# Patient Record
Sex: Male | Born: 1960 | Race: White | Hispanic: No | Marital: Single | Smoking: Former smoker
Health system: Southern US, Community
[De-identification: ages and names within clinical notes are randomized; demographics above are authoritative.]

## PROBLEM LIST (undated history)

## (undated) DIAGNOSIS — I10 Essential (primary) hypertension: Secondary | ICD-10-CM

## (undated) DIAGNOSIS — R7989 Other specified abnormal findings of blood chemistry: Secondary | ICD-10-CM

## (undated) DIAGNOSIS — Z9109 Other allergy status, other than to drugs and biological substances: Secondary | ICD-10-CM

## (undated) DIAGNOSIS — E785 Hyperlipidemia, unspecified: Secondary | ICD-10-CM

## (undated) DIAGNOSIS — I2510 Atherosclerotic heart disease of native coronary artery without angina pectoris: Secondary | ICD-10-CM

## (undated) DIAGNOSIS — J189 Pneumonia, unspecified organism: Secondary | ICD-10-CM

## (undated) DIAGNOSIS — F32A Depression, unspecified: Secondary | ICD-10-CM

## (undated) DIAGNOSIS — G4730 Sleep apnea, unspecified: Secondary | ICD-10-CM

## (undated) DIAGNOSIS — K219 Gastro-esophageal reflux disease without esophagitis: Secondary | ICD-10-CM

## (undated) DIAGNOSIS — N400 Enlarged prostate without lower urinary tract symptoms: Secondary | ICD-10-CM

## (undated) DIAGNOSIS — F419 Anxiety disorder, unspecified: Secondary | ICD-10-CM

## (undated) DIAGNOSIS — R0600 Dyspnea, unspecified: Secondary | ICD-10-CM

## (undated) DIAGNOSIS — F909 Attention-deficit hyperactivity disorder, unspecified type: Secondary | ICD-10-CM

## (undated) DIAGNOSIS — R609 Edema, unspecified: Secondary | ICD-10-CM

## (undated) DIAGNOSIS — I701 Atherosclerosis of renal artery: Secondary | ICD-10-CM

## (undated) DIAGNOSIS — I499 Cardiac arrhythmia, unspecified: Secondary | ICD-10-CM

## (undated) DIAGNOSIS — R51 Headache: Secondary | ICD-10-CM

## (undated) DIAGNOSIS — K589 Irritable bowel syndrome without diarrhea: Secondary | ICD-10-CM

## (undated) DIAGNOSIS — I219 Acute myocardial infarction, unspecified: Secondary | ICD-10-CM

## (undated) DIAGNOSIS — F329 Major depressive disorder, single episode, unspecified: Secondary | ICD-10-CM

## (undated) DIAGNOSIS — J3089 Other allergic rhinitis: Secondary | ICD-10-CM

## (undated) HISTORY — DX: Atherosclerotic heart disease of native coronary artery without angina pectoris: I25.10

## (undated) HISTORY — DX: Benign prostatic hyperplasia without lower urinary tract symptoms: N40.0

## (undated) HISTORY — DX: Irritable bowel syndrome, unspecified: K58.9

## (undated) HISTORY — DX: Other specified abnormal findings of blood chemistry: R79.89

## (undated) HISTORY — DX: Hyperlipidemia, unspecified: E78.5

## (undated) HISTORY — DX: Gastro-esophageal reflux disease without esophagitis: K21.9

## (undated) HISTORY — DX: Atherosclerosis of renal artery: I70.1

## (undated) HISTORY — PX: KNEE ARTHROSCOPY: SUR90

## (undated) HISTORY — DX: Edema, unspecified: R60.9

---

## 1998-07-04 ENCOUNTER — Ambulatory Visit (HOSPITAL_COMMUNITY): Admission: RE | Admit: 1998-07-04 | Discharge: 1998-07-04 | Payer: Self-pay | Admitting: Psychiatry

## 1998-09-05 ENCOUNTER — Emergency Department (HOSPITAL_COMMUNITY): Admission: EM | Admit: 1998-09-05 | Discharge: 1998-09-05 | Payer: Self-pay | Admitting: Emergency Medicine

## 1998-11-22 ENCOUNTER — Emergency Department (HOSPITAL_COMMUNITY): Admission: EM | Admit: 1998-11-22 | Discharge: 1998-11-22 | Payer: Self-pay | Admitting: *Deleted

## 1999-01-01 ENCOUNTER — Encounter: Admission: RE | Admit: 1999-01-01 | Discharge: 1999-04-01 | Payer: Self-pay

## 1999-01-07 ENCOUNTER — Encounter: Payer: Self-pay | Admitting: Neurosurgery

## 1999-01-07 ENCOUNTER — Ambulatory Visit (HOSPITAL_COMMUNITY): Admission: RE | Admit: 1999-01-07 | Discharge: 1999-01-07 | Payer: Self-pay | Admitting: Neurosurgery

## 1999-01-27 ENCOUNTER — Emergency Department (HOSPITAL_COMMUNITY): Admission: EM | Admit: 1999-01-27 | Discharge: 1999-01-27 | Payer: Self-pay | Admitting: Emergency Medicine

## 1999-02-03 ENCOUNTER — Encounter: Admission: RE | Admit: 1999-02-03 | Discharge: 1999-04-01 | Payer: Self-pay | Admitting: Neurosurgery

## 2000-10-15 ENCOUNTER — Encounter: Payer: Self-pay | Admitting: Orthopedic Surgery

## 2000-10-15 ENCOUNTER — Inpatient Hospital Stay: Admission: RE | Admit: 2000-10-15 | Discharge: 2000-10-17 | Payer: Self-pay | Admitting: Orthopedic Surgery

## 2000-10-15 ENCOUNTER — Encounter: Payer: Self-pay | Admitting: Emergency Medicine

## 2000-10-21 ENCOUNTER — Encounter: Payer: Self-pay | Admitting: Specialist

## 2000-10-21 ENCOUNTER — Ambulatory Visit (HOSPITAL_COMMUNITY): Admission: RE | Admit: 2000-10-21 | Discharge: 2000-10-21 | Payer: Self-pay | Admitting: Specialist

## 2001-02-11 ENCOUNTER — Emergency Department (HOSPITAL_COMMUNITY): Admission: EM | Admit: 2001-02-11 | Discharge: 2001-02-11 | Payer: Self-pay | Admitting: Emergency Medicine

## 2001-02-11 ENCOUNTER — Encounter: Payer: Self-pay | Admitting: Emergency Medicine

## 2001-02-12 ENCOUNTER — Emergency Department (HOSPITAL_COMMUNITY): Admission: EM | Admit: 2001-02-12 | Discharge: 2001-02-12 | Payer: Self-pay | Admitting: Emergency Medicine

## 2001-02-17 ENCOUNTER — Emergency Department (HOSPITAL_COMMUNITY): Admission: EM | Admit: 2001-02-17 | Discharge: 2001-02-17 | Payer: Self-pay | Admitting: Emergency Medicine

## 2001-02-18 ENCOUNTER — Emergency Department (HOSPITAL_COMMUNITY): Admission: EM | Admit: 2001-02-18 | Discharge: 2001-02-18 | Payer: Self-pay | Admitting: Emergency Medicine

## 2001-02-19 ENCOUNTER — Emergency Department (HOSPITAL_COMMUNITY): Admission: EM | Admit: 2001-02-19 | Discharge: 2001-02-19 | Payer: Self-pay | Admitting: Emergency Medicine

## 2001-02-20 ENCOUNTER — Emergency Department (HOSPITAL_COMMUNITY): Admission: EM | Admit: 2001-02-20 | Discharge: 2001-02-20 | Payer: Self-pay | Admitting: Emergency Medicine

## 2001-03-18 ENCOUNTER — Emergency Department (HOSPITAL_COMMUNITY): Admission: EM | Admit: 2001-03-18 | Discharge: 2001-03-18 | Payer: Self-pay | Admitting: Emergency Medicine

## 2001-04-08 ENCOUNTER — Emergency Department (HOSPITAL_COMMUNITY): Admission: EM | Admit: 2001-04-08 | Discharge: 2001-04-08 | Payer: Self-pay | Admitting: Emergency Medicine

## 2001-06-12 ENCOUNTER — Emergency Department (HOSPITAL_COMMUNITY): Admission: EM | Admit: 2001-06-12 | Discharge: 2001-06-12 | Payer: Self-pay | Admitting: Emergency Medicine

## 2001-12-05 ENCOUNTER — Emergency Department (HOSPITAL_COMMUNITY): Admission: EM | Admit: 2001-12-05 | Discharge: 2001-12-05 | Payer: Self-pay | Admitting: Emergency Medicine

## 2001-12-07 ENCOUNTER — Emergency Department (HOSPITAL_COMMUNITY): Admission: EM | Admit: 2001-12-07 | Discharge: 2001-12-07 | Payer: Self-pay | Admitting: Emergency Medicine

## 2002-07-01 ENCOUNTER — Emergency Department (HOSPITAL_COMMUNITY): Admission: EM | Admit: 2002-07-01 | Discharge: 2002-07-02 | Payer: Self-pay | Admitting: Emergency Medicine

## 2002-07-03 ENCOUNTER — Emergency Department (HOSPITAL_COMMUNITY): Admission: EM | Admit: 2002-07-03 | Discharge: 2002-07-03 | Payer: Self-pay

## 2002-07-04 ENCOUNTER — Encounter: Payer: Self-pay | Admitting: Internal Medicine

## 2002-07-05 ENCOUNTER — Inpatient Hospital Stay (HOSPITAL_COMMUNITY): Admission: EM | Admit: 2002-07-05 | Discharge: 2002-07-10 | Payer: Self-pay | Admitting: Emergency Medicine

## 2002-07-05 ENCOUNTER — Encounter: Payer: Self-pay | Admitting: *Deleted

## 2002-07-15 ENCOUNTER — Encounter: Payer: Self-pay | Admitting: Emergency Medicine

## 2002-07-15 ENCOUNTER — Emergency Department (HOSPITAL_COMMUNITY): Admission: EM | Admit: 2002-07-15 | Discharge: 2002-07-15 | Payer: Self-pay | Admitting: Emergency Medicine

## 2004-09-05 ENCOUNTER — Ambulatory Visit: Payer: Self-pay | Admitting: *Deleted

## 2004-10-20 ENCOUNTER — Ambulatory Visit: Payer: Self-pay | Admitting: Nurse Practitioner

## 2004-10-23 ENCOUNTER — Ambulatory Visit: Payer: Self-pay | Admitting: Nurse Practitioner

## 2004-12-11 ENCOUNTER — Ambulatory Visit: Payer: Self-pay | Admitting: Family Medicine

## 2005-02-16 ENCOUNTER — Encounter: Admission: RE | Admit: 2005-02-16 | Discharge: 2005-02-16 | Payer: Self-pay | Admitting: Occupational Medicine

## 2005-03-20 ENCOUNTER — Ambulatory Visit: Payer: Self-pay | Admitting: Nurse Practitioner

## 2005-07-27 ENCOUNTER — Emergency Department (HOSPITAL_COMMUNITY): Admission: EM | Admit: 2005-07-27 | Discharge: 2005-07-27 | Payer: Self-pay | Admitting: Family Medicine

## 2005-07-29 ENCOUNTER — Emergency Department (HOSPITAL_COMMUNITY): Admission: EM | Admit: 2005-07-29 | Discharge: 2005-07-29 | Payer: Self-pay | Admitting: Family Medicine

## 2006-01-16 ENCOUNTER — Emergency Department (HOSPITAL_COMMUNITY): Admission: EM | Admit: 2006-01-16 | Discharge: 2006-01-16 | Payer: Self-pay | Admitting: Emergency Medicine

## 2006-03-13 ENCOUNTER — Emergency Department (HOSPITAL_COMMUNITY): Admission: EM | Admit: 2006-03-13 | Discharge: 2006-03-13 | Payer: Self-pay | Admitting: Emergency Medicine

## 2007-09-05 ENCOUNTER — Inpatient Hospital Stay (HOSPITAL_COMMUNITY): Admission: EM | Admit: 2007-09-05 | Discharge: 2007-09-07 | Payer: Self-pay | Admitting: Emergency Medicine

## 2007-09-05 ENCOUNTER — Ambulatory Visit: Payer: Self-pay | Admitting: Internal Medicine

## 2008-04-18 ENCOUNTER — Ambulatory Visit: Payer: Self-pay | Admitting: Internal Medicine

## 2008-04-18 ENCOUNTER — Encounter (INDEPENDENT_AMBULATORY_CARE_PROVIDER_SITE_OTHER): Payer: Self-pay | Admitting: Family Medicine

## 2008-04-18 LAB — CONVERTED CEMR LAB
ALT: 82 units/L — ABNORMAL HIGH (ref 0–53)
AST: 43 units/L — ABNORMAL HIGH (ref 0–37)
Albumin: 4.3 g/dL (ref 3.5–5.2)
Alkaline Phosphatase: 94 units/L (ref 39–117)
BUN: 15 mg/dL (ref 6–23)
Basophils Absolute: 0 10*3/uL (ref 0.0–0.1)
Basophils Relative: 1 % (ref 0–1)
CO2: 22 meq/L (ref 19–32)
Calcium: 10.1 mg/dL (ref 8.4–10.5)
Chloride: 99 meq/L (ref 96–112)
Creatinine, Ser: 1.07 mg/dL (ref 0.40–1.50)
Eosinophils Absolute: 0.2 10*3/uL (ref 0.0–0.7)
Eosinophils Relative: 3 % (ref 0–5)
Glucose, Bld: 606 mg/dL (ref 70–99)
HCT: 49.9 % (ref 39.0–52.0)
Hemoglobin: 16.9 g/dL (ref 13.0–17.0)
Lymphocytes Relative: 34 % (ref 12–46)
Lymphs Abs: 2.2 10*3/uL (ref 0.7–4.0)
MCHC: 33.9 g/dL (ref 30.0–36.0)
MCV: 91.6 fL (ref 78.0–100.0)
Microalb, Ur: 0.2 mg/dL (ref 0.00–1.89)
Monocytes Absolute: 0.3 10*3/uL (ref 0.1–1.0)
Monocytes Relative: 5 % (ref 3–12)
Neutro Abs: 3.7 10*3/uL (ref 1.7–7.7)
Neutrophils Relative %: 57 % (ref 43–77)
PSA: 0.44 ng/mL (ref 0.10–4.00)
Platelets: 218 10*3/uL (ref 150–400)
Potassium: 4.9 meq/L (ref 3.5–5.3)
RBC: 5.45 M/uL (ref 4.22–5.81)
RDW: 13.5 % (ref 11.5–15.5)
Sodium: 135 meq/L (ref 135–145)
TSH: 1.282 microintl units/mL (ref 0.350–5.50)
Total Bilirubin: 0.5 mg/dL (ref 0.3–1.2)
Total Protein: 7.5 g/dL (ref 6.0–8.3)
WBC: 6.5 10*3/uL (ref 4.0–10.5)

## 2008-04-19 ENCOUNTER — Encounter (INDEPENDENT_AMBULATORY_CARE_PROVIDER_SITE_OTHER): Payer: Self-pay | Admitting: Family Medicine

## 2008-04-19 LAB — CONVERTED CEMR LAB
HCV Ab: NEGATIVE
Hep A IgM: NEGATIVE
Hep B C IgM: NEGATIVE
Hepatitis B Surface Ag: NEGATIVE

## 2008-04-20 ENCOUNTER — Ambulatory Visit: Payer: Self-pay | Admitting: Family Medicine

## 2008-04-23 ENCOUNTER — Ambulatory Visit: Payer: Self-pay | Admitting: Internal Medicine

## 2008-04-27 ENCOUNTER — Ambulatory Visit: Payer: Self-pay | Admitting: Internal Medicine

## 2008-07-11 ENCOUNTER — Encounter (INDEPENDENT_AMBULATORY_CARE_PROVIDER_SITE_OTHER): Payer: Self-pay | Admitting: Family Medicine

## 2008-07-11 ENCOUNTER — Ambulatory Visit: Payer: Self-pay | Admitting: Internal Medicine

## 2008-07-11 LAB — CONVERTED CEMR LAB
ALT: 50 units/L (ref 0–53)
AST: 24 units/L (ref 0–37)
Albumin: 4 g/dL (ref 3.5–5.2)
Alkaline Phosphatase: 69 units/L (ref 39–117)
BUN: 17 mg/dL (ref 6–23)
CO2: 22 meq/L (ref 19–32)
Calcium: 9.7 mg/dL (ref 8.4–10.5)
Chloride: 102 meq/L (ref 96–112)
Cholesterol: 186 mg/dL (ref 0–200)
Creatinine, Ser: 0.86 mg/dL (ref 0.40–1.50)
Glucose, Bld: 285 mg/dL — ABNORMAL HIGH (ref 70–99)
HDL: 31 mg/dL — ABNORMAL LOW (ref >39)
Potassium: 4.7 meq/L (ref 3.5–5.3)
Sodium: 140 meq/L (ref 135–145)
Total Bilirubin: 0.5 mg/dL (ref 0.3–1.2)
Total CHOL/HDL Ratio: 6
Total Protein: 6.7 g/dL (ref 6.0–8.3)
Triglycerides: 433 mg/dL — ABNORMAL HIGH (ref <150)

## 2008-11-03 ENCOUNTER — Emergency Department (HOSPITAL_COMMUNITY): Admission: EM | Admit: 2008-11-03 | Discharge: 2008-11-03 | Payer: Self-pay | Admitting: Emergency Medicine

## 2009-08-20 ENCOUNTER — Ambulatory Visit: Payer: Self-pay | Admitting: Internal Medicine

## 2009-08-20 LAB — CONVERTED CEMR LAB
ALT: 45 units/L (ref 0–53)
AST: 23 units/L (ref 0–37)
Albumin: 4.3 g/dL (ref 3.5–5.2)
Alkaline Phosphatase: 79 units/L (ref 39–117)
BUN: 16 mg/dL (ref 6–23)
CO2: 23 meq/L (ref 19–32)
Calcium: 10.1 mg/dL (ref 8.4–10.5)
Chloride: 99 meq/L (ref 96–112)
Cholesterol: 201 mg/dL — ABNORMAL HIGH (ref 0–200)
Creatinine, Ser: 1 mg/dL (ref 0.40–1.50)
Direct LDL: 93 mg/dL
Glucose, Bld: 477 mg/dL — ABNORMAL HIGH (ref 70–99)
HDL: 31 mg/dL — ABNORMAL LOW (ref >39)
Potassium: 4.6 meq/L (ref 3.5–5.3)
Sodium: 137 meq/L (ref 135–145)
Total Bilirubin: 0.5 mg/dL (ref 0.3–1.2)
Total CHOL/HDL Ratio: 6.5
Total Protein: 6.9 g/dL (ref 6.0–8.3)
Triglycerides: 629 mg/dL — ABNORMAL HIGH (ref <150)

## 2009-09-04 ENCOUNTER — Ambulatory Visit: Payer: Self-pay | Admitting: Internal Medicine

## 2009-09-05 ENCOUNTER — Ambulatory Visit: Payer: Self-pay | Admitting: Family Medicine

## 2009-09-05 ENCOUNTER — Encounter (INDEPENDENT_AMBULATORY_CARE_PROVIDER_SITE_OTHER): Payer: Self-pay | Admitting: *Deleted

## 2009-09-05 LAB — CONVERTED CEMR LAB
Microalb, Ur: 1.11 mg/dL (ref 0.00–1.89)
PSA: 0.59 ng/mL (ref 0.10–4.00)

## 2009-09-25 ENCOUNTER — Ambulatory Visit: Payer: Self-pay | Admitting: Internal Medicine

## 2009-10-14 ENCOUNTER — Ambulatory Visit: Payer: Self-pay | Admitting: Internal Medicine

## 2009-10-14 ENCOUNTER — Encounter (INDEPENDENT_AMBULATORY_CARE_PROVIDER_SITE_OTHER): Payer: Self-pay | Admitting: Internal Medicine

## 2009-10-14 LAB — CONVERTED CEMR LAB
Basophils Absolute: 0.1 10*3/uL (ref 0.0–0.1)
Basophils Relative: 1 % (ref 0–1)
Eosinophils Absolute: 1.2 10*3/uL — ABNORMAL HIGH (ref 0.0–0.7)
Eosinophils Relative: 12 % — ABNORMAL HIGH (ref 0–5)
HCT: 49.1 % (ref 39.0–52.0)
Hemoglobin: 16.3 g/dL (ref 13.0–17.0)
Lymphocytes Relative: 31 % (ref 12–46)
Lymphs Abs: 3.1 10*3/uL (ref 0.7–4.0)
MCHC: 33.2 g/dL (ref 30.0–36.0)
MCV: 90.4 fL (ref 78.0–100.0)
Monocytes Absolute: 0.7 10*3/uL (ref 0.1–1.0)
Monocytes Relative: 7 % (ref 3–12)
Neutro Abs: 4.8 10*3/uL (ref 1.7–7.7)
Neutrophils Relative %: 49 % (ref 43–77)
Platelets: 265 10*3/uL (ref 150–400)
RBC: 5.43 M/uL (ref 4.22–5.81)
RDW: 13.7 % (ref 11.5–15.5)
WBC: 9.9 10*3/uL (ref 4.0–10.5)

## 2009-10-15 ENCOUNTER — Ambulatory Visit: Payer: Self-pay | Admitting: Internal Medicine

## 2009-10-18 ENCOUNTER — Ambulatory Visit (HOSPITAL_COMMUNITY): Admission: RE | Admit: 2009-10-18 | Discharge: 2009-10-18 | Payer: Self-pay | Admitting: Internal Medicine

## 2009-11-05 ENCOUNTER — Ambulatory Visit: Payer: Self-pay | Admitting: Internal Medicine

## 2009-11-18 ENCOUNTER — Ambulatory Visit: Payer: Self-pay | Admitting: Internal Medicine

## 2009-12-02 ENCOUNTER — Ambulatory Visit: Payer: Self-pay | Admitting: Internal Medicine

## 2009-12-04 ENCOUNTER — Telehealth (INDEPENDENT_AMBULATORY_CARE_PROVIDER_SITE_OTHER): Payer: Self-pay | Admitting: *Deleted

## 2009-12-12 ENCOUNTER — Ambulatory Visit: Payer: Self-pay | Admitting: Internal Medicine

## 2009-12-13 IMAGING — CT CT HEAD W/O CM
1 series · 16 of 30 positions shown, 20 images · non-contrast
Comparison: None.

CLINICAL DATA: Severe headache with nausea.  May be related to
medication.  History of hypertension.  History of diabetes
mellitus, Type II.

CT HEAD WITHOUT CONTRAST
TECHNIQUE: Contiguous axial images were obtained from the base of
the skull through the vertex without contrast.

[Series 2: head routine 4.8 h37s · axial · 0.43mm/px · z∈[-167,-30]mm · 16 of 30 slices shown, 20 images]
[im 2/30  brain]
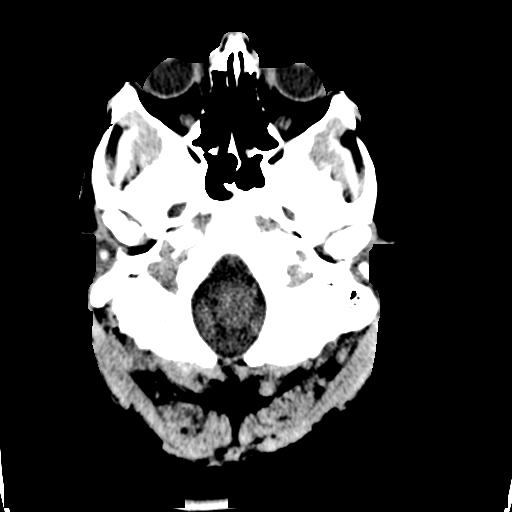
[im 2/30  bone]
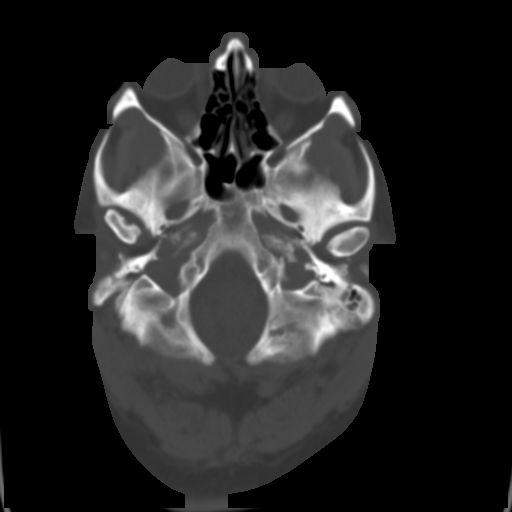
[im 4/30  brain]
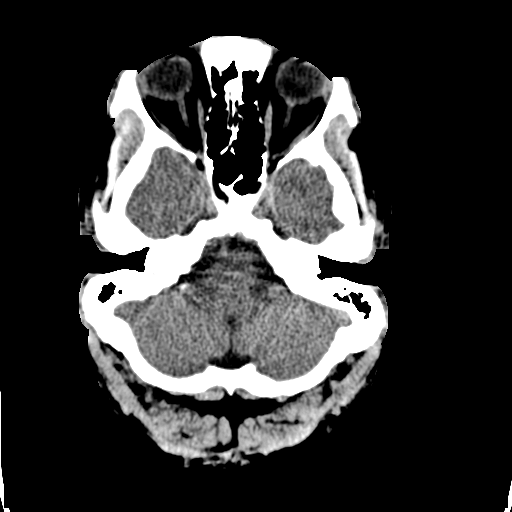
[im 6/30  brain]
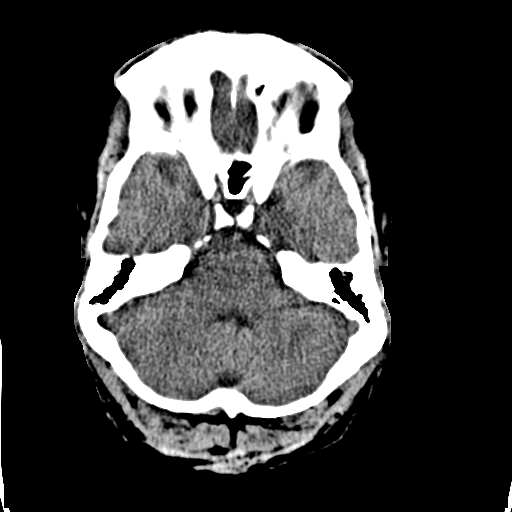
[im 8/30  brain]
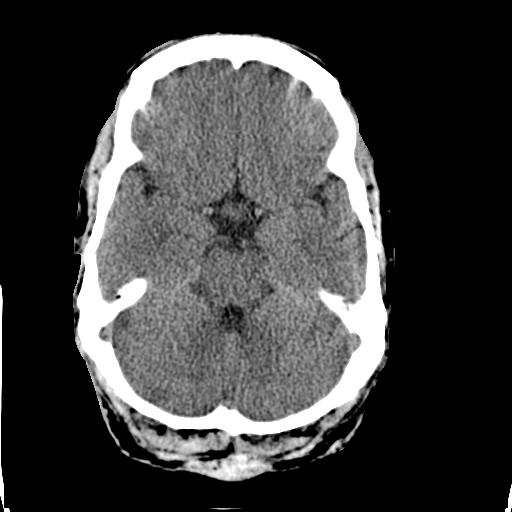
[im 9/30  brain]
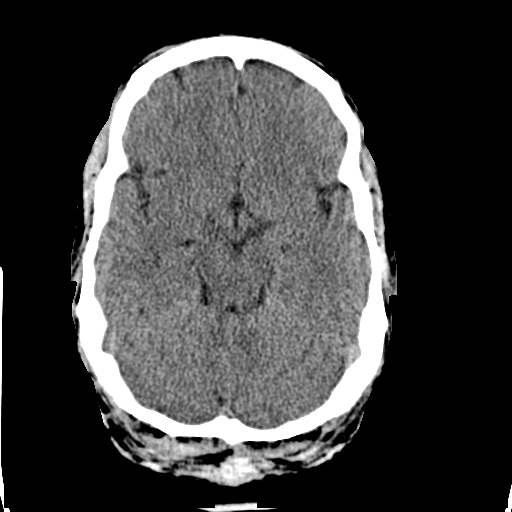
[im 9/30  bone]
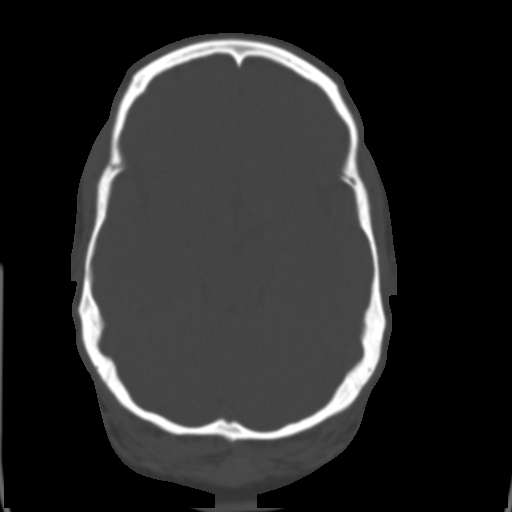
[im 11/30  brain]
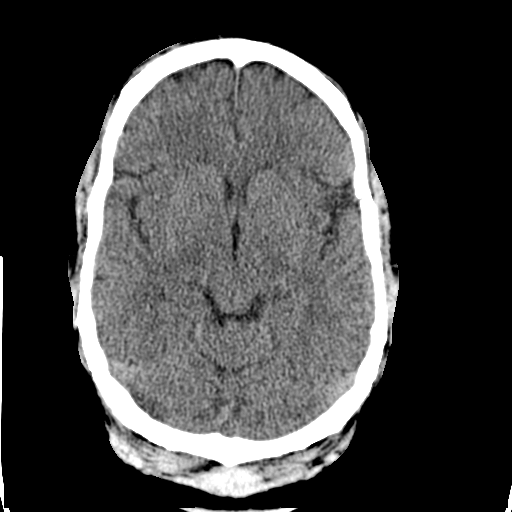
[im 13/30  brain]
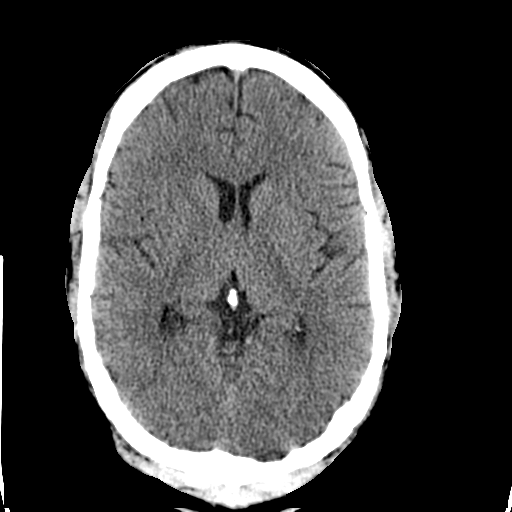
[im 15/30  brain]
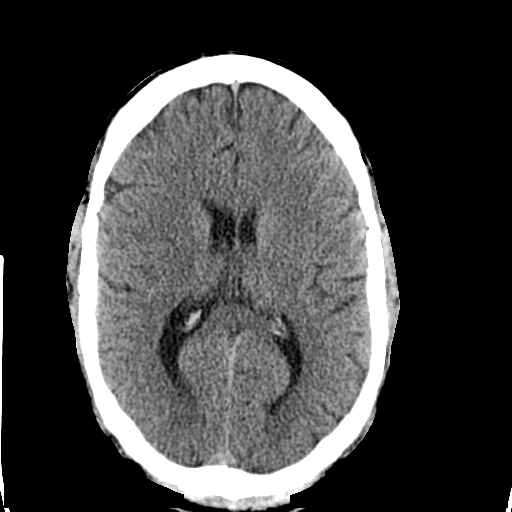
[im 16/30  brain]
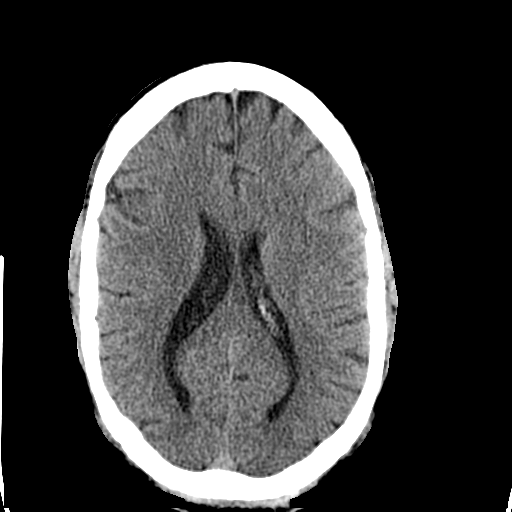
[im 16/30  bone]
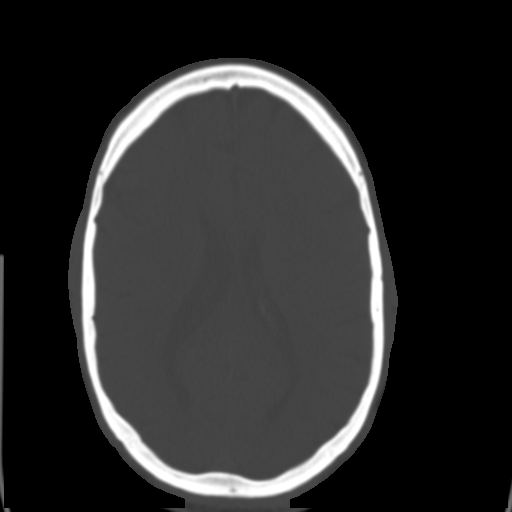
[im 18/30  brain]
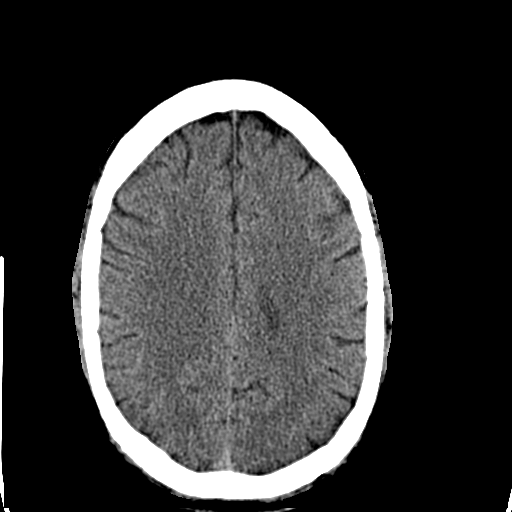
[im 20/30  brain]
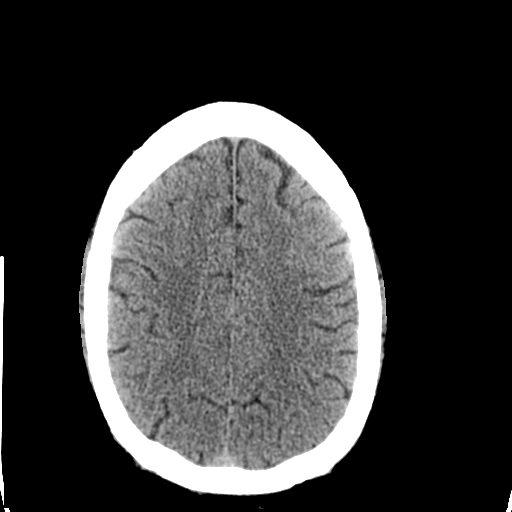
[im 22/30  brain]
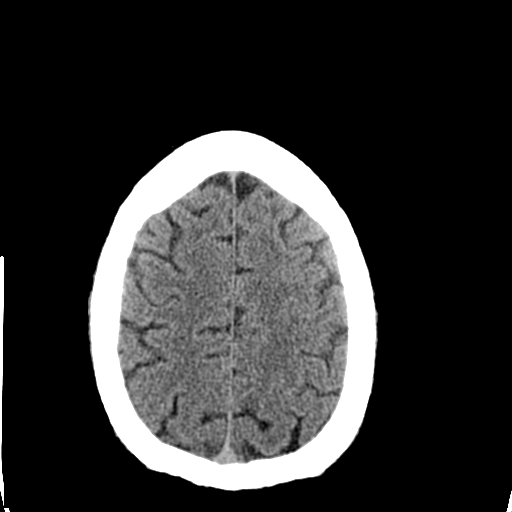
[im 23/30  brain]
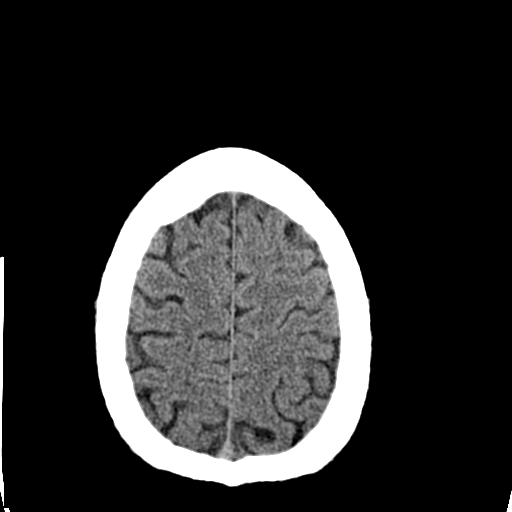
[im 23/30  bone]
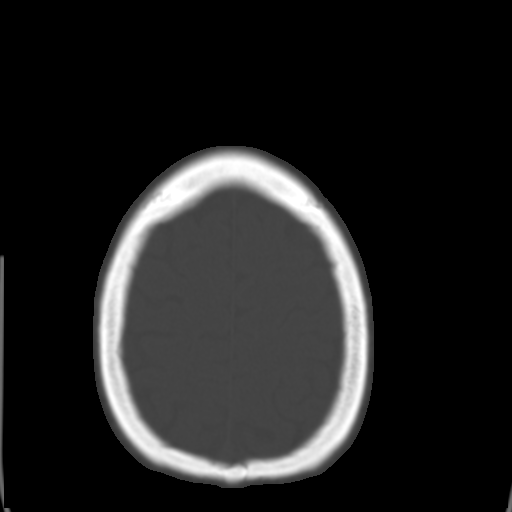
[im 25/30  brain]
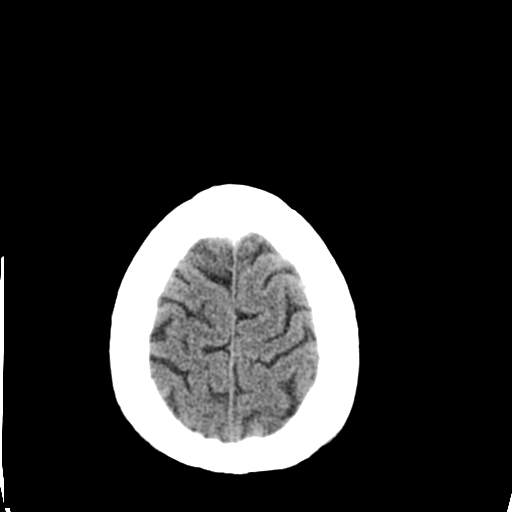
[im 27/30  brain]
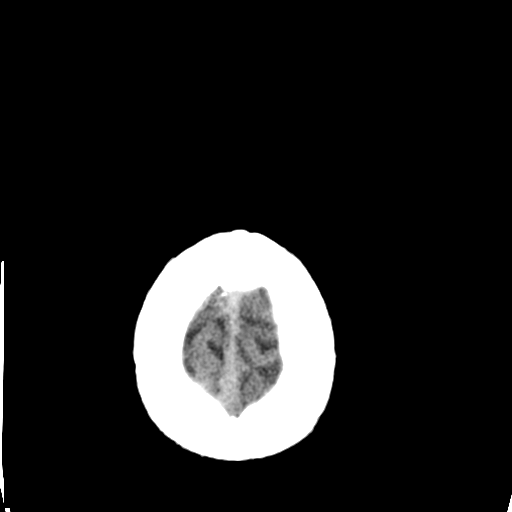
[im 29/30  brain]
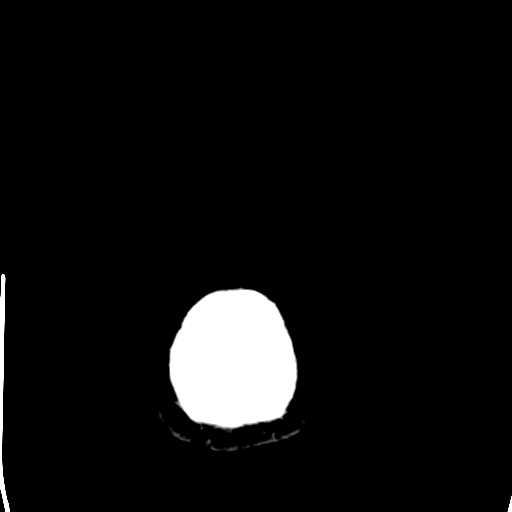

[16 of 30 positions shown; findings below may reference images not displayed]

FINDINGS: There is no evidence for acute infarction, intracranial
hemorrhage, mass lesion, hydrocephalus, or extra-axial fluid. There
is no atrophy or white matter disease.  Calvarium is intact.
Sinuses and mastoids are clear.
IMPRESSION: Negative.

## 2009-12-26 ENCOUNTER — Ambulatory Visit: Payer: Self-pay | Admitting: Internal Medicine

## 2010-01-17 ENCOUNTER — Ambulatory Visit: Payer: Self-pay | Admitting: Internal Medicine

## 2010-01-20 ENCOUNTER — Ambulatory Visit: Payer: Self-pay | Admitting: Internal Medicine

## 2010-01-20 ENCOUNTER — Encounter (INDEPENDENT_AMBULATORY_CARE_PROVIDER_SITE_OTHER): Payer: Self-pay | Admitting: Family Medicine

## 2010-01-20 LAB — CONVERTED CEMR LAB
ALT: 49 units/L (ref 0–53)
AST: 24 units/L (ref 0–37)
Albumin: 4.4 g/dL (ref 3.5–5.2)
Alkaline Phosphatase: 59 units/L (ref 39–117)
Bilirubin, Direct: 0.1 mg/dL (ref 0.0–0.3)
Cholesterol: 222 mg/dL — ABNORMAL HIGH (ref 0–200)
HDL: 29 mg/dL — ABNORMAL LOW (ref >39)
Indirect Bilirubin: 0.2 mg/dL (ref 0.0–0.9)
LDL Cholesterol: 130 mg/dL — ABNORMAL HIGH (ref 0–99)
Total Bilirubin: 0.3 mg/dL (ref 0.3–1.2)
Total CHOL/HDL Ratio: 7.7
Total Protein: 6.9 g/dL (ref 6.0–8.3)
Triglycerides: 313 mg/dL — ABNORMAL HIGH (ref <150)
VLDL: 63 mg/dL — ABNORMAL HIGH (ref 0–40)

## 2010-01-23 ENCOUNTER — Ambulatory Visit: Payer: Self-pay | Admitting: Internal Medicine

## 2010-01-23 LAB — CONVERTED CEMR LAB: Testosterone: 244.33 ng/dL — ABNORMAL LOW (ref 350–890)

## 2010-02-07 ENCOUNTER — Ambulatory Visit: Payer: Self-pay | Admitting: Internal Medicine

## 2010-04-03 ENCOUNTER — Ambulatory Visit: Payer: Self-pay | Admitting: Family Medicine

## 2010-04-22 ENCOUNTER — Ambulatory Visit: Payer: Self-pay | Admitting: Family Medicine

## 2010-05-12 ENCOUNTER — Telehealth (INDEPENDENT_AMBULATORY_CARE_PROVIDER_SITE_OTHER): Payer: Self-pay | Admitting: *Deleted

## 2010-05-12 ENCOUNTER — Ambulatory Visit (HOSPITAL_BASED_OUTPATIENT_CLINIC_OR_DEPARTMENT_OTHER): Admission: RE | Admit: 2010-05-12 | Discharge: 2010-05-12 | Payer: Self-pay | Admitting: Family Medicine

## 2010-05-28 ENCOUNTER — Emergency Department (HOSPITAL_COMMUNITY): Admission: EM | Admit: 2010-05-28 | Discharge: 2010-05-28 | Payer: Self-pay | Admitting: Family Medicine

## 2010-05-29 ENCOUNTER — Ambulatory Visit: Payer: Self-pay | Admitting: Internal Medicine

## 2010-06-17 ENCOUNTER — Encounter (INDEPENDENT_AMBULATORY_CARE_PROVIDER_SITE_OTHER): Payer: Self-pay | Admitting: Family Medicine

## 2010-06-17 ENCOUNTER — Ambulatory Visit: Payer: Self-pay | Admitting: Internal Medicine

## 2010-06-17 LAB — CONVERTED CEMR LAB
ALT: 24 units/L (ref 0–53)
AST: 16 units/L (ref 0–37)
Albumin: 4.5 g/dL (ref 3.5–5.2)
Alkaline Phosphatase: 58 units/L (ref 39–117)
BUN: 20 mg/dL (ref 6–23)
CO2: 24 meq/L (ref 19–32)
Calcium: 10 mg/dL (ref 8.4–10.5)
Chloride: 101 meq/L (ref 96–112)
Cholesterol: 198 mg/dL (ref 0–200)
Creatinine, Ser: 1.05 mg/dL (ref 0.40–1.50)
Glucose, Bld: 133 mg/dL — ABNORMAL HIGH (ref 70–99)
HDL: 30 mg/dL — ABNORMAL LOW (ref >39)
LDL Cholesterol: 108 mg/dL — ABNORMAL HIGH (ref 0–99)
Potassium: 4.7 meq/L (ref 3.5–5.3)
Sodium: 138 meq/L (ref 135–145)
Total Bilirubin: 0.3 mg/dL (ref 0.3–1.2)
Total CHOL/HDL Ratio: 6.6
Total Protein: 7.3 g/dL (ref 6.0–8.3)
Triglycerides: 298 mg/dL — ABNORMAL HIGH (ref <150)
VLDL: 60 mg/dL — ABNORMAL HIGH (ref 0–40)

## 2010-06-18 ENCOUNTER — Ambulatory Visit (HOSPITAL_BASED_OUTPATIENT_CLINIC_OR_DEPARTMENT_OTHER): Admission: RE | Admit: 2010-06-18 | Discharge: 2010-06-18 | Payer: Self-pay | Admitting: Family Medicine

## 2010-06-21 ENCOUNTER — Ambulatory Visit: Payer: Self-pay | Admitting: Internal Medicine

## 2010-07-01 ENCOUNTER — Ambulatory Visit: Payer: Self-pay | Admitting: Internal Medicine

## 2010-07-24 ENCOUNTER — Ambulatory Visit: Payer: Self-pay | Admitting: Family Medicine

## 2010-07-29 ENCOUNTER — Ambulatory Visit: Payer: Self-pay | Admitting: Family Medicine

## 2010-07-29 LAB — CONVERTED CEMR LAB: Testosterone: 247.19 ng/dL — ABNORMAL LOW (ref 350–890)

## 2010-08-07 ENCOUNTER — Ambulatory Visit: Payer: Self-pay | Admitting: Internal Medicine

## 2010-08-21 ENCOUNTER — Ambulatory Visit: Payer: Self-pay | Admitting: Internal Medicine

## 2010-08-21 ENCOUNTER — Encounter (INDEPENDENT_AMBULATORY_CARE_PROVIDER_SITE_OTHER): Payer: Self-pay | Admitting: Family Medicine

## 2010-08-21 LAB — CONVERTED CEMR LAB: Testosterone: 119.43 ng/dL — ABNORMAL LOW (ref 350–890)

## 2010-08-22 ENCOUNTER — Encounter (INDEPENDENT_AMBULATORY_CARE_PROVIDER_SITE_OTHER): Payer: Self-pay | Admitting: Family Medicine

## 2010-08-22 LAB — CONVERTED CEMR LAB
FSH: 1.4 milliintl units/mL (ref 1.4–18.1)
LH: 1.7 milliintl units/mL (ref 1.5–9.3)

## 2010-09-04 ENCOUNTER — Encounter (INDEPENDENT_AMBULATORY_CARE_PROVIDER_SITE_OTHER): Payer: Self-pay | Admitting: Family Medicine

## 2010-09-04 ENCOUNTER — Ambulatory Visit: Payer: Self-pay | Admitting: Internal Medicine

## 2010-09-04 LAB — CONVERTED CEMR LAB
Sex Hormone Binding: 23 nmol/L (ref 13–71)
Testosterone Free: 30.3 pg/mL — ABNORMAL LOW (ref 47.0–244.0)
Testosterone-% Free: 2.3 % (ref 1.6–2.9)
Testosterone: 134.21 ng/dL — ABNORMAL LOW (ref 350–890)

## 2010-09-18 ENCOUNTER — Encounter (INDEPENDENT_AMBULATORY_CARE_PROVIDER_SITE_OTHER): Payer: Self-pay | Admitting: Family Medicine

## 2010-09-18 LAB — CONVERTED CEMR LAB
Ferritin: 54 ng/mL (ref 22–322)
Iron: 50 ug/dL (ref 42–165)
Saturation Ratios: 15 % — ABNORMAL LOW (ref 20–55)
TIBC: 336 ug/dL (ref 215–435)
UIBC: 286 ug/dL

## 2010-10-03 ENCOUNTER — Emergency Department (HOSPITAL_COMMUNITY): Admission: EM | Admit: 2010-10-03 | Discharge: 2010-10-04 | Payer: Self-pay | Admitting: Emergency Medicine

## 2010-10-03 ENCOUNTER — Emergency Department (HOSPITAL_COMMUNITY): Admission: EM | Admit: 2010-10-03 | Discharge: 2010-10-03 | Payer: Self-pay | Admitting: Family Medicine

## 2010-11-03 ENCOUNTER — Encounter (INDEPENDENT_AMBULATORY_CARE_PROVIDER_SITE_OTHER): Payer: Self-pay | Admitting: Internal Medicine

## 2010-11-03 LAB — CONVERTED CEMR LAB: Testosterone: 622.69 ng/dL (ref 250–890)

## 2010-11-11 ENCOUNTER — Encounter (INDEPENDENT_AMBULATORY_CARE_PROVIDER_SITE_OTHER): Payer: Self-pay | Admitting: *Deleted

## 2010-11-11 LAB — CONVERTED CEMR LAB: Microalb, Ur: 0.5 mg/dL (ref 0.00–1.89)

## 2010-11-24 ENCOUNTER — Ambulatory Visit: Payer: Self-pay | Admitting: Gastroenterology

## 2010-12-09 ENCOUNTER — Encounter: Payer: Self-pay | Admitting: Internal Medicine

## 2010-12-11 ENCOUNTER — Ambulatory Visit: Payer: Self-pay | Admitting: Internal Medicine

## 2010-12-12 ENCOUNTER — Ambulatory Visit: Payer: Self-pay | Admitting: Internal Medicine

## 2011-01-06 ENCOUNTER — Ambulatory Visit
Admission: RE | Admit: 2011-01-06 | Discharge: 2011-01-06 | Payer: Self-pay | Source: Home / Self Care | Attending: Gastroenterology | Admitting: Gastroenterology

## 2011-01-18 ENCOUNTER — Encounter: Payer: Self-pay | Admitting: Internal Medicine

## 2011-01-20 ENCOUNTER — Encounter (INDEPENDENT_AMBULATORY_CARE_PROVIDER_SITE_OTHER): Payer: Self-pay | Admitting: *Deleted

## 2011-01-20 LAB — CONVERTED CEMR LAB
ALT: 32 units/L (ref 0–53)
AST: 20 units/L (ref 0–37)
Albumin: 4.6 g/dL (ref 3.5–5.2)
Alkaline Phosphatase: 45 units/L (ref 39–117)
BUN: 15 mg/dL (ref 6–23)
Basophils Absolute: 0.1 10*3/uL (ref 0.0–0.1)
Basophils Relative: 1 % (ref 0–1)
CO2: 25 meq/L (ref 19–32)
Calcium: 9.8 mg/dL (ref 8.4–10.5)
Chloride: 99 meq/L (ref 96–112)
Creatinine, Ser: 0.91 mg/dL (ref 0.40–1.50)
Eosinophils Absolute: 0.3 10*3/uL (ref 0.0–0.7)
Eosinophils Relative: 4 % (ref 0–5)
Glucose, Bld: 213 mg/dL — ABNORMAL HIGH (ref 70–99)
HCT: 48.5 % (ref 39.0–52.0)
Hemoglobin: 16 g/dL (ref 13.0–17.0)
Lymphocytes Relative: 34 % (ref 12–46)
Lymphs Abs: 3.2 10*3/uL (ref 0.7–4.0)
MCHC: 33 g/dL (ref 30.0–36.0)
MCV: 87.5 fL (ref 78.0–100.0)
Monocytes Absolute: 0.7 10*3/uL (ref 0.1–1.0)
Monocytes Relative: 7 % (ref 3–12)
Neutro Abs: 4.9 10*3/uL (ref 1.7–7.7)
Neutrophils Relative %: 53 % (ref 43–77)
Platelets: 280 10*3/uL (ref 150–400)
Potassium: 4.3 meq/L (ref 3.5–5.3)
RBC: 5.54 M/uL (ref 4.22–5.81)
RDW: 14.1 % (ref 11.5–15.5)
Sodium: 138 meq/L (ref 135–145)
TSH: 3.171 microintl units/mL (ref 0.350–4.500)
Total Bilirubin: 0.3 mg/dL (ref 0.3–1.2)
Total Protein: 7.2 g/dL (ref 6.0–8.3)
Vit D, 25-Hydroxy: 32 ng/mL (ref 30–89)
Vitamin B-12: 363 pg/mL (ref 211–911)
WBC: 9.2 10*3/uL (ref 4.0–10.5)

## 2011-01-27 NOTE — Progress Notes (Signed)
  Phone Note Call from Patient Call back at [facility] Phone [phone]   Summary of Call: WHEN SHE COMES BACK IN OFFICE PLEASE CALL AS SOON AS POSIBLE PHONE  [phone] Initial call taken by: Arta Bruce,  December 04, 2009 10:16 AM  Follow-up for Phone Call        PHONE MESSAGE PRINTED, GIVEN TO AMANDA Follow-up by: Arta Bruce,  December 04, 2009 10:17 AM

## 2011-01-27 NOTE — Progress Notes (Signed)
Summary: triage/headache/upset stomach  Phone Note Call from Patient   Caller: Patient Reason for Call: Talk to Nurse Summary of Call: patient states he has been having severe headaches and upset stomach..It usually begins when his blood sugar drops..He says this blood sugar is from 59 to 132.Marland KitchenHe says he isn't hungry and only eats 2 meals a day because of this..He started Januvia a couple of weeks ago at his 4/26 visit with Dr. Carolyne Fiscal, however says these problems started before then.Marland KitchenMarland KitchenThere is no note in patients chart of discussing these s/s with provider. Patient advised to eat small meals throughout the day...and to carry healthy snacks with him. Patient denies weakness or numbness on one side of the body..Patient advised to go to ED should he experience the worse headache of his life...weakness in one side. He has a lab appoinment 6/21 for lab and F/U appointmnt with Dr. Cranston Neighbor 7/5.Marland KitchenPatient to call for triage  appointment or cancellation...if needed.  Patient advised to that tyenol if not allergic for headache as ibuprofen may upset stomach..Take medication with food. Initial call taken by: Conchita Paris,  May 12, 2010 4:24 PM

## 2011-01-29 NOTE — Procedures (Signed)
Summary: Flexible Sigmoidoscopy  Patient: [name] [name] Note: All result statuses are Final unless otherwise noted.  Tests: (1) Flexible Sigmoidoscopy (FLX)  FLX Flexible Sigmoidoscopy                             DONE     [facility]     [address]     [place], [place]  [address]           FLEXIBLE SIGMOIDOSCOPY PROCEDURE REPORT           PATIENT:  [name] [name]  MR#:  [id number]     BIRTHDATE:  January 16, 1961, 49 yrs. old  GENDER:  male           ENDOSCOPIST:  Wilhemina Bonito. Eda Keys, MD     Referred by:  [facility],           PROCEDURE DATE:  12/09/2010     PROCEDURE:  Flexible Sigmoidoscopy, diagnostic     ASA CLASS:  Class II     INDICATIONS:  research - [place] IBS-D           MEDICATIONS:   Fentanyl 50 mcg IV, Versed 7 mg IV           DESCRIPTION OF PROCEDURE:   After the risks benefits and     alternatives of the procedure were thoroughly explained, informed     consent was obtained.  Digital rectal exam was performed and     revealed no abnormalities.   The [id number] endoscope     was introduced through the anus and advanced to the descending     colon, without limitations.  The quality of the prep was good.     The instrument was then slowly withdrawn as the mucosa was fully     examined.     <<PROCEDUREIMAGES>>           Moderate diverticulosis was found in the sigmoid colon.  The     examination was otherwise normal.   Retroflexed views in the     rectum revealed no abnormalities.    The scope was then withdrawn     from the patient and the procedure terminated.           COMPLICATIONS:  None           ENDOSCOPIC IMPRESSION:     1) Moderate diverticulosis in the sigmoid colon     2) Otherwise normal examination.     RECOMMENDATIONS:     1) Plans per research coordinator           ______________________________     Wilhemina Bonito. Eda Keys, MD           CC:  [facility] [employer]           n.     eSIGNED:   Wilhemina Bonito. Eda Keys at 12/09/2010 08:29  AM           [name], [id number]  Note: An exclamation mark (!) indicates a result that was not dispersed into the flowsheet. Document Creation Date: 12/09/2010 8:30 AM _______________________________________________________________________  (1) Order result status: Final Collection or observation date-time: 12/09/2010 08:21 Requested date-time:  Receipt date-time:  Reported date-time:  Referring Physician:   Ordering Physician: Fransico Setters [phone]) Specimen Source:  Source: Launa Grill Order Number: [id number] Lab site:

## 2011-02-03 ENCOUNTER — Ambulatory Visit (INDEPENDENT_AMBULATORY_CARE_PROVIDER_SITE_OTHER): Payer: Self-pay

## 2011-02-03 DIAGNOSIS — R197 Diarrhea, unspecified: Secondary | ICD-10-CM

## 2011-03-05 ENCOUNTER — Ambulatory Visit (INDEPENDENT_AMBULATORY_CARE_PROVIDER_SITE_OTHER): Payer: Self-pay

## 2011-03-05 DIAGNOSIS — R197 Diarrhea, unspecified: Secondary | ICD-10-CM

## 2011-03-06 ENCOUNTER — Encounter (INDEPENDENT_AMBULATORY_CARE_PROVIDER_SITE_OTHER): Payer: Self-pay | Admitting: *Deleted

## 2011-03-06 LAB — CONVERTED CEMR LAB: Testosterone: 204.14 ng/dL — ABNORMAL LOW (ref 250–890)

## 2011-03-09 LAB — GLUCOSE, CAPILLARY
Glucose-Capillary: 138 mg/dL — ABNORMAL HIGH (ref 70–99)
Glucose-Capillary: 171 mg/dL — ABNORMAL HIGH (ref 70–99)

## 2011-03-11 LAB — CBC
HCT: 46.4 % (ref 39.0–52.0)
Hemoglobin: 15.8 g/dL (ref 13.0–17.0)
MCH: 30.2 pg (ref 26.0–34.0)
MCHC: 34.1 g/dL (ref 30.0–36.0)
MCV: 88.5 fL (ref 78.0–100.0)
Platelets: 249 10*3/uL (ref 150–400)
RBC: 5.24 MIL/uL (ref 4.22–5.81)
RDW: 13.3 % (ref 11.5–15.5)
WBC: 12.3 10*3/uL — ABNORMAL HIGH (ref 4.0–10.5)

## 2011-03-11 LAB — URINALYSIS, ROUTINE W REFLEX MICROSCOPIC
Bilirubin Urine: NEGATIVE
Glucose, UA: NEGATIVE mg/dL
Hgb urine dipstick: NEGATIVE
Ketones, ur: NEGATIVE mg/dL
Nitrite: NEGATIVE
Protein, ur: NEGATIVE mg/dL
Specific Gravity, Urine: 1.02 (ref 1.005–1.030)
Urobilinogen, UA: 0.2 mg/dL (ref 0.0–1.0)
pH: 7 (ref 5.0–8.0)

## 2011-03-11 LAB — POCT I-STAT, CHEM 8
BUN: 16 mg/dL (ref 6–23)
Calcium, Ion: 1.1 mmol/L — ABNORMAL LOW (ref 1.12–1.32)
Chloride: 105 mEq/L (ref 96–112)
Creatinine, Ser: 1.1 mg/dL (ref 0.4–1.5)
Glucose, Bld: 135 mg/dL — ABNORMAL HIGH (ref 70–99)
HCT: 48 % (ref 39.0–52.0)
Hemoglobin: 16.3 g/dL (ref 13.0–17.0)
Potassium: 3.5 mEq/L (ref 3.5–5.1)
Sodium: 141 mEq/L (ref 135–145)
TCO2: 25 mmol/L (ref 0–100)

## 2011-03-11 LAB — DIFFERENTIAL
Basophils Absolute: 0 10*3/uL (ref 0.0–0.1)
Basophils Relative: 0 % (ref 0–1)
Eosinophils Absolute: 0.3 10*3/uL (ref 0.0–0.7)
Eosinophils Relative: 2 % (ref 0–5)
Lymphocytes Relative: 32 % (ref 12–46)
Lymphs Abs: 3.9 10*3/uL (ref 0.7–4.0)
Monocytes Absolute: 1 10*3/uL (ref 0.1–1.0)
Monocytes Relative: 8 % (ref 3–12)
Neutro Abs: 7.1 10*3/uL (ref 1.7–7.7)
Neutrophils Relative %: 58 % (ref 43–77)

## 2011-03-11 LAB — GLUCOSE, CAPILLARY: Glucose-Capillary: 141 mg/dL — ABNORMAL HIGH (ref 70–99)

## 2011-03-16 LAB — POCT I-STAT, CHEM 8
BUN: 24 mg/dL — ABNORMAL HIGH (ref 6–23)
Calcium, Ion: 1.12 mmol/L (ref 1.12–1.32)
Chloride: 107 mEq/L (ref 96–112)
Creatinine, Ser: 0.9 mg/dL (ref 0.4–1.5)
Glucose, Bld: 159 mg/dL — ABNORMAL HIGH (ref 70–99)
HCT: 52 % (ref 39.0–52.0)
Hemoglobin: 17.7 g/dL — ABNORMAL HIGH (ref 13.0–17.0)
Potassium: 4.1 mEq/L (ref 3.5–5.1)
Sodium: 140 mEq/L (ref 135–145)
TCO2: 23 mmol/L (ref 0–100)

## 2011-03-16 LAB — GLUCOSE, CAPILLARY: Glucose-Capillary: 169 mg/dL — ABNORMAL HIGH (ref 70–99)

## 2011-05-12 NOTE — Discharge Summary (Signed)
NAME:  [name] [name], [name] [name]              ACCOUNT NO.:  [id number]   MEDICAL RECORD NO.:  [id number]          PATIENT TYPE:  INP   LOCATION:  3705                         FACILITY:  [facility]   PHYSICIAN:  Bevelyn Buckles. Bensimhon, MDDATE OF BIRTH:  1961-12-10   DATE OF ADMISSION:  09/05/2007  DATE OF DISCHARGE:  09/07/2007                         DISCHARGE SUMMARY - REFERRING   DISCHARGE DIAGNOSES:  1. Chest discomfort of uncertain etiology.  2. Hypertension.  3. Hyperlipidemia.  4. Hyperglycemia with poorly controlled history of diabetes.  5. Remote tobacco use.  6. Medical noncompliance.   PROCEDURES PERFORMED:  Cardiac catheterization on September 06, 2007, by  Dr. Riley Kill.   BRIEF HISTORY:  Mr. College is a 50 year old male who was out cutting  limbs off of a tree and immediately afterward developed chest discomfort  radiating to the jaw, resolved with rest.  He got up to walk again, and  the symptoms returned.  He went to the emergency room and has not had  any further chest discomfort.  However, he still has residual jaw  discomfort.   PAST MEDICAL HISTORY:  Notable for diabetes, hypertension, strong family  history, poor compliance with diabetic control, hyperlipidemia,  depression, urinary hesitancy.   LABORATORY DATA:  Chest x-ray on the admission showed mild peribronchial  thickening without focal air space disease.  EKG showed electrical  artifact, normal sinus rhythm, normal axis, nonspecific ST-T wave  changes.  Admission hemoglobin and hematocrit of 16.4 and 47.2, normal  indices, platelets 255, WBCs 8.8.  Prior to discharge hemoglobin and  hematocrit were 14.8 and 43.2, normal indices, platelets 212, WBCs 7.7.  PTT 29, PT 13.2, D-dimer 0.22.  Admission sodium 134, potassium 3.6, BUN  12, creatinine 0.83, glucose was over 300.  Normal LFTs.  On the 9th,  glucose was 218, and on the 10th, glucose was 232 prior to discharge.  Potassium is 3.8, BUN 6, creatinine 0.82.  hemoglobin  A1c was elevated  at 13.1.  CK MB has relative indexes, and troponins were within normal  limits.  Fasting lipids showed a total cholesterol 210, triglycerides  384, HDL 31, LDL 102.  TSH was within normal limits at 4.313.  Cortisol  a.m. was 8.1.  Urinalysis was positive for 500 mg/dL of glucose.   HOSPITAL COURSE:  Mr. Vasco was placed on IV heparin.  With his chest  discomfort and multiple risk factors, it was felt that he would benefit  from cardiac catheterization.  He did rule out for myocardial  infarction.  On September 06, 2007, Dr. Riley Kill performed cardiac  catheterization.  He had a 20% to 30% proximal diagonal lesion, 30% to  40% proximal circumflex lesion, 20% proximal RCA lesion.  He had mild  global hypokinesis with an EF possibly of 45% to 55%, no MR.  Dr.  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  D-dimer was checked as for the  etiology of his chest discomfort and was unremarkable.  After review on  the 10th, Dr. Gala Romney spoke with Dr. Clelia Croft with recommendations in  regards to his diabetes  treatment.  Dr. Gala Romney felt that the patient  could be discharged home.   DISPOSITION:  The patient is discharged home.  He is asked to maintain a  low-salt, heart-healthy ADA diet.  Wound care and activities are  dictated per supplemental discharge sheet.  He received new  prescriptions for metformin/glipizide 500/5 mg b.i.d., aspirin 81 mg  daily, Coreg 6.25 mg b.i.d., lovastatin 20 mg at bedtime, lisinopril 10  mg daily.  He was asked to bring all sugar readings and medications to  all appointments.  He was asked to call Dr. Alver Fisher office for an  appointment next week, and he will see Dr. Gala Romney in the [place]  office in the next two to three weeks.  He will need blood work in six  to eight weeks in regards to FLP and LFTs since lovastatin was  initiated.   DISCHARGE TIME:  45 minutes.       Joellyn Rued, PA-C      Bevelyn Buckles. Bensimhon, MD  Electronically Signed    EW/MEDQ  D:  09/07/2007  T:  09/07/2007  Job:  [id number]   cc:   Kari Baars, M.D.

## 2011-05-12 NOTE — H&P (Signed)
NAME:  [name] [name], [name] [name]              ACCOUNT NO.:  [id number]   MEDICAL RECORD NO.:  [id number]          PATIENT TYPE:  EMS   LOCATION:  MAJO                         FACILITY:  [facility]   PHYSICIAN:  Bevelyn Buckles. Bensimhon, MDDATE OF BIRTH:  02-Mar-1961   DATE OF ADMISSION:  09/05/2007  DATE OF DISCHARGE:                              HISTORY & PHYSICAL   PRIMARY CARE PHYSICIAN:  Kari Baars, M.D., cardiologist.  He is new  to [facility].   REASON FOR ADMISSION:  Chest pain concerning for unstable angina.   HISTORY OF PRESENT ILLNESS:  [name] [name] is a 50 year old male with a  history of hypertension, hyperlipidemia, and diabetes as well as a  strong family history of coronary artery disease.  He has no known  personal history of coronary artery disease.  He has never had a stress  test or cardiac catheterization.  He was previously followed by Dr. Eric Form in the [facility], however, over the past year or so  he has not had any medical insurance and has not followed with Dr. Clelia Croft  or taken any of his medications.   Tonight, he was outside cutting some limbs off his neighbors tree.  After cutting the limbs, he walked around to talk to his neighbor and  developed chest pressure radiating to his jaw.  He sat down and talked  to his neighbor for a while and felt much better.  He got back up and  started cleaning up and developed recurrent chest pressure radiating to  the jaw.  He was brought to the emergency room.  He was treated with  baby aspirin.  On arrival, he was chest pain free but continues to have  some mild residual jaw pain.  EKG was normal and first set of cardiac  markers were normal.  His ISTAT chemistries showed a sodium of 129 and  potassium of 7.4, however, it was thought that this was hemolyzed and is  being repeated.  His non-fasting glucose was 386.   REVIEW OF SYSTEMS:  He denies fevers, chills, nausea, vomiting,  congestive heart failure.   He has noticed significant urinary hesitancy.  He denies any bright red blood per rectum or melena.  The remainder of  review of systems is negative except for HPI and problem list.   PROBLEM LIST:  1. Hypertension x3 years.  2. Diabetes x3 years.  3. Hyperlipidemia.  4. Medical noncompliance due to insurance reasons.  5. Depression.  6. History of tobacco use, quit in 1993.  7. History of recurrent cellulitis.  8. History of fractured shoulder.   CURRENT MEDICATIONS:  None.   ALLERGIES:  No known drug allergies.   SOCIAL HISTORY:  He is single and has a history of tobacco, but quit in  1993.  He has very occasional alcohol and denies drug use.   FAMILY HISTORY:  Mother died at age 85 due to lung cancer.  She was a  heavy smoker and also had a history of coronary artery disease status  post CABG.  Father died at 92 due to prostate cancer.  He has three  brothers, two have passed away.  One died of cancer, the other one died  of massive myocardial infarction at age 68.   PHYSICAL EXAMINATION:  GENERAL:  He is in no acute distress.  He is  lying in bed and his respirations are unlabored.  VITAL SIGNS:  Blood pressure 155/86, heart rate 90, temperature 97.8,  and saturations 95% on room air.  HEENT:  Normal.  NECK:  Supple with no JVD.  Carotids are 2+ bilaterally without any  bruits.  There is no lymphadenopathy or thyromegaly.  HEART:  PMI is not displaced.  There is a regular rate and rhythm.  No  murmurs, rubs, or gallops.  LUNGS:  Clear.  ABDOMEN:  Soft, nontender, and nondistended.  No hepatosplenomegaly, no  bruits, no masses.  Good bowel sounds.  EXTREMITIES:  Warm with cyanosis, clubbing, or edema.  Femoral pulses  are 2+ bilaterally without bruits.  There is no rash.  DP pulses are 2+  bilaterally.  NEUROLOGY:  He is alert and oriented x3.  Cranial nerves II-XII grossly  intact.  He moves all four extremities without difficulty.  Affect is  mildly flattened.    Chest x-ray is normal.   EKG shows sinus rhythm at a rate of 90.  Question of a septal Q wave.  No acute ST T wave abnormalities.   LABORATORY DATA:  ISTAT of 129, potassium 7.4, chloride 106, bicarb 25,  BUN 15, creatinine 0.7, glucose 386.  White count 8.8, hemoglobin 16.4,  platelets 255.  CK-MB 1, troponin is less than 0.05, PTT 29 seconds,  creatinine 0.7.   ASSESSMENT:  1. Chest pain concerning for unstable angina.  2. Diabetes mellitus type 2 poorly controlled.  3. Hypertension.  4. Hyperlipidemia.  5. Medication noncompliance.  6. Depression.  7. Urinary hesitancy.  8. Questionable hyponatremia/hyperkalemia versus lab error due to      hemolysis.   PLAN:  We will admit him for rule out myocardial infarction.  We will  start heparin, aspirin, nitroglycerin, and statin.  We will load Plavix  and plan a catheterization in the morning.   Regarding his other medical problems, he will need a fasting lipid  panel, PSA, hemoglobin A1C.  We will start him on some insulin and  obtain a diabetes teaching consult.  Medication costs will certainly be  an issue for him.   We will recheck his electrolytes now.  Obviously if he truly has  hyponatremia and hyperkalemia in the setting of diabetes, I would worry  about a type 4 RTA and adrenal insufficiency.      Bevelyn Buckles. Bensimhon, MD  Electronically Signed     DRB/MEDQ  D:  09/05/2007  T:  09/05/2007  Job:  [id number]

## 2011-05-12 NOTE — Cardiovascular Report (Signed)
NAME:  [name] [name], [name] [name]              ACCOUNT NO.:  [id number]   MEDICAL RECORD NO.:  [id number]          PATIENT TYPE:  INP   LOCATION:  3705                         FACILITY:  [facility]   PHYSICIAN:  Arturo Morton. Riley Kill, MD, FACCDATE OF BIRTH:  10/11/1961   DATE OF PROCEDURE:  09/06/2007  DATE OF DISCHARGE:                            CARDIAC CATHETERIZATION   This gentleman has a history of diabetes and hypertension.  He was  previously taken care by Dr. Clelia Croft.  There has been an issue with regard  to insurance.  He has not been seen in followup and has been taking no  medications.  His blood pressure has been elevated when he gets it  checked.   He was admitted to the hospital with chest discomfort.  He was seen by  Dr. Gala Romney and set up for cardiac catheterization.   PROCEDURES:  1. Left heart catheterization.  2. Selective coronary arteriography.  3. Selective left ventriculography.   DESCRIPTION OF THE PROCEDURE:  The patient was brought to the  catheterization lab and prepped and draped in the usual fashion.  Through an anterior puncture, the right femoral artery was entered using  a Smart needle.  The patient was mildly vagal, and developed some  bradycardia, although not significant hypotension.  He was given 0.6 mg  of intravenous atropine, and there was resolution of symptoms.  There  were no complications.  Views of the left and right coronaries were  obtained.  Central aortic and left ventricular pressures were measured  with a pigtail.  Ventriculography was then performed in the RAO  projection.  I reviewed the films with the patient in the laboratory.  He was taken to the holding area in satisfactory clinical condition.  Both sugar and ACT were checked in the laboratory.   HEMODYNAMIC DATA:  1. Central aortic pressure 110/82, mean 96.  2. Left ventricular pressure 128/17.  3. No gradient on pullback across the aortic valve.   ANGIOGRAPHIC DATA:  1. Ventriculography  was done in the RAO projection.  There appeared to      be very mild global hypokinesis perhaps, with ejection fraction in      the 45%-55% range.  There was not significant mitral regurgitation.  2. The left main was free of critical disease.  3. The LAD coursed to the apex.  There was a large-caliber vessel,      without critical narrowing.  There were four diagonal branches,      perhaps the first diagonal had perhaps 20%-30% ostial narrowing.  4. The circumflex has a smooth 30%-40% mid stenosis.  This does not      appear to be flow limiting.  The vessel terminates as a bifurcating      marginal, without critical disease.  5. The right coronary has about 20% proximal narrowing, then      terminates as a posterior descending and posterolateral branch.   CONCLUSION:  1. Mild nonobstructive coronary artery disease.  2. 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   DISPOSITION:  1. Take medications.  2. Close followup.  3. Will check a D-dimer.      Arturo Morton. Riley Kill, MD, [facility]  Electronically Signed     TDS/MEDQ  D:  09/06/2007  T:  09/07/2007  Job:  [id number]   cc:   Bevelyn Buckles. Bensimhon, MD  [facility] Laboratory

## 2011-05-15 NOTE — Op Note (Signed)
[facility]  Patient:    [name] [name], [name] [name]                     MRN: [id number] Proc. Date: 10/15/00 Adm. Date:  [id number] Disc. Date: [id number] Attending:  Skip Mayer                           Operative Report  HISTORY OF PRESENT ILLNESS:   Fracture of the greater tuberosity and an anterior dislocation of his right shoulder. We initially tried a closed reduction on him in the emergency room and he was too uncomfortable and it was difficult to reduce, so I elected to take him to surgery under general anesthesia to do a closed reduction of an anterior fracture dislocation, right shoulder.  DESCRIPTION OF PROCEDURE:  Under general anesthesia, a routine closed reduction was carried out of a fracture dislocation of the right shoulder. X-ray was taken that showed the shoulder was well located and his greater tuberosity was anatomically reduced. Postop he did well. He was able to move his fingers well. He had a good pulse and he had normal motor and sensory exam. He was placed in a shoulder immobilizer and admitted to the hospital. DD:  12/24/00 TD:  12/24/00 Job: [id number]

## 2011-05-15 NOTE — Discharge Summary (Signed)
[facility]  Patient:    [name] [name], [name] [name] Visit Number: [id number] MRN: [id number]          Service Type: MED Location: [phone] Attending Physician:  Hinda Glatter Dictated by:   Armstead Peaks, M.D. Admit Date:  07/04/2002 Discharge Date: 07/10/2002                             Discharge Summary  DISCHARGE DIAGNOSES: 1. Cellulitis of the right foot. 2. History of recurrent cellulitis of the right foot. 3. Right shoulder fracture in October 2001.  PROCEDURES:  None.  DISCHARGE MEDICATIONS: 1. Keflex 500 mg t.i.d. x9 days. 2. Prednisone 40 mg q.d. x3 days. 3. Ibuprofen 600 mg t.i.d. x3 days. 4. Vicodin 5/500 mg q.4-6h. p.r.n. 5. Lamosil cream to fungal rash.  HISTORY OF PRESENT ILLNESS:  The patient is a 50 year old male who was admitted for evaluation and treatment of right foot cellulitis.  The patient has had two previous episodes of right foot cellulitis.  Over the one week prior to this admission he had several ER visits for increasing erythema and swelling and pain of the right foot.  He was tried on several courses of outpatient antibiotics, and ultimately was admitted on this date of admission for intravenous antibiotics.  The following developments took place:  HOSPITAL COURSE:  INFECTIOUS DISEASE:  The patient was placed on Ancef IV throughout his whole hospital stay with significant improvement in the erythema and swelling of the right foot.  He was slow to respond, however, by date of admission he had minimal erythema and minimal pain on ambulation.  He did have a x-ray of the right foot which showed no acute abnormalities. Specifically, the joint spaces looked normal and there was no fracture, dislocation, or other soft tissue abnormalities.  There was possible concern over gout, and the on call physician placed the patient on colchicine and prednisone which may possibly of helped, but was far from definitive.  He did in fact  have an uric acid level that was normal at 4.7.  He does have a history of gout in the right foot in the past.  By the time of discharge, the patient remained afebrile and had the ability to ambulate with significant improvement in his right foot cellulitis.  DISPOSITION:  The patient is discharged home in stable condition.  DIET:  Regular.  ACTIVITY:  He has no specific activity restrictions, but he will continue to elevate the right foot throughout the remaining nine days of antibiotics which he will take at home, which is Keflex.  He will complete a three day course of prednisone and ibuprofen as an outpatient.  FOLLOWUP:  Myself, Dr. Hinda Glatter, at [facility] in two weeks for further evaluation. Dictated by:   Armstead Peaks, M.D. Attending Physician:  Hinda Glatter DD:  07/11/02 TD:  07/13/02 Job: [id number]

## 2011-05-15 NOTE — Op Note (Signed)
[place]  Patient:    [name] [name], [name] [name]                     MRN: [id number] Proc. Date: 10/15/00 Adm. Date:  [id number] Disc. Date: [id number] Attending:  Skip Mayer                           Operative Report  PROCEDURE:  Under general anesthesia, I did closed reduction of fracture of the greater tuberosity and anterior dislocation of the right shoulder.  The shoulder went in with ease under general anesthesia.  An x-ray was taken to verify the anatomical position of the shoulder and fracture.  SURGEON:  Georges Lynch. Darrelyn Hillock, M.D.  HISTORY:  I was called to [facility] to see him at about 6 oclock as he had a fracture anterior dislocation of his right shoulder.  I went in and saw the patient and evaluated him.  He had already been given 10 mg of morphine IV, and we tried to do a closed reduction in the emergency room, but it was too painful, and I was quite concerned because of the fracture dislocation in his vascular supply, and I was concerned about any possible nerve injuries.  I called and spoke directly to the OR nurse who came down to the emergency room. She said it would be well after 8 oclock before I could take him up to surgery because there were a couple of hard cases, etc. So I simply transferred him to [facility] for that reason because I did not feel that it was safe to wait that long.  So he came over here by ambulance, and I took him directly back to surgery.  ANESTHESIA:  General.  PROCEDURE IN DETAIL:  I did a closed reduction of the fracture dislocation with some anterior dislocation of the right shoulder.  This was done under general anesthesia.  At this time, an x-ray was taken to verify anatomical position of the shoulder and the greater tuberosity fracture.  He was then placed in a shoulder immobilizer and admitted to the hospital overnight. Prior to discharge, we will obtain x-rays of his  neck because he did fall and hit his head on the ground, but he was alert and oriented. DD:  10/15/00 TD:  10/17/00 Job: [id number]

## 2011-06-22 ENCOUNTER — Ambulatory Visit (INDEPENDENT_AMBULATORY_CARE_PROVIDER_SITE_OTHER): Payer: Self-pay

## 2011-06-22 DIAGNOSIS — K5900 Constipation, unspecified: Secondary | ICD-10-CM

## 2011-06-25 ENCOUNTER — Ambulatory Visit (INDEPENDENT_AMBULATORY_CARE_PROVIDER_SITE_OTHER): Payer: Self-pay

## 2011-06-25 DIAGNOSIS — R197 Diarrhea, unspecified: Secondary | ICD-10-CM

## 2011-07-13 ENCOUNTER — Ambulatory Visit (INDEPENDENT_AMBULATORY_CARE_PROVIDER_SITE_OTHER): Payer: Self-pay

## 2011-07-13 DIAGNOSIS — K5900 Constipation, unspecified: Secondary | ICD-10-CM

## 2011-07-27 ENCOUNTER — Ambulatory Visit (INDEPENDENT_AMBULATORY_CARE_PROVIDER_SITE_OTHER): Payer: Self-pay

## 2011-07-27 DIAGNOSIS — R197 Diarrhea, unspecified: Secondary | ICD-10-CM

## 2011-08-10 ENCOUNTER — Ambulatory Visit (INDEPENDENT_AMBULATORY_CARE_PROVIDER_SITE_OTHER): Payer: Self-pay

## 2011-08-10 DIAGNOSIS — R197 Diarrhea, unspecified: Secondary | ICD-10-CM

## 2011-09-07 ENCOUNTER — Ambulatory Visit: Payer: Self-pay

## 2011-09-08 ENCOUNTER — Ambulatory Visit: Payer: Self-pay

## 2011-09-29 LAB — DIFFERENTIAL
Basophils Absolute: 0
Basophils Relative: 0
Eosinophils Absolute: 0.2
Eosinophils Relative: 2
Lymphocytes Relative: 39
Lymphs Abs: 3.3
Monocytes Absolute: 0.5
Monocytes Relative: 6
Neutro Abs: 4.5
Neutrophils Relative %: 53

## 2011-09-29 LAB — URINALYSIS, ROUTINE W REFLEX MICROSCOPIC
Bilirubin Urine: NEGATIVE
Glucose, UA: >1000 — AB
Hgb urine dipstick: NEGATIVE
Ketones, ur: NEGATIVE
Leukocytes, UA: NEGATIVE
Nitrite: NEGATIVE
Protein, ur: NEGATIVE
Specific Gravity, Urine: 1.038 — ABNORMAL HIGH
Urobilinogen, UA: 0.2
pH: 5.5

## 2011-09-29 LAB — BASIC METABOLIC PANEL
BUN: 11
CO2: 27
Calcium: 9.8
Chloride: 100
Creatinine, Ser: 1.02
GFR calc Af Amer: >60
GFR calc non Af Amer: >60
Glucose, Bld: 495 — ABNORMAL HIGH
Potassium: 4
Sodium: 136

## 2011-09-29 LAB — CBC
HCT: 48.4
Hemoglobin: 16.4
MCHC: 33.9
MCV: 91.5
Platelets: 224
RBC: 5.29
RDW: 12.7
WBC: 8.5

## 2011-09-29 LAB — GLUCOSE, CAPILLARY: Glucose-Capillary: 557

## 2011-09-29 LAB — URINE MICROSCOPIC-ADD ON: Urine-Other: NONE SEEN

## 2011-09-29 LAB — KETONES, QUALITATIVE: Acetone, Bld: NEGATIVE

## 2011-10-05 ENCOUNTER — Ambulatory Visit (INDEPENDENT_AMBULATORY_CARE_PROVIDER_SITE_OTHER): Payer: Self-pay

## 2011-10-05 DIAGNOSIS — R197 Diarrhea, unspecified: Secondary | ICD-10-CM

## 2011-10-09 LAB — URINALYSIS, ROUTINE W REFLEX MICROSCOPIC
Bilirubin Urine: NEGATIVE
Glucose, UA: 500 — AB
Hgb urine dipstick: NEGATIVE
Ketones, ur: NEGATIVE
Nitrite: NEGATIVE
Protein, ur: NEGATIVE
Specific Gravity, Urine: 1.034 — ABNORMAL HIGH
Urobilinogen, UA: 0.2
pH: 7

## 2011-10-09 LAB — HEMOGLOBIN A1C
Hgb A1c MFr Bld: 13.1 — ABNORMAL HIGH
Mean Plasma Glucose: 389

## 2011-10-09 LAB — CBC
HCT: 43.2
HCT: 43.5
HCT: 47.2
Hemoglobin: 14.8
Hemoglobin: 14.9
Hemoglobin: 16.4
MCHC: 34.2
MCHC: 34.3
MCHC: 34.7
MCV: 88.1
MCV: 88.7
MCV: 88.7
Platelets: 208
Platelets: 212
Platelets: 255
RBC: 4.87
RBC: 4.9
RBC: 5.36
RDW: 12.6
RDW: 12.9
RDW: 13
WBC: 7.7
WBC: 8.8
WBC: 8.8

## 2011-10-09 LAB — COMPREHENSIVE METABOLIC PANEL
ALT: 44
ALT: 48
AST: 26
AST: 29
Albumin: 3.2 — ABNORMAL LOW
Albumin: 3.6
Alkaline Phosphatase: 64
Alkaline Phosphatase: 74
BUN: 12
BUN: 13
CO2: 27
CO2: 27
Calcium: 8.8
Calcium: 9.3
Chloride: 106
Chloride: 97
Creatinine, Ser: 0.81
Creatinine, Ser: 0.83
GFR calc Af Amer: >60
GFR calc Af Amer: >60
GFR calc non Af Amer: >60
GFR calc non Af Amer: >60
Glucose, Bld: 218 — ABNORMAL HIGH
Glucose, Bld: 300 — ABNORMAL HIGH
Potassium: 3.5
Potassium: 3.6
Sodium: 134 — ABNORMAL LOW
Sodium: 138
Total Bilirubin: 0.6
Total Bilirubin: 0.7
Total Protein: 5.7 — ABNORMAL LOW
Total Protein: 6.5

## 2011-10-09 LAB — CORTISOL-AM, BLOOD: Cortisol - AM: 8.1

## 2011-10-09 LAB — POCT CARDIAC MARKERS
CKMB, poc: 1
CKMB, poc: 1.4
Myoglobin, poc: 69.6
Myoglobin, poc: 71.8
Operator id: 151321
Operator id: 151321
Troponin i, poc: <0.05
Troponin i, poc: <0.05

## 2011-10-09 LAB — CARDIAC PANEL(CRET KIN+CKTOT+MB+TROPI)
CK, MB: 2
CK, MB: 2.1
Relative Index: 1.7
Relative Index: 1.9
Total CK: 107
Total CK: 126
Troponin I: 0.03
Troponin I: 0.03

## 2011-10-09 LAB — I-STAT 8, (EC8 V) (CONVERTED LAB)
Acid-Base Excess: 1
BUN: 15
Bicarbonate: 25.1 — ABNORMAL HIGH
Chloride: 106
Glucose, Bld: 386 — ABNORMAL HIGH
HCT: 50
Hemoglobin: 17
Operator id: 151321
Potassium: 7.4
Sodium: 129 — ABNORMAL LOW
TCO2: 26
pCO2, Ven: 38.8 — ABNORMAL LOW
pH, Ven: 7.418 — ABNORMAL HIGH

## 2011-10-09 LAB — LIPID PANEL
Cholesterol: 210 — ABNORMAL HIGH
HDL: 31 — ABNORMAL LOW
LDL Cholesterol: 102 — ABNORMAL HIGH
Total CHOL/HDL Ratio: 6.8
Triglycerides: 384 — ABNORMAL HIGH
VLDL: 77 — ABNORMAL HIGH

## 2011-10-09 LAB — APTT
aPTT: 29
aPTT: 78 — ABNORMAL HIGH

## 2011-10-09 LAB — DIFFERENTIAL
Basophils Absolute: 0
Basophils Relative: 1
Eosinophils Absolute: 0.3
Eosinophils Relative: 3
Lymphocytes Relative: 33
Lymphs Abs: 2.9
Monocytes Absolute: 0.5
Monocytes Relative: 6
Neutro Abs: 5.1
Neutrophils Relative %: 57

## 2011-10-09 LAB — BASIC METABOLIC PANEL
BUN: 13
CO2: 28
Calcium: 9.7
Chloride: 98
Creatinine, Ser: 0.71
GFR calc Af Amer: >60
GFR calc non Af Amer: >60
Glucose, Bld: 292 — ABNORMAL HIGH
Potassium: 4
Sodium: 136

## 2011-10-09 LAB — BASIC METABOLIC PANEL WITH GFR
BUN: 6
CO2: 29
Calcium: 9.3
Chloride: 104
Creatinine, Ser: 0.82
GFR calc Af Amer: >60
GFR calc non Af Amer: >60
Glucose, Bld: 232 — ABNORMAL HIGH
Potassium: 3.8
Sodium: 140

## 2011-10-09 LAB — POCT I-STAT CREATININE
Creatinine, Ser: 0.7
Operator id: 151321

## 2011-10-09 LAB — CK TOTAL AND CKMB (NOT AT ARMC)
CK, MB: 2.4
Relative Index: 1.4
Total CK: 170

## 2011-10-09 LAB — TROPONIN I: Troponin I: 0.03

## 2011-10-09 LAB — TSH: TSH: 4.313

## 2011-10-09 LAB — PROTIME-INR
INR: 1
Prothrombin Time: 13.2

## 2011-10-09 LAB — HEPARIN LEVEL (UNFRACTIONATED): Heparin Unfractionated: 0.36

## 2011-10-09 LAB — D-DIMER, QUANTITATIVE: D-Dimer, Quant: 0.22

## 2011-11-16 ENCOUNTER — Ambulatory Visit: Payer: Self-pay

## 2011-11-17 ENCOUNTER — Ambulatory Visit: Payer: Self-pay

## 2011-11-26 ENCOUNTER — Other Ambulatory Visit (HOSPITAL_COMMUNITY): Payer: Self-pay | Admitting: Family Medicine

## 2011-11-26 ENCOUNTER — Ambulatory Visit (HOSPITAL_COMMUNITY)
Admission: RE | Admit: 2011-11-26 | Discharge: 2011-11-26 | Disposition: A | Discharge status: Home / Self Care | Payer: Self-pay | Source: Ambulatory Visit | Attending: Family Medicine | Admitting: Family Medicine

## 2011-11-26 DIAGNOSIS — R52 Pain, unspecified: Secondary | ICD-10-CM

## 2011-11-26 DIAGNOSIS — M545 Low back pain, unspecified: Secondary | ICD-10-CM | POA: Insufficient documentation

## 2011-11-26 DIAGNOSIS — IMO0002 Reserved for concepts with insufficient information to code with codable children: Secondary | ICD-10-CM | POA: Insufficient documentation

## 2011-11-26 DIAGNOSIS — M542 Cervicalgia: Secondary | ICD-10-CM | POA: Insufficient documentation

## 2011-11-26 DIAGNOSIS — X500XXA Overexertion from strenuous movement or load, initial encounter: Secondary | ICD-10-CM | POA: Insufficient documentation

## 2011-11-29 ENCOUNTER — Encounter: Payer: Self-pay | Admitting: Emergency Medicine

## 2011-11-29 ENCOUNTER — Emergency Department (HOSPITAL_COMMUNITY)
Admission: EM | Admit: 2011-11-29 | Discharge: 2011-11-29 | Disposition: A | Discharge status: Home / Self Care | Payer: Self-pay | Attending: Emergency Medicine | Admitting: Emergency Medicine

## 2011-11-29 DIAGNOSIS — E119 Type 2 diabetes mellitus without complications: Secondary | ICD-10-CM | POA: Insufficient documentation

## 2011-11-29 DIAGNOSIS — M79609 Pain in unspecified limb: Secondary | ICD-10-CM | POA: Insufficient documentation

## 2011-11-29 DIAGNOSIS — M5430 Sciatica, unspecified side: Secondary | ICD-10-CM | POA: Insufficient documentation

## 2011-11-29 DIAGNOSIS — M5432 Sciatica, left side: Secondary | ICD-10-CM

## 2011-11-29 DIAGNOSIS — IMO0001 Reserved for inherently not codable concepts without codable children: Secondary | ICD-10-CM | POA: Insufficient documentation

## 2011-11-29 DIAGNOSIS — M545 Low back pain, unspecified: Secondary | ICD-10-CM | POA: Insufficient documentation

## 2011-11-29 DIAGNOSIS — Z79899 Other long term (current) drug therapy: Secondary | ICD-10-CM | POA: Insufficient documentation

## 2011-11-29 DIAGNOSIS — I10 Essential (primary) hypertension: Secondary | ICD-10-CM | POA: Insufficient documentation

## 2011-11-29 DIAGNOSIS — M549 Dorsalgia, unspecified: Secondary | ICD-10-CM

## 2011-11-29 HISTORY — DX: Essential (primary) hypertension: I10

## 2011-11-29 HISTORY — DX: Sleep apnea, unspecified: G47.30

## 2011-11-29 MED ORDER — HYDROMORPHONE HCL PF 1 MG/ML IJ SOLN
1.0000 mg | Freq: Once | INTRAMUSCULAR | Status: AC
  Administered 2011-11-29: 1 mg via INTRAMUSCULAR
  Filled 2011-11-29: qty 1

## 2011-11-29 MED ORDER — OXYCODONE-ACETAMINOPHEN 5-325 MG PO TABS
1.0000 | ORAL_TABLET | Freq: Once | ORAL | Status: AC
  Administered 2011-11-29: 1 via ORAL
  Filled 2011-11-29: qty 1

## 2011-11-29 MED ORDER — OXYCODONE-ACETAMINOPHEN 5-325 MG PO TABS: 1.0000 | ORAL_TABLET | ORAL | Status: AC | PRN

## 2011-11-29 MED ORDER — KETOROLAC TROMETHAMINE 30 MG/ML IJ SOLN
30.0000 mg | Freq: Once | INTRAMUSCULAR | Status: AC
  Administered 2011-11-29: 30 mg via INTRAMUSCULAR
  Filled 2011-11-29: qty 1

## 2011-11-29 NOTE — ED Notes (Signed)
C/o lower back pain since June.  Pain radiates down L leg.  No recent injury. States pain started when lifting dog that could not walk in June.

## 2011-11-29 NOTE — ED Notes (Signed)
Pt states he is here for lower back pain that radiates into left leg, describes pain shoots into left leg, , states he injured back in June and just went to doctor for this condition 2 days ago, here for pain tonight

## 2011-11-29 NOTE — ED Notes (Signed)
Pt barcode and scanner not working in hallway

## 2011-11-29 NOTE — ED Provider Notes (Signed)
History     CSN: [id number] Arrival date & time: 11/29/2011  7:37 PM   First MD Initiated Contact with Patient 11/29/11 2114      Chief Complaint  Patient presents with  . Back Pain    (Consider location/radiation/quality/duration/timing/severity/associated sxs/prior treatment) Patient is a 50 y.o. male presenting with back pain. The history is provided by the patient.  Back Pain  This is a new problem. The current episode started yesterday. The problem occurs constantly. The problem has not changed since onset.The pain is associated with lifting heavy objects. The pain is present in the gluteal region. The quality of the pain is described as cramping. The pain radiates to the left thigh, left knee and left foot. The pain is at a severity of 10/10. The symptoms are aggravated by certain positions and bending. The pain is the same all the time. Associated symptoms include leg pain. Pertinent negatives include no chest pain, no fever, no numbness, no headaches, no abdominal pain, no bowel incontinence, no perianal numbness, no bladder incontinence, no paresthesias, no paresis, no tingling and no weakness. He has tried analgesics and muscle relaxants for the symptoms. The treatment provided mild relief. Risk factors: prior injury.    Past Medical History  Diagnosis Date  . Diabetes mellitus   . Hypertension   . Sleep apnea     History reviewed. No pertinent past surgical history.  No family history on file.  History  Substance Use Topics  . Smoking status: Never Smoker   . Smokeless tobacco: Not on file  . Alcohol Use: No      Review of Systems  Unable to perform ROS Constitutional: Negative for fever and chills.  Respiratory: Negative for cough and shortness of breath.   Cardiovascular: Negative for chest pain and palpitations.  Gastrointestinal: Negative for nausea, vomiting, abdominal pain and bowel incontinence.  Genitourinary: Negative for bladder incontinence and  difficulty urinating.  Musculoskeletal: Positive for back pain. Negative for myalgias and arthralgias.  Skin: Negative for color change and rash.  Neurological: Negative for tingling, weakness, light-headedness, numbness, headaches and paresthesias.  All other systems reviewed and are negative.    Allergies  Pravastatin  Home Medications   Current Outpatient Rx  Name Route Sig Dispense Refill  . BUPROPION HCL ER (SR) 200 MG PO TB12 Oral Take 200 mg by mouth 2 (two) times daily.      . CELEXA PO Oral Take 1 tablet by mouth daily.      . CYCLOBENZAPRINE HCL 10 MG PO TABS Oral Take 10 mg by mouth 3 (three) times daily as needed.     Marland Kitchen FLUTICASONE PROPIONATE 50 MCG/ACT NA SUSP Nasal Place 2 sprays into the nose daily.      Marland Kitchen LISINOPRIL-HYDROCHLOROTHIAZIDE 20-25 MG PO TABS Oral Take 1 tablet by mouth daily.      Marland Kitchen LORATADINE 10 MG PO TABS Oral Take 10 mg by mouth daily.      Marland Kitchen METFORMIN HCL 850 MG PO TABS Oral Take 850 mg by mouth 2 (two) times daily with a meal.      . NAPROXEN 500 MG PO TABS Oral Take 500 mg by mouth 2 (two) times daily with a meal.      . OLOPATADINE HCL 0.6 % NA SOLN Nasal Place 2 puffs into the nose daily.      . OMEGA-3-ACID ETHYL ESTERS 1 G PO CAPS Oral Take 1 g by mouth daily.      Marland Kitchen PROPRANOLOL HCL PO Oral  Take 1 tablet by mouth daily.     Marland Kitchen TAMSULOSIN HCL 0.4 MG PO CAPS Oral Take 0.4 mg by mouth daily after supper.      . TRAMADOL HCL 50 MG PO TABS Oral Take 50 mg by mouth every 6 (six) hours as needed. Maximum dose= 8 tablets per day     . VERAPAMIL HCL PO Oral Take 1 capsule by mouth daily.       BP 128/77  Pulse 73  Temp(Src) 97.5 F (36.4 C) (Oral)  Resp 18  SpO2 100%  Physical Exam  Nursing note and vitals reviewed. Constitutional: He is oriented to person, place, and time. He appears well-developed and well-nourished.  HENT:  Head: Normocephalic and atraumatic.  Eyes: EOM are normal. Pupils are equal, round, and reactive to light.    Cardiovascular: Normal rate and regular rhythm.   Pulmonary/Chest: Effort normal and breath sounds normal. No respiratory distress.  Abdominal: Soft. There is no tenderness.  Musculoskeletal: He exhibits tenderness (L sided low back; no midline tenderness).  Neurological: He is alert and oriented to person, place, and time. He has normal strength and normal reflexes. No sensory deficit. He exhibits normal muscle tone.  Skin: Skin is warm and dry.  Psychiatric: He has a normal mood and affect.    ED Course  Procedures (including critical care time)  Labs Reviewed - No data to display No results found.   No diagnosis found.    MDM  50 year old male who suffered a back injury this past June and has been having progressive sciatica type symptoms since then. He was seen by his PCP 3 days ago and was prescribed naproxen and Flexeril for pain control, states that since he's been taking those medications has been doing much better. However yesterday he picked up a heavy box at work, and since then has been having acute worsening of his pain. States pain is primarily in the low back on his left side, and radiates down to his foot, is worse with movement, and also has pain while laying flat. Denies any weakness, numbness, loss of control of bowel or bladder, or any red flag signs. On exam patient has mild tenderness to palpation of the lower left lumbar region, but has no midline tenderness. He has worsening pain with straight leg on the left, which he states radiates down into his left calf. He has no pain with straight leg testing of his right leg. There are no neuro deficits noted, he has normal strength and sensation, including in the inner thighs. Discussed with patient long-term management of his chronic low back pain, as well as for management of his acute exacerbation, also discussed need for followup with PCP for long-term management of his chronic low back pain, as well as indications and  developing signs which would necessitate return to ED. Patient expresses understanding.        Theotis Burrow, MD 11/29/11 2340

## 2011-12-01 NOTE — ED Provider Notes (Signed)
I saw and evaluated the patient, reviewed the resident's note and I agree with the findings and plan.   Davion Meara A. Ytzel Gubler, MD 12/01/11 1042 

## 2011-12-07 ENCOUNTER — Other Ambulatory Visit (HOSPITAL_COMMUNITY): Payer: Self-pay | Admitting: Family Medicine

## 2011-12-07 DIAGNOSIS — M549 Dorsalgia, unspecified: Secondary | ICD-10-CM

## 2011-12-14 ENCOUNTER — Ambulatory Visit (HOSPITAL_COMMUNITY)
Admission: RE | Admit: 2011-12-14 | Discharge: 2011-12-14 | Disposition: A | Discharge status: Home / Self Care | Payer: Self-pay | Source: Ambulatory Visit | Attending: Family Medicine | Admitting: Family Medicine

## 2011-12-14 DIAGNOSIS — M5126 Other intervertebral disc displacement, lumbar region: Secondary | ICD-10-CM | POA: Insufficient documentation

## 2011-12-14 DIAGNOSIS — M549 Dorsalgia, unspecified: Secondary | ICD-10-CM

## 2011-12-29 HISTORY — PX: TONSILLECTOMY: SUR1361

## 2012-02-11 ENCOUNTER — Ambulatory Visit: Payer: Self-pay

## 2012-06-10 ENCOUNTER — Encounter: Payer: Self-pay | Admitting: Physical Medicine & Rehabilitation

## 2012-06-24 ENCOUNTER — Ambulatory Visit (HOSPITAL_BASED_OUTPATIENT_CLINIC_OR_DEPARTMENT_OTHER): Payer: Self-pay | Admitting: Physical Medicine & Rehabilitation

## 2012-06-24 ENCOUNTER — Encounter: Payer: Self-pay | Admitting: Physical Medicine & Rehabilitation

## 2012-06-24 ENCOUNTER — Encounter: Payer: Self-pay | Attending: Physical Medicine & Rehabilitation

## 2012-06-24 VITALS — BP 143/81 | HR 100 | Resp 16 | Ht 70.0 in | Wt 185.0 lb

## 2012-06-24 DIAGNOSIS — IMO0002 Reserved for concepts with insufficient information to code with codable children: Secondary | ICD-10-CM | POA: Insufficient documentation

## 2012-06-24 DIAGNOSIS — M545 Low back pain, unspecified: Secondary | ICD-10-CM | POA: Insufficient documentation

## 2012-06-24 DIAGNOSIS — M5137 Other intervertebral disc degeneration, lumbosacral region: Secondary | ICD-10-CM

## 2012-06-24 DIAGNOSIS — IMO0001 Reserved for inherently not codable concepts without codable children: Secondary | ICD-10-CM | POA: Insufficient documentation

## 2012-06-24 DIAGNOSIS — M5126 Other intervertebral disc displacement, lumbar region: Secondary | ICD-10-CM | POA: Insufficient documentation

## 2012-06-24 NOTE — Progress Notes (Signed)
Subjective:    Patient ID: [name] [name], male    DOB: 1961/05/24, 51 y.o.   MRN: [id number]  HPI Year ago started to have left low back pain which progressed over 3-4 months to left buttocks pain At one point had pain going all the way to the left foot. Then had pain that went to the left knee across the anterior thigh. Pain partially relieved after second prednisone treatment MRI of the lumbar spine was performed 12/14/2011. There was a mile disc bulge at L3-L4 on the left side. There was a small annular tear at L4-5 without nerve root displacement. There was a disc extrusion at L5-S1 areaonly has pain that radiates to his knee. He does have some weakness in his left thigh when he tries to step up on a ladder. He has no pain or weakness in his left foot or ankle area Pain Inventory Average Pain 5 Pain Right Now 5 My pain is sharp, burning, stabbing and aching  In the last 24 hours, has pain interfered with the following? General activity 0 Relation with others 0 Enjoyment of life 4 What TIME of day is your pain at its worst? Evening and Night Sleep (in general) Fair  Pain is worse with: walking, bending, standing and some activites Pain improves with: rest, heat/ice and medication Relief from Meds: 3  Mobility walk without assistance how many minutes can you walk? 15 ability to climb steps?  yes do you drive?  yes  Function employed # of hrs/week 10-12  Neuro/Psych weakness tremor depression anxiety  Prior Studies CT/MRI MRI LUMBAR SPINE WITHOUT CONTRAST  Technique: Multiplanar and multiecho pulse sequences of the lumbar  spine were obtained without intravenous contrast.  Comparison: Plain films lumbar spine 11/26/2011.  Findings: Vertebral body height and alignment are maintained.  Small hemangioma in L3 is incidentally noted. No worrisome marrow  lesion. The conus medullaris is normal in signal and position.  Imaged intra-abdominal contents are unremarkable.  The  T11-12 and T12-L1 levels are imaged in the sagittal plane only  and negative.  L1-2: Negative.  L2-3: Negative.  L3-4: Mild disc bulge eccentrically prominent in the left  paravertebral space. Disc just contacts the exited left L3 root  without compressing or displacing it. The central canal and right  foramen are widely patent. Left foramen is mildly narrowed.  L4-5: Small annular tear and mild broad-based disc bulge without  central canal or foraminal stenosis.  L5-S1: The patient has a very large disc extrusion extending  cephalad out of the left lateral recess. The disc fragment  measures 1.5 cm transverse by 1.1 cm AP by approximately 2.3 cm  cranial-caudal. The disc deforms the left aspect of the thecal sac  and impinges on both the descending left L5 and S1 roots. Foramina  appear open.  IMPRESSION:  1. Dominant finding is a large disc extrusion extending cephalad  out of the left lateral recess at L5-S1. The disc compresses both  the descending left L5 and S1 roots and deforms the left aspect of  the thecal sac.  2. Left paravertebral disc bulge at L3-4 just contacts the exited  left L3 root without compressing or displacing it.  3. Mild disc bulge L4-5 without central canal or foraminal  stenosis.  Physicians involved in your care Primary care Dr. Norberto Sorenson   History reviewed. No pertinent family history. History   Social History  . Marital Status: Single    Spouse Name: N/A    Number of Children:  N/A  . Years of Education: N/A   Social History Main Topics  . Smoking status: Never Smoker   . Smokeless tobacco: None  . Alcohol Use: No  . Drug Use: No  . Sexually Active:    Other Topics Concern  . None   Social History Narrative  . None   History reviewed. No pertinent past surgical history. Past Medical History  Diagnosis Date  . Diabetes mellitus   . Hypertension   . Sleep apnea    BP 143/81  Pulse 100  Resp 16  Ht 5\' 10"  (1.778 m)  Wt 185 lb  (83.915 kg)  BMI 26.54 kg/m2  SpO2 95%      Review of Systems  HENT: Negative.   Eyes: Negative.   Respiratory: Negative.   Cardiovascular: Negative.   Gastrointestinal: Negative.   Genitourinary: Positive for difficulty urinating.  Musculoskeletal: Negative.   Skin: Negative.   Neurological: Positive for tremors and weakness.  Hematological: Negative.   Psychiatric/Behavioral: Negative.        Depression/Anxiety       Objective:   Physical Exam  Constitutional: He is oriented to person, place, and time. He appears well-developed and well-nourished.  Musculoskeletal:       Left hip: Normal.       Left knee: Normal.       Lumbar back: He exhibits tenderness, pain and spasm. He exhibits normal range of motion, no swelling, no edema and no deformity.  Neurological: He is alert and oriented to person, place, and time. He displays no atrophy. Gait normal.  Reflex Scores:      Tricep reflexes are 3+ on the right side and 3+ on the left side.      Bicep reflexes are 3+ on the right side and 3+ on the left side.      Brachioradialis reflexes are 3+ on the right side and 3+ on the left side.      Patellar reflexes are 3+ on the right side and 2+ on the left side.      Achilles reflexes are 3+ on the right side and 2+ on the left side.      Negative straight leg raising test  Psychiatric: He has a normal mood and affect.          Assessment & Plan:  1. Left L3 radiculitis. I think he would benefit from L3-L4 transforaminal injection. Discussed risks and benefits. I do not think his extrusion at L5-S1 is causing any radicular symptoms. We'll schedule for the injection. Discussed with patient agrees with plan. May need up to 3 injections

## 2012-06-24 NOTE — Patient Instructions (Addendum)
Your next visit will be for an L3-L4 injection on the left side  Back Exercises Back exercises help treat and prevent back injuries. The goal of back exercises is to increase the strength of your abdominal and back muscles and the flexibility of your back. These exercises should be started when you no longer have back pain. Back exercises include:  Pelvic Tilt. Lie on your back with your knees bent. Tilt your pelvis until the lower part of your back is against the floor. Hold this position 5 to 10 sec and repeat 5 to 10 times.   Knee to Chest. Pull first 1 knee up against your chest and hold for 20 to 30 seconds, repeat this with the other knee, and then both knees. This may be done with the other leg straight or bent, whichever feels better.   Sit-Ups or Curl-Ups. Bend your knees 90 degrees. Start with tilting your pelvis, and do a partial, slow sit-up, lifting your trunk only 30 to 45 degrees off the floor. Take at least 2 to 3 seconds for each sit-up. Do not do sit-ups with your knees out straight. If partial sit-ups are difficult, simply do the above but with only tightening your abdominal muscles and holding it as directed.   Hip-Lift. Lie on your back with your knees flexed 90 degrees. Push down with your feet and shoulders as you raise your hips a couple inches off the floor; hold for 10 seconds, repeat 5 to 10 times.   Back arches. Lie on your stomach, propping yourself up on bent elbows. Slowly press on your hands, causing an arch in your low back. Repeat 3 to 5 times. Any initial stiffness and discomfort should lessen with repetition over time.   Shoulder-Lifts. Lie face down with arms beside your body. Keep hips and torso pressed to floor as you slowly lift your head and shoulders off the floor.  Do not overdo your exercises, especially in the beginning. Exercises may cause you some mild back discomfort which lasts for a few minutes; however, if the pain is more severe, or lasts for more  than 15 minutes, do not continue exercises until you see your caregiver. Improvement with exercise therapy for back problems is slow.  See your caregivers for assistance with developing a proper back exercise program. Document Released: 01/21/2005 Document Revised: 12/03/2011 Document Reviewed: 12/14/2005 [facility] Patient Information 2012 [place], [place].

## 2012-07-26 ENCOUNTER — Ambulatory Visit: Payer: Self-pay | Admitting: Physical Medicine & Rehabilitation

## 2013-07-06 ENCOUNTER — Ambulatory Visit: Payer: Self-pay | Attending: Nurse Practitioner | Admitting: Physical Therapy

## 2013-10-18 ENCOUNTER — Encounter: Payer: Self-pay | Admitting: Internal Medicine

## 2013-11-14 DIAGNOSIS — J351 Hypertrophy of tonsils: Secondary | ICD-10-CM | POA: Insufficient documentation

## 2013-12-19 ENCOUNTER — Emergency Department (HOSPITAL_COMMUNITY)
Admission: EM | Admit: 2013-12-19 | Discharge: 2013-12-20 | Disposition: A | Discharge status: Home / Self Care | Payer: Self-pay | Attending: Emergency Medicine | Admitting: Emergency Medicine

## 2013-12-19 ENCOUNTER — Encounter (HOSPITAL_COMMUNITY): Payer: Self-pay | Admitting: Emergency Medicine

## 2013-12-19 ENCOUNTER — Emergency Department (HOSPITAL_COMMUNITY): Payer: Self-pay

## 2013-12-19 DIAGNOSIS — Z9889 Other specified postprocedural states: Secondary | ICD-10-CM | POA: Insufficient documentation

## 2013-12-19 DIAGNOSIS — G8918 Other acute postprocedural pain: Secondary | ICD-10-CM | POA: Insufficient documentation

## 2013-12-19 DIAGNOSIS — E119 Type 2 diabetes mellitus without complications: Secondary | ICD-10-CM | POA: Insufficient documentation

## 2013-12-19 DIAGNOSIS — Z9089 Acquired absence of other organs: Secondary | ICD-10-CM | POA: Insufficient documentation

## 2013-12-19 DIAGNOSIS — R220 Localized swelling, mass and lump, head: Secondary | ICD-10-CM | POA: Insufficient documentation

## 2013-12-19 DIAGNOSIS — I10 Essential (primary) hypertension: Secondary | ICD-10-CM | POA: Insufficient documentation

## 2013-12-19 DIAGNOSIS — R070 Pain in throat: Secondary | ICD-10-CM | POA: Insufficient documentation

## 2013-12-19 DIAGNOSIS — Z79899 Other long term (current) drug therapy: Secondary | ICD-10-CM | POA: Insufficient documentation

## 2013-12-19 LAB — CBC WITH DIFFERENTIAL/PLATELET
Basophils Absolute: 0 10*3/uL (ref 0.0–0.1)
Basophils Relative: 0 % (ref 0–1)
Eosinophils Absolute: 0.1 10*3/uL (ref 0.0–0.7)
Eosinophils Relative: 1 % (ref 0–5)
HCT: 44.7 % (ref 39.0–52.0)
Hemoglobin: 15.7 g/dL (ref 13.0–17.0)
Lymphocytes Relative: 29 % (ref 12–46)
Lymphs Abs: 2.7 10*3/uL (ref 0.7–4.0)
MCH: 31.3 pg (ref 26.0–34.0)
MCHC: 35.1 g/dL (ref 30.0–36.0)
MCV: 89.2 fL (ref 78.0–100.0)
Monocytes Absolute: 0.9 10*3/uL (ref 0.1–1.0)
Monocytes Relative: 9 % (ref 3–12)
Neutro Abs: 5.7 10*3/uL (ref 1.7–7.7)
Neutrophils Relative %: 60 % (ref 43–77)
Platelets: 219 10*3/uL (ref 150–400)
RBC: 5.01 MIL/uL (ref 4.22–5.81)
RDW: 12.5 % (ref 11.5–15.5)
WBC: 9.4 10*3/uL (ref 4.0–10.5)

## 2013-12-19 LAB — GLUCOSE, CAPILLARY: Glucose-Capillary: 124 mg/dL — ABNORMAL HIGH (ref 70–99)

## 2013-12-19 LAB — POCT I-STAT, CHEM 8
BUN: 24 mg/dL — ABNORMAL HIGH (ref 6–23)
Calcium, Ion: 1.23 mmol/L (ref 1.12–1.23)
Chloride: 101 mEq/L (ref 96–112)
Creatinine, Ser: 1.1 mg/dL (ref 0.50–1.35)
Glucose, Bld: 109 mg/dL — ABNORMAL HIGH (ref 70–99)
HCT: 45 % (ref 39.0–52.0)
Hemoglobin: 15.3 g/dL (ref 13.0–17.0)
Potassium: 4.1 mEq/L (ref 3.5–5.1)
Sodium: 141 mEq/L (ref 135–145)
TCO2: 30 mmol/L (ref 0–100)

## 2013-12-19 MED ORDER — PENICILLIN G BENZATHINE 1200000 UNIT/2ML IM SUSP
1.2000 10*6.[IU] | Freq: Once | INTRAMUSCULAR | Status: AC
  Administered 2013-12-20: 1.2 10*6.[IU] via INTRAMUSCULAR
  Filled 2013-12-19: qty 2

## 2013-12-19 MED ORDER — IOHEXOL 300 MG/ML  SOLN
75.0000 mL | Freq: Once | INTRAMUSCULAR | Status: AC | PRN
  Administered 2013-12-19: 75 mL via INTRAVENOUS

## 2013-12-19 MED ORDER — ONDANSETRON 4 MG PO TBDP
4.0000 mg | ORAL_TABLET | Freq: Once | ORAL | Status: AC
  Administered 2013-12-19: 4 mg via ORAL
  Filled 2013-12-19: qty 1

## 2013-12-19 MED ORDER — MORPHINE SULFATE 4 MG/ML IJ SOLN
4.0000 mg | Freq: Once | INTRAMUSCULAR | Status: AC
  Administered 2013-12-19: 4 mg via INTRAVENOUS
  Filled 2013-12-19: qty 1

## 2013-12-19 MED ORDER — ONDANSETRON HCL 4 MG/2ML IJ SOLN
4.0000 mg | Freq: Once | INTRAMUSCULAR | Status: AC
  Administered 2013-12-19: 4 mg via INTRAVENOUS
  Filled 2013-12-19: qty 2

## 2013-12-19 MED ORDER — HYDROCODONE-ACETAMINOPHEN 7.5-325 MG/15ML PO SOLN: 15.0000 mL | Freq: Four times a day (QID) | ORAL | Status: DC | PRN

## 2013-12-19 MED ORDER — SODIUM CHLORIDE 0.9 % IV BOLUS (SEPSIS)
500.0000 mL | Freq: Once | INTRAVENOUS | Status: AC
  Administered 2013-12-19: 500 mL via INTRAVENOUS

## 2013-12-19 NOTE — ED Notes (Signed)
Pt c/o throat pain after having tonsils removed on Friday and [facility]; pt sts thinks infected; pt sts had uvula tacked as well; no distress noted

## 2013-12-19 NOTE — ED Notes (Signed)
Patient transported to CT 

## 2013-12-19 NOTE — ED Notes (Signed)
Report given to Paul RN.

## 2013-12-19 NOTE — ED Notes (Signed)
Pt c/o nausea.  

## 2013-12-19 NOTE — ED Notes (Signed)
Patient transported back from CT 

## 2013-12-19 NOTE — ED Notes (Signed)
Patient states he feels dehydrated

## 2013-12-19 NOTE — ED Provider Notes (Signed)
CSN: [id number]     Arrival date & time 12/19/13  1650 History   First MD Initiated Contact with Patient 12/19/13 1950     Chief Complaint  Patient presents with  . Sore Throat   (Consider location/radiation/quality/duration/timing/severity/associated sxs/prior Treatment) Patient is a 52 y.o. male presenting with pharyngitis. The history is provided by the patient.  Sore Throat Pertinent negatives include no abdominal pain and no shortness of breath.   patient had a tonsillectomy and uvula "tacking" at [facility] on Friday. He states he was doing well up until yesterday when he began to have more pain in his throat. He states that it has gotten worse today also. He states he feels swelling in his anterior neck now. He has pain with swallowing. No difficulty breathing. He states the pain is vastly increased yesterday compared to previous. No fevers. No coughing.  Past Medical History  Diagnosis Date  . Diabetes mellitus   . Hypertension   . Sleep apnea    History reviewed. No pertinent past surgical history. History reviewed. No pertinent family history. History  Substance Use Topics  . Smoking status: Never Smoker   . Smokeless tobacco: Not on file  . Alcohol Use: No    Review of Systems  Constitutional: Negative for appetite change.  HENT: Positive for sore throat. Negative for voice change.   Eyes: Negative for pain.  Respiratory: Negative for cough and shortness of breath.   Gastrointestinal: Negative for abdominal pain.  Musculoskeletal: Negative for back pain.  Skin: Negative for wound.    Allergies  Pravastatin  Home Medications   Current Outpatient Rx  Name  Route  Sig  Dispense  Refill  . buPROPion (WELLBUTRIN SR) 200 MG 12 hr tablet   Oral   Take 200 mg by mouth 2 (two) times daily.           . cetirizine (ZYRTEC) 10 MG tablet   Oral   Take 10 mg by mouth daily.         Marland Kitchen lisinopril-hydrochlorothiazide (PRINZIDE,ZESTORETIC) 20-25 MG per  tablet   Oral   Take 1 tablet by mouth daily.           . metFORMIN (GLUCOPHAGE) 1000 MG tablet   Oral   Take 1,000 mg by mouth 2 (two) times daily with a meal.         . simvastatin (ZOCOR) 40 MG tablet   Oral   Take 40 mg by mouth every evening.         . Tamsulosin HCl (FLOMAX) 0.4 MG CAPS   Oral   Take 0.4 mg by mouth daily after supper.           . verapamil (CALAN) 80 MG tablet   Oral   Take 80 mg by mouth 2 (two) times daily.         Marland Kitchen HYDROcodone-acetaminophen (HYCET) 7.5-325 mg/15 ml solution   Oral   Take 15 mLs by mouth every 6 (six) hours as needed for moderate pain.   120 mL   0    BP 143/94  Pulse 85  Temp(Src) 98 F (36.7 C) (Oral)  Resp 18  Wt 210 lb 4.8 oz (95.391 kg)  SpO2 100% Physical Exam  Nursing note and vitals reviewed. Constitutional: He is oriented to person, place, and time. He appears well-developed and well-nourished.  HENT:  Head: Normocephalic and atraumatic.  Posterior pharyngeal erythema. No bleeding. There is some whiteness on his uvula area.  Eyes: EOM are  normal. Pupils are equal, round, and reactive to light.  Neck: Normal range of motion.  Mild anterior swelling between neck and lower jaw.  Cardiovascular: Normal rate, regular rhythm and normal heart sounds.   No murmur heard. Pulmonary/Chest: Effort normal and breath sounds normal.  Abdominal: Soft. Bowel sounds are normal. He exhibits no distension. There is no tenderness. There is no rebound and no guarding.  Neurological: He is alert and oriented to person, place, and time. No cranial nerve deficit.  Skin: Skin is warm and dry.  Psychiatric: He has a normal mood and affect.    ED Course  Procedures (including critical care time) Labs Review Labs Reviewed  GLUCOSE, CAPILLARY - Abnormal; Notable for the following:    Glucose-Capillary 124 (*)    All other components within normal limits  POCT I-STAT, CHEM 8 - Abnormal; Notable for the following:    BUN 24  (*)    Glucose, Bld 109 (*)    All other components within normal limits  CBC WITH DIFFERENTIAL   Imaging Review Ct Soft Tissue Neck W Contrast  12/19/2013   CLINICAL DATA:  Sore throat.  Tonsillectomy with swelling.  EXAM: CT NECK WITH CONTRAST  TECHNIQUE: Multidetector CT imaging of the neck was performed using the standard protocol following the bolus administration of intravenous contrast.  CONTRAST:  75mL OMNIPAQUE IOHEXOL 300 MG/ML  SOLN  COMPARISON:  None.  FINDINGS: Major vessels of the neck appear within normal limits. There is left carotid atherosclerosis at the bifurcation extending into the left internal carotid artery. Visualized intracranial structures appear normal. There is no hematoma or bear weight compression. No soft tissue abscess is identified. Enlarged level 2 lymph nodes are present on the left (image 51 series 2). Prevertebral soft tissues appear within normal limits. Cervical spondylosis is present. Artifact is present over the floor of the mouth associated with dental hardware. Carious dentition is incidentally noted in the mandible.  IMPRESSION: No acute abnormality. Negative for soft tissue abscess. Nonspecific left level 2 lymphadenopathy which may be reactive to upper respiratory process.   Electronically Signed   By: Andreas Newport M.D.   On: 12/19/2013 22:30    EKG Interpretation   None       MDM   1. Post-operative pain    Patient with pain after tonsillectomy. Foul breath. CT no abscess. Will give pain meds and abx. ENT follow up    Juliet Rude. Rubin Payor, MD 12/20/13 [phone]

## 2013-12-19 NOTE — ED Notes (Signed)
Pt taken to radiology

## 2014-03-21 DIAGNOSIS — R0981 Nasal congestion: Secondary | ICD-10-CM | POA: Insufficient documentation

## 2014-04-04 ENCOUNTER — Other Ambulatory Visit: Payer: Self-pay | Admitting: Orthopedic Surgery

## 2014-04-19 NOTE — Pre-Procedure Instructions (Addendum)
[name] [name]  04/19/2014   Your procedure is scheduled on:  Thursday, May 7.  [name]/Entrance "A" at 5:30 AM.  Call this number if you have problems the morning of surgery: [phone]   Remember:   Do not eat food or drink liquids after midnight Wednesday, May 6.   Take these medicines the morning of surgery with A SIP OF WATER: buPROPion (WELLBUTRIN SR), Tamsulosin HCl (FLOMAX), verapamil (CALAN), cetirizine (ZYRTEC).              Take if needed:HYDROcodone-acetaminophen (HYCET).   Do not wear jewelry, make-up or nail polish.  Do not wear lotions, powders, or perfumes.    Men may shave face and neck.  Do not bring valuables to the hospital.  [facility] is not responsible for any belongings or valuables.               Contacts, dentures or bridgework may not be worn into surgery.  Leave suitcase in the car. After surgery it may be brought to your room.  For patients admitted to the hospital, discharge time is determined by your treatment team.               Patients discharged the day of surgery will not be allowed to drive home.     Special Instructions: [place] - Preparing for Surgery  Before surgery, you can play an important role.  Because skin is not sterile, your skin needs to be as free of germs as possible.  You can reduce the number of germs on you skin by washing with CHG (chlorahexidine gluconate) soap before surgery.  CHG is an antiseptic cleaner which kills germs and bonds with the skin to continue killing germs even after washing.  Please DO NOT use if you have an allergy to CHG or antibacterial soaps.  If your skin becomes reddened/irritated stop using the CHG and inform your nurse when you arrive at Short Stay.  Do not shave (including legs and underarms) for at least 48 hours prior to the first CHG shower.  You may shave your face.  Please follow these instructions carefully:   1.  Shower with CHG Soap the night  before surgery and the                                morning of Surgery.  2.  If you choose to wash your hair, wash your hair first as usual with your       normal shampoo.  3.  After you shampoo, rinse your hair and body thoroughly to remove the                      Shampoo.  4.  Use CHG as you would any other liquid soap.  You can apply chg directly       to the skin and wash gently with scrungie or a clean washcloth.  5.  Apply the CHG Soap to your body ONLY FROM THE NECK DOWN.        Do not use on open wounds or open sores.  Avoid contact with your eyes,       ears, mouth and genitals (private parts).  Wash genitals (private parts)       with your normal soap.  6.  Wash thoroughly, paying special attention to the area where your surgery  will be performed.  7.  Thoroughly rinse your body with warm water from the neck down.  8.  DO NOT shower/wash with your normal soap after using and rinsing off       the CHG Soap.  9.  Pat yourself dry with a clean towel.            10.  Wear clean pajamas.            11.  Place clean sheets on your bed the night of your first shower and do not        sleep with pets.  Day of Surgery  Do not apply any lotions/deoderants the morning of surgery.  Please wear clean clothes to the hospital/surgery center.      Please read over the following fact sheets that you were given: Pain Booklet, Coughing and Deep Breathing, Blood Transfusion Information and Surgical Site Infection Prevention

## 2014-04-20 ENCOUNTER — Encounter (HOSPITAL_COMMUNITY)
Admission: RE | Admit: 2014-04-20 | Discharge: 2014-04-20 | Disposition: A | Discharge status: Home / Self Care | Payer: Worker's Compensation | Source: Ambulatory Visit | Attending: Orthopedic Surgery | Admitting: Orthopedic Surgery

## 2014-04-20 ENCOUNTER — Encounter (HOSPITAL_COMMUNITY): Payer: Self-pay

## 2014-04-20 DIAGNOSIS — Z01818 Encounter for other preprocedural examination: Secondary | ICD-10-CM | POA: Insufficient documentation

## 2014-04-20 DIAGNOSIS — Z01812 Encounter for preprocedural laboratory examination: Secondary | ICD-10-CM | POA: Insufficient documentation

## 2014-04-20 HISTORY — DX: Headache: R51

## 2014-04-20 HISTORY — DX: Major depressive disorder, single episode, unspecified: F32.9

## 2014-04-20 HISTORY — DX: Other allergic rhinitis: J30.89

## 2014-04-20 HISTORY — DX: Depression, unspecified: F32.A

## 2014-04-20 LAB — URINALYSIS, ROUTINE W REFLEX MICROSCOPIC
Bilirubin Urine: NEGATIVE
Glucose, UA: >1000 mg/dL — AB
Hgb urine dipstick: NEGATIVE
Ketones, ur: 15 mg/dL — AB
Leukocytes, UA: NEGATIVE
Nitrite: NEGATIVE
Protein, ur: NEGATIVE mg/dL
Specific Gravity, Urine: 1.028 (ref 1.005–1.030)
Urobilinogen, UA: 0.2 mg/dL (ref 0.0–1.0)
pH: 5.5 (ref 5.0–8.0)

## 2014-04-20 LAB — COMPREHENSIVE METABOLIC PANEL
ALT: 37 U/L (ref 0–53)
AST: 23 U/L (ref 0–37)
Albumin: 4 g/dL (ref 3.5–5.2)
Alkaline Phosphatase: 56 U/L (ref 39–117)
BUN: 28 mg/dL — ABNORMAL HIGH (ref 6–23)
CO2: 24 mEq/L (ref 19–32)
Calcium: 10.4 mg/dL (ref 8.4–10.5)
Chloride: 103 mEq/L (ref 96–112)
Creatinine, Ser: 0.94 mg/dL (ref 0.50–1.35)
GFR calc Af Amer: >90 mL/min (ref >90)
GFR calc non Af Amer: >90 mL/min (ref >90)
Glucose, Bld: 238 mg/dL — ABNORMAL HIGH (ref 70–99)
Potassium: 4.4 mEq/L (ref 3.7–5.3)
Sodium: 142 mEq/L (ref 137–147)
Total Bilirubin: 0.3 mg/dL (ref 0.3–1.2)
Total Protein: 7.3 g/dL (ref 6.0–8.3)

## 2014-04-20 LAB — PROTIME-INR
INR: 0.9 (ref 0.00–1.49)
Prothrombin Time: 12 seconds (ref 11.6–15.2)

## 2014-04-20 LAB — CBC WITH DIFFERENTIAL/PLATELET
Basophils Absolute: 0.1 10*3/uL (ref 0.0–0.1)
Basophils Relative: 1 % (ref 0–1)
Eosinophils Absolute: 0.4 10*3/uL (ref 0.0–0.7)
Eosinophils Relative: 5 % (ref 0–5)
HCT: 44.4 % (ref 39.0–52.0)
Hemoglobin: 15.1 g/dL (ref 13.0–17.0)
Lymphocytes Relative: 28 % (ref 12–46)
Lymphs Abs: 2.1 10*3/uL (ref 0.7–4.0)
MCH: 30.3 pg (ref 26.0–34.0)
MCHC: 34 g/dL (ref 30.0–36.0)
MCV: 89.2 fL (ref 78.0–100.0)
Monocytes Absolute: 0.5 10*3/uL (ref 0.1–1.0)
Monocytes Relative: 7 % (ref 3–12)
Neutro Abs: 4.4 10*3/uL (ref 1.7–7.7)
Neutrophils Relative %: 59 % (ref 43–77)
Platelets: 228 10*3/uL (ref 150–400)
RBC: 4.98 MIL/uL (ref 4.22–5.81)
RDW: 12.8 % (ref 11.5–15.5)
WBC: 7.4 10*3/uL (ref 4.0–10.5)

## 2014-04-20 LAB — SURGICAL PCR SCREEN
MRSA, PCR: NEGATIVE
Staphylococcus aureus: NEGATIVE

## 2014-04-20 LAB — APTT: aPTT: 27 seconds (ref 24–37)

## 2014-04-20 LAB — URINE MICROSCOPIC-ADD ON

## 2014-04-20 LAB — ABO/RH: ABO/RH(D): A POS

## 2014-04-20 LAB — TYPE AND SCREEN
ABO/RH(D): A POS
Antibody Screen: NEGATIVE

## 2014-04-22 ENCOUNTER — Encounter (HOSPITAL_COMMUNITY): Payer: Self-pay | Admitting: Emergency Medicine

## 2014-04-22 ENCOUNTER — Emergency Department (INDEPENDENT_AMBULATORY_CARE_PROVIDER_SITE_OTHER)
Admission: EM | Admit: 2014-04-22 | Discharge: 2014-04-22 | Disposition: A | Discharge status: Home / Self Care | Payer: Self-pay | Source: Home / Self Care | Attending: Family Medicine | Admitting: Family Medicine

## 2014-04-22 DIAGNOSIS — J329 Chronic sinusitis, unspecified: Secondary | ICD-10-CM

## 2014-04-22 HISTORY — DX: Other allergy status, other than to drugs and biological substances: Z91.09

## 2014-04-22 MED ORDER — TRAMADOL HCL 50 MG PO TABS: 50.0000 mg | ORAL_TABLET | Freq: Four times a day (QID) | ORAL | Status: DC | PRN

## 2014-04-22 MED ORDER — PREDNISONE 10 MG PO TABS: 30.0000 mg | ORAL_TABLET | Freq: Every day | ORAL | Status: DC

## 2014-04-22 MED ORDER — AMOXICILLIN-POT CLAVULANATE 875-125 MG PO TABS: 1.0000 | ORAL_TABLET | Freq: Two times a day (BID) | ORAL | Status: DC

## 2014-04-22 NOTE — ED Notes (Signed)
States normally has sinus congestion, but "this is over the top".  Started not feeling well 6 days ago, and has gotten progressively worse.  C/O significant facial pressure/congestion, postnasal drainage, cough from drainage, HA.  Has tried Benadryl and his normal Zyrtec and Flonase, but continues to get worse.  Denies fevers.

## 2014-04-22 NOTE — Discharge Instructions (Signed)
Thank you for coming in today. Take medicine as directed.  Call or go to the emergency room if you get worse, have trouble breathing, have chest pains, or palpitations.   Sinusitis Sinusitis is redness, soreness, and swelling (inflammation) of the paranasal sinuses. Paranasal sinuses are air pockets within the bones of your face (beneath the eyes, the middle of the forehead, or above the eyes). In healthy paranasal sinuses, mucus is able to drain out, and air is able to circulate through them by way of your nose. However, when your paranasal sinuses are inflamed, mucus and air can become trapped. This can allow bacteria and other germs to grow and cause infection. Sinusitis can develop quickly and last only a short time (acute) or continue over a long period (chronic). Sinusitis that lasts for more than 12 weeks is considered chronic.  CAUSES  Causes of sinusitis include:  Allergies.  Structural abnormalities, such as displacement of the cartilage that separates your nostrils (deviated septum), which can decrease the air flow through your nose and sinuses and affect sinus drainage.  Functional abnormalities, such as when the small hairs (cilia) that line your sinuses and help remove mucus do not work properly or are not present. SYMPTOMS  Symptoms of acute and chronic sinusitis are the same. The primary symptoms are pain and pressure around the affected sinuses. Other symptoms include:  Upper toothache.  Earache.  Headache.  Bad breath.  Decreased sense of smell and taste.  A cough, which worsens when you are lying flat.  Fatigue.  Fever.  Thick drainage from your nose, which often is green and may contain pus (purulent).  Swelling and warmth over the affected sinuses. DIAGNOSIS  Your caregiver will perform a physical exam. During the exam, your caregiver may:  Look in your nose for signs of abnormal growths in your nostrils (nasal polyps).  Tap over the affected sinus to  check for signs of infection.  View the inside of your sinuses (endoscopy) with a special imaging device with a light attached (endoscope), which is inserted into your sinuses. If your caregiver suspects that you have chronic sinusitis, one or more of the following tests may be recommended:  Allergy tests.  Nasal culture A sample of mucus is taken from your nose and sent to a lab and screened for bacteria.  Nasal cytology A sample of mucus is taken from your nose and examined by your caregiver to determine if your sinusitis is related to an allergy. TREATMENT  Most cases of acute sinusitis are related to a viral infection and will resolve on their own within 10 days. Sometimes medicines are prescribed to help relieve symptoms (pain medicine, decongestants, nasal steroid sprays, or saline sprays).  However, for sinusitis related to a bacterial infection, your caregiver will prescribe antibiotic medicines. These are medicines that will help kill the bacteria causing the infection.  Rarely, sinusitis is caused by a fungal infection. In theses cases, your caregiver will prescribe antifungal medicine. For some cases of chronic sinusitis, surgery is needed. Generally, these are cases in which sinusitis recurs more than 3 times per year, despite other treatments. HOME CARE INSTRUCTIONS   Drink plenty of water. Water helps thin the mucus so your sinuses can drain more easily.  Use a humidifier.  Inhale steam 3 to 4 times a day (for example, sit in the bathroom with the shower running).  Apply a warm, moist washcloth to your face 3 to 4 times a day, or as directed by your caregiver.  Use saline nasal sprays to help moisten and clean your sinuses.  Take over-the-counter or prescription medicines for pain, discomfort, or fever only as directed by your caregiver. SEEK IMMEDIATE MEDICAL CARE IF:  You have increasing pain or severe headaches.  You have nausea, vomiting, or drowsiness.  You have  swelling around your face.  You have vision problems.  You have a stiff neck.  You have difficulty breathing. MAKE SURE YOU:   Understand these instructions.  Will watch your condition.  Will get help right away if you are not doing well or get worse. Document Released: 12/14/2005 Document Revised: 03/07/2012 Document Reviewed: 12/29/2011 [facility] Patient Information 2014 [employer] [place].

## 2014-04-22 NOTE — ED Provider Notes (Signed)
[name] PiesDavid Mangen is a 53 y.o. male who presents to Urgent Care today for significant right-sided facial pain and pressure. Symptoms worsening over the past 6 days. Symptoms are consistent with previous sinusitis episodes. Additionally notes cough headache. Right is worse than left. Patient also notes clear nasal discharge. No fevers or chills nausea vomiting or diarrhea. Patient has tried Benadryl, Zyrtec, Flonase which have helped a little.    Past Medical History  Diagnosis Date  . Diabetes mellitus   . Hypertension   . Sleep apnea     BiPAP  . Environmental and seasonal allergies   . Depression   . Headache(784.0)     sinus and migraines  . Environmental allergies    History  Substance Use Topics  . Smoking status: Former Smoker -- 1.00 packs/day for 10 years    Quit date: 04/20/1992  . Smokeless tobacco: Never Used  . Alcohol Use: No   ROS as above Medications: No current facility-administered medications for this encounter.   Current Outpatient Prescriptions  Medication Sig Dispense Refill  . buPROPion (WELLBUTRIN SR) 200 MG 12 hr tablet Take 200 mg by mouth 2 (two) times daily.        . cetirizine (ZYRTEC) 10 MG tablet Take 10 mg by mouth daily.      . fluticasone (FLONASE) 50 MCG/ACT nasal spray Place 1 spray into both nostrils daily.      Marland Kitchen. lisinopril-hydrochlorothiazide (PRINZIDE,ZESTORETIC) 20-25 MG per tablet Take 1 tablet by mouth daily.        . metFORMIN (GLUCOPHAGE) 1000 MG tablet Take 1,000 mg by mouth 2 (two) times daily with a meal.      . simvastatin (ZOCOR) 40 MG tablet Take 40 mg by mouth every evening.      . Tamsulosin HCl (FLOMAX) 0.4 MG CAPS Take 0.4 mg by mouth daily after supper.        . verapamil (CALAN) 120 MG tablet Take 120 mg by mouth 2 (two) times daily.      Marland Kitchen. amoxicillin-clavulanate (AUGMENTIN) 875-125 MG per tablet Take 1 tablet by mouth every 12 (twelve) hours.  14 tablet  0  . predniSONE (DELTASONE) 10 MG tablet Take 3 tablets (30 mg total) by  mouth daily.  15 tablet  0  . traMADol (ULTRAM) 50 MG tablet Take 1 tablet (50 mg total) by mouth every 6 (six) hours as needed.  15 tablet  0    Exam:  BP 152/84  Pulse 96  Temp(Src) 98.6 F (37 C) (Oral)  Resp 16  SpO2 100% Gen: Well NAD HEENT: EOMI,  MMM no nasal discharge with inflamed nasal turbinates bilaterally. Tender palpation right maxillary and frontal sinuses. Tympanic membranes are normal appearing bilaterally. Lungs: Normal work of breathing. CTABL Heart: RRR no MRG Abd: NABS, Soft. NT, ND Exts: Brisk capillary refill, warm and well perfused.   No results found for this or any previous visit (from the past 24 hour(s)). No results found.  Assessment and Plan: 53 y.o. male with sinusitis. Plan to treat with prednisone, Augmentin and tramadol. Continue Flonase nasal spray.  Discussed warning signs or symptoms. Please see discharge instructions. Patient expresses understanding.    Rodolph BongEvan S Corynn Solberg, MD 04/22/14 1740

## 2014-04-23 NOTE — Progress Notes (Signed)
Anesthesiology Chart Review:  53 year old male for R. SI joint fusion 05/03/14 by Dr. Yevette Edwardsumonski  Problems:  1. Type 2 DM  2. Sleep apnea, BIPAP prescribed, does not use. Baseline AHI no noted on sleep study 06/18/10.  AHI  118 on study from 2006. 3. Chest pain 2008, cardiac cath 09/06/07- mild non-obstructive CAD, EF 45-50%    Unable to climb stairs due to SI joint pain, O/W no limitations to activity. No chest pain, No SOB ECG- mild nonspecific ST changes. 4. Hypertension  Labs notable for glucose 248  Patient will be evaluated on DOS by assigned anesthesiologist, consider post-op monitoring due to OSA.  Kipp Broodavid Tequan Redmon

## 2014-05-01 MED ORDER — CEFAZOLIN SODIUM-DEXTROSE 2-3 GM-% IV SOLR
2.0000 g | INTRAVENOUS | Status: DC
  Filled 2014-05-01: qty 50

## 2014-05-02 ENCOUNTER — Ambulatory Visit (HOSPITAL_COMMUNITY): Payer: Worker's Compensation | Admitting: Anesthesiology

## 2014-05-02 ENCOUNTER — Observation Stay (HOSPITAL_COMMUNITY)
Admission: RE | Admit: 2014-05-02 | Discharge: 2014-05-03 | Disposition: A | Discharge status: Home / Self Care | Payer: Worker's Compensation | Source: Ambulatory Visit | Attending: Orthopedic Surgery | Admitting: Orthopedic Surgery

## 2014-05-02 ENCOUNTER — Ambulatory Visit (HOSPITAL_COMMUNITY): Payer: Worker's Compensation

## 2014-05-02 ENCOUNTER — Encounter (HOSPITAL_COMMUNITY): Payer: Worker's Compensation | Admitting: Anesthesiology

## 2014-05-02 ENCOUNTER — Encounter (HOSPITAL_COMMUNITY)
Admission: RE | Disposition: A | Discharge status: Home / Self Care | Payer: Self-pay | Source: Ambulatory Visit | Attending: Orthopedic Surgery

## 2014-05-02 ENCOUNTER — Encounter (HOSPITAL_COMMUNITY): Payer: Self-pay | Admitting: *Deleted

## 2014-05-02 DIAGNOSIS — F329 Major depressive disorder, single episode, unspecified: Secondary | ICD-10-CM | POA: Insufficient documentation

## 2014-05-02 DIAGNOSIS — M533 Sacrococcygeal disorders, not elsewhere classified: Principal | ICD-10-CM | POA: Diagnosis present

## 2014-05-02 DIAGNOSIS — I2510 Atherosclerotic heart disease of native coronary artery without angina pectoris: Secondary | ICD-10-CM | POA: Insufficient documentation

## 2014-05-02 DIAGNOSIS — E119 Type 2 diabetes mellitus without complications: Secondary | ICD-10-CM | POA: Insufficient documentation

## 2014-05-02 DIAGNOSIS — F3289 Other specified depressive episodes: Secondary | ICD-10-CM | POA: Insufficient documentation

## 2014-05-02 DIAGNOSIS — G43909 Migraine, unspecified, not intractable, without status migrainosus: Secondary | ICD-10-CM | POA: Insufficient documentation

## 2014-05-02 DIAGNOSIS — J301 Allergic rhinitis due to pollen: Secondary | ICD-10-CM | POA: Insufficient documentation

## 2014-05-02 DIAGNOSIS — I10 Essential (primary) hypertension: Secondary | ICD-10-CM | POA: Insufficient documentation

## 2014-05-02 DIAGNOSIS — G4730 Sleep apnea, unspecified: Secondary | ICD-10-CM | POA: Insufficient documentation

## 2014-05-02 DIAGNOSIS — Z87891 Personal history of nicotine dependence: Secondary | ICD-10-CM | POA: Insufficient documentation

## 2014-05-02 HISTORY — PX: SACROILIAC JOINT FUSION: SHX6088

## 2014-05-02 LAB — GLUCOSE, CAPILLARY
Glucose-Capillary: 241 mg/dL — ABNORMAL HIGH (ref 70–99)
Glucose-Capillary: 175 mg/dL — ABNORMAL HIGH (ref 70–99)
Glucose-Capillary: 142 mg/dL — ABNORMAL HIGH (ref 70–99)
Glucose-Capillary: 285 mg/dL — ABNORMAL HIGH (ref 70–99)

## 2014-05-02 SURGERY — SACROILIAC JOINT FUSION
Anesthesia: General | Site: Back | Laterality: Right

## 2014-05-02 MED ORDER — 0.9 % SODIUM CHLORIDE (POUR BTL) OPTIME
TOPICAL | Status: DC | PRN
  Administered 2014-05-02: 1000 mL

## 2014-05-02 MED ORDER — DEXAMETHASONE SODIUM PHOSPHATE 10 MG/ML IJ SOLN
INTRAMUSCULAR | Status: DC | PRN
  Administered 2014-05-02: 8 mg via INTRAVENOUS

## 2014-05-02 MED ORDER — PHENYLEPHRINE HCL 10 MG/ML IJ SOLN
10.0000 mg | INTRAMUSCULAR | Status: DC | PRN
  Administered 2014-05-02: 10 ug/min via INTRAVENOUS

## 2014-05-02 MED ORDER — PROPOFOL 10 MG/ML IV BOLUS
INTRAVENOUS | Status: AC
  Filled 2014-05-02: qty 20

## 2014-05-02 MED ORDER — PREDNISONE 20 MG PO TABS: 20.0000 mg | ORAL_TABLET | Freq: Once | ORAL | Status: DC

## 2014-05-02 MED ORDER — INSULIN ASPART 100 UNIT/ML ~~LOC~~ SOLN
0.0000 [IU] | Freq: Every day | SUBCUTANEOUS | Status: DC
  Administered 2014-05-02: 3 [IU] via SUBCUTANEOUS

## 2014-05-02 MED ORDER — PROPOFOL 10 MG/ML IV BOLUS
INTRAVENOUS | Status: DC | PRN
  Administered 2014-05-02: 200 mg via INTRAVENOUS

## 2014-05-02 MED ORDER — AMOXICILLIN-POT CLAVULANATE 875-125 MG PO TABS
1.0000 | ORAL_TABLET | Freq: Two times a day (BID) | ORAL | Status: DC
  Administered 2014-05-02 – 2014-05-03 (×2): 1 via ORAL
  Filled 2014-05-02 (×4): qty 1

## 2014-05-02 MED ORDER — PHENYLEPHRINE HCL 10 MG/ML IJ SOLN
INTRAMUSCULAR | Status: DC | PRN
  Administered 2014-05-02: 80 ug via INTRAVENOUS
  Administered 2014-05-02 (×2): 120 ug via INTRAVENOUS
  Administered 2014-05-02: 80 ug via INTRAVENOUS

## 2014-05-02 MED ORDER — METFORMIN HCL 500 MG PO TABS
1000.0000 mg | ORAL_TABLET | Freq: Two times a day (BID) | ORAL | Status: DC
  Administered 2014-05-03: 1000 mg via ORAL
  Filled 2014-05-02 (×3): qty 2

## 2014-05-02 MED ORDER — ACETAMINOPHEN 650 MG RE SUPP: 650.0000 mg | RECTAL | Status: DC | PRN

## 2014-05-02 MED ORDER — PHENOL 1.4 % MT LIQD: 1.0000 | OROMUCOSAL | Status: DC | PRN

## 2014-05-02 MED ORDER — DIAZEPAM 5 MG PO TABS
5.0000 mg | ORAL_TABLET | Freq: Four times a day (QID) | ORAL | Status: DC | PRN
  Administered 2014-05-02 – 2014-05-03 (×2): 5 mg via ORAL
  Filled 2014-05-02: qty 1

## 2014-05-02 MED ORDER — ATORVASTATIN CALCIUM 20 MG PO TABS
20.0000 mg | ORAL_TABLET | Freq: Every day | ORAL | Status: DC
  Administered 2014-05-02: 20 mg via ORAL
  Filled 2014-05-02 (×2): qty 1

## 2014-05-02 MED ORDER — BUPROPION HCL ER (SR) 100 MG PO TB12
200.0000 mg | ORAL_TABLET | Freq: Two times a day (BID) | ORAL | Status: DC
  Administered 2014-05-02 – 2014-05-03 (×2): 200 mg via ORAL
  Filled 2014-05-02 (×3): qty 2

## 2014-05-02 MED ORDER — DEXAMETHASONE SODIUM PHOSPHATE 4 MG/ML IJ SOLN
INTRAMUSCULAR | Status: AC
  Filled 2014-05-02: qty 2

## 2014-05-02 MED ORDER — LISINOPRIL-HYDROCHLOROTHIAZIDE 20-25 MG PO TABS: 1.0000 | ORAL_TABLET | Freq: Every day | ORAL | Status: DC

## 2014-05-02 MED ORDER — ONDANSETRON HCL 4 MG/2ML IJ SOLN
INTRAMUSCULAR | Status: AC
  Filled 2014-05-02: qty 2

## 2014-05-02 MED ORDER — OXYCODONE-ACETAMINOPHEN 5-325 MG PO TABS
1.0000 | ORAL_TABLET | ORAL | Status: DC | PRN
  Administered 2014-05-02 – 2014-05-03 (×4): 2 via ORAL
  Filled 2014-05-02 (×3): qty 2

## 2014-05-02 MED ORDER — LACTATED RINGERS IV SOLN
INTRAVENOUS | Status: DC | PRN
  Administered 2014-05-02 (×3): via INTRAVENOUS

## 2014-05-02 MED ORDER — DOCUSATE SODIUM 100 MG PO CAPS
100.0000 mg | ORAL_CAPSULE | Freq: Two times a day (BID) | ORAL | Status: DC
  Administered 2014-05-02 – 2014-05-03 (×2): 100 mg via ORAL
  Filled 2014-05-02 (×3): qty 1

## 2014-05-02 MED ORDER — SODIUM CHLORIDE 0.9 % IJ SOLN
INTRAMUSCULAR | Status: AC
  Filled 2014-05-02: qty 10

## 2014-05-02 MED ORDER — LORATADINE 10 MG PO TABS
10.0000 mg | ORAL_TABLET | Freq: Every day | ORAL | Status: DC
  Administered 2014-05-03: 10 mg via ORAL
  Filled 2014-05-02: qty 1

## 2014-05-02 MED ORDER — ACETAMINOPHEN 325 MG PO TABS: 650.0000 mg | ORAL_TABLET | ORAL | Status: DC | PRN

## 2014-05-02 MED ORDER — OXYCODONE HCL 5 MG PO TABS
5.0000 mg | ORAL_TABLET | Freq: Once | ORAL | Status: AC | PRN
  Administered 2014-05-02: 5 mg via ORAL

## 2014-05-02 MED ORDER — LISINOPRIL 20 MG PO TABS
20.0000 mg | ORAL_TABLET | Freq: Every day | ORAL | Status: DC
  Administered 2014-05-03: 20 mg via ORAL
  Filled 2014-05-02 (×2): qty 1

## 2014-05-02 MED ORDER — HYDROCHLOROTHIAZIDE 25 MG PO TABS
25.0000 mg | ORAL_TABLET | Freq: Every day | ORAL | Status: DC
  Administered 2014-05-03: 25 mg via ORAL
  Filled 2014-05-02 (×2): qty 1

## 2014-05-02 MED ORDER — TAMSULOSIN HCL 0.4 MG PO CAPS
0.4000 mg | ORAL_CAPSULE | Freq: Every day | ORAL | Status: DC
  Filled 2014-05-02: qty 1

## 2014-05-02 MED ORDER — SIMVASTATIN 40 MG PO TABS: 40.0000 mg | ORAL_TABLET | Freq: Every evening | ORAL | Status: DC

## 2014-05-02 MED ORDER — BUPROPION HCL ER (SR) 100 MG PO TB12: 200.0000 mg | ORAL_TABLET | Freq: Two times a day (BID) | ORAL | Status: DC

## 2014-05-02 MED ORDER — LACTATED RINGERS IV SOLN
INTRAVENOUS | Status: DC
  Administered 2014-05-02: 13:00:00 via INTRAVENOUS

## 2014-05-02 MED ORDER — BUPIVACAINE-EPINEPHRINE (PF) 0.25% -1:200000 IJ SOLN
INTRAMUSCULAR | Status: DC | PRN
  Administered 2014-05-02: 14 mL

## 2014-05-02 MED ORDER — MIDAZOLAM HCL 2 MG/2ML IJ SOLN
INTRAMUSCULAR | Status: AC
  Filled 2014-05-02: qty 2

## 2014-05-02 MED ORDER — FENTANYL CITRATE 0.05 MG/ML IJ SOLN
INTRAMUSCULAR | Status: DC | PRN
  Administered 2014-05-02 (×3): 50 ug via INTRAVENOUS

## 2014-05-02 MED ORDER — METOCLOPRAMIDE HCL 5 MG/ML IJ SOLN: 10.0000 mg | Freq: Once | INTRAMUSCULAR | Status: DC | PRN

## 2014-05-02 MED ORDER — BUPIVACAINE-EPINEPHRINE (PF) 0.25% -1:200000 IJ SOLN
INTRAMUSCULAR | Status: AC
  Filled 2014-05-02: qty 30

## 2014-05-02 MED ORDER — MIDAZOLAM HCL 5 MG/5ML IJ SOLN
INTRAMUSCULAR | Status: DC | PRN
  Administered 2014-05-02: 2 mg via INTRAVENOUS

## 2014-05-02 MED ORDER — EPHEDRINE SULFATE 50 MG/ML IJ SOLN
INTRAMUSCULAR | Status: AC
  Filled 2014-05-02: qty 1

## 2014-05-02 MED ORDER — LIDOCAINE HCL (CARDIAC) 20 MG/ML IV SOLN
INTRAVENOUS | Status: DC | PRN
  Administered 2014-05-02: 60 mg via INTRAVENOUS

## 2014-05-02 MED ORDER — MORPHINE SULFATE 2 MG/ML IJ SOLN: 1.0000 mg | INTRAMUSCULAR | Status: DC | PRN

## 2014-05-02 MED ORDER — GLYCOPYRROLATE 0.2 MG/ML IJ SOLN
INTRAMUSCULAR | Status: DC | PRN
  Administered 2014-05-02: 0.4 mg via INTRAVENOUS

## 2014-05-02 MED ORDER — CEFAZOLIN SODIUM 1-5 GM-% IV SOLN
1.0000 g | Freq: Three times a day (TID) | INTRAVENOUS | Status: DC
  Administered 2014-05-02: 1 g via INTRAVENOUS
  Filled 2014-05-02: qty 50

## 2014-05-02 MED ORDER — SODIUM CHLORIDE 0.9 % IJ SOLN: 3.0000 mL | INTRAMUSCULAR | Status: DC | PRN

## 2014-05-02 MED ORDER — METOCLOPRAMIDE HCL 5 MG/ML IJ SOLN
INTRAMUSCULAR | Status: AC
  Filled 2014-05-02: qty 2

## 2014-05-02 MED ORDER — FLUTICASONE PROPIONATE 50 MCG/ACT NA SUSP
1.0000 | Freq: Every day | NASAL | Status: DC
  Filled 2014-05-02: qty 16

## 2014-05-02 MED ORDER — SODIUM CHLORIDE 0.9 % IJ SOLN
3.0000 mL | Freq: Two times a day (BID) | INTRAMUSCULAR | Status: DC
  Administered 2014-05-02: 3 mL via INTRAVENOUS

## 2014-05-02 MED ORDER — OXYCODONE-ACETAMINOPHEN 5-325 MG PO TABS
ORAL_TABLET | ORAL | Status: AC
  Filled 2014-05-02: qty 2

## 2014-05-02 MED ORDER — HYDROMORPHONE HCL PF 1 MG/ML IJ SOLN
0.2500 mg | INTRAMUSCULAR | Status: DC | PRN
  Administered 2014-05-02 (×2): 0.5 mg via INTRAVENOUS

## 2014-05-02 MED ORDER — NEOSTIGMINE METHYLSULFATE 10 MG/10ML IV SOLN
INTRAVENOUS | Status: DC | PRN
  Administered 2014-05-02: 3 mg via INTRAVENOUS

## 2014-05-02 MED ORDER — POVIDONE-IODINE 7.5 % EX SOLN
Freq: Once | CUTANEOUS | Status: DC
  Filled 2014-05-02: qty 118

## 2014-05-02 MED ORDER — FENTANYL CITRATE 0.05 MG/ML IJ SOLN
INTRAMUSCULAR | Status: AC
  Filled 2014-05-02: qty 5

## 2014-05-02 MED ORDER — VERAPAMIL HCL 120 MG PO TABS
120.0000 mg | ORAL_TABLET | Freq: Two times a day (BID) | ORAL | Status: DC
  Administered 2014-05-02 – 2014-05-03 (×2): 120 mg via ORAL
  Filled 2014-05-02 (×3): qty 1

## 2014-05-02 MED ORDER — INSULIN ASPART 100 UNIT/ML ~~LOC~~ SOLN
0.0000 [IU] | Freq: Three times a day (TID) | SUBCUTANEOUS | Status: DC
  Administered 2014-05-03: 5 [IU] via SUBCUTANEOUS

## 2014-05-02 MED ORDER — OXYCODONE HCL 5 MG PO TABS
ORAL_TABLET | ORAL | Status: AC
  Filled 2014-05-02: qty 1

## 2014-05-02 MED ORDER — METOCLOPRAMIDE HCL 5 MG/ML IJ SOLN
INTRAMUSCULAR | Status: DC | PRN
  Administered 2014-05-02: 10 mg via INTRAVENOUS

## 2014-05-02 MED ORDER — ARTIFICIAL TEARS OP OINT
TOPICAL_OINTMENT | OPHTHALMIC | Status: DC | PRN
  Administered 2014-05-02: 1 via OPHTHALMIC

## 2014-05-02 MED ORDER — ONDANSETRON HCL 4 MG/2ML IJ SOLN: 4.0000 mg | INTRAMUSCULAR | Status: DC | PRN

## 2014-05-02 MED ORDER — ROCURONIUM BROMIDE 50 MG/5ML IV SOLN
INTRAVENOUS | Status: AC
  Filled 2014-05-02: qty 1

## 2014-05-02 MED ORDER — ONDANSETRON HCL 4 MG/2ML IJ SOLN
INTRAMUSCULAR | Status: DC | PRN
  Administered 2014-05-02: 4 mg via INTRAVENOUS

## 2014-05-02 MED ORDER — METFORMIN HCL 500 MG PO TABS: 1000.0000 mg | ORAL_TABLET | Freq: Two times a day (BID) | ORAL | Status: DC

## 2014-05-02 MED ORDER — ROCURONIUM BROMIDE 100 MG/10ML IV SOLN
INTRAVENOUS | Status: DC | PRN
  Administered 2014-05-02: 50 mg via INTRAVENOUS

## 2014-05-02 MED ORDER — HYDROMORPHONE HCL PF 1 MG/ML IJ SOLN
INTRAMUSCULAR | Status: AC
  Filled 2014-05-02: qty 1

## 2014-05-02 MED ORDER — MENTHOL 3 MG MT LOZG: 1.0000 | LOZENGE | OROMUCOSAL | Status: DC | PRN

## 2014-05-02 MED ORDER — EPHEDRINE SULFATE 50 MG/ML IJ SOLN
INTRAMUSCULAR | Status: DC | PRN
  Administered 2014-05-02 (×3): 10 mg via INTRAVENOUS

## 2014-05-02 MED ORDER — OXYCODONE HCL 5 MG/5ML PO SOLN: 5.0000 mg | Freq: Once | ORAL | Status: AC | PRN

## 2014-05-02 MED ORDER — ALUM & MAG HYDROXIDE-SIMETH 200-200-20 MG/5ML PO SUSP: 30.0000 mL | Freq: Four times a day (QID) | ORAL | Status: DC | PRN

## 2014-05-02 MED ORDER — DIAZEPAM 5 MG PO TABS
ORAL_TABLET | ORAL | Status: AC
Start: 2014-05-02 — End: 2014-05-03
  Filled 2014-05-02: qty 1

## 2014-05-02 SURGICAL SUPPLY — 56 items
APL SKNCLS STERI-STRIP NONHPOA (GAUZE/BANDAGES/DRESSINGS) ×1
BENZOIN TINCTURE PRP APPL 2/3 (GAUZE/BANDAGES/DRESSINGS) ×2 IMPLANT
BLADE 10 SAFETY STRL DISP (BLADE) ×2 IMPLANT
BLADE SURG 10 STRL SS (BLADE) ×2 IMPLANT
BLADE SURG 11 STRL SS (BLADE) ×2 IMPLANT
BLADE SURG ROTATE 9660 (MISCELLANEOUS) ×2 IMPLANT
CANISTER SUCTION 2500CC (MISCELLANEOUS) ×2 IMPLANT
CAP-I-FUSE IMPLANT SYSTEM ×1 IMPLANT
CLSR STERI-STRIP ANTIMIC 1/2X4 (GAUZE/BANDAGES/DRESSINGS) ×1 IMPLANT
COVER SURGICAL LIGHT HANDLE (MISCELLANEOUS) ×4 IMPLANT
DRAPE C-ARM 42X72 X-RAY (DRAPES) ×2 IMPLANT
DRAPE C-ARMOR (DRAPES) ×2 IMPLANT
DRAPE INCISE IOBAN 66X45 STRL (DRAPES) ×2 IMPLANT
DRAPE POUCH INSTRU U-SHP 10X18 (DRAPES) ×2 IMPLANT
DRAPE SURG 17X23 STRL (DRAPES) ×6 IMPLANT
DURAPREP 26ML APPLICATOR (WOUND CARE) ×2 IMPLANT
ELECT CAUTERY BLADE 6.4 (BLADE) ×2 IMPLANT
ELECT REM PT RETURN 9FT ADLT (ELECTROSURGICAL) ×2
ELECTRODE REM PT RTRN 9FT ADLT (ELECTROSURGICAL) ×1 IMPLANT
GAUZE SPONGE 4X4 16PLY XRAY LF (GAUZE/BANDAGES/DRESSINGS) ×2 IMPLANT
GLOVE BIO SURGEON STRL SZ7 (GLOVE) ×2 IMPLANT
GLOVE BIO SURGEON STRL SZ8 (GLOVE) ×2 IMPLANT
GLOVE BIOGEL PI IND STRL 7.0 (GLOVE) ×1 IMPLANT
GLOVE BIOGEL PI IND STRL 8 (GLOVE) ×1 IMPLANT
GLOVE BIOGEL PI INDICATOR 7.0 (GLOVE) ×1
GLOVE BIOGEL PI INDICATOR 8 (GLOVE) ×1
GOWN STRL REUS W/ TWL LRG LVL3 (GOWN DISPOSABLE) ×2 IMPLANT
GOWN STRL REUS W/ TWL XL LVL3 (GOWN DISPOSABLE) ×1 IMPLANT
GOWN STRL REUS W/TWL LRG LVL3 (GOWN DISPOSABLE) ×4
GOWN STRL REUS W/TWL XL LVL3 (GOWN DISPOSABLE) ×2
KIT BASIN OR (CUSTOM PROCEDURE TRAY) ×2 IMPLANT
KIT ROOM TURNOVER OR (KITS) ×2 IMPLANT
MANIFOLD NEPTUNE II (INSTRUMENTS) ×2 IMPLANT
NDL HYPO 25GX1X1/2 BEV (NEEDLE) ×1 IMPLANT
NEEDLE 22X1 1/2 (OR ONLY) (NEEDLE) ×2 IMPLANT
NEEDLE HYPO 25GX1X1/2 BEV (NEEDLE) ×2 IMPLANT
NS IRRIG 1000ML POUR BTL (IV SOLUTION) ×2 IMPLANT
PACK UNIVERSAL I (CUSTOM PROCEDURE TRAY) ×2 IMPLANT
PAD ARMBOARD 7.5X6 YLW CONV (MISCELLANEOUS) ×4 IMPLANT
PENCIL BUTTON HOLSTER BLD 10FT (ELECTRODE) ×2 IMPLANT
SPONGE GAUZE 4X4 12PLY (GAUZE/BANDAGES/DRESSINGS) ×2 IMPLANT
SPONGE LAP 18X18 X RAY DECT (DISPOSABLE) ×2 IMPLANT
STAPLER VISISTAT 35W (STAPLE) ×2 IMPLANT
STRIP CLOSURE SKIN 1/2X4 (GAUZE/BANDAGES/DRESSINGS) ×2 IMPLANT
SUT MNCRL AB 4-0 PS2 18 (SUTURE) ×2 IMPLANT
SUT VIC AB 0 CT1 18XCR BRD 8 (SUTURE) ×1 IMPLANT
SUT VIC AB 0 CT1 8-18 (SUTURE) ×2
SUT VIC AB 2-0 CT2 18 VCP726D (SUTURE) ×2 IMPLANT
SYR BULB IRRIGATION 50ML (SYRINGE) ×2 IMPLANT
SYR CONTROL 10ML LL (SYRINGE) ×2 IMPLANT
TAPE CLOTH SURG 4X10 WHT LF (GAUZE/BANDAGES/DRESSINGS) ×1 IMPLANT
TOWEL OR 17X24 6PK STRL BLUE (TOWEL DISPOSABLE) ×2 IMPLANT
TOWEL OR 17X26 10 PK STRL BLUE (TOWEL DISPOSABLE) ×4 IMPLANT
TUBE CONNECTING 12X1/4 (SUCTIONS) ×2 IMPLANT
WATER STERILE IRR 1000ML POUR (IV SOLUTION) ×2 IMPLANT
YANKAUER SUCT BULB TIP NO VENT (SUCTIONS) ×2 IMPLANT

## 2014-05-02 NOTE — Anesthesia Preprocedure Evaluation (Addendum)
Anesthesia Evaluation  Patient identified by MRN, date of birth, ID band Patient awake    Reviewed: Allergy & Precautions, H&P , NPO status , Patient's Chart, lab work & pertinent test results, reviewed documented beta blocker date and time   Airway Mallampati: II TM Distance: >3 FB Neck ROM: full    Dental   Pulmonary sleep apnea and Continuous Positive Airway Pressure Ventilation , former smoker,  breath sounds clear to auscultation        Cardiovascular hypertension, On Medications + CAD Rhythm:regular     Neuro/Psych  Headaches, PSYCHIATRIC DISORDERS  Neuromuscular disease    GI/Hepatic negative GI ROS, Neg liver ROS,   Endo/Other  diabetes, Oral Hypoglycemic Agents  Renal/GU negative Renal ROS  negative genitourinary   Musculoskeletal   Abdominal   Peds  Hematology negative hematology ROS (+)   Anesthesia Other Findings See surgeon's H&P   Reproductive/Obstetrics negative OB ROS                          Anesthesia Physical Anesthesia Plan  ASA: III  Anesthesia Plan: General   Post-op Pain Management:    Induction: Intravenous  Airway Management Planned: Oral ETT  Additional Equipment:   Intra-op Plan:   Post-operative Plan: Extubation in OR  Informed Consent: I have reviewed the patients History and Physical, chart, labs and discussed the procedure including the risks, benefits and alternatives for the proposed anesthesia with the patient or authorized representative who has indicated his/her understanding and acceptance.   Dental Advisory Given  Plan Discussed with: CRNA and Surgeon  Anesthesia Plan Comments:         Anesthesia Quick Evaluation

## 2014-05-02 NOTE — Progress Notes (Signed)
Orthopedic Tech Progress Note Patient Details:  [name] [name] 1961-03-26 [id number]  Ortho Devices Type of Ortho Device: Crutches Ortho Device/Splint Interventions: Ordered;Adjustment   Jennye Moccasinnthony Craig [name] 05/02/2014, 7:31 PM

## 2014-05-02 NOTE — Anesthesia Procedure Notes (Signed)
Procedure Name: Intubation Date/Time: 05/02/2014 4:21 PM Performed by: Angelica PouSMITH, Talea Manges PIZZICARA Pre-anesthesia Checklist: Patient identified, Patient being monitored, Emergency Drugs available, Timeout performed and Suction available Patient Re-evaluated:Patient Re-evaluated prior to inductionOxygen Delivery Method: Circle system utilized Preoxygenation: Pre-oxygenation with 100% oxygen Intubation Type: IV induction Ventilation: Two handed mask ventilation required and Oral airway inserted - appropriate to patient size Laryngoscope size: Glidescope #3. Grade View: Grade I Tube type: Oral Tube size: 7.5 mm Number of attempts: 1 Airway Equipment and Method: Stylet and Oral airway Placement Confirmation: ETT inserted through vocal cords under direct vision,  breath sounds checked- equal and bilateral and positive ETCO2 Secured at: 23 cm Tube secured with: Tape Dental Injury: Teeth and Oropharynx as per pre-operative assessment  Comments: Elective glidescope intubation d/t reduced thyromental distance and hx severe OSA.

## 2014-05-02 NOTE — Transfer of Care (Signed)
Immediate Anesthesia Transfer of Care Note  Patient: [name] [name]  Procedure(s) Performed: Procedure(s) with comments: SACROILIAC JOINT FUSION (Right) - Right sided sacroiliac joint fusion  Patient Location: PACU  Anesthesia Type:General  Level of Consciousness: awake, alert , oriented and patient cooperative  Airway & Oxygen Therapy: Patient Spontanous Breathing and Patient connected to nasal cannula oxygen  Post-op Assessment: Report given to PACU RN, Post -op Vital signs reviewed and stable and Patient moving all extremities  Post vital signs: Reviewed and stable  Complications: No apparent anesthesia complications

## 2014-05-02 NOTE — Anesthesia Postprocedure Evaluation (Signed)
Anesthesia Post Note  Patient: [name] [name]  Procedure(s) Performed: Procedure(s) (LRB): SACROILIAC JOINT FUSION (Right)  Anesthesia type: General  Patient location: PACU  Post pain: Pain level controlled and Adequate analgesia  Post assessment: Post-op Vital signs reviewed, Patient's Cardiovascular Status Stable, Respiratory Function Stable, Patent Airway and Pain level controlled  Last Vitals:  Filed Vitals:   05/02/14 1800  BP: 132/67  Pulse: 85  Temp:   Resp: 14    Post vital signs: Reviewed and stable  Level of consciousness: awake, alert  and oriented  Complications: No apparent anesthesia complications

## 2014-05-02 NOTE — H&P (Signed)
PREOPERATIVE H&P  Chief Complaint: Right low back pain  HPI: [name] [name] is a 53 y.o. male who presents with ongoing pain at the R low back. Patient is s/p a work injury that did occur 1 yaer ago. Patient did report substantial resolution of his pain after a right SI injection. Patien did elect to proceed with the surgery noted below.   Past Medical History  Diagnosis Date  . Diabetes mellitus   . Hypertension   . Sleep apnea     BiPAP  . Environmental and seasonal allergies   . Depression   . Headache(784.0)     sinus and migraines  . Environmental allergies    Past Surgical History  Procedure Laterality Date  . Knee arthroscopy Left   . Tonsillectomy      uvula removed   History   Social History  . Marital Status: Single    Spouse Name: N/A    Number of Children: N/A  . Years of Education: N/A   Social History Main Topics  . Smoking status: Former Smoker -- 1.00 packs/day for 10 years    Quit date: 04/20/1992  . Smokeless tobacco: Never Used  . Alcohol Use: No  . Drug Use: No  . Sexual Activity: Not on file   Other Topics Concern  . Not on file   Social History Narrative  . No narrative on file   No family history on file. Allergies  Allergen Reactions  . Pravastatin Other (See Comments)    Joints being to hurt   Prior to Admission medications   Medication Sig Start Date End Date Taking? Authorizing Provider  buPROPion (WELLBUTRIN SR) 200 MG 12 hr tablet Take 200 mg by mouth 2 (two) times daily.     Yes Historical Provider, MD  cetirizine (ZYRTEC) 10 MG tablet Take 10 mg by mouth daily.   Yes Historical Provider, MD  fluticasone (FLONASE) 50 MCG/ACT nasal spray Place 1 spray into both nostrils daily.   Yes Historical Provider, MD  lisinopril-hydrochlorothiazide (PRINZIDE,ZESTORETIC) 20-25 MG per tablet Take 1 tablet by mouth daily.     Yes Historical Provider, MD  metFORMIN (GLUCOPHAGE) 1000 MG tablet Take 1,000 mg by mouth 2 (two) times daily with a  meal.   Yes Historical Provider, MD  simvastatin (ZOCOR) 40 MG tablet Take 40 mg by mouth every evening.   Yes Historical Provider, MD  Tamsulosin HCl (FLOMAX) 0.4 MG CAPS Take 0.4 mg by mouth daily after supper.     Yes Historical Provider, MD  verapamil (CALAN) 120 MG tablet Take 120 mg by mouth 2 (two) times daily.   Yes Historical Provider, MD  amoxicillin-clavulanate (AUGMENTIN) 875-125 MG per tablet Take 1 tablet by mouth every 12 (twelve) hours. 04/22/14   Rodolph BongEvan S Corey, MD  predniSONE (DELTASONE) 10 MG tablet Take 3 tablets (30 mg total) by mouth daily. 04/22/14   Rodolph BongEvan S Corey, MD  traMADol (ULTRAM) 50 MG tablet Take 1 tablet (50 mg total) by mouth every 6 (six) hours as needed. 04/22/14   Rodolph BongEvan S Corey, MD     All other systems have been reviewed and were otherwise negative with the exception of those mentioned in the HPI and as above.  Physical Exam: There were no vitals filed for this visit.  General: Alert, no acute distress Cardiovascular: No pedal edema Respiratory: No cyanosis, no use of accessory musculature GI: No organomegaly, abdomen is soft and non-tender Skin: No lesions in the area of chief complaint Neurologic: Sensation intact  distally Psychiatric: Patient is competent for consent with normal mood and affect Lymphatic: No axillary or cervical lymphadenopathy  MUSCULOSKELETAL: + TTP right low back  Assessment/Plan: Right sacroiliac joint dysfunction Plan for Procedure(s): RIGHT SACROILIAC JOINT FUSION   Emilee HeroMark Leonard Adelene Polivka, MD 05/02/2014 6:50 AM

## 2014-05-03 ENCOUNTER — Encounter (HOSPITAL_COMMUNITY): Payer: Self-pay | Admitting: Orthopedic Surgery

## 2014-05-03 LAB — GLUCOSE, CAPILLARY: Glucose-Capillary: 213 mg/dL — ABNORMAL HIGH (ref 70–99)

## 2014-05-03 MED ORDER — TRAMADOL HCL 50 MG PO TABS: 50.0000 mg | ORAL_TABLET | Freq: Four times a day (QID) | ORAL | Status: DC | PRN

## 2014-05-03 NOTE — Op Note (Signed)
NAMChipper Herb:  [name], [name]              ACCOUNT NO.:  [id number]  MEDICAL RECORD NO.:  [id number]  LOCATION:  3C02C                        FACILITY:  [facility]  PHYSICIAN:  Estill BambergMark Haylyn Halberg, MD      DATE OF BIRTH:  [birth year]  DATE OF PROCEDURE:  05/02/2014                              OPERATIVE REPORT   PREOPERATIVE DIAGNOSIS:  Right sacroiliac joint dysfunction.  POSTOPERATIVE DIAGNOSIS:  Right sacroiliac joint dysfunction.  PROCEDURE:  Right sacroiliac joint fusion using the iFuse Sacroiliac Joint Fusion System.  SURGEON:  Estill BambergMark Britton Bera, MD  ASSISTANT:  Jason CoopKayla McKenzie, [facility]  ANESTHESIA:  General endotracheal anesthesia.  COMPLICATIONS:  None.  DISPOSITION:  Stable.  ESTIMATED BLOOD LOSS:  Minimal.  INDICATIONS FOR PROCEDURE:  Briefly, [name] is a pleasant 53 year old male, who did present to me on July 14, 2013, with severe pain at the right side of his low back and in addition to his right leg.  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  He fell directly on his back, and has been having ongoing pain at the right side of his low back ever since his fall.  We did go forward with multiple forms of conservative care.  Of particular note, the patient did have a diagnostic right sacroiliac joint injection, and the patient did report that he did get substantial temporary relief after the injection.  After failing multiple forms of conservative care, we did decide to proceed with the surgery reflected above.  The patient did fully understand the risks and limitations of the procedure as outlined in my preoperative note.  OPERATIVE DETAILS:  On May 02, 2014, the patient was brought to Surgery and general endotracheal anesthesia was administered.  The patient was placed prone on a well-padded flat Jackson bed.  Gel rolls were placed onto the patient's chest and hips.  Antibiotics were given and a time- out  procedure was performed.  The right buttock was prepped and draped in the usual sterile fashion.  I then made a 3-cm incision in line with the posterior border of the sacrum on the right side.  I then advanced 3 guidewires across the sacroiliac joint.  One just above the S1 foramen, 1 in line with it, and 1 just beneath it in line with the S2 foramen.  I then drilled and broached over the guidewires.  I then advanced implants 7 mm in diameter of the appropriate length over each of the guidewires. I was very pleased with the appearance of the implants on fluoroscopy. I then removed the guidewires.  The wound was then copiously irrigated with normal saline.  The fascia was then closed using 0 Vicryl.  The subcutaneous layer was closed using 2-0 Vicryl.  The skin was closed using 3-0 Monocryl.  Benzoin and Steri- Strips were applied followed by sterile dressing.  All instrument counts were correct at the termination of the procedure.  Of note, Jason CoopKayla McKenzie was my assistant throughout surgery and did aid in retraction, suctioning, and closure.     Estill BambergMark Syria Kestner, MD     MD/MEDQ  D:  05/02/2014  T:  05/03/2014  Job:  [id number]

## 2014-05-03 NOTE — Progress Notes (Signed)
Patient looks well. Minimal pain.   BP 122/72  Pulse 87  Temp(Src) 97.9 F (36.6 C) (Oral)  Resp 20  Wt 100.245 kg (221 lb)  SpO2 90%  NVI Dressing in place  S/p right SI fusion doing well  - d/c home today - f/u 1.5 weeks - TDWB on right

## 2014-05-03 NOTE — Progress Notes (Signed)
Pt. discharged home accompanied by friend. Prescriptions and discharge instructions given with verbalization of understanding. Incision site on Rt. hip with no s/s of infection - no swelling, redness, bleeding, and/or drainage noted.  Patient has follow up appointment with MD

## 2014-05-24 NOTE — Discharge Summary (Signed)
Patient ID: [name] [name] MRN: [id number] DOB/AGE: Jul 01, 1961 53 y.o.  Admit date: 05/02/2014 Discharge date: 05/03/2014  Admission Diagnoses:  Active Problems:   Sacroiliac joint dysfunction RIGHT   Discharge Diagnoses:  Same  Past Medical History  Diagnosis Date  . Diabetes mellitus   . Hypertension   . Sleep apnea     BiPAP  . Environmental and seasonal allergies   . Depression   . Headache(784.0)     sinus and migraines  . Environmental allergies     Surgeries: Procedure(s): R SACROILIAC JOINT FUSION on 05/02/2014   Consultants:  None  Discharged Condition: Improved  Hospital Course: Andreas Souva is an 53 y.o. male who was admitted 05/02/2014 for operative treatment of R SIJ Dysfunction. Patient has severe unremitting pain that affects sleep, daily activities, and work/hobbies. After pre-op clearance the patient was taken to the operating room on 05/02/2014 and underwent  Procedure(s): R SACROILIAC JOINT FUSION.    Patient was given perioperative antibiotics:  Anti-infectives   Start     Dose/Rate Route Frequency Ordered Stop   05/02/14 2100  ceFAZolin (ANCEF) IVPB 1 g/50 mL premix  Status:  Discontinued     1 g 100 mL/hr over 30 Minutes Intravenous Every 8 hours 05/02/14 1933 05/03/14 1444   05/02/14 2000  amoxicillin-clavulanate (AUGMENTIN) 875-125 MG per tablet 1 tablet  Status:  Discontinued     1 tablet Oral Every 12 hours 05/02/14 1933 05/03/14 1444   05/02/14 0600  ceFAZolin (ANCEF) IVPB 2 g/50 mL premix  Status:  Discontinued     2 g 100 mL/hr over 30 Minutes Intravenous On call to O.R. 05/01/14 1413 05/02/14 1932       Patient was given sequential compression devices, early ambulation, to prevent DVT.  Patient benefited maximally from hospital stay and there were no complications.    Recent vital signs: BP 106/83  Pulse 74  Temp(Src) 98.4 F (36.9 C) (Oral)  Resp 18  Wt 100.245 kg (221 lb)  SpO2 95%    Discharge Medications:     Medication  List    STOP taking these medications       predniSONE 10 MG tablet  Commonly known as:  DELTASONE      TAKE these medications       amoxicillin-clavulanate 875-125 MG per tablet  Commonly known as:  AUGMENTIN  Take 1 tablet by mouth every 12 (twelve) hours.     buPROPion 200 MG 12 hr tablet  Commonly known as:  WELLBUTRIN SR  Take 200 mg by mouth 2 (two) times daily.     cetirizine 10 MG tablet  Commonly known as:  ZYRTEC  Take 10 mg by mouth daily.     fluticasone 50 MCG/ACT nasal spray  Commonly known as:  FLONASE  Place 1 spray into both nostrils daily.     lisinopril-hydrochlorothiazide 20-25 MG per tablet  Commonly known as:  PRINZIDE,ZESTORETIC  Take 1 tablet by mouth daily.     metFORMIN 1000 MG tablet  Commonly known as:  GLUCOPHAGE  Take 1,000 mg by mouth 2 (two) times daily with a meal.     simvastatin 40 MG tablet  Commonly known as:  ZOCOR  Take 40 mg by mouth every evening.     tamsulosin 0.4 MG Caps capsule  Commonly known as:  FLOMAX  Take 0.4 mg by mouth daily after supper.     traMADol 50 MG tablet  Commonly known as:  ULTRAM  Take 1 tablet (50 mg  total) by mouth every 6 (six) hours as needed.     verapamil 120 MG tablet  Commonly known as:  CALAN  Take 120 mg by mouth 2 (two) times daily.        Diagnostic Studies: Dg Si Joints  05/02/2014   CLINICAL DATA:  RIGHT SI JOINT FUSION  EXAM: BILATERAL SACROILIAC JOINTS - 3+ VIEW; DG C-ARM 1-60 MIN  FLUOROSCOPY TIME:  1 min 47 seconds  COMPARISON:  None  FINDINGS: Three intraoperative fluoroscopic spot images demonstrate right sacroiliac joint fusion. There is no failure or complication.  IMPRESSION: Right SI joint fusion.   Electronically Signed   By: Elige KoHetal  Patel   On: 05/02/2014 18:13   Dg C-arm 1-60 Min  05/02/2014   CLINICAL DATA:  RIGHT SI JOINT FUSION  EXAM: BILATERAL SACROILIAC JOINTS - 3+ VIEW; DG C-ARM 1-60 MIN  FLUOROSCOPY TIME:  1 min 47 seconds  COMPARISON:  None  FINDINGS: Three  intraoperative fluoroscopic spot images demonstrate right sacroiliac joint fusion. There is no failure or complication.  IMPRESSION: Right SI joint fusion.   Electronically Signed   By: Elige KoHetal  Patel   On: 05/02/2014 18:13    Disposition: 01-Home or Self Care      Discharge Instructions   Discharge patient    Complete by:  As directed            Follow-up Information   Follow up with Emilee HeroUMONSKI,MARK LEONARD, MD On 05/11/2014. (follow up appointment)    Specialty:  Orthopedic Surgery   Contact information:   [address] [place] [id number] [phone]      S/p right SI fusion doing well  - d/c home today  - f/u 1.5 weeks  - TDWB on right  Signed: Georga BoraKayla J Nico Rogness 05/24/2014, 12:47 PM

## 2014-05-26 ENCOUNTER — Encounter: Payer: Self-pay | Admitting: Internal Medicine

## 2014-06-22 NOTE — OR Nursing (Signed)
Addendum to scope page 

## 2014-09-27 ENCOUNTER — Encounter: Payer: Self-pay | Admitting: Internal Medicine

## 2015-02-20 ENCOUNTER — Other Ambulatory Visit (HOSPITAL_COMMUNITY): Payer: Self-pay | Admitting: Orthopedic Surgery

## 2015-02-20 DIAGNOSIS — M25512 Pain in left shoulder: Secondary | ICD-10-CM | POA: Insufficient documentation

## 2015-03-11 ENCOUNTER — Ambulatory Visit (HOSPITAL_COMMUNITY)
Admission: RE | Admit: 2015-03-11 | Discharge: 2015-03-11 | Disposition: A | Discharge status: Home / Self Care | Payer: Self-pay | Source: Ambulatory Visit | Attending: Orthopedic Surgery | Admitting: Orthopedic Surgery

## 2015-03-11 DIAGNOSIS — G8929 Other chronic pain: Secondary | ICD-10-CM | POA: Insufficient documentation

## 2015-03-11 DIAGNOSIS — M25512 Pain in left shoulder: Secondary | ICD-10-CM | POA: Insufficient documentation

## 2015-03-22 DIAGNOSIS — M7542 Impingement syndrome of left shoulder: Secondary | ICD-10-CM | POA: Insufficient documentation

## 2015-03-26 ENCOUNTER — Emergency Department (HOSPITAL_COMMUNITY)
Admission: EM | Admit: 2015-03-26 | Discharge: 2015-03-26 | Disposition: A | Discharge status: Home / Self Care | Payer: Self-pay | Attending: Emergency Medicine | Admitting: Emergency Medicine

## 2015-03-26 ENCOUNTER — Encounter (HOSPITAL_COMMUNITY): Payer: Self-pay | Admitting: Emergency Medicine

## 2015-03-26 ENCOUNTER — Emergency Department (HOSPITAL_COMMUNITY): Payer: Self-pay

## 2015-03-26 DIAGNOSIS — R200 Anesthesia of skin: Secondary | ICD-10-CM | POA: Insufficient documentation

## 2015-03-26 DIAGNOSIS — Z9981 Dependence on supplemental oxygen: Secondary | ICD-10-CM | POA: Insufficient documentation

## 2015-03-26 DIAGNOSIS — Z7951 Long term (current) use of inhaled steroids: Secondary | ICD-10-CM | POA: Insufficient documentation

## 2015-03-26 DIAGNOSIS — R51 Headache: Secondary | ICD-10-CM

## 2015-03-26 DIAGNOSIS — F329 Major depressive disorder, single episode, unspecified: Secondary | ICD-10-CM | POA: Insufficient documentation

## 2015-03-26 DIAGNOSIS — Z87891 Personal history of nicotine dependence: Secondary | ICD-10-CM | POA: Insufficient documentation

## 2015-03-26 DIAGNOSIS — R519 Headache, unspecified: Secondary | ICD-10-CM

## 2015-03-26 DIAGNOSIS — Z79899 Other long term (current) drug therapy: Secondary | ICD-10-CM | POA: Insufficient documentation

## 2015-03-26 DIAGNOSIS — R0982 Postnasal drip: Secondary | ICD-10-CM | POA: Insufficient documentation

## 2015-03-26 DIAGNOSIS — E119 Type 2 diabetes mellitus without complications: Secondary | ICD-10-CM | POA: Insufficient documentation

## 2015-03-26 DIAGNOSIS — G4730 Sleep apnea, unspecified: Secondary | ICD-10-CM | POA: Insufficient documentation

## 2015-03-26 DIAGNOSIS — I10 Essential (primary) hypertension: Secondary | ICD-10-CM | POA: Insufficient documentation

## 2015-03-26 DIAGNOSIS — F410 Panic disorder [episodic paroxysmal anxiety] without agoraphobia: Secondary | ICD-10-CM | POA: Insufficient documentation

## 2015-03-26 DIAGNOSIS — R0602 Shortness of breath: Secondary | ICD-10-CM | POA: Insufficient documentation

## 2015-03-26 DIAGNOSIS — G43909 Migraine, unspecified, not intractable, without status migrainosus: Secondary | ICD-10-CM | POA: Insufficient documentation

## 2015-03-26 DIAGNOSIS — R1013 Epigastric pain: Secondary | ICD-10-CM | POA: Insufficient documentation

## 2015-03-26 LAB — I-STAT TROPONIN, ED
Troponin i, poc: 0 ng/mL (ref 0.00–0.08)
Troponin i, poc: 0 ng/mL (ref 0.00–0.08)

## 2015-03-26 LAB — BASIC METABOLIC PANEL
Anion gap: 10 (ref 5–15)
BUN: 27 mg/dL — ABNORMAL HIGH (ref 6–23)
CO2: 28 mmol/L (ref 19–32)
Calcium: 10.7 mg/dL — ABNORMAL HIGH (ref 8.4–10.5)
Chloride: 105 mmol/L (ref 96–112)
Creatinine, Ser: 1.45 mg/dL — ABNORMAL HIGH (ref 0.50–1.35)
GFR calc Af Amer: 63 mL/min — ABNORMAL LOW (ref >90)
GFR calc non Af Amer: 54 mL/min — ABNORMAL LOW (ref >90)
Glucose, Bld: 151 mg/dL — ABNORMAL HIGH (ref 70–99)
Potassium: 4.3 mmol/L (ref 3.5–5.1)
Sodium: 143 mmol/L (ref 135–145)

## 2015-03-26 LAB — CBC WITH DIFFERENTIAL/PLATELET
Basophils Absolute: 0 10*3/uL (ref 0.0–0.1)
Basophils Relative: 0 % (ref 0–1)
Eosinophils Absolute: 0.1 10*3/uL (ref 0.0–0.7)
Eosinophils Relative: 1 % (ref 0–5)
HCT: 50.3 % (ref 39.0–52.0)
Hemoglobin: 16.9 g/dL (ref 13.0–17.0)
Lymphocytes Relative: 12 % (ref 12–46)
Lymphs Abs: 1.1 10*3/uL (ref 0.7–4.0)
MCH: 29.3 pg (ref 26.0–34.0)
MCHC: 33.6 g/dL (ref 30.0–36.0)
MCV: 87.3 fL (ref 78.0–100.0)
Monocytes Absolute: 1 10*3/uL (ref 0.1–1.0)
Monocytes Relative: 11 % (ref 3–12)
Neutro Abs: 7.2 10*3/uL (ref 1.7–7.7)
Neutrophils Relative %: 76 % (ref 43–77)
Platelets: 270 10*3/uL (ref 150–400)
RBC: 5.76 MIL/uL (ref 4.22–5.81)
RDW: 14 % (ref 11.5–15.5)
WBC: 9.5 10*3/uL (ref 4.0–10.5)

## 2015-03-26 LAB — BRAIN NATRIURETIC PEPTIDE: B Natriuretic Peptide: 21.5 pg/mL (ref 0.0–100.0)

## 2015-03-26 MED ORDER — ONDANSETRON 4 MG PO TBDP
8.0000 mg | ORAL_TABLET | Freq: Once | ORAL | Status: AC
  Administered 2015-03-26: 8 mg via ORAL
  Filled 2015-03-26: qty 2

## 2015-03-26 MED ORDER — HYDROCODONE-ACETAMINOPHEN 5-325 MG PO TABS
1.0000 | ORAL_TABLET | Freq: Once | ORAL | Status: AC
  Administered 2015-03-26: 1 via ORAL
  Filled 2015-03-26: qty 1

## 2015-03-26 MED ORDER — ONDANSETRON HCL 8 MG PO TABS: 8.0000 mg | ORAL_TABLET | Freq: Three times a day (TID) | ORAL | Status: DC | PRN

## 2015-03-26 MED ORDER — HYDROCODONE-ACETAMINOPHEN 5-325 MG PO TABS: 1.0000 | ORAL_TABLET | Freq: Four times a day (QID) | ORAL | Status: DC | PRN

## 2015-03-26 MED ORDER — HYDROCODONE-ACETAMINOPHEN 5-325 MG PO TABS
2.0000 | ORAL_TABLET | Freq: Once | ORAL | Status: AC
  Administered 2015-03-26: 2 via ORAL
  Filled 2015-03-26: qty 2

## 2015-03-26 NOTE — Discharge Instructions (Signed)
Your blood creatinine (kidney function) today is 1.45, slightly abnormal. Blood sugar is 151`. Call your primary care physician tomorrow and have him/her compare today's blood creatinine level to the most recent one that you've had. You may need to have your blood creatinine rechecked within the next few weeks. Take Tylenol for mild pain or the pain medicine prescribed for bad pain. You've also been prescribed medicine for nausea.

## 2015-03-26 NOTE — ED Notes (Signed)
Pt sts [facility] started this am with nausea and diarrhea; pt sts some SOB on way to hospital and tingling in face; pt hyperventilating upon arrival

## 2015-03-26 NOTE — ED Notes (Signed)
The pt reports that his cough is dry non-productive

## 2015-03-26 NOTE — ED Notes (Signed)
Pt verbalized understanding of d/c instructions and has no further questions. Pt going home with friend driving.

## 2015-03-26 NOTE — ED Provider Notes (Signed)
CSN: [id number]     Arrival date & time 03/26/15  1437 History   First MD Initiated Contact with Patient 03/26/15 1705     Chief Complaint  Patient presents with  . Headache  . Shortness of Breath  . Nausea     (Consider location/radiation/quality/duration/timing/severity/associated sxs/prior Treatment) Patient is a 54 y.o. male presenting with headaches and shortness of breath.  Headache Associated symptoms: abdominal pain, drainage, nausea, numbness and vomiting   Shortness of Breath Associated symptoms: abdominal pain, headaches and vomiting    Patient developed gradual onset headache bitemporal at the top of his head onset 11 AM today gradually. He gets headaches daily however not similar to this. He also developed nausea, vomited one time, and shortness of breath. shortness of breath he denies any chest pain. He did have tingling around his mouth when he was breathing hard. Tingling around his mouth has since resolved. No other associated symptoms. No treatment prior to coming here. Presently reports that dyspnea is mild nausea is mild headache is moderate to severe. Patient gets sinus headaches daily for several years and has postnasal drip which occasionally causes nausea. Nothing makes symptoms better or worse. Other associated symptoms include mild epigastric pain, nonradiating. No chest pain patient vomited once after onset of symptoms. Past Medical History  Diagnosis Date  . Diabetes mellitus   . Hypertension   . Sleep apnea     BiPAP  . Environmental and seasonal allergies   . Depression   . Headache(784.0)     sinus and migraines  . Environmental allergies    Past Surgical History  Procedure Laterality Date  . Knee arthroscopy Left   . Tonsillectomy      uvula removed  . Sacroiliac joint fusion Right 05/02/2014    Procedure: SACROILIAC JOINT FUSION;  Surgeon: Emilee HeroMark Leonard Dumonski, MD;  [facility];  Service: Orthopedics;  Laterality: Right;  Right sided sacroiliac  joint fusion   History reviewed. No pertinent family history. History  Substance Use Topics  . Smoking status: Former Smoker -- 1.00 packs/day for 10 years    Quit date: 04/20/1992  . Smokeless tobacco: Never Used  . Alcohol Use: No    Review of Systems  Constitutional: Negative.   HENT: Positive for postnasal drip.   Respiratory: Positive for shortness of breath.   Cardiovascular: Negative.   Gastrointestinal: Positive for nausea, vomiting and abdominal pain.  Musculoskeletal: Negative.   Skin: Negative.   Neurological: Positive for numbness and headaches.       Tingling around her lips  Psychiatric/Behavioral: Negative.   All other systems reviewed and are negative.     Allergies  Pravastatin  Home Medications   Prior to Admission medications   Medication Sig Start Date End Date Taking? Authorizing Provider  buPROPion (WELLBUTRIN SR) 200 MG 12 hr tablet Take 200 mg by mouth 2 (two) times daily.     Yes Historical Provider, MD  cetirizine (ZYRTEC) 10 MG tablet Take 10 mg by mouth daily.   Yes Historical Provider, MD  fluticasone (FLONASE) 50 MCG/ACT nasal spray Place 1 spray into both nostrils daily.   Yes Historical Provider, MD  glipiZIDE (GLUCOTROL) 5 MG tablet Take 5 mg by mouth daily before breakfast.   Yes Historical Provider, MD  hydrochlorothiazide (HYDRODIURIL) 25 MG tablet Take 25 mg by mouth daily.   Yes Historical Provider, MD  lisinopril (PRINIVIL,ZESTRIL) 40 MG tablet Take 40 mg by mouth daily.   Yes Historical Provider, MD  simvastatin (ZOCOR) 40  MG tablet Take 40 mg by mouth every evening.   Yes Historical Provider, MD  sitaGLIPtin-metformin (JANUMET) 50-1000 MG per tablet Take 1 tablet by mouth 2 (two) times daily with a meal.   Yes Historical Provider, MD  Tamsulosin HCl (FLOMAX) 0.4 MG CAPS Take 0.4 mg by mouth daily after supper.     Yes Historical Provider, MD  verapamil (CALAN) 120 MG tablet Take 120 mg by mouth 3 (three) times daily.    Yes  Historical Provider, MD  amoxicillin-clavulanate (AUGMENTIN) 875-125 MG per tablet Take 1 tablet by mouth every 12 (twelve) hours. 04/22/14   Rodolph Bong, MD  traMADol (ULTRAM) 50 MG tablet Take 1 tablet (50 mg total) by mouth every 6 (six) hours as needed. 05/03/14   Estill Bamberg, MD   BP 166/97 mmHg  Pulse 98  Temp(Src) 97.7 F (36.5 C) (Oral)  Resp 18  SpO2 100% Physical Exam  Constitutional: He is oriented to person, place, and time. He appears well-developed and well-nourished. No distress.  HENT:  Head: Normocephalic and atraumatic.  Eyes: Conjunctivae are normal. Pupils are equal, round, and reactive to light.  Neck: Neck supple. No tracheal deviation present. No thyromegaly present.  Cardiovascular: Normal rate and regular rhythm.   No murmur heard. Pulmonary/Chest: Effort normal and breath sounds normal.  Abdominal: Soft. Bowel sounds are normal. He exhibits no distension. There is no tenderness.  Musculoskeletal: Normal range of motion. He exhibits no edema or tenderness.  Neurological: He is alert and oriented to person, place, and time. No cranial nerve deficit. Coordination normal.  Gait normal Romberg normal pronator drift normal finger to nose normal cranial nerves II through XII grossly intact. DTRs symmetric bilaterally at knee jerk ankle jerk biceps toes downward going bilaterally  Skin: Skin is warm and dry. No rash noted.  Psychiatric: He has a normal mood and affect.  Nursing note and vitals reviewed.   ED Course  Procedures (including critical care time) Labs Review Labs Reviewed  BASIC METABOLIC PANEL - Abnormal; Notable for the following:    Glucose, Bld 151 (*)    BUN 27 (*)    Creatinine, Ser 1.45 (*)    Calcium 10.7 (*)    GFR calc non Af Amer 54 (*)    GFR calc Af Amer 63 (*)    All other components within normal limits  CBC WITH DIFFERENTIAL/PLATELET  BRAIN NATRIURETIC PEPTIDE  I-STAT TROPOININ, ED    Imaging Review Dg Chest 2  View  03/26/2015   CLINICAL DATA:  Shortness of breath.  Nausea and headache.  EXAM: CHEST  2 VIEW  COMPARISON:  04/20/2014 and 09/05/2007  FINDINGS: The heart size and mediastinal contours are within normal limits. Both lungs are clear. The visualized skeletal structures are unremarkable.  IMPRESSION: Normal exam.   Electronically Signed   By: Francene Boyers M.D.   On: 03/26/2015 15:33     EKG Interpretation   Date/Time:  Tuesday March 26 2015 14:47:05 EDT Ventricular Rate:  95 PR Interval:  148 QRS Duration: 66 QT Interval:  336 QTC Calculation: 422 R Axis:   73 Text Interpretation:  Normal sinus rhythm Anteroseptal infarct , age  undetermined Abnormal ECG No significant change since last tracing  Confirmed by Ethelda Chick  MD, Florie Carico [phone]) on 03/26/2015 5:19:26 PM     7:15 PM dyspnea has resolved. He reports that headache and nausea improved after treatment with Norco and oral Zofran. He requests additional pain medicine. Additional Norco ordered. Results for orders  placed or performed during the hospital encounter of 03/26/15  Basic metabolic panel  Result Value Ref Range   Sodium 143 135 - 145 mmol/L   Potassium 4.3 3.5 - 5.1 mmol/L   Chloride 105 96 - 112 mmol/L   CO2 28 19 - 32 mmol/L   Glucose, Bld 151 (H) 70 - 99 mg/dL   BUN 27 (H) 6 - 23 mg/dL   Creatinine, Ser 6.96 (H) 0.50 - 1.35 mg/dL   Calcium 29.5 (H) 8.4 - 10.5 mg/dL   GFR calc non Af Amer 54 (L) >90 mL/min   GFR calc Af Amer 63 (L) >90 mL/min   Anion gap 10 5 - 15  CBC with Differential  Result Value Ref Range   WBC 9.5 4.0 - 10.5 K/uL   RBC 5.76 4.22 - 5.81 MIL/uL   Hemoglobin 16.9 13.0 - 17.0 g/dL   HCT 28.4 13.2 - 44.0 %   MCV 87.3 78.0 - 100.0 fL   MCH 29.3 26.0 - 34.0 pg   MCHC 33.6 30.0 - 36.0 g/dL   RDW 10.2 72.5 - 36.6 %   Platelets 270 150 - 400 K/uL   Neutrophils Relative % 76 43 - 77 %   Neutro Abs 7.2 1.7 - 7.7 K/uL   Lymphocytes Relative 12 12 - 46 %   Lymphs Abs 1.1 0.7 - 4.0 K/uL    Monocytes Relative 11 3 - 12 %   Monocytes Absolute 1.0 0.1 - 1.0 K/uL   Eosinophils Relative 1 0 - 5 %   Eosinophils Absolute 0.1 0.0 - 0.7 K/uL   Basophils Relative 0 0 - 1 %   Basophils Absolute 0.0 0.0 - 0.1 K/uL  BNP (order ONLY if patient complains of dyspnea/SOB AND you have documented it for THIS visit)  Result Value Ref Range   B Natriuretic Peptide 21.5 0.0 - 100.0 pg/mL  I-stat troponin, ED (not at [facility])  Result Value Ref Range   Troponin i, poc 0.00 0.00 - 0.08 ng/mL   Comment 3          I-stat troponin, ED  Result Value Ref Range   Troponin i, poc 0.00 0.00 - 0.08 ng/mL   Comment 3           Dg Chest 2 View  03/26/2015   CLINICAL DATA:  Shortness of breath.  Nausea and headache.  EXAM: CHEST  2 VIEW  COMPARISON:  04/20/2014 and 09/05/2007  FINDINGS: The heart size and mediastinal contours are within normal limits. Both lungs are clear. The visualized skeletal structures are unremarkable.  IMPRESSION: Normal exam.   Electronically Signed   By: Francene Boyers M.D.   On: 03/26/2015 15:33   Mr Shoulder Left Wo Contrast  03/12/2015   CLINICAL DATA:  Chronic left shoulder pain.  No known injury.  EXAM: MRI OF THE LEFT SHOULDER WITHOUT CONTRAST  TECHNIQUE: Multiplanar, multisequence MR imaging of the shoulder was performed. No intravenous contrast was administered.  COMPARISON:  None.  FINDINGS: Rotator cuff: Mild appearing supraspinatus and infraspinatus tendinopathy without tear is identified. The subscapularis is unremarkable.  Muscles:  Normal in appearance without atrophy or focal lesion.  Biceps long head:  Intact.  Acromioclavicular Joint:  Mild degenerative change is seen.  Glenohumeral Joint: Unremarkable.  Labrum: No tear is identified. There is some bony hypertrophy along the posterior glenoid subjacent to the labrum likely due to chronic stress change and may indicate shoulder instability.  Bones: No fracture or worrisome marrow lesion is identified.  The acromion is type 3.   IMPRESSION: Mild supraspinatus and infraspinatus tendinopathy without tear.  Mild acromioclavicular osteoarthritis. Type 3 acromion is also identified.  Bony spurring along the posterior, inferior glenoid subjacent to the labrum is compatible with chronic stress change.   Electronically Signed   By: Drusilla Kanner M.D.   On: 03/12/2015 09:19    MDM  Cardiac risk factors hypercholesterolemia diabetes hypertension family history. Final diagnoses:  None   I had a lengthy discussion with patient. He has a history of panic attacks and I feel that he likely had panic attack today. Heart score equals 3 based on risk factors and age. Plan prescription Norco, Zofran. Referral back to primary care physician Diagnosis #1 headache #2 dyspnea #3 nausea and vomiting #4 renal insufficiency #5 hyperglycemia    Doug Sou, MD 03/26/15 1610

## 2015-03-26 NOTE — ED Notes (Signed)
Pt c/o hip rt headache since may 2014.

## 2015-03-26 NOTE — ED Notes (Signed)
Headache is better nomchest pain hasnit had any since he arrived.  Hip pain is sl better

## 2015-05-26 ENCOUNTER — Emergency Department (INDEPENDENT_AMBULATORY_CARE_PROVIDER_SITE_OTHER): Payer: Worker's Compensation

## 2015-05-26 ENCOUNTER — Emergency Department (INDEPENDENT_AMBULATORY_CARE_PROVIDER_SITE_OTHER)
Admission: EM | Admit: 2015-05-26 | Discharge: 2015-05-26 | Disposition: A | Discharge status: Home / Self Care | Payer: Self-pay | Source: Home / Self Care | Attending: Internal Medicine | Admitting: Internal Medicine

## 2015-05-26 ENCOUNTER — Encounter (HOSPITAL_COMMUNITY): Payer: Self-pay | Admitting: *Deleted

## 2015-05-26 DIAGNOSIS — M064 Inflammatory polyarthropathy: Secondary | ICD-10-CM

## 2015-05-26 DIAGNOSIS — E119 Type 2 diabetes mellitus without complications: Secondary | ICD-10-CM

## 2015-05-26 DIAGNOSIS — T07XXXA Unspecified multiple injuries, initial encounter: Secondary | ICD-10-CM

## 2015-05-26 DIAGNOSIS — T148 Other injury of unspecified body region: Secondary | ICD-10-CM | POA: Diagnosis not present

## 2015-05-26 DIAGNOSIS — M1990 Unspecified osteoarthritis, unspecified site: Secondary | ICD-10-CM

## 2015-05-26 DIAGNOSIS — S93402A Sprain of unspecified ligament of left ankle, initial encounter: Secondary | ICD-10-CM

## 2015-05-26 LAB — GLUCOSE, CAPILLARY: Glucose-Capillary: 99 mg/dL (ref 65–99)

## 2015-05-26 MED ORDER — SULFAMETHOXAZOLE-TRIMETHOPRIM 800-160 MG PO TABS: 1.0000 | ORAL_TABLET | Freq: Two times a day (BID) | ORAL | Status: AC

## 2015-05-26 MED ORDER — NAPROXEN 500 MG PO TABS: 500.0000 mg | ORAL_TABLET | Freq: Two times a day (BID) | ORAL | Status: DC

## 2015-05-26 NOTE — ED Provider Notes (Signed)
CSN: [id number]     Arrival date & time 05/26/15  1654 History   First MD Initiated Contact with Patient 05/26/15 1708     Chief Complaint  Patient presents with  . Fall   HPI   Patient is dog sitting, tripped and fell yesterday morning while trying to catch the dog. Had swelling of the left lateral ankle almost immediately.  Abrasions of both feet, and right knee. Able to walk independently into urgent care today.  This morning, observed worsening swelling of the left ankle and foot, with erythema. He is worried about infection, because he is diabetic and has had cellulitis before. He has also had gout before.  He became lightheaded while washing up this morning, and actually went to the floor, without injury. He did not hit his head, and has no neck or back pain.  Past Medical History  Diagnosis Date  . Diabetes mellitus   . Hypertension   . Sleep apnea     BiPAP  . Environmental and seasonal allergies   . Depression   . Headache(784.0)     sinus and migraines  . Environmental allergies    Past Surgical History  Procedure Laterality Date  . Knee arthroscopy Left   . Tonsillectomy      uvula removed  . Sacroiliac joint fusion Right 05/02/2014    Procedure: SACROILIAC JOINT FUSION;  Surgeon: Emilee Hero, MD;  Location: [facility] OR;  Service: Orthopedics;  Laterality: Right;  Right sided sacroiliac joint fusion   History reviewed. No pertinent family history. History  Substance Use Topics  . Smoking status: Former Smoker -- 1.00 packs/day for 10 years    Quit date: 04/20/1992  . Smokeless tobacco: Never Used  . Alcohol Use: No    Review of Systems  All other systems reviewed and are negative.   Allergies  Pravastatin  Home Medications   Prior to Admission medications   Medication Sig Start Date End Date Taking? Authorizing Provider  amoxicillin-clavulanate (AUGMENTIN) 875-125 MG per tablet Take 1 tablet by mouth every 12 (twelve) hours. 04/22/14   Rodolph Bong, MD   buPROPion (WELLBUTRIN SR) 200 MG 12 hr tablet Take 200 mg by mouth 2 (two) times daily.      Historical Provider, MD  cetirizine (ZYRTEC) 10 MG tablet Take 10 mg by mouth daily.    Historical Provider, MD  fluticasone (FLONASE) 50 MCG/ACT nasal spray Place 1 spray into both nostrils daily.    Historical Provider, MD  glipiZIDE (GLUCOTROL) 5 MG tablet Take 5 mg by mouth daily before breakfast.    Historical Provider, MD  hydrochlorothiazide (HYDRODIURIL) 25 MG tablet Take 25 mg by mouth daily.    Historical Provider, MD  HYDROcodone-acetaminophen (NORCO) 5-325 MG per tablet Take 1-2 tablets by mouth every 6 (six) hours as needed for severe pain. 03/26/15   Doug Sou, MD  lisinopril (PRINIVIL,ZESTRIL) 40 MG tablet Take 40 mg by mouth daily.    Historical Provider, MD  ondansetron (ZOFRAN) 8 MG tablet Take 1 tablet (8 mg total) by mouth every 8 (eight) hours as needed for nausea. 03/26/15   Doug Sou, MD  simvastatin (ZOCOR) 40 MG tablet Take 40 mg by mouth every evening.    Historical Provider, MD  sitaGLIPtin-metformin (JANUMET) 50-1000 MG per tablet Take 1 tablet by mouth 2 (two) times daily with a meal.    Historical Provider, MD  Tamsulosin HCl (FLOMAX) 0.4 MG CAPS Take 0.4 mg by mouth daily after supper.  Historical Provider, MD  traMADol (ULTRAM) 50 MG tablet Take 1 tablet (50 mg total) by mouth every 6 (six) hours as needed. 05/03/14   Estill BambergMark Dumonski, MD  verapamil (CALAN) 120 MG tablet Take 120 mg by mouth 3 (three) times daily.     Historical Provider, MD   BP 99/58 mmHg  Pulse 90  Temp(Src) 98.5 F (36.9 C) (Oral)  Resp 16  SpO2 98% Physical Exam  Constitutional: He is oriented to person, place, and time. No distress.  Alert, nicely groomed  HENT:  Head: Atraumatic.  Eyes:  Conjugate gaze, no eye redness/drainage  Neck: Neck supple.  Cardiovascular: Normal rate.   Pulmonary/Chest: No respiratory distress.  Abdominal: Soft. He exhibits no distension.  Musculoskeletal:  Normal range of motion.  Neurological: He is alert and oriented to person, place, and time.  Face symmetric, speech clear and coherent Patient able to walk into urgent care independently, and climb onto exam table.  Skin: Skin is warm and dry.  No cyanosis Abrasion just distal to the right knee, about 1 x 2", scabbed. Superficial abrasions to both feet. Right foot with abrasion next to 1st MTP medially. Left foot with abrasion at 1st MTP medially. Diffusely swollen, from dorsum of foot to the distal lower leg. Both sides of the ankle, with erythema at the medial ankle and significant warmth appreciated compared to the right foot. No bruising appreciated, no focal tenderness.  Nursing note and vitals reviewed.   ED Course  Procedures (including critical care time) Labs Review Labs Reviewed  GLUCOSE, CAPILLARY    Imaging Review Dg Ankle Complete Left  05/26/2015   CLINICAL DATA:  Status post fall while chasing dog. Diffuse left ankle pain, swelling and bruising. Initial encounter.  EXAM: LEFT ANKLE COMPLETE - 3+ VIEW  COMPARISON:  None.  FINDINGS: There is no evidence of fracture or dislocation. Minimal cortical irregularity along the medial malleolus is thought reflect remote injury, without definite evidence of acute fracture. The ankle mortise is intact; the interosseous space is within normal limits. No talar tilt or subluxation is seen. An os peroneum is noted.  The joint spaces are preserved. Diffuse soft tissue swelling is noted about the ankle.  IMPRESSION: 1. No evidence of fracture or dislocation. 2. Os peroneum noted. 3. Diffuse soft tissue swelling about the ankle.   Electronically Signed   By: Roanna RaiderJeffery  Chang M.D.   On: 05/26/2015 18:27      MDM   1. Left ankle sprain, initial encounter   2. Inflammatory arthropathy   3. Diabetes mellitus type 2, controlled   4. Abrasions of multiple sites    Boot orthosis for compression and to improve ability to ambulate.  Weight bear as  tolerated.  Rx naprosyn.  Rx bactrim for possibility of cellulitis (although would be early in time course from injury).  Followup pcp/Anthony Christin FudgeSteele in a week or so, to recheck ankle inflammation/pain, abrasions.  Recheck or FU pcp sooner for increasing redness/swellin/pain/drainage from wounds.    Eustace MooreLaura W Murray, MD 05/26/15 2229

## 2015-05-26 NOTE — Discharge Instructions (Signed)
Wear boot for ankle sprain (pain, swelling).  Naprosyn prescription for pain and inflammation (possibility of gout brought on by "minor" injury). Prescription for bactrim (antibiotic) to decrease likelihood of infection.  Wash abrasions gently with soap/water daily, apply antibiotic ointment.  Check wounds at the same time daily for increasing redness/swelling/pain/drainage; recheck at urgent care or with PCP/Anthony Christin Fudge if this occurs.  Ankle Sprain An ankle sprain is an injury to the strong, fibrous tissues (ligaments) that hold the bones of your ankle joint together.  CAUSES An ankle sprain is usually caused by a fall or by twisting your ankle. Ankle sprains most commonly occur when you step on the outer edge of your foot, and your ankle turns inward. People who participate in sports are more prone to these types of injuries.  SYMPTOMS   Pain in your ankle. The pain may be present at rest or only when you are trying to stand or walk.  Swelling.  Bruising. Bruising may develop immediately or within 1 to 2 days after your injury.  Difficulty standing or walking, particularly when turning corners or changing directions. DIAGNOSIS  Your caregiver will ask you details about your injury and perform a physical exam of your ankle to determine if you have an ankle sprain. During the physical exam, your caregiver will press on and apply pressure to specific areas of your foot and ankle. Your caregiver will try to move your ankle in certain ways. An X-ray exam may be done to be sure a bone was not broken or a ligament did not separate from one of the bones in your ankle (avulsion fracture).  TREATMENT  Certain types of braces can help stabilize your ankle. Your caregiver can make a recommendation for this. Your caregiver may recommend the use of medicine for pain. If your sprain is severe, your caregiver may refer you to a surgeon who helps to restore function to parts of your skeletal system  (orthopedist) or a physical therapist. HOME CARE INSTRUCTIONS   Apply ice to your injury for 1-2 days or as directed by your caregiver. Applying ice helps to reduce inflammation and pain.  Put ice in a plastic bag.  Place a towel between your skin and the bag.  Leave the ice on for 15-20 minutes at a time, every 2 hours while you are awake.  Only take over-the-counter or prescription medicines for pain, discomfort, or fever as directed by your caregiver.  Elevate your injured ankle above the level of your heart as much as possible for 2-3 days.  If your caregiver recommends crutches, use them as instructed. Gradually put weight on the affected ankle. Continue to use crutches or a cane until you can walk without feeling pain in your ankle.  If you have a plaster splint, wear the splint as directed by your caregiver. Do not rest it on anything harder than a pillow for the first 24 hours. Do not put weight on it. Do not get it wet. You may take it off to take a shower or bath.  You may have been given an elastic bandage to wear around your ankle to provide support. If the elastic bandage is too tight (you have numbness or tingling in your foot or your foot becomes cold and blue), adjust the bandage to make it comfortable.  If you have an air splint, you may blow more air into it or let air out to make it more comfortable. You may take your splint off at night and before  taking a shower or bath. Wiggle your toes in the splint several times per day to decrease swelling. SEEK MEDICAL CARE IF:   You have rapidly increasing bruising or swelling.  Your toes feel extremely cold or you lose feeling in your foot.  Your pain is not relieved with medicine. SEEK IMMEDIATE MEDICAL CARE IF:  Your toes are numb or blue.  You have severe pain that is increasing. MAKE SURE YOU:   Understand these instructions.  Will watch your condition.  Will get help right away if you are not doing well or get  worse. Document Released: 12/14/2005 Document Revised: 09/07/2012 Document Reviewed: 12/26/2011 [facility] Patient Information 2015 [place], [place]. This information is not intended to replace advice given to you by your health care provider. Make sure you discuss any questions you have with your health care provider.

## 2015-05-26 NOTE — ED Notes (Signed)
Pt     Reports  He  Felled  yest   While      Walking  A  Dog  And     inj  l  Foot     /  Ankle    Abrasion  To  r  Knee       And  Abrasions  To         Sides  Of    Both  Big     Toes             Pt   Reports        He   Was  Lightheaded  This       Am            And  States  He  Almost  Passed  Out  Last pm     He  denys  Hitting  His  Head  Or  Any  Vomiting

## 2015-12-02 ENCOUNTER — Ambulatory Visit: Payer: Self-pay | Attending: Podiatry

## 2015-12-16 ENCOUNTER — Ambulatory Visit (INDEPENDENT_AMBULATORY_CARE_PROVIDER_SITE_OTHER): Payer: No Typology Code available for payment source | Admitting: Podiatry

## 2015-12-16 ENCOUNTER — Encounter: Payer: Self-pay | Admitting: Podiatry

## 2015-12-16 VITALS — BP 132/72 | HR 73 | Resp 16 | Ht 70.0 in | Wt 205.0 lb

## 2015-12-16 DIAGNOSIS — B070 Plantar wart: Secondary | ICD-10-CM

## 2015-12-16 DIAGNOSIS — B079 Viral wart, unspecified: Secondary | ICD-10-CM

## 2015-12-16 DIAGNOSIS — R52 Pain, unspecified: Secondary | ICD-10-CM

## 2015-12-16 DIAGNOSIS — B078 Other viral warts: Secondary | ICD-10-CM

## 2015-12-16 NOTE — Progress Notes (Signed)
   Subjective:    Patient ID: Jearld PiesDavid Cradle, male    DOB: 13-Dec-1961, 54 y.o.   MRN: [id number]  HPI  Patient presents today for a callus in the bottom of his left foot on submetatarsal 2. This has been ongoing for several years. [facility] He denies any swelling or redness or drainage. No swelling. The area is painful particularly with shoe gear. No other complaints. Pt diabetic type 2; sugar=60 this am; A1C=5.3.   Review of Systems  Constitutional: Positive for diaphoresis and fatigue.  HENT: Positive for sinus pressure.   Neurological: Positive for headaches.  All other systems reviewed and are negative.      Objective:   Physical Exam General: AAO x3, NAD  Dermatological: Skin is warm, dry and supple bilateral. Nails x 10 are well manicured; remaining integument appears unremarkable at this time. Annular hyperkeratotic lesion left foot submetatarsal 2. Upon debridement there does appear to be some evidence of pinpoint bleeding with evidence of verruca which is needed today. Previously upon debridement there was just hyperkeratotic tissue present. There is no surrounding edema, erythema, drainage or purulence. No underlying ulceration or drainage.  Vascular: Dorsalis Pedis artery and Posterior Tibial artery pedal pulses are 2/4 bilateral with immedate capillary fill time. Pedal hair growth present. No varicosities and no lower extremity edema present bilateral. There is no pain with calf compression, swelling, warmth, erythema.   Neruologic: Grossly intact via light touch bilateral. Vibratory intact via tuning fork bilateral. Protective threshold with Semmes Wienstein monofilament intact to all pedal sites bilateral. Patellar and Achilles deep tendon reflexes 2+ bilateral. No Babinski or clonus noted bilateral.   Musculoskeletal: No gross boney pedal deformities bilateral. No pain, crepitus, or limitation noted with foot and ankle range of  motion bilateral. Muscular strength 5/5 in all groups tested bilateral.  Gait: Unassisted, Nonantalgic.      Assessment & Plan:  54 year old male left submetatarsal 2 verruca -Reviewed x-rays that he brought into the office. There did not appear to be any evidence of acute fracture. Metatarsal probable within normal limits. -Treatment options discussed including all alternatives, risks, and complications -Lesion debris without complications. The area was cleaned. A pad was placed around the lesion followed by salicylic acid. Post procedure instructions were discussed the patient. Monitor for any clinical signs or symptoms of infection and directed to call the office immediately should any occur or go to the ER. -Follow-up as scheduled or sooner if any problems arise. In the meantime, encouraged to call the office with any questions, concerns, change in symptoms. Follow-up with PCP for other issues mentioned in the review of systems.  Ovid CurdMatthew Wagoner, DPM

## 2015-12-17 ENCOUNTER — Encounter: Payer: Self-pay | Admitting: Podiatry

## 2016-01-06 ENCOUNTER — Ambulatory Visit: Payer: No Typology Code available for payment source | Admitting: Podiatry

## 2016-01-20 ENCOUNTER — Encounter: Payer: Self-pay | Admitting: Podiatry

## 2016-01-20 ENCOUNTER — Ambulatory Visit: Payer: No Typology Code available for payment source | Admitting: Podiatry

## 2016-01-20 DIAGNOSIS — B079 Viral wart, unspecified: Secondary | ICD-10-CM

## 2016-01-20 DIAGNOSIS — B078 Other viral warts: Secondary | ICD-10-CM

## 2016-01-20 NOTE — Progress Notes (Signed)
Patient ID: Zethan Alfieri, male   DOB: 16-Sep-1961, 55 y.o.   MRN: [id number]  Subjective: 55 year old male presents the office they for follow-up evaluation of likely verruca plantar left foot submetatarsal 2. He states the areas much better than what it was at last appointment has not been as painful. He denies any reaction to the medicine denies any redness, drainage or signs of infection. Denies any systemic complaints such as fevers, chills, nausea, vomiting. No acute changes since last appointment, and no other complaints at this time.   Objective: AAO x3, NAD DP/PT pulses palpable bilaterally, CRT less than 3 seconds Protective sensation intact with Simms Weinstein monofilament Left foot second metatarsal to hyperkeratotic lesion. Upon debridement debridement there was a small amount of pinpoint bleeding and verruca was present. Decrease tenderness to palpation around the area. No edema, erythema, drainage or other signs of infection. No areas of pinpoint bony tenderness or pain with vibratory sensation. MMT 5/5, ROM WNL. No edema, erythema, increase in warmth to bilateral lower extremities.  No open lesions or pre-ulcerative lesions.  No pain with calf compression, swelling, warmth, erythema  Assessment: 55 year old male left submetatarsal 2 verruca with resolving symptoms  Plan: -All treatment options discussed with the patient including all alternatives, risks, complications.  -Area was debrided today without, occasions. The area was cleaned. Pad was placed around the area followed by some lidocaine and reclusive babies. Postprocedure instructions were discussed. -Monitor for any clinical signs or symptoms of infection and directed to call the office immediately should any occur or go to the ER. -Follow-up in 3 weeks or sooner if any problems arise. In the meantime, encouraged to call the office with any questions, concerns, change in symptoms.   Ovid Curd, DPM

## 2016-02-11 ENCOUNTER — Encounter: Payer: Self-pay | Admitting: Podiatry

## 2016-02-11 ENCOUNTER — Ambulatory Visit: Payer: No Typology Code available for payment source | Admitting: Podiatry

## 2016-02-11 VITALS — BP 147/83 | HR 77 | Resp 16

## 2016-02-11 DIAGNOSIS — B079 Viral wart, unspecified: Secondary | ICD-10-CM

## 2016-02-11 DIAGNOSIS — B078 Other viral warts: Secondary | ICD-10-CM

## 2016-02-11 MED ORDER — SALICYLIC ACID 17 % EX GEL: Freq: Every day | CUTANEOUS | Status: DC

## 2016-02-13 ENCOUNTER — Encounter: Payer: Self-pay | Admitting: Podiatry

## 2016-02-13 NOTE — Progress Notes (Signed)
Patient ID: [name] [name], male   DOB: [birth year], 55 y.o.   MRN: [id number]   Subjective: 55 year old male presents the office they for follow-up evaluation of likely verruca plantar left foot submetatarsal 2. He states the area continues to improve compared to last appointment has not been as painful. He denies any reaction to the medicine denies any redness, drainage or signs of infection. Denies any systemic complaints such as fevers, chills, nausea, vomiting. No acute changes since last appointment, and no other complaints at this time.   Objective: AAO x3, NAD DP/PT pulses palpable bilaterally, CRT less than 3 seconds Protective sensation intact with Simms Weinstein monofilament Left foot second metatarsal to hyperkeratotic lesion. Upon debridement debridement there was  pinpoint bleeding and verruca was evident. Decrease tenderness to palpation around the area. No edema, erythema, drainage or other signs of infection. No areas of pinpoint bony tenderness or pain with vibratory sensation. MMT 5/5, ROM WNL. No edema, erythema, increase in warmth to bilateral lower extremities.  No open lesions or pre-ulcerative lesions.  No pain with calf compression, swelling, warmth, erythema  Assessment: 55 year old male left submetatarsal 2 verruca with resolving symptoms  Plan: -All treatment options discussed with the patient including all alternatives, risks, complications.  -Area was debrided today without, occasions. The area was cleaned. Cantharone was applied followed by an occlusive bandage. Post procedure tractions were discussed. -Was the area heals can start with salicylic acid treatments at home. Prescribed today. -Monitor for any clinical signs or symptoms of infection and directed to call the office immediately should any occur or go to the ER. -Follow-up in 3 weeks or sooner if any problems arise. In the meantime, encouraged to call the office with any questions, concerns, change in  symptoms.   Ovid Curd, DPM

## 2016-03-10 ENCOUNTER — Encounter: Payer: Self-pay | Admitting: Podiatry

## 2016-03-10 ENCOUNTER — Ambulatory Visit: Payer: No Typology Code available for payment source | Admitting: Podiatry

## 2016-03-10 VITALS — BP 145/80 | HR 88 | Resp 16

## 2016-03-10 DIAGNOSIS — B070 Plantar wart: Secondary | ICD-10-CM

## 2016-03-10 DIAGNOSIS — L600 Ingrowing nail: Secondary | ICD-10-CM

## 2016-03-10 NOTE — Progress Notes (Signed)
He presents as a patient of Dr. Ardelle AntonWagoner. He presents today for follow-up of a wart to the plantar aspect of the forefoot right. He is also complaining of a painful ingrown toenail to the hallux left along the fibular border.  Objective: Vital signs are stable he is alert and oriented 3. Evaluation of the wart demonstrates near complete resolution of the wart though there is a very small area centrally located that appears to be verrucoid in nature. His pulses remain palpable there are no open lesions or abrasions as he no signs of infection other than the viral infection. Left foot does demonstrate a mild area of erythema along the fibular border.  Assessment: Resolving wart plantar aspect of the left foot. Possible ingrown nail fibular border hallux left.  Plan: Discussed etiology pathology conservative versus surgical therapy. At this point he will follow-up with Dr. Ardelle AntonWagoner in 3-4 weeks for possible matrixectomy fibular border. He will call sooner if needed. We did apply acid under occlusion to be washed off thoroughly tomorrow morning and that he will restart his salicylic acid treatments. I encouraged him to continue these until he follows up with Dr. Ardelle AntonWagoner.

## 2016-03-11 ENCOUNTER — Telehealth: Payer: Self-pay | Admitting: *Deleted

## 2016-03-11 NOTE — Telephone Encounter (Signed)
Pt states he needs a note to be out of his PE class, after procedures performed 03/10/2016.  Dr. Al CorpusHyatt states write pt out of PE class until 03/16/2016.  Done.

## 2016-04-17 ENCOUNTER — Ambulatory Visit: Payer: No Typology Code available for payment source | Admitting: Podiatry

## 2016-04-17 ENCOUNTER — Encounter: Payer: Self-pay | Admitting: Podiatry

## 2016-04-17 VITALS — BP 142/74 | HR 72 | Resp 18

## 2016-04-17 DIAGNOSIS — B070 Plantar wart: Secondary | ICD-10-CM

## 2016-04-17 DIAGNOSIS — L600 Ingrowing nail: Secondary | ICD-10-CM

## 2016-04-20 ENCOUNTER — Encounter: Payer: Self-pay | Admitting: Podiatry

## 2016-04-20 NOTE — Progress Notes (Signed)
Patient ID: [name] [name], male   DOB: 02-22-1961, 55 y.o.   MRN: [id number]  Subjective: Patient presents today for follow-up evaluation of a lesion submetatarsal 2 on the left foot. He has areas much better compared to last appointment. No redness or swelling. Pain is improved. Office of ingrown nail that he had last appointment is resolving not having any problems. No pain, swelling, redness or drainage.  Denies any systemic complaints such as fevers, chills, nausea, vomiting. No acute changes since last appointment, and no other complaints at this time.   Objective: AAO x3, NAD DP/PT pulses palpable bilaterally, CRT less than 3 seconds Protective sensation intact with Simms Weinstein monofilament Left foot to metatarsal to hyperkeratotic lesion. Upon debridement there is no evidence of verruca and underlying skin is intact. There is no tenderness. No edema, erythema, drainage. There is no evidence of incurvation of the toenails this time. There is no pain to the toenails. No redness or drainage or any swelling.  No areas of pinpoint bony tenderness or pain with vibratory sensation. MMT 5/5, ROM WNL. No edema, erythema, increase in warmth to bilateral lower extremities.  No open lesions or pre-ulcerative lesions.  No pain with calf compression, swelling, warmth, erythema  Assessment: Resolving verruca left foot   Plan: -All treatment options discussed with the patient including all alternatives, risks, complications.  -Hyperkeratotic lesion debrided left foot without competitions or bleeding. He wishes to play further treatment at this point. Continue to monitor. Continue to monitor for any recurrent ingrown toenail. -Follow up in 4 weeks the symptoms are not resolved or sooner if any issues are to arise. -Patient encouraged to call the office with any questions, concerns, change in symptoms.   Ovid CurdMatthew Wagoner, DPM

## 2016-05-15 ENCOUNTER — Ambulatory Visit: Payer: No Typology Code available for payment source | Admitting: Podiatry

## 2016-05-15 ENCOUNTER — Telehealth: Payer: Self-pay | Admitting: *Deleted

## 2016-05-15 ENCOUNTER — Encounter: Payer: Self-pay | Admitting: Podiatry

## 2016-05-15 VITALS — BP 155/86 | HR 90 | Resp 18

## 2016-05-15 DIAGNOSIS — B070 Plantar wart: Secondary | ICD-10-CM

## 2016-05-15 DIAGNOSIS — M7989 Other specified soft tissue disorders: Secondary | ICD-10-CM

## 2016-05-15 DIAGNOSIS — M799 Soft tissue disorder, unspecified: Secondary | ICD-10-CM

## 2016-05-15 NOTE — Telephone Encounter (Addendum)
[facility]st [facility] 06/12/2016-DrArdelle Anton. Wagoner reviewed skin biopsy 05/15/2016 as negative. Informed pt of results and pt states the area is fine.

## 2016-05-20 ENCOUNTER — Encounter: Payer: Self-pay | Admitting: Podiatry

## 2016-05-20 NOTE — Progress Notes (Signed)
Patient ID: [name] [name], male   DOB: 1961-06-18, 55 y.o.   MRN: [id number]  Subjective:  Patient presents today for follow-up evaluation of a lesion submetatarsal 2 on the left foot. He states he is been using Compound W and has done well he said no pain in the area any drainage or warmth or any redness or swelling. Denies any systemic complaints such as fevers, chills, nausea, vomiting. No acute changes since last appointment, and no other complaints at this time.   Objective: AAO x3, NAD DP/PT pulses palpable bilaterally, CRT less than 3 seconds Protective sensation intact with Simms Weinstein monofilament Left foot to metatarsal to hyperkeratotic lesion. Upon debridement there is no evidence of verruca and underlying skin is intact. There is no tenderness. No edema, erythema, drainage.  Flesh-colored lesion present on the dorsal aspect of the left hallux. Unsure of how long this is been present. Denies any change in color but area has gotten somewhat bigger over the last couple months. No tenderness the area. No areas of pinpoint bony tenderness or pain with vibratory sensation. MMT 5/5, ROM WNL. No edema, erythema, increase in warmth to bilateral lower extremities.  No open lesions or pre-ulcerative lesions.  No pain with calf compression, swelling, warmth, erythema  Assessment: Resolving verruca left foot ; skin lesion left dorsal hallux  Plan: -All treatment options discussed with the patient including all alternatives, risks, complications.  -Hyperkeratotic lesion debrided left foot without complications or bleeding. No underlying wound. Hold off on further treatment as the area appears to be healed. Discussed reoccurrence and call the office should any occur. -Discussed biopsied lesion the dorsal left hallux. He wishes to hold off on punch biopsy about shave the lesion. I did shave the lesion and this was sent for biopsy. Small amount of bleeding occurred. In about ointment and a  bandage. Monitor for signs or symptoms of infection. -Follow-up as scheduled or after I receive the biopsy results.  [name] [name], DPM

## 2016-06-19 ENCOUNTER — Encounter: Payer: Self-pay | Admitting: Podiatry

## 2016-07-15 ENCOUNTER — Telehealth: Payer: Self-pay | Admitting: *Deleted

## 2016-07-15 NOTE — Telephone Encounter (Addendum)
Faxed CERTIFICATION OF DETERMINATION OF FINANCIAL NEED AND REQUEST OT OFFER PATIENT DISCOUNT. 1st fax number would not connect.  07/16/2016-[facility]ed [facility] [phone] for alternate fax [phone]. Faxed CDFNR form.

## 2017-03-26 ENCOUNTER — Encounter (HOSPITAL_COMMUNITY): Payer: Self-pay | Admitting: *Deleted

## 2017-03-26 ENCOUNTER — Ambulatory Visit (HOSPITAL_COMMUNITY)
Admission: EM | Admit: 2017-03-26 | Discharge: 2017-03-26 | Disposition: A | Discharge status: Home / Self Care | Payer: Self-pay | Attending: Internal Medicine | Admitting: Internal Medicine

## 2017-03-26 ENCOUNTER — Telehealth (HOSPITAL_COMMUNITY): Payer: Self-pay | Admitting: Emergency Medicine

## 2017-03-26 DIAGNOSIS — R040 Epistaxis: Secondary | ICD-10-CM

## 2017-03-26 DIAGNOSIS — R21 Rash and other nonspecific skin eruption: Secondary | ICD-10-CM

## 2017-03-26 MED ORDER — TRIAMCINOLONE ACETONIDE 0.1 % EX CREA: 1.0000 "application " | TOPICAL_CREAM | Freq: Two times a day (BID) | CUTANEOUS | 1 refills | Status: DC

## 2017-03-26 NOTE — ED Provider Notes (Signed)
CSN: [id number]     Arrival date & time 03/26/17  1817 History   None    Chief Complaint  Patient presents with  . Epistaxis   (Consider location/radiation/quality/duration/timing/severity/associated sxs/prior Treatment) 56 year old male presents to clinic for evaluation of recurrent nosebleed ongoing for approximately 2-3 months. States he had been taking over-the-counter Benadryl for rash, and is concerned that this may have "dried out" his nose.   The history is provided by the patient.  Epistaxis  Location:  L nare Severity:  Moderate Duration: 2 months. Timing:  Intermittent Progression:  Waxing and waning Chronicity:  Recurrent Context: not anticoagulants, not aspirin use, not foreign body, not home oxygen, not nose picking and not recent infection   Relieved by:  Applying pressure Worsened by:  Sneezing Associated symptoms: no congestion, no cough, no dizziness, no facial pain, no headaches, no sinus pain, no sneezing, no sore throat and no syncope     Past Medical History:  Diagnosis Date  . Depression   . Diabetes mellitus    Type 2  . Enlarged prostate   . Environmental allergies   . Environmental and seasonal allergies   . GERD (gastroesophageal reflux disease)   . Headache(784.0)    sinus and migraines  . Hypertension   . IBS (irritable bowel syndrome)   . Sleep apnea    BiPAP   Past Surgical History:  Procedure Laterality Date  . KNEE ARTHROSCOPY Left   . SACROILIAC JOINT FUSION Right 05/02/2014   Procedure: SACROILIAC JOINT FUSION;  Surgeon: Emilee Hero, MD;  Location: [facility] OR;  Service: Orthopedics;  Laterality: Right;  Right sided sacroiliac joint fusion  . TONSILLECTOMY     uvula removed   History reviewed. No pertinent family history. Social History  Substance Use Topics  . Smoking status: Former Smoker    Packs/day: 1.00    Years: 10.00    Quit date: 04/20/1992  . Smokeless tobacco: Never Used  . Alcohol use No    Review of Systems   HENT: Positive for nosebleeds. Negative for congestion, sinus pain, sneezing and sore throat.   Respiratory: Negative for cough, shortness of breath and wheezing.   Cardiovascular: Negative for syncope.  Gastrointestinal: Negative for abdominal pain, constipation, nausea and vomiting.  Musculoskeletal: Negative for neck pain and neck stiffness.  Neurological: Negative for dizziness, syncope and headaches.  All other systems reviewed and are negative.   Allergies  Pravastatin  Home Medications   Prior to Admission medications   Medication Sig Start Date End Date Taking? Authorizing Provider  buPROPion (WELLBUTRIN SR) 200 MG 12 hr tablet Take 200 mg by mouth 2 (two) times daily.    Historical Provider, MD  glipiZIDE (GLUCOTROL) 5 MG tablet Take 5 mg by mouth daily before breakfast.    Historical Provider, MD  salicylic acid 17 % gel Apply topically daily. 02/11/16   Vivi Barrack, DPM  simvastatin (ZOCOR) 40 MG tablet Take 40 mg by mouth daily.    Historical Provider, MD  sitaGLIPtin-metformin (JANUMET) 50-1000 MG tablet Take 1 tablet by mouth 2 (two) times daily with a meal.    Historical Provider, MD  tamsulosin (FLOMAX) 0.4 MG CAPS capsule Take 0.4 mg by mouth daily.    Historical Provider, MD  triamcinolone cream (KENALOG) 0.1 % Apply 1 application topically 2 (two) times daily. 03/26/17   Dorena Bodo, NP   Meds Ordered and Administered this Visit  Medications - No data to display  BP (!) 175/92 (BP Location: Right Arm)  Pulse 96   Temp 98.6 F (37 C) (Oral)   SpO2 100%  No data found.   Physical Exam  Constitutional: He is oriented to person, place, and time. He appears well-developed and well-nourished. No distress.  HENT:  Head: Normocephalic and atraumatic.  Right Ear: External ear normal.  Left Ear: External ear normal.  Nose: No mucosal edema or rhinorrhea. No epistaxis. Right sinus exhibits no maxillary sinus tenderness and no frontal sinus tenderness. Left  sinus exhibits no maxillary sinus tenderness and no frontal sinus tenderness.  Area of redness, inflammation, and erythema in the left nare, clot in place, not actively bleeding.  Cardiovascular: Normal rate and regular rhythm.   Pulmonary/Chest: Effort normal and breath sounds normal.  Musculoskeletal: He exhibits no edema or tenderness.  Neurological: He is alert and oriented to person, place, and time.  Skin: Skin is warm and dry. Capillary refill takes less than 2 seconds. Rash noted. Rash is maculopapular (right forearm). He is not diaphoretic.  Psychiatric: He has a normal mood and affect. His behavior is normal.  Nursing note and vitals reviewed.   Urgent Care Course     Procedures (including critical care time)  Labs Review Labs Reviewed - No data to display  Imaging Review No results found.  MDM   1. Epistaxis   2. Rash    Presents epistaxis, I recommend sleeping with a humidifier, discontinuing Benadryl, and the use of over-the-counter Afrin to control bleeding as needed  The rash on his forearm, have prescribed triamcinolone cream, apply to the affected area 2-3 times a day as needed.       Dorena Bodo, NP 03/26/17 [phone]

## 2017-03-26 NOTE — ED Triage Notes (Signed)
Pt  Reports    A  Nosebleed  Off    And on  For   3   Weeks   Pt        Reports      The blood  Is anterior       And  Bleeds    Heavily  At  Times   It  Comes  From the  Left  Nostril

## 2017-03-26 NOTE — Discharge Instructions (Signed)
For your nosebleeds, however recommend he stop taking Benadryl. I recommend you buy a humidifier for your room, and sleep with a humidifier on at night. For active bleeding, you may use over-the-counter Afrin 2 sprays in the affected nostril. Should your bleeding continue, I recommend you follow-up with an ear nose and throat specialist for further evaluation and management of your condition.  For your rash, I prescribed a steroid cream, apply to the affected area twice a day as needed.

## 2017-03-26 NOTE — Telephone Encounter (Signed)
Patient requested that his medications be resent to [facility].

## 2018-02-07 ENCOUNTER — Encounter (HOSPITAL_COMMUNITY): Payer: Self-pay | Admitting: Emergency Medicine

## 2018-02-07 ENCOUNTER — Ambulatory Visit (HOSPITAL_COMMUNITY)
Admission: EM | Admit: 2018-02-07 | Discharge: 2018-02-07 | Disposition: A | Discharge status: Home / Self Care | Payer: Self-pay | Attending: Internal Medicine | Admitting: Internal Medicine

## 2018-02-07 DIAGNOSIS — J019 Acute sinusitis, unspecified: Secondary | ICD-10-CM

## 2018-02-07 MED ORDER — AMOXICILLIN-POT CLAVULANATE 875-125 MG PO TABS: 1.0000 | ORAL_TABLET | Freq: Two times a day (BID) | ORAL | 0 refills | Status: AC

## 2018-02-07 NOTE — Discharge Instructions (Signed)
Push fluids to ensure adequate hydration and keep secretions thin.  Tylenol and/or ibuprofen as needed for pain or fevers.  Complete course of antibiotics.   May use your flonase daily to see if this improves symptoms. Continue with daily zyrtec. Please follow up with your primary care provider for a recheck of your symptoms in the next 1-2 weeks.  If worsening of symptoms may return or visit the Er.

## 2018-02-07 NOTE — ED Triage Notes (Signed)
PT reports sinus drainage and right sided jaw pain for over one week.

## 2018-02-07 NOTE — ED Provider Notes (Signed)
[facility]    CSN: [id number] Arrival date & time: 02/07/18  1419     History   Chief Complaint Chief Complaint  Patient presents with  . Facial Pain  . Jaw Pain    HPI [name] [name] is a 57 y.o. male.   [name] [name] presents with complaints of right sided face and jaw pain as well as sinus congestion which started 2/2. He states it has somewhat waxed and waned but has been worse over the past three days. Without fevers or ear pain. Sore throat has improved. Takes zyrtec daily as he does have allergies. Tried a singulair which seemed to exacerbate symptoms and increased runny nose. Benadryl did not help. Without cough. Without pain with eating or chewing, denies dental pain. Has had his tonsils removed. Pain into right cervical lymph nodes.  No known ill contacts. Denies any previous similar illness. Rates pain 6/10.   ROS per HPI.       Past Medical History:  Diagnosis Date  . Depression   . Diabetes mellitus    Type 2  . Enlarged prostate   . Environmental allergies   . Environmental and seasonal allergies   . GERD (gastroesophageal reflux disease)   . Headache(784.0)    sinus and migraines  . Hypertension   . IBS (irritable bowel syndrome)   . Sleep apnea    BiPAP    Patient Active Problem List   Diagnosis Date Noted  . 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 Thoracic or lumbosacral neuritis or radiculitis, unspecified 06/24/2012  . Degeneration of lumbar or lumbosacral intervertebral disc 06/24/2012    Past Surgical History:  Procedure Laterality Date  . KNEE ARTHROSCOPY Left   . SACROILIAC JOINT FUSION Right 05/02/2014   Procedure: SACROILIAC JOINT FUSION;  Surgeon: Emilee HeroMark Leonard Dumonski, MD;  [facility];  Service: Orthopedics;  Laterality: Right;  Right sided sacroiliac joint fusion  . TONSILLECTOMY     uvula removed       Home Medications    Prior to Admission medications   Medication Sig Start Date End Date Taking? Authorizing  Provider  buPROPion (WELLBUTRIN SR) 200 MG 12 hr tablet Take 200 mg by mouth 2 (two) times daily.   Yes [provider]  cetirizine (ZYRTEC) 10 MG tablet Take 10 mg by mouth daily.   Yes [provider]  cloNIDine (CATAPRES) 0.1 MG tablet Take 0.2 mg by mouth 2 (two) times daily.   Yes [provider]  hydrALAZINE (APRESOLINE) 25 MG tablet Take 25 mg by mouth 3 (three) times daily.   Yes [provider]  hydrochlorothiazide (HYDRODIURIL) 25 MG tablet Take 25 mg by mouth daily.   Yes [provider]  lisinopril (PRINIVIL,ZESTRIL) 40 MG tablet Take 40 mg by mouth daily.   Yes [provider]  tamsulosin (FLOMAX) 0.4 MG CAPS capsule Take 0.4 mg by mouth daily.   Yes [provider]  amoxicillin-clavulanate (AUGMENTIN) 875-125 MG tablet Take 1 tablet by mouth every 12 (twelve) hours for 10 days. 02/07/18 02/17/18  Georgetta HaberBurky, Kaydince Towles B, NP  glipiZIDE (GLUCOTROL) 5 MG tablet Take 5 mg by mouth daily before breakfast.    [provider]  salicylic acid 17 % gel Apply topically daily. 02/11/16   Vivi BarrackWagoner, Matthew R, DPM  simvastatin (ZOCOR) 40 MG tablet Take 40 mg by mouth daily.    [provider]  sitaGLIPtin-metformin (JANUMET) 50-1000 MG tablet Take 1 tablet by mouth 2 (two) times daily with a meal.  [provider]  triamcinolone cream (KENALOG) 0.1 % Apply 1 application topically 2 (two) times daily. 03/26/17   Dorena Bodo, NP    Family History No family history on file.  Social History Social History   Tobacco Use  . Smoking status: Former Smoker    Packs/day: 1.00    Years: 10.00    Pack years: 10.00    Last attempt to quit: 04/20/1992    Years since quitting: 25.8  . Smokeless tobacco: Never Used  Substance Use Topics  . Alcohol use: No  . Drug use: No     Allergies   Pravastatin   Review of Systems Review of Systems   Physical Exam Triage Vital Signs ED Triage Vitals [02/07/18 1511]   Enc Vitals Group     BP (!) 164/74     Pulse Rate 79     Resp 16     Temp 98.4 F (36.9 C)     Temp Source Oral     SpO2 98 %     Weight 224 lb (101.6 kg)     Height 5\' 10"  (1.778 m)     Head Circumference      Peak Flow      Pain Score 6     Pain Loc      Pain Edu?      Excl. in GC?    No data found.  Updated Vital Signs BP (!) 164/74   Pulse 79   Temp 98.4 F (36.9 C) (Oral)   Resp 16   Ht 5\' 10"  (1.778 m)   Wt 224 lb (101.6 kg)   SpO2 98%   BMI 32.14 kg/m   Visual Acuity Right Eye Distance:   Left Eye Distance:   Bilateral Distance:    Right Eye Near:   Left Eye Near:    Bilateral Near:     Physical Exam  Constitutional: He is oriented to person, place, and time. He appears well-developed and well-nourished.  HENT:  Head: Normocephalic and atraumatic.    Right Ear: Tympanic membrane, external ear and ear canal normal.  Left Ear: Tympanic membrane, external ear and ear canal normal.  Nose: Rhinorrhea present. Right sinus exhibits no maxillary sinus tenderness and no frontal sinus tenderness. Left sinus exhibits no maxillary sinus tenderness and no frontal sinus tenderness.  Mouth/Throat: Uvula is midline, oropharynx is clear and moist and mucous membranes are normal.  Right facial tenderness which extends into neck with lymphadenopathy present; without obvious palpable abscess, edema or dental concern; jaw without tenderness  Eyes: Conjunctivae are normal. Pupils are equal, round, and reactive to light.  Neck: Normal range of motion.  Cardiovascular: Normal rate and regular rhythm.  Pulmonary/Chest: Effort normal and breath sounds normal.  Lymphadenopathy:    He has cervical adenopathy.  Neurological: He is alert and oriented to person, place, and time.  Skin: Skin is warm and dry.  Vitals reviewed.    UC Treatments / Results  Labs (all labs ordered are listed, but only abnormal results are displayed) Labs Reviewed - No data to display  EKG   EKG Interpretation None       Radiology No results found.  Procedures Procedures (including critical care time)  Medications Ordered in UC Medications - No data to display   Initial Impression / Assessment and Plan / UC Course  I have reviewed the triage vital signs and the nursing notes.  Pertinent labs & imaging results that were available during my care of the  patient were reviewed by me and considered in my medical decision making (see chart for details).     Significant congestion symptoms with primarily right sided facial and neck pain. Without obvious parotid, dental, or abscess as source of pain. Without edema. Will treat as sinusitis at this time. Encouraged close follow up with PCP for recheck. Push fluids. Continue with daily zyrtec. May use daily flonase, he has this but has not been using. Patient verbalized understanding and agreeable to plan.    Final Clinical Impressions(s) / UC Diagnoses   Final diagnoses:  Acute sinusitis, recurrence not specified, unspecified location    ED Discharge Orders        Ordered    amoxicillin-clavulanate (AUGMENTIN) 875-125 MG tablet  Every 12 hours     02/07/18 1629       Controlled Substance Prescriptions [place] Controlled Substance Registry consulted? Not Applicable   Georgetta Haber, NP 02/07/18 1635

## 2018-04-03 ENCOUNTER — Ambulatory Visit (HOSPITAL_COMMUNITY)
Admission: EM | Admit: 2018-04-03 | Discharge: 2018-04-03 | Disposition: A | Discharge status: Home / Self Care | Payer: Self-pay | Attending: Internal Medicine | Admitting: Internal Medicine

## 2018-04-03 ENCOUNTER — Encounter (HOSPITAL_COMMUNITY): Payer: Self-pay | Admitting: *Deleted

## 2018-04-03 ENCOUNTER — Other Ambulatory Visit: Payer: Self-pay

## 2018-04-03 DIAGNOSIS — L2389 Allergic contact dermatitis due to other agents: Secondary | ICD-10-CM

## 2018-04-03 HISTORY — DX: Anxiety disorder, unspecified: F41.9

## 2018-04-03 MED ORDER — PREDNISONE 50 MG PO TABS: 50.0000 mg | ORAL_TABLET | Freq: Every day | ORAL | 0 refills | Status: DC

## 2018-04-03 NOTE — ED Provider Notes (Signed)
[facility]    CSN: [id number] Arrival date & time: 04/03/18  1223     History   Chief Complaint Chief Complaint  Patient presents with  . Poison Ivy    HPI [name] [name] is a 57 y.o. male presenting today with exposure to poison ivy with a rash as well as concerned about his anxiety.  Is presenting mainly today for evaluation of poison ivy.  He has been using IV relief and is felt like this is worsened his pain.  Symptoms began 4 days ago after he was exposed to poison ivy.  Symptoms have slowly worsened and developed oozing blisters on his bilateral hands.  Feels like it is spread to his face and is getting closer to his eyes.  He is in significant discomfort, pain and itching.  Has history of breaking out to poison ivy, but states that it is not been this bad in the past.  Denies any other new exposures.  Patient is also emotional as he states that he recently obtained a new job which she starts this Tuesday.  He states that since the rash has broken out as well as getting the news of starting a new job his anxiety has been worse.  He states that he will get emotional all of a sudden and is overwhelmed with difficulty breathing, crying and feeling a pressure sensation.  He has been on Wellbutrin for many years.  Has tried many other medicines in the past.  Does not use anything for acute symptoms.  HPI  Past Medical History:  Diagnosis Date  . Anxiety   . Depression   . Diabetes mellitus    Type 2  . Enlarged prostate   . Environmental allergies   . Environmental and seasonal allergies   . GERD (gastroesophageal reflux disease)   . Headache(784.0)    sinus and migraines  . Hypertension   . IBS (irritable bowel syndrome)   . Sleep apnea    BiPAP    Patient Active Problem List   Diagnosis Date Noted  . 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 Thoracic or lumbosacral neuritis or radiculitis, unspecified 06/24/2012  . Degeneration of lumbar or lumbosacral  intervertebral disc 06/24/2012    Past Surgical History:  Procedure Laterality Date  . KNEE ARTHROSCOPY Left   . SACROILIAC JOINT FUSION Right 05/02/2014   Procedure: SACROILIAC JOINT FUSION;  Surgeon: Emilee Hero, MD;  Location: [facility] OR;  Service: Orthopedics;  Laterality: Right;  Right sided sacroiliac joint fusion  . TONSILLECTOMY     uvula removed       Home Medications    Prior to Admission medications   Medication Sig Start Date End Date Taking? Authorizing Provider  buPROPion (WELLBUTRIN SR) 200 MG 12 hr tablet Take 200 mg by mouth 2 (two) times daily.   Yes [provider]  cetirizine (ZYRTEC) 10 MG tablet Take 10 mg by mouth daily.   Yes [provider]  cloNIDine (CATAPRES) 0.1 MG tablet Take 0.2 mg by mouth 2 (two) times daily.   Yes [provider]  hydrALAZINE (APRESOLINE) 25 MG tablet Take 25 mg by mouth 3 (three) times daily.   Yes [provider]  hydrochlorothiazide (HYDRODIURIL) 25 MG tablet Take 25 mg by mouth daily.   Yes [provider]  lisinopril (PRINIVIL,ZESTRIL) 40 MG tablet Take 40 mg by mouth daily.   Yes [provider]  simvastatin (ZOCOR) 40 MG tablet Take 40 mg by mouth daily.   Yes  [provider]  tamsulosin (FLOMAX) 0.4 MG CAPS capsule Take 0.4 mg by mouth daily.   Yes [provider]  glipiZIDE (GLUCOTROL) 5 MG tablet Take 5 mg by mouth daily before breakfast.    [provider]  predniSONE (DELTASONE) 50 MG tablet Take 1 tablet (50 mg total) by mouth daily for 5 days. 04/03/18 04/08/18  Shanie Mauzy C, PA-C  salicylic acid 17 % gel Apply topically daily. 02/11/16   Vivi BarrackWagoner, Matthew R, DPM  sitaGLIPtin-metformin (JANUMET) 50-1000 MG tablet Take 1 tablet by mouth 2 (two) times daily with a meal.    [provider]  triamcinolone cream (KENALOG) 0.1 % Apply 1 application topically 2 (two) times daily. 03/26/17   Dorena BodoKennard, [place], NP    Family History No  family history on file.  Social History Social History   Tobacco Use  . Smoking status: Former Smoker    Packs/day: 1.00    Years: 10.00    Pack years: 10.00    Last attempt to quit: 04/20/1992    Years since quitting: 25.9  . Smokeless tobacco: Never Used  Substance Use Topics  . Alcohol use: No  . Drug use: No     Allergies   Pravastatin   Review of Systems Review of Systems  Constitutional: Negative for activity change, appetite change, fatigue and fever.  Eyes: Negative for photophobia, pain, redness and itching.  Respiratory: Negative for shortness of breath.   Cardiovascular: Negative for chest pain.  Gastrointestinal: Negative for abdominal pain, nausea and vomiting.  Musculoskeletal: Positive for myalgias. Negative for arthralgias and back pain.  Skin: Positive for color change and rash.  Neurological: Negative for dizziness, weakness, light-headedness, numbness and headaches.     Physical Exam Triage Vital Signs ED Triage Vitals [04/03/18 1315]  Enc Vitals Group     BP (!) 170/88     Pulse Rate 89     Resp 16     Temp 98 F (36.7 C)     Temp Source Oral     SpO2 98 %     Weight      Height      Head Circumference      Peak Flow      Pain Score 0     Pain Loc      Pain Edu?      Excl. in GC?    No data found.  Updated Vital Signs BP (!) 170/88   Pulse 89   Temp 98 F (36.7 C) (Oral)   Resp 16   SpO2 98%   Visual Acuity Right Eye Distance:   Left Eye Distance:   Bilateral Distance:    Right Eye Near:   Left Eye Near:    Bilateral Near:     Physical Exam  Constitutional: He appears well-developed and well-nourished.  HENT:  Head: Normocephalic and atraumatic.  Eyes: Pupils are equal, round, and reactive to light. Conjunctivae and EOM are normal.  Conjunctiva nonerythematous, no drainage or discharge  Neck: Neck supple.  Cardiovascular: Normal rate and regular rhythm.  No murmur heard. Pulmonary/Chest: Effort normal and breath  sounds normal. No respiratory distress.  Abdominal: Soft. There is no tenderness.  Musculoskeletal: He exhibits no edema.  Neurological: He is alert.  Skin: Skin is warm and dry.  Bilateral hands with linear, erythematous and oozing and crusting lesions.  Entire face appears erythematous, small papular lesions to chin and above upper lip.  No obvious bumps encroaching eye  Psychiatric: He has  a normal mood and affect.  Nursing note and vitals reviewed.        UC Treatments / Results  Labs (all labs ordered are listed, but only abnormal results are displayed) Labs Reviewed - No data to display  EKG None Radiology No results found.  Procedures Procedures (including critical care time)  Medications Ordered in UC Medications - No data to display   Initial Impression / Assessment and Plan / UC Course  I have reviewed the triage vital signs and the nursing notes.  Pertinent labs & imaging results that were available during my care of the patient were reviewed by me and considered in my medical decision making (see chart for details).     Patient with what appears to be a contact dermatitis.  Given that it is spread to his face, will provide prednisone orally for 5 days.  Patient is a diabetic, but feel benefit is greater than the risk at this time.  Advised that this will raise his blood sugars that he should monitor accordingly.  Patient verbalized understanding.  Also recommended to try Zanafel for topical relief.  Advised that anxiety may be worsening in relation to combination of this rash occurring.  Advised to follow-up with primary care if symptoms persisting without improvement with resolution of the rash.   Discussed strict return precautions. Patient verbalized understanding and is agreeable with plan.   Final Clinical Impressions(s) / UC Diagnoses   Final diagnoses:  Allergic contact dermatitis due to other agents    ED Discharge Orders        Ordered     predniSONE (DELTASONE) 50 MG tablet  Daily     04/03/18 1401       Controlled Substance Prescriptions [place] Controlled Substance Registry consulted? Not Applicable   Lew Dawes, [place] 04/03/18 2252

## 2018-04-03 NOTE — ED Triage Notes (Signed)
Reports starting with pruritic, blistery, oozing rash to bilat hands and face x 2 days after exposure to poison ivy.

## 2018-04-03 NOTE — Discharge Instructions (Signed)
Please begin prednisone daily for 5 days- this will increase your blood sugars, please monitor  You may also try zanafel cream over the counter for relief.

## 2018-04-06 ENCOUNTER — Encounter (HOSPITAL_COMMUNITY): Payer: Self-pay | Admitting: Emergency Medicine

## 2018-04-06 ENCOUNTER — Telehealth (HOSPITAL_COMMUNITY): Payer: Self-pay

## 2018-04-06 ENCOUNTER — Ambulatory Visit (HOSPITAL_COMMUNITY)
Admission: EM | Admit: 2018-04-06 | Discharge: 2018-04-06 | Disposition: A | Discharge status: Home / Self Care | Payer: Self-pay | Attending: Family Medicine | Admitting: Family Medicine

## 2018-04-06 DIAGNOSIS — L255 Unspecified contact dermatitis due to plants, except food: Secondary | ICD-10-CM

## 2018-04-06 MED ORDER — METHYLPREDNISOLONE SODIUM SUCC 125 MG IJ SOLR
80.0000 mg | Freq: Once | INTRAMUSCULAR | Status: AC
  Administered 2018-04-06: 80 mg via INTRAMUSCULAR

## 2018-04-06 MED ORDER — METHYLPREDNISOLONE SODIUM SUCC 125 MG IJ SOLR
INTRAMUSCULAR | Status: AC
  Filled 2018-04-06: qty 2

## 2018-04-06 MED ORDER — PREDNISONE 10 MG (48) PO TBPK: ORAL_TABLET | ORAL | 0 refills | Status: DC

## 2018-04-06 NOTE — Telephone Encounter (Signed)
Pt called reporting his poison ivy was worse with treatment.  Asking if we could call something else in for him. Encouraged patient to come back and be seen if he felt it was not getting better with treatment.

## 2018-04-06 NOTE — Discharge Instructions (Signed)
Please monitor your blood sugar levels. Prednisone will cause them to rise.

## 2018-04-06 NOTE — ED Triage Notes (Signed)
Pt was seen here Sunday for poison ivy and its gotten worse.

## 2018-04-06 NOTE — ED Provider Notes (Signed)
[facility]   [id number] 04/06/18 Arrival Time: 1723  ASSESSMENT & PLAN:  1. Rhus dermatitis    Meds ordered this encounter  Medications  . methylPREDNISolone sodium succinate (SOLU-MEDROL) 125 mg/2 mL injection 80 mg  . predniSONE (STERAPRED UNI-PAK 48 TAB) 10 MG (48) TBPK tablet    Sig: Take as directed.    Dispense:  48 tablet    Refill:  0   Will follow up with PCP or here if worsening or failing to improve as anticipated. Reviewed expectations re: course of current medical issues. Questions answered. Outlined signs and symptoms indicating need for more acute intervention. Patient verbalized understanding. After Visit Summary given.   SUBJECTIVE:  [name] [name] is a 57 y.o. male who presents with a skin complaint.   Location: mostly R dorsal hand; some on L forearm and chest Onset: abrupt Duration: several days Pruritic? Yes Painful? Somewhat Progression: increasing steadily  Drainage? No  Known trigger? Poison ivy. Seen here about 4 days ago; has been taking 50mg  prednisone daily with just mild help. Other associated symptoms: none Denies fever.  ROS: As per HPI.  OBJECTIVE: Vitals:   04/06/18 1739  BP: (!) 160/76  Pulse: 82  Resp: 18  Temp: 98 F (36.7 C)  SpO2: 97%    General appearance: alert; no distress Lungs: clear to auscultation bilaterally Heart: regular rate and rhythm Extremities: no edema Skin: warm and dry; rhus dermatitis of R dorsal hand, L forearm, with some areas on chest wall; no sign of infection Psychological: alert and cooperative; normal mood and affect  Allergies  Allergen Reactions  . Pravastatin Other (See Comments)    Joint myalgias    Past Medical History:  Diagnosis Date  . Anxiety   . Depression   . Diabetes mellitus    Type 2  . Enlarged prostate   . Environmental allergies   . Environmental and seasonal allergies   . GERD (gastroesophageal reflux disease)   . Headache(784.0)    sinus and  migraines  . Hypertension   . IBS (irritable bowel syndrome)   . Sleep apnea    BiPAP   Social History   Socioeconomic History  . Marital status: Single    Spouse name: Not on file  . Number of children: Not on file  . Years of education: Not on file  . Highest education level: Not on file  Occupational History  . Not on file  Social Needs  . Financial resource strain: Not on file  . Food insecurity:    Worry: Not on file    Inability: Not on file  . Transportation needs:    Medical: Not on file    Non-medical: Not on file  Tobacco Use  . Smoking status: Former Smoker    Packs/day: 1.00    Years: 10.00    Pack years: 10.00    Last attempt to quit: 04/20/1992    Years since quitting: 25.9  . Smokeless tobacco: Never Used  Substance and Sexual Activity  . Alcohol use: No  . Drug use: No  . Sexual activity: Not on file  Lifestyle  . Physical activity:    Days per week: Not on file    Minutes per session: Not on file  . Stress: Not on file  Relationships  . Social connections:    Talks on phone: Not on file    Gets together: Not on file    Attends religious service: Not on file    Active member of  club or organization: Not on file    Attends meetings of clubs or organizations: Not on file    Relationship status: Not on file  . Intimate partner violence:    Fear of current or ex partner: Not on file    Emotionally abused: Not on file    Physically abused: Not on file    Forced sexual activity: Not on file  Other Topics Concern  . Not on file  Social History Narrative  . Not on file   No family history on file. Past Surgical History:  Procedure Laterality Date  . KNEE ARTHROSCOPY Left   . SACROILIAC JOINT FUSION Right 05/02/2014   Procedure: SACROILIAC JOINT FUSION;  Surgeon: Emilee Hero, MD;  Location: [facility] OR;  Service: Orthopedics;  Laterality: Right;  Right sided sacroiliac joint fusion  . TONSILLECTOMY     uvula removed     Mardella Layman,  MD 04/06/18 1755

## 2018-05-30 ENCOUNTER — Ambulatory Visit: Payer: Self-pay | Attending: Family Medicine | Admitting: Podiatry

## 2018-05-30 DIAGNOSIS — M7741 Metatarsalgia, right foot: Secondary | ICD-10-CM

## 2018-05-30 DIAGNOSIS — M7742 Metatarsalgia, left foot: Secondary | ICD-10-CM

## 2018-06-01 NOTE — Progress Notes (Signed)
Subjective: 57 [facility] to the balls of his foot he points to submetatarsal 2 on both sides.  He says the warts have resolved he gets pain and feels that he is walking on a rock to this area.  No recent injury or falls.  He is currently looking to getting new shoes soon.  No swelling or redness that he has noticed.  No numbness or tingling or sharp pains that are new.  No other concerns. Denies any systemic complaints such as fevers, chills, nausea, vomiting. No acute changes since last appointment, and no other complaints at this time.   Objective: AAO x3, NAD DP/PT pulses palpable bilaterally, CRT less than 3 seconds There is no evidence of any recurrence of the verruca.  There is tenderness submetatarsal 2 bilaterally and there is prominence of metatarsal head plantarly.  There is atrophy of the fat pad.  There is no edema, erythema, increase in warmth.  No open lesions or pre-ulcerative lesions.  No pain with calf compression, swelling, warmth, erythema  Assessment: Submetatarsal 2 pain, metatarsalgia  Plan: -All treatment options discussed with the patient including all alternatives, risks, complications.  -Symptoms are more related to prominence of metatarsal heads.  Discussed offloading as well as new shoes.  We will mail him some metatarsal offloading pads. -Patient encouraged to call the office with any questions, concerns, change in symptoms.   Vivi BarrackMatthew R Avyay Coger DPM

## 2018-09-26 LAB — HM COLONOSCOPY

## 2018-09-27 HISTORY — PX: COLONOSCOPY: SHX174

## 2019-01-30 ENCOUNTER — Ambulatory Visit: Payer: Commercial Managed Care - PPO | Admitting: Allergy

## 2019-01-30 ENCOUNTER — Encounter: Payer: Self-pay | Admitting: Allergy

## 2019-01-30 VITALS — BP 116/60 | HR 80 | Resp 16 | Ht 70.0 in | Wt 229.0 lb

## 2019-01-30 DIAGNOSIS — J3089 Other allergic rhinitis: Secondary | ICD-10-CM

## 2019-01-30 DIAGNOSIS — G4733 Obstructive sleep apnea (adult) (pediatric): Secondary | ICD-10-CM

## 2019-01-30 MED ORDER — FLUTICASONE PROPIONATE 93 MCG/ACT NA EXHU: 2.0000 | INHALANT_SUSPENSION | Freq: Two times a day (BID) | NASAL | 5 refills | Status: DC

## 2019-01-30 NOTE — Assessment & Plan Note (Signed)
No recent evaluation. Patient had tonsillectomy and uvuloplasty. Can't use BiPAP machine due to nasal congestion.  Refer to pulmonology regarding sleep apnea and readjusting settings on his machine.

## 2019-01-30 NOTE — Assessment & Plan Note (Signed)
>>  ASSESSMENT AND PLAN FOR OBSTRUCTIVE SLEEP APNEA SYNDROME WRITTEN ON 01/30/2019 12:11 PM BY Wyline Mood M, DO  No recent evaluation. Patient had tonsillectomy and uvuloplasty. Can't use BiPAP machine due to nasal congestion. Refer to pulmonology regarding sleep apnea and readjusting settings on his machine.

## 2019-01-30 NOTE — Patient Instructions (Addendum)
Today's testing was positive to tree pollen and some molds.  Start Xhance 2 sprays twice a day. Nasal saline spray (i.e., Simply Saline) or nasal saline lavage (i.e., NeilMed) is recommended as needed and prior to medicated nasal sprays.  Get bloodwork  Refer to pulmonology regarding sleep apnea  Follow up in 2 months   Reducing Pollen Exposure . Pollen seasons: trees (spring), grass (summer) and ragweed/weeds (fall). Marland Kitchen. Keep windows closed in your home and car to lower pollen exposure.  Lilian Kapur. Install air conditioning in the bedroom and throughout the house if possible.  . Avoid going out in dry windy days - especially early morning. . Pollen counts are highest between 5 - 10 AM and on dry, hot and windy days.  . Save outside activities for late afternoon or after a heavy rain, when pollen levels are lower.  . Avoid mowing of grass if you have grass pollen allergy. Marland Kitchen. Be aware that pollen can also be transported indoors on people and pets.  . Dry your clothes in an automatic dryer rather than hanging them outside where they might collect pollen.  . Rinse hair and eyes before bedtime. Mold Control . Mold and fungi can grow on a variety of surfaces provided certain temperature and moisture conditions exist.  . Outdoor molds grow on plants, decaying vegetation and soil. The major outdoor mold, Alternaria and Cladosporium, are found in very high numbers during hot and dry conditions. Generally, a late summer - fall peak is seen for common outdoor fungal spores. Rain will temporarily lower outdoor mold spore count, but counts rise rapidly when the rainy period ends. . The most important indoor molds are Aspergillus and Penicillium. Dark, humid and poorly ventilated basements are ideal sites for mold growth. The next most common sites of mold growth are the bathroom and the kitchen. Outdoor (Seasonal) Mold Control . Use air conditioning and keep windows closed. . Avoid exposure to decaying  vegetation. Marland Kitchen. Avoid leaf raking. . Avoid grain handling. . Consider wearing a face mask if working in moldy areas.  Indoor (Perennial) Mold Control  . Maintain humidity below 50%. . Get rid of mold growth on hard surfaces with water, detergent and, if necessary, 5% bleach (do not mix with other cleaners). Then dry the area completely. If mold covers an area more than 10 square feet, consider hiring an indoor environmental professional. . For clothing, washing with soap and water is best. If moldy items cannot be cleaned and dried, throw them away. . Remove sources e.g. contaminated carpets. . Repair and seal leaking roofs or pipes. Using dehumidifiers in damp basements may be helpful, but empty the water and clean units regularly to prevent mildew from forming. All rooms, especially basements, bathrooms and kitchens, require ventilation and cleaning to deter mold and mildew growth. Avoid carpeting on concrete or damp floors, and storing items in damp areas.

## 2019-01-30 NOTE — Progress Notes (Signed)
New Patient Note  RE: [name] [name] MRN: [id number] DOB: 1961/04/27 Date of Office Visit: 01/30/2019  Referring provider: No ref. provider found Primary care provider: Dartha LodgeSteele, Anthony, FNP  Chief Complaint: Allergic Rhinitis  and Sleep Apnea  History of Present Illness: I had the pleasure of seeing [name] [name] at the [facility] on 01/30/2019. He is a 58 y.o. male, who is self-referred here for the [name] of allergies. Patient was see in the early 2000s in our office by Dr. Lucie LeatherKozlow.   He reports symptoms of PND, nasal congestion, minimal sneezing, itchy nose. Symptoms have been going on for 20 years. The symptoms are present all year around with worsening in spring. Anosmia: no. Headache: yes.  He has used zyrtec, Flonase with some improvement in symptoms. Sinus infections: one. Previous work up includes: skin testing in 2006 was positive to dust and pollen per patient's report. He was on allergy injections for about 4-5 weeks and had to stop due to insurance coverage reasons.  Previous ENT [name]: was evaluated by ENT and was diagnosed with a sinus infection which was treated with antibiotics. Previous sinus imaging: no.  Patient was diagnosed with OSA and has a BiPAP machine but having difficulty using it due to nasal congestion. Wake up tired due to poor sleep. No recent [name].  Patient had tonsillectomy and uvuloplasty which helped.  Assessment and Plan: [name] [name] is a 58 y.o. male with: Other allergic rhinitis Perennial rhinitis symptoms for the past 20 years with worsening in the spring. Skin testing in the early 2000s was positive to dust mites and pollen per patient's report. ENT [name] showed sinus infection which was treated with antibiotics.   Today's testing was positive to tree pollen and some molds.  Get bloodwork as below to see if there are any additional allergens present.  Discussed environmental control  measures.   Start Xhance 2 sprays twice a day. Demonstrated proper use and sample given.   Nasal saline spray (i.e., Simply Saline) or nasal saline lavage (i.e., NeilMed) is recommended as needed and prior to medicated nasal sprays.  Obstructive sleep apnea syndrome No recent [name]. Patient had tonsillectomy and uvuloplasty. Can't use BiPAP machine due to nasal congestion.  Refer to pulmonology regarding sleep apnea and readjusting settings on his machine.   Return in about 2 months (around 03/31/2019).  Meds ordered this encounter  Medications  . Fluticasone Propionate (XHANCE) 93 MCG/ACT EXHU    Sig: Place 2 sprays into the nose 2 (two) times daily.    Dispense:  32 mL    Refill:  5    [phone] (H)    Lab Orders     Allergens, Zone 2  Other allergy screening: Asthma: no Rhino conjunctivitis: yes Food allergy: no Medication allergy: yes  Pravastatin - joint pains.  Hymenoptera allergy: no Urticaria: no Eczema:no History of recurrent infections suggestive of immunodeficency: no  Diagnostics: Skin Testing: Environmental allergy panel. Positive test to: tree pollen and few molds. Results discussed with patient/family. Airborne Adult Perc - 01/30/19 1020    Time Antigen Placed  1020    Allergen Manufacturer  Greer    Location  Back    Number of Test  59    Panel 1  Select    1. Control-Buffer 50% Glycerol  Negative    2. Control-Histamine 1 mg/ml  2+    3. Albumin saline  Negative    4. Bahia  Negative    5. French Southern Territories[place]ermuda  Negative    6. Johnson  Negative    7. [place] Blue  Negative    8. Meadow Fescue  Negative    9. Perennial Rye  Negative    10. Sweet Vernal  Negative    11. [name]  Negative    12. Cocklebur  Negative    13. Burweed Marshelder  Negative    14. Ragweed, short  Negative    15. Ragweed, Giant  Negative    16. Plantain,  English  Negative    17. Lamb's Quarters  Negative    18. Sheep Sorrell  Negative    19. Rough Pigweed  Negative     20. Marsh Elder, Rough  Negative    21. Mugwort, Common  Negative    22. Ash mix  Negative    23. Birch mix  Negative    24. Beech American  Negative    25. Box, Elder  Negative    26. Cedar, red  Negative    27. Cottonwood, Guinea-Bissau  Negative    28. Elm mix  Negative    29. Hickory mix  Negative    30. Maple mix  Negative    31. Oak, Guinea-Bissau mix  Negative    32. Pecan Pollen  Negative    33. Pine mix  Negative    34. Sycamore Eastern  Negative    35. Walnut, Black Pollen  Negative    36. Alternaria alternata  Negative    37. Cladosporium Herbarum  Negative    38. Aspergillus mix  Negative    39. Penicillium mix  Negative    40. Bipolaris sorokiniana (Helminthosporium)  Negative    41. Drechslera spicifera (Curvularia)  Negative    42. Mucor plumbeus  Negative    43. Fusarium moniliforme  Negative    44. Aureobasidium pullulans (pullulara)  Negative    45. Rhizopus oryzae  Negative    46. Botrytis cinera  Negative    47. Epicoccum nigrum  Negative    48. Phoma betae  Negative    49. Candida Albicans  Negative    50. Trichophyton mentagrophytes  Negative    51. Mite, D Farinae  5,000 AU/ml  Negative    52. Mite, D Pteronyssinus  5,000 AU/ml  Negative    53. Cat Hair 10,000 BAU/ml  Negative    54.  Dog Epithelia  Negative    55. Mixed Feathers  Negative    56. Horse Epithelia  Negative    57. Cockroach, German  Negative    58. Mouse  Negative    59. Tobacco Leaf  Negative     Intradermal - 01/30/19 1107    Time Antigen Placed  1100    Allergen Manufacturer  Waynette Buttery    Location  Arm    Number of Test  15    Intradermal  Select    Control  Negative    French Southern Territ[place]  Negative    Johnson  Negative    7 Grass  Negative    Ragweed mix  Negative    Weed mix  Negative    Tree mix  3+    Mold 1  Negative    Mold 2  2+    Mold 3  Negative    Mold 4  Negative    Cat  Negative    Dog  Negative    Cockroach  Negative    Mite mix  Negative       Past Medical History: Patient  Active Problem List   Diagnosis Date Noted  . Obstructive sleep apnea syndrome 01/30/2019  . Other allergic rhinitis 01/30/2019  . 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 Thoracic or lumbosacral neuritis or radiculitis, unspecified 06/24/2012  . Degeneration of lumbar or lumbosacral intervertebral disc 06/24/2012   Past Medical History:  Diagnosis Date  . Anxiety   . Depression   . Diabetes mellitus    Type 2  . Enlarged prostate   . Environmental allergies   . Environmental and seasonal allergies   . GERD (gastroesophageal reflux disease)   . Headache(784.0)    sinus and migraines  . Hypertension   . IBS (irritable bowel syndrome)   . Sleep apnea    BiPAP   Past Surgical History: Past Surgical History:  Procedure Laterality Date  . KNEE ARTHROSCOPY Left   . SACROILIAC JOINT FUSION Right 05/02/2014   Procedure: SACROILIAC JOINT FUSION;  Surgeon: Emilee HeroMark Leonard Dumonski, MD;  [facility];  Service: Orthopedics;  Laterality: Right;  Right sided sacroiliac joint fusion  . TONSILLECTOMY     uvula removed   Medication List:  Current Outpatient Medications  Medication Sig Dispense Refill  . atorvastatin (LIPITOR) 20 MG tablet Take 20 mg by mouth at bedtime.    Marland Kitchen. buPROPion (WELLBUTRIN SR) 200 MG 12 hr tablet Take 200 mg by mouth 2 (two) times daily.    . cloNIDine (CATAPRES) 0.1 MG tablet Take 0.2 mg by mouth 2 (two) times daily.    . cloNIDine (CATAPRES) 0.2 MG tablet Take 0.2 mg by mouth 2 (two) times daily.    . CYMBALTA 30 MG capsule Take 30 mg by mouth 2 (two) times daily.    . hydrALAZINE (APRESOLINE) 25 MG tablet Take 25 mg by mouth 3 (three) times daily.    . hydrochlorothiazide (HYDRODIURIL) 25 MG tablet Take 25 mg by mouth daily.    . insulin glargine (LANTUS) 100 UNIT/ML injection Inject 65 Units into the skin daily.    Marland Kitchen. lisinopril (PRINIVIL,ZESTRIL) 40 MG tablet Take 40 mg by mouth daily.    . tamsulosin (FLOMAX) 0.4 MG CAPS capsule Take 0.4 mg by mouth daily.     Marland Kitchen. triamcinolone cream (KENALOG) 0.1 % Apply 1 application topically 2 (two) times daily. 30 g 1  . verapamil (CALAN) 120 MG tablet Take by mouth.    . cetirizine (ZYRTEC) 10 MG tablet Take 10 mg by mouth daily.    . Fluticasone Propionate (XHANCE) 93 MCG/ACT EXHU Place 2 sprays into the nose 2 (two) times daily. 32 mL 5   No current facility-administered medications for this visit.    Allergies: Allergies  Allergen Reactions  . Pravastatin Other (See Comments)    Joint myalgias   Social History: Social History   Socioeconomic History  . Marital status: Single    Spouse name: Not on file  . Number of children: Not on file  . Years of education: Not on file  . Highest education level: Not on file  Occupational History  . Not on file  Social Needs  . Financial resource strain: Not on file  . Food insecurity:    Worry: Not on file    Inability: Not on file  . Transportation needs:    Medical: Not on file    Non-medical: Not on file  Tobacco Use  . Smoking status: Former Smoker    Packs/day: 1.00    Years: 10.00    Pack years: 10.00    Last attempt to quit: 04/20/1992  Years since quitting: 26.7  . Smokeless tobacco: Never Used  Substance and Sexual Activity  . Alcohol use: No  . Drug use: No  . Sexual activity: Not on file  Lifestyle  . Physical activity:    Days per week: Not on file    Minutes per session: Not on file  . Stress: Not on file  Relationships  . Social connections:    Talks on phone: Not on file    Gets together: Not on file    Attends religious service: Not on file    Active member of club or organization: Not on file    Attends meetings of clubs or organizations: Not on file    Relationship status: Not on file  Other Topics Concern  . Not on file  Social History Narrative  . Not on file   Lives in a 55 year old house. Smoking: denies Occupation: reservationist  Environmental HistorySurveyor, minerals in the house: yes Carpet in  the family room: no Carpet in the bedroom: no Heating: electric Cooling: central Pet: yes 1 dog x 1.5 yrs sometimes goes into bedroom.   Family History: History reviewed. No pertinent family history.  Problem                               Relation Asthma                                   No  Eczema                                No  Food allergy                          No  Allergic rhino conjunctivitis     No   Review of Systems  Constitutional: Negative for appetite change, chills, fever and unexpected weight change.  HENT: Positive for congestion and postnasal drip. Negative for rhinorrhea.   Eyes: Negative for itching.  Respiratory: Negative for cough, chest tightness, shortness of breath and wheezing.   Cardiovascular: Negative for chest pain.  Gastrointestinal: Negative for abdominal pain.  Genitourinary: Negative for difficulty urinating.  Skin: Negative for rash.  Allergic/Immunologic: Positive for environmental allergies. Negative for food allergies.  Neurological: Negative for headaches.   Objective: BP 116/60   Pulse 80   Resp 16   Ht 5\' 10"  (1.778 m)   Wt 229 lb (103.9 kg)   SpO2 92%   BMI 32.86 kg/m  Body mass index is 32.86 kg/m. Physical Exam  Constitutional: He is oriented to person, place, and time. He appears well-developed and well-nourished.  HENT:  Head: Normocephalic and atraumatic.  Right Ear: External ear normal.  Left Ear: External ear normal.  Nose: Nose normal.  Mouth/Throat: Oropharynx is clear and moist.  Eyes: Conjunctivae and EOM are normal.  Neck: Neck supple.  Cardiovascular: Normal rate, regular rhythm and normal heart sounds. Exam reveals no gallop and no friction rub.  No murmur heard. Pulmonary/Chest: Effort normal and breath sounds normal. He has no wheezes. He has no rales.  Abdominal: Soft.  Lymphadenopathy:    He has no cervical adenopathy.  Neurological: He is alert and oriented to person, place, and time.  Skin: Skin is  warm. No rash  noted.  Psychiatric: He has a normal mood and affect. His behavior is normal.  Nursing note and vitals reviewed.  The plan was reviewed with the patient/family, and all questions/concerned were addressed.  It was my pleasure to see [name] [name] today and participate in his care. Please feel free to contact me with any questions or concerns.  Sincerely,  Wyline Mood, DO Allergy & Immunology  [facility] office: [phone] [facility] office: [phone]

## 2019-01-30 NOTE — Assessment & Plan Note (Signed)
Perennial rhinitis symptoms for the past 20 years with worsening in the spring. Skin testing in the early 2000s was positive to dust mites and pollen per patient's report. ENT evaluation showed sinus infection which was treated with antibiotics.   Today's testing was positive to tree pollen and some molds.  Get bloodwork as below to see if there are any additional allergens present.  Discussed environmental control measures.   Start Xhance 2 sprays twice a day. Demonstrated proper use and sample given.   Nasal saline spray (i.e., Simply Saline) or nasal saline lavage (i.e., NeilMed) is recommended as needed and prior to medicated nasal sprays.

## 2019-02-01 LAB — ALLERGENS, ZONE 2

## 2019-02-02 ENCOUNTER — Other Ambulatory Visit: Payer: Self-pay

## 2019-02-02 MED ORDER — FLUTICASONE PROPIONATE 93 MCG/ACT NA EXHU: 2.0000 | INHALANT_SUSPENSION | Freq: Two times a day (BID) | NASAL | 5 refills | Status: DC

## 2019-02-10 ENCOUNTER — Ambulatory Visit: Payer: Commercial Managed Care - PPO | Admitting: Family Medicine

## 2019-02-10 ENCOUNTER — Encounter: Payer: Self-pay | Admitting: Family Medicine

## 2019-02-10 ENCOUNTER — Encounter: Payer: Self-pay | Admitting: *Deleted

## 2019-02-10 ENCOUNTER — Telehealth: Payer: Self-pay

## 2019-02-10 ENCOUNTER — Telehealth: Payer: Self-pay | Admitting: Allergy

## 2019-02-10 VITALS — BP 140/80 | HR 107 | Resp 16

## 2019-02-10 DIAGNOSIS — G4733 Obstructive sleep apnea (adult) (pediatric): Secondary | ICD-10-CM

## 2019-02-10 DIAGNOSIS — J3089 Other allergic rhinitis: Secondary | ICD-10-CM

## 2019-02-10 DIAGNOSIS — H6992 Unspecified Eustachian tube disorder, left ear: Secondary | ICD-10-CM | POA: Insufficient documentation

## 2019-02-10 DIAGNOSIS — H6982 Other specified disorders of Eustachian tube, left ear: Secondary | ICD-10-CM | POA: Insufficient documentation

## 2019-02-10 MED ORDER — FLUTICASONE PROPIONATE 93 MCG/ACT NA EXHU: 2.0000 | INHALANT_SUSPENSION | Freq: Two times a day (BID) | NASAL | 5 refills | Status: DC

## 2019-02-10 MED ORDER — BUDESONIDE 0.5 MG/2ML IN SUSP: RESPIRATORY_TRACT | 3 refills | Status: DC

## 2019-02-10 NOTE — Telephone Encounter (Signed)
Timmothy Sours was sent to [facility] and should of gone to [facility]. Disregard script to [facility] and sent to [facility].

## 2019-02-10 NOTE — Patient Instructions (Addendum)
  Eustachian tube dysfunction Stop Xhance for now. Begin budesonide/saline rinses twice a day Continue antihistamine with decongestant for now. Continue to check your blood pressure Follow up with Dr. Haroldine Laws if your symptoms worsen  Allergic rhinitis Continue saline rinses as needed Continue to avoid tree pollen and molds Information regarding allergen immunotherapy given   Obstructive sleep apnea Follow up with pulmonology regarding sleep apnea and readjusting settings on the machine  Follow up in 2 weeks or sooner

## 2019-02-10 NOTE — Telephone Encounter (Signed)
Information discussed with pt. Pt states that he is frustrated that nothing is working and he wants to know what is going on with his ears. Advised pt to try antihistamine with decongestant over the weekend, monitoring his blood pressure and made him an appointment to see Dr. Selena Batten on 02/13/19.  Pt verbalized understanding.

## 2019-02-10 NOTE — Telephone Encounter (Signed)
Patient saw Dr. Selena Batten on 2-3 and said he has fluid in his ears and does not feel well. I offered him to come in or speak with a nurse, he chose to speak to a nurse to see if there was anything that could be done without an office visit.

## 2019-02-10 NOTE — Progress Notes (Signed)
[address] [place] [address] Dept: [phone]  FOLLOW UP NOTE  Patient ID: [name] [name], male    DOB: 1961-08-14  Age: 58 y.o. MRN: [id number] Date of Office Visit: 02/10/2019  Assessment  Chief Complaint: Ear Problem and Dizziness  HPI [name] [name] is a 58 year old male who presents to the clinic for a follow up viait. He was last seen in the clinic on 01/30/2019 by Dr. Selena Batten for evaluation of allergic rhinitis and sleep apnea. At that time, his skin testing was positive to tree pollen and molds. His labs to environmental allergens were negative. At today's visit, he reports that he used Xhance and doing sinus rinses for about a week with relief of sinus pressure and a slight decrease of sinus drainage. Wednesday, he describes a feeling like his "head was full of fluid" and a feeling of water in his left ear. He did not use Xhance yesterday and instead took [employer] two times. At today's visit, he reports both ears feel "like they are full of water" with the left more than the right. He reports that yesterday he began to experience muffled hearing and feeling off balance when moving and just after he stops moving. He denies fever and sick contacts. He is followed by Dr Haroldine Laws, ENT, with his last appointment about 1 month ago. He is expressing an interest in receiving allergen immunotherapy.    Drug Allergies:  Allergies  Allergen Reactions  . Pravastatin Other (See Comments)    Joint myalgias    Physical Exam: BP 140/80 (BP Location: Left Arm, Patient Position: Sitting, Cuff Size: Normal)   Pulse (!) 107   Resp 16   SpO2 92%    Physical Exam Vitals signs reviewed.  Constitutional:      Appearance: Normal appearance.  HENT:     Head: Normocephalic and atraumatic.     Nose:     Comments: Bilateral nares slightly erythematous with clear nasal drainage noted. Pharynx slightly erythematous with no exudate. Right ear normal. Left ear full retraction. Possible debris  in left ear canal. Eyes normal. Eyes:     Conjunctiva/sclera: Conjunctivae normal.  Neck:     Musculoskeletal: Normal range of motion and neck supple.  Cardiovascular:     Rate and Rhythm: Normal rate and regular rhythm.     Heart sounds: Normal heart sounds. No murmur.  Pulmonary:     Effort: Pulmonary effort is normal.     Breath sounds: Normal breath sounds.     Comments: Lungs clear to auscultation Musculoskeletal: Normal range of motion.  Skin:    General: Skin is warm and dry.  Neurological:     Mental Status: He is alert and oriented to person, place, and time.  Psychiatric:        Mood and Affect: Mood normal.        Behavior: Behavior normal.        Thought Content: Thought content normal.        Judgment: Judgment normal.       Assessment and Plan: 1. Other allergic rhinitis   2. Obstructive sleep apnea syndrome   3. Eustachian tube dysfunction, left     Meds ordered this encounter  Medications  . budesonide (PULMICORT) 0.5 MG/2ML nebulizer solution    Sig: Budesonide + Saline Irrigation/Rinse. Make 240cc of Saline in the NeilMed bottle using the salt packets. Then add the entire 2cc vial of Budesonide to the rinse bottle and mix together. Use twice a day.  Dispense:  60 mL    Refill:  3    Patient Instructions   Eustachian tube dysfunction Stop Xhance for now. Begin budesonide/saline rinses twice a day Continue antihistamine with decongestant for now. Continue to check your blood pressure Follow up with Dr. Haroldine Laws if your symptoms worsen  Allergic rhinitis Continue saline rinses as needed Continue to avoid tree pollen and molds Information regarding allergen immunotherapy given   Obstructive sleep apnea Follow up with pulmonology regarding sleep apnea and readjusting settings on the machine  Follow up in 2 weeks or sooner    Return in about 2 weeks (around 02/24/2019).    Thank you for the opportunity to care for this patient.  Please do not  hesitate to contact me with questions.  Thermon Leyland, FNP [facility]

## 2019-02-10 NOTE — Telephone Encounter (Signed)
Dr. Selena Batten, please advise if there is anything we can give him to help?  Zyrtec D?

## 2019-02-10 NOTE — Telephone Encounter (Signed)
Molli Knock may try any antihistamine with a decongestant such as zyrtec D, allegra D or claritin D. Monitor blood pressure.  If not better or worsening then we will need to look in the ear make sure there is nothing else going on.

## 2019-02-13 ENCOUNTER — Ambulatory Visit: Payer: Commercial Managed Care - PPO | Admitting: Allergy

## 2019-02-13 ENCOUNTER — Telehealth: Payer: Self-pay

## 2019-02-13 NOTE — Telephone Encounter (Signed)
Patient called stated that he did not want to take xhance any more due to side effects. Patient was advise it will be D/C and pharmacy will be contacted to D/C it.

## 2019-03-17 ENCOUNTER — Telehealth: Payer: Self-pay | Admitting: Neurology

## 2019-03-17 NOTE — Telephone Encounter (Signed)
Called the patient and advised that our office will be closing on Monday per our upper management. Advised the patient I will be in contact with him on Monday to get him rescheduled once I have further guidance. Pt verbalized understanding.

## 2019-03-20 ENCOUNTER — Other Ambulatory Visit: Payer: Self-pay

## 2019-03-20 ENCOUNTER — Encounter: Payer: Self-pay | Admitting: Medical

## 2019-03-20 ENCOUNTER — Telehealth: Payer: Self-pay | Admitting: Neurology

## 2019-03-20 ENCOUNTER — Ambulatory Visit: Payer: Commercial Managed Care - PPO | Admitting: Medical

## 2019-03-20 ENCOUNTER — Institutional Professional Consult (permissible substitution): Payer: Commercial Managed Care - PPO | Admitting: Neurology

## 2019-03-20 VITALS — BP 140/88 | HR 85 | Temp 97.7°F | Ht 71.0 in | Wt 223.4 lb

## 2019-03-20 DIAGNOSIS — Z113 Encounter for screening for infections with a predominantly sexual mode of transmission: Secondary | ICD-10-CM

## 2019-03-20 DIAGNOSIS — Z7185 Encounter for immunization safety counseling: Secondary | ICD-10-CM | POA: Insufficient documentation

## 2019-03-20 DIAGNOSIS — E1165 Type 2 diabetes mellitus with hyperglycemia: Secondary | ICD-10-CM | POA: Insufficient documentation

## 2019-03-20 DIAGNOSIS — E785 Hyperlipidemia, unspecified: Secondary | ICD-10-CM | POA: Insufficient documentation

## 2019-03-20 DIAGNOSIS — I10 Essential (primary) hypertension: Secondary | ICD-10-CM

## 2019-03-20 DIAGNOSIS — J3089 Other allergic rhinitis: Secondary | ICD-10-CM

## 2019-03-20 DIAGNOSIS — Z125 Encounter for screening for malignant neoplasm of prostate: Secondary | ICD-10-CM

## 2019-03-20 DIAGNOSIS — Z808 Family history of malignant neoplasm of other organs or systems: Secondary | ICD-10-CM | POA: Insufficient documentation

## 2019-03-20 DIAGNOSIS — E1110 Type 2 diabetes mellitus with ketoacidosis without coma: Secondary | ICD-10-CM | POA: Insufficient documentation

## 2019-03-20 DIAGNOSIS — Z0000 Encounter for general adult medical examination without abnormal findings: Secondary | ICD-10-CM | POA: Diagnosis not present

## 2019-03-20 DIAGNOSIS — I1A0 Resistant hypertension: Secondary | ICD-10-CM | POA: Insufficient documentation

## 2019-03-20 DIAGNOSIS — G4733 Obstructive sleep apnea (adult) (pediatric): Secondary | ICD-10-CM

## 2019-03-20 DIAGNOSIS — J329 Chronic sinusitis, unspecified: Secondary | ICD-10-CM

## 2019-03-20 DIAGNOSIS — J309 Allergic rhinitis, unspecified: Secondary | ICD-10-CM | POA: Insufficient documentation

## 2019-03-20 DIAGNOSIS — E1159 Type 2 diabetes mellitus with other circulatory complications: Secondary | ICD-10-CM | POA: Insufficient documentation

## 2019-03-20 DIAGNOSIS — E1169 Type 2 diabetes mellitus with other specified complication: Secondary | ICD-10-CM | POA: Insufficient documentation

## 2019-03-20 DIAGNOSIS — Z7189 Other specified counseling: Secondary | ICD-10-CM

## 2019-03-20 DIAGNOSIS — N489 Disorder of penis, unspecified: Secondary | ICD-10-CM

## 2019-03-20 DIAGNOSIS — Z8249 Family history of ischemic heart disease and other diseases of the circulatory system: Secondary | ICD-10-CM | POA: Insufficient documentation

## 2019-03-20 DIAGNOSIS — E119 Type 2 diabetes mellitus without complications: Secondary | ICD-10-CM | POA: Insufficient documentation

## 2019-03-20 DIAGNOSIS — R270 Ataxia, unspecified: Secondary | ICD-10-CM

## 2019-03-20 DIAGNOSIS — R7989 Other specified abnormal findings of blood chemistry: Secondary | ICD-10-CM

## 2019-03-20 DIAGNOSIS — E669 Obesity, unspecified: Secondary | ICD-10-CM

## 2019-03-20 DIAGNOSIS — Z794 Long term (current) use of insulin: Secondary | ICD-10-CM

## 2019-03-20 LAB — POCT URINALYSIS DIP (PROADVANTAGE DEVICE)
Bilirubin, UA: NEGATIVE
Blood, UA: NEGATIVE
Glucose, UA: 100 mg/dL — AB
Ketones, POC UA: NEGATIVE mg/dL
Leukocytes, UA: NEGATIVE
Nitrite, UA: NEGATIVE
Protein Ur, POC: NEGATIVE mg/dL
Specific Gravity, Urine: 1.03
Urobilinogen, Ur: NEGATIVE
pH, UA: 5.5 (ref 5.0–8.0)

## 2019-03-20 NOTE — Patient Instructions (Signed)
Thanks for trusting [place] with your health care and for coming in for a physical today.  Below are some general recommendations I have for you:  Yearly screenings See your eye doctor yearly for routine vision care. See your dentist yearly for routine dental care including hygiene visits twice yearly. See me here yearly for a routine physical and preventative care visit   Specific Concerns today:  . Please call your insurance to see if they cover the following vaccines as you are due for these.  If they cover these and set up an appointment to come in for vaccination . Vaccines due include: Pneumococcal 23 vaccine, Shingrix, Tdap . We will refer you to urology about the skin lesion and low testosterone . Follow-up with neurology as planned regarding sleep study . We will call back with other recommendations and lab results   Please follow up yearly for a physical.   Preventative Care for Adults - Male      MAINTAIN REGULAR HEALTH EXAMS:  A routine yearly physical is a good way to check in with your primary care provider about your health and preventive screening. It is also an opportunity to share updates about your health and any concerns you have, and receive a thorough all-over exam.   Most health insurance companies pay for at least some preventative services.  Check with your health plan for specific coverages.  WHAT PREVENTATIVE SERVICES DO MEN NEED?  Adult men should have their weight and blood pressure checked regularly.   Men age 74 and older should have their cholesterol levels checked regularly.  Beginning at age 63 and continuing to age 13, men should be screened for colorectal cancer.  Certain people may need continued testing until age 78.  Updating vaccinations is part of preventative care.  Vaccinations help protect against diseases such as the flu.  Osteoporosis is a disease in which the bones lose minerals and strength as we age. Men ages 63 and over should  discuss this with their caregivers  Lab tests are generally done as part of preventative care to screen for anemia and blood disorders, to screen for problems with the kidneys and liver, to screen for bladder problems, to check blood sugar, and to check your cholesterol level.  Preventative services generally include counseling about diet, exercise, avoiding tobacco, drugs, excessive alcohol consumption, and sexually transmitted infections.    GENERAL RECOMMENDATIONS FOR GOOD HEALTH:  Healthy diet:  Eat a variety of foods, including fruit, vegetables, animal or vegetable protein, such as meat, fish, chicken, and eggs, or beans, lentils, tofu, and grains, such as rice.  Drink plenty of water daily.  Decrease saturated fat in the diet, avoid lots of red meat, processed foods, sweets, fast foods, and fried foods.  Exercise:  Aerobic exercise helps maintain good heart health. At least 30-40 minutes of moderate-intensity exercise is recommended. For example, a brisk walk that increases your heart rate and breathing. This should be done on most days of the week.   Find a type of exercise or a variety of exercises that you enjoy so that it becomes a part of your daily life.  Examples are running, walking, swimming, water aerobics, and biking.  For motivation and support, explore group exercise such as aerobic class, spin class, Zumba, Yoga,or  martial arts, etc.    Set exercise goals for yourself, such as a certain weight goal, walk or run in a race such as a 5k walk/run.  Speak to your primary care provider  about exercise goals.  Disease prevention:  If you smoke or chew tobacco, find out from your caregiver how to quit. It can literally save your life, no matter how long you have been a tobacco user. If you do not use tobacco, never begin.   Maintain a healthy diet and normal weight. Increased weight leads to problems with blood pressure and diabetes.   The Body Mass Index or BMI is a way of  measuring how much of your body is fat. Having a BMI above 27 increases the risk of heart disease, diabetes, hypertension, stroke and other problems related to obesity. Your caregiver can help determine your BMI and based on it develop an exercise and dietary program to help you achieve or maintain this important measurement at a healthful level.  High blood pressure causes heart and blood vessel problems.  Persistent high blood pressure should be treated with medicine if weight loss and exercise do not work.   Fat and cholesterol leaves deposits in your arteries that can block them. This causes heart disease and vessel disease elsewhere in your body.  If your cholesterol is found to be high, or if you have heart disease or certain other medical conditions, then you may need to have your cholesterol monitored frequently and be treated with medication.   Ask if you should have a cardiac stress test if your history suggests this. A stress test is a test done on a treadmill that looks for heart disease. This test can find disease prior to there being a problem.  Osteoporosis is a disease in which the bones lose minerals and strength as we age. This can result in serious bone fractures. Risk of osteoporosis can be identified using a bone density scan. Men ages 21 and over should discuss this with their caregivers. Ask your caregiver whether you should be taking a calcium supplement and Vitamin D, to reduce the rate of osteoporosis.   Avoid drinking alcohol in excess (more than two drinks per day).  Avoid use of street drugs. Do not share needles with anyone. Ask for professional help if you need assistance or instructions on stopping the use of alcohol, cigarettes, and/or drugs.  Brush your teeth twice a day with fluoride toothpaste, and floss once a day. Good oral hygiene prevents tooth decay and gum disease. The problems can be painful, unattractive, and can cause other health problems. Visit your dentist  for a routine oral and dental check up and preventive care every 6-12 months.   Look at your skin regularly.  Use a mirror to look at your back. Notify your caregivers of changes in moles, especially if there are changes in shapes, colors, a size larger than a pencil eraser, an irregular border, or development of new moles.  Safety:  Use seatbelts 100% of the time, whether driving or as a passenger.  Use safety devices such as hearing protection if you work in environments with loud noise or significant background noise.  Use safety glasses when doing any work that could send debris in to the eyes.  Use a helmet if you ride a bike or motorcycle.  Use appropriate safety gear for contact sports.  Talk to your caregiver about gun safety.  Use sunscreen with a SPF (or skin protection factor) of 15 or greater.  Lighter skinned people are at a greater risk of skin cancer. Don't forget to also wear sunglasses in order to protect your eyes from too much damaging sunlight. Damaging sunlight can accelerate cataract  formation.   Practice safe sex. Use condoms. Condoms are used for birth control and to help reduce the spread of sexually transmitted infections (or STIs).  Some of the STIs are gonorrhea (the clap), chlamydia, syphilis, trichomonas, herpes, HPV (human papilloma virus) and HIV (human immunodeficiency virus) which causes AIDS. The herpes, HIV and HPV are viral illnesses that have no cure. These can result in disability, cancer and death.   Keep carbon monoxide and smoke detectors in your home functioning at all times. Change the batteries every 6 months or use a model that plugs into the wall.   Vaccinations:  Stay up to date with your tetanus shots and other required immunizations. You should have a booster for tetanus every 10 years. Be sure to get your flu shot every year, since 5%-20% of the [place] population comes down with the flu. The flu vaccine changes each year, so being vaccinated once is  not enough. Get your shot in the fall, before the flu season peaks.   Other vaccines to consider:  Human Papilloma Virus or HPV causes cancer of the cervix, and other infections that can be transmitted from person to person. There is a vaccine for HPV, and males should get immunized between the ages of 53 and 36. It requires a series of 3 shots.   Pneumococcal vaccine to protect against certain types of pneumonia.  This is normally recommended for adults age 47 or older.  However, adults younger than 56 years old with certain underlying conditions such as diabetes, heart or lung disease should also receive the vaccine.  Shingles vaccine to protect against Varicella Zoster if you are older than age 80, or younger than 56 years old with certain underlying illness.  If you have not had the Shingrix vaccine, please call your insurer to inquire about coverage for the Shingrix vaccine given in 2 doses.   Some insurers cover this vaccine after age 38, some cover this after age 31.  If your insurer covers this, then call to schedule appointment to have this vaccine here  Hepatitis A vaccine to protect against a form of infection of the liver by a virus acquired from food.  Hepatitis B vaccine to protect against a form of infection of the liver by a virus acquired from blood or body fluids, particularly if you work in health care.  If you plan to travel internationally, check with your local health department for specific vaccination recommendations.   What should I know about cancer screening? Many types of cancers can be detected early and may often be prevented. Lung Cancer  You should be screened every year for lung cancer if: ? You are a current smoker who has smoked for at least 30 years. ? You are a former smoker who has quit within the past 15 years.  Talk to your health care provider about your screening options, when you should start screening, and how often you should be  screened.  Colorectal Cancer  Routine colorectal cancer screening usually begins at 56 years of age and should be repeated every 5-10 years until you are 56 years old. You may need to be screened more often if early forms of precancerous polyps or small growths are found. Your health care provider may recommend screening at an earlier age if you have risk factors for colon cancer.  Your health care provider may recommend using home test kits to check for hidden blood in the stool.  A small camera at the end of  a tube can be used to examine your colon (sigmoidoscopy or colonoscopy). This checks for the earliest forms of colorectal cancer.  Prostate and Testicular Cancer  Depending on your age and overall health, your health care provider may do certain tests to screen for prostate and testicular cancer.  Talk to your health care provider about any symptoms or concerns you have about testicular or prostate cancer.  Skin Cancer  Check your skin from head to toe regularly.  Tell your health care provider about any new moles or changes in moles, especially if: ? There is a change in a mole's size, shape, or color. ? You have a mole that is larger than a pencil eraser.  Always use sunscreen. Apply sunscreen liberally and repeat throughout the day.  Protect yourself by wearing long sleeves, pants, a wide-brimmed hat, and sunglasses when outside.

## 2019-03-20 NOTE — Telephone Encounter (Signed)
Pt returned Rn's call °

## 2019-03-20 NOTE — Progress Notes (Signed)
Subjective:   HPI  [name] [name] is a 58 y.o. male who presents for Chief Complaint  Patient presents with  . Annual Exam    new patient    Patient Care Team: Dasha Kawabata, Kermit Balo, PA-C as PCP - General (Family Medicine) Prior with  Thermon Leyland, FNP and Dr. Wyline Mood, with allergy and asthma Dr. Ovid Curd, podiatry Was seeing PCP Dr. Dartha Lodge, [facility] [place]  Concerns: He formally was seen in [facility] [place] for primary care mental health related treatment.  Diabetes-he recently was rationing his Lantus.  Supposed to be using 70 units nightly but only using 40 in the past few weeks.  In the past he is also used Glucotrol, metformin and Janumet.  Checking sugars some of the time.  Hypertension- CMA went over his medications but when I asked him about some of his medicines he is not exactly clear on dosing in which tablets he is actually taking.  Current list of medicines include verapamil, lisinopril, hydrochlorothiazide, hydralazine and clonidine.  From what he is saying he is taking hydralazine or clonidine he is taking 2 tablets 3 times a day.  No prior cardiology evaluation.  He does note having an EKG this past year.  Hyperlipidemia-taking Lipitor 20 mg daily  BPH-taking Flomax daily  Prior pharmacies include [facility].  Works at [employer], take reservation for [employer], lives alone, no significant other.  In the past several months he has had problems with ataxia, when walking can veer one direction.  Has appointment to see [facility] neurology on April 2020  The back of his left leg has an itchy spot for weeks.  No obvious rash.  Reviewed their medical, surgical, family, social, medication, and allergy history and updated chart as appropriate.  Past Medical History:  Diagnosis Date  . Anxiety   . Depression   . Diabetes mellitus    Type 2  . Edema   . Enlarged prostate    . Environmental allergies   . Environmental and seasonal allergies   . GERD (gastroesophageal reflux disease)   . Headache(784.0)    sinus and migraines  . Hyperlipidemia   . Hypertension   . IBS (irritable bowel syndrome)   . Low testosterone   . Sleep apnea    has used BiPAP in past; last sleep study >10 years    Past Surgical History:  Procedure Laterality Date  . COLONOSCOPY  09/2018   normal, repeat in 10 years per patient.  [place], [place]  . KNEE ARTHROSCOPY Left   . SACROILIAC JOINT FUSION Right 05/02/2014   Procedure: SACROILIAC JOINT FUSION;  Surgeon: Emilee Hero, MD;  Location: [facility] OR;  Service: Orthopedics;  Laterality: Right;  Right sided sacroiliac joint fusion  . TONSILLECTOMY     uvula removed    Social History   Socioeconomic History  . Marital status: Single    Spouse name: Not on file  . Number of children: Not on file  . Years of education: Not on file  . Highest education level: Not on file  Occupational History  . Not on file  Social Needs  . Financial resource strain: Not on file  . Food insecurity:    Worry: Not on file    Inability: Not on file  . Transportation needs:    Medical: Not on file    Non-medical: Not on file  Tobacco Use  . Smoking status: Former Smoker  Packs/day: 1.00    Years: 10.00    Pack years: 10.00    Last attempt to quit: 04/20/1992    Years since quitting: 26.9  . Smokeless tobacco: Never Used  Substance and Sexual Activity  . Alcohol use: No  . Drug use: No  . Sexual activity: Not on file  Lifestyle  . Physical activity:    Days per week: 0 days    Minutes per session: Not on file  . Stress: Not on file  Relationships  . Social connections:    Talks on phone: Not on file    Gets together: Not on file    Attends religious service: Not on file    Active member of club or organization: Not on file    Attends meetings of clubs or organizations: Not on file    Relationship status: Not on file  .  Intimate partner violence:    Fear of current or ex partner: Not on file    Emotionally abused: Not on file    Physically abused: Not on file    Forced sexual activity: Not on file  Other Topics Concern  . Not on file  Social History Narrative  . Not on file    Family History  Problem Relation Age of Onset  . Cancer Mother        lung  . Cancer Father        prostate, mets to bone, lung  . Cancer Brother        melanoma  . Heart disease Maternal Grandmother        MI  . Lung disease Maternal Grandfather        black lung  . Heart disease Paternal Grandmother   . Stroke Paternal Grandmother   . Heart disease Paternal Grandfather   . Heart disease Brother 66       MI  . Diabetes Brother      Current Outpatient Medications:  .  atorvastatin (LIPITOR) 20 MG tablet, Take 20 mg by mouth at bedtime., Disp: , Rfl:  .  cloNIDine (CATAPRES) 0.2 MG tablet, Take 0.2 mg by mouth 2 (two) times daily., Disp: , Rfl:  .  hydrALAZINE (APRESOLINE) 25 MG tablet, Take 50 mg by mouth 2 (two) times daily. , Disp: , Rfl:  .  hydrochlorothiazide (HYDRODIURIL) 25 MG tablet, Take 25 mg by mouth daily., Disp: , Rfl:  .  insulin glargine (LANTUS) 100 UNIT/ML injection, Inject 65 Units into the skin daily., Disp: , Rfl:  .  lisinopril (PRINIVIL,ZESTRIL) 40 MG tablet, Take 40 mg by mouth daily., Disp: , Rfl:  .  tamsulosin (FLOMAX) 0.4 MG CAPS capsule, Take 0.4 mg by mouth daily., Disp: , Rfl:  .  verapamil (CALAN) 120 MG tablet, Take by mouth., Disp: , Rfl:   Allergies  Allergen Reactions  . Pravastatin Other (See Comments)    Joint myalgias    Review of Systems Constitutional: -fever, -chills, -sweats, -unexpected weight change, -decreased appetite, -fatigue Allergy: -sneezing, -itching, -congestion Dermatology: +changing moles, --rash, -lumps ENT: -runny nose, -ear pain, -sore throat, -hoarseness, -sinus pain, -teeth pain, - ringing in ears, -hearing loss, -nosebleeds Cardiology: -chest  pain, -palpitations, -swelling, -difficulty breathing when lying flat, -waking up short of breath Respiratory: -cough, -shortness of breath, -difficulty breathing with exercise or exertion, -wheezing, -coughing up blood Gastroenterology: -abdominal pain, -nausea, -vomiting, -diarrhea, -constipation, -blood in stool, -changes in bowel movement, -difficulty swallowing or eating Hematology: -bleeding, -bruising  Musculoskeletal: -joint aches, -muscle aches, -joint swelling, -  back pain, -neck pain, -cramping, -changes in gait Ophthalmology: denies vision changes, eye redness, itching, discharge Urology: -burning with urination, -difficulty urinating, -blood in urine, -urinary frequency, -urgency, -incontinence Neurology: -headache, -weakness, -tingling, -numbness, -memory loss, -falls, -dizziness Psychology: -depressed mood, -agitation, -sleep problems Male GU: no testicular mass, pain, no lymph nodes swollen, no swelling, no rash.     Objective:  BP 140/88   Pulse 85   Temp 97.7 F (36.5 C) (Oral)   Ht  (1.803 m)   Wt 223 lb 6.4 oz (101.3 kg)   SpO2 97%   BMI 31.16 kg/m   General appearance: alert, no distress, WD/WN, Caucasian male Skin: scattered macules, no obvious worrisome lesions HEENT: normocephalic, conjunctiva/corneas normal, sclerae anicteric, PERRLA, EOMi, nares patent, no discharge or erythema, pharynx normal Oral cavity: MMM, tongue normal, teeth with good repair Neck: supple, no lymphadenopathy, no thyromegaly, no masses, normal ROM, no bruits Chest: non tender, normal shape and expansion Heart: RRR, normal S1, S2, no murmurs Lungs: CTA bilaterally, no wheezes, rhonchi, or rales Abdomen: +bs, soft, non tender, non distended, no masses, no hepatomegaly, no splenomegaly, no bruits Back: non tender, normal ROM, no scoliosis Musculoskeletal: upper extremities non tender, no obvious deformity, normal ROM throughout, lower extremities non tender, no obvious deformity,  normal ROM throughout Extremities: no edema, no cyanosis, no clubbing Pulses: 2+ symmetric, upper and lower extremities, normal cap refill Neurological: alert, oriented x 3, CN2-12 intact, strength normal upper extremities and lower extremities, sensation normal throughout, DTRs 2+ throughout, no cerebellar signs, gait normal Psychiatric: normal affect, behavior normal, pleasant  GU: normal male external genitalia,circumcised, nontender, left base of penis with small subtle 2-22mm diameter lesion, otherwise no other masses, no hernia, no lymphadenopathy Rectal: anus nontender, prostate mildly enlarged, no nodules   Assessment and Plan :   Encounter Diagnoses  Name Primary?  . Encounter for health maintenance examination in adult Yes  . Essential hypertension, benign   . Hyperlipidemia, unspecified hyperlipidemia type   . Type 2 diabetes mellitus with hyperglycemia, with long-term current use of insulin (HCC)   . Obstructive sleep apnea syndrome   . Other allergic rhinitis   . Family history of premature CAD   . Screening for prostate cancer   . Low testosterone   . Family history of melanoma   . Chronic sinusitis, unspecified location   . Vaccine counseling   . Penile lesion   . Ataxia   . Obesity, unspecified classification, unspecified obesity type, unspecified whether serious comorbidity present   . Screen for STD (sexually transmitted disease)     Physical exam - discussed and counseled on healthy lifestyle, diet, exercise, preventative care, vaccinations, sick and well care, proper use of emergency dept and after hours care, and addressed their concerns.    Health screening: See your eye doctor yearly for routine vision care. See your dentist yearly for routine dental care including hygiene visits twice yearly.  Discussed STD testing, discussed prevention, condom use, means of transmission  Cancer screening We will try and get a copy of prior colonoscopy  Discussed PSA,  prostate exam, and prostate cancer screening risks/benefits.      Vaccinations: Advised yearly influenza vaccine Advised to check insurance coverage for pneumococcal, Tdap, and shingles vaccine   Acute issues discussed: Referral to Urology for lesion at base of penis and discussion of low testosterone  Separate significant chronic issues discussed: HTN -we will call pharmacy, [facility] you for cardiologist to get clarification on his blood pressure medicines  Diabetes-pending labs we will give some other recommendations.  He is currently supposed to be taking Lantus 70 units daily but had cut this in half recently to ration his medication.  He has been on other oral agents in the past  Hyperlipidemia-pending call back   I reviewed the labs he had done in January 2020 showing elevated triglycerides, low HDL, glucose 140, hemoglobin A1c 8.8%, testosterone 184 ng/dL, PSA 0.4, normal CBC, normal comprehensive metabolic panel otherwise  BPH-continue Flomax, recent PSA normal  There is a relatively new subtle subcutaneous nodule along the base of the penis-referral to urology  Chronic sinus problems which interferes with his ability to use CPAP- has seen ENT prior, allergist prior.  Pending labs consider treatment recommendations.  Ataxia, obstructive sleep apnea-follow-up with neurology as planned  Yadrian was seen today for annual exam.  Diagnoses and all orders for this visit:  Encounter for health maintenance examination in adult -     HIV Antibody (routine testing w rflx) -     RPR -     GC/Chlamydia Probe Amp -     Hemoglobin A1c -     Hepatitis C antibody  Essential hypertension, benign  Hyperlipidemia, unspecified hyperlipidemia type  Type 2 diabetes mellitus with hyperglycemia, with long-term current use of insulin (HCC) -     Hemoglobin A1c  Obstructive sleep apnea syndrome  Other allergic rhinitis  Family history of premature CAD  Screening for prostate  cancer  Low testosterone  Family history of melanoma  Chronic sinusitis, unspecified location  Vaccine counseling  Penile lesion -     Ambulatory referral to Urology  Ataxia  Obesity, unspecified classification, unspecified obesity type, unspecified whether serious comorbidity present  Screen for STD (sexually transmitted disease) -     HIV Antibody (routine testing w rflx) -     RPR -     GC/Chlamydia Probe Amp -     Hepatitis C antibody   Follow-up pending labs, yearly for physical

## 2019-03-20 NOTE — Telephone Encounter (Signed)
Called the patient to inform them that our office has placed new protocols in place for our office visits. Due to the virus pandemic our office is reducing our number of office visits in order to minimize the risk to our patients and healthcare providers. Advised that our office is now providing the capability to offer the patients phone visits at this time. Informed of what that process looks like and informed that the telephone office visit will still be billed through insurance and due to [place] we need them to know since the appointment is taking place over the phone, we can't guarantee the security of the phone line. With that said if we do move forward I would have to get verbal consent to completed the call over the phone. At this time pt declined phone visit and I pushed out his apt until April 30th at 1 pm.

## 2019-03-20 NOTE — Telephone Encounter (Signed)
Called the patient to advise him that we would like to discuss getting him rescheduled for sleep consult apt. We are offering telephone potentially virtual visits at some point and wanted to discuss this with him. Instructed the patient to call back and we could discuss further.

## 2019-03-21 ENCOUNTER — Encounter: Payer: Self-pay | Admitting: Medical

## 2019-03-21 LAB — HEPATITIS C ANTIBODY: Hep C Virus Ab: <0.1 s/co ratio (ref 0.0–0.9)

## 2019-03-21 LAB — HEMOGLOBIN A1C
Est. average glucose Bld gHb Est-mCnc: 255 mg/dL
Hgb A1c MFr Bld: 10.5 % — ABNORMAL HIGH (ref 4.8–5.6)

## 2019-03-21 LAB — GC/CHLAMYDIA PROBE AMP
Chlamydia trachomatis, NAA: NEGATIVE
Neisseria gonorrhoeae by PCR: NEGATIVE

## 2019-03-21 LAB — RPR: RPR Ser Ql: NONREACTIVE

## 2019-03-21 LAB — HIV ANTIBODY (ROUTINE TESTING W REFLEX): HIV Screen 4th Generation wRfx: NONREACTIVE

## 2019-03-22 ENCOUNTER — Other Ambulatory Visit: Payer: Self-pay | Admitting: Medical

## 2019-03-22 MED ORDER — INSULIN PEN NEEDLE 32G X 4 MM MISC: 1.0000 | Freq: Every day | 11 refills | Status: DC

## 2019-03-22 MED ORDER — LISINOPRIL 40 MG PO TABS: 40.0000 mg | ORAL_TABLET | Freq: Every day | ORAL | 3 refills | Status: DC

## 2019-03-22 MED ORDER — INSULIN GLARGINE 100 UNIT/ML ~~LOC~~ SOLN: 75.0000 [IU] | Freq: Every day | SUBCUTANEOUS | 2 refills | Status: DC

## 2019-03-22 MED ORDER — HYDROCHLOROTHIAZIDE 25 MG PO TABS: 25.0000 mg | ORAL_TABLET | Freq: Every day | ORAL | 3 refills | Status: DC

## 2019-03-22 MED ORDER — ATORVASTATIN CALCIUM 20 MG PO TABS
20.0000 mg | ORAL_TABLET | Freq: Every day | ORAL | 3 refills | Status: DC
Start: 2019-03-22 — End: 2019-04-24

## 2019-03-22 MED ORDER — METFORMIN HCL 500 MG PO TABS: 500.0000 mg | ORAL_TABLET | Freq: Two times a day (BID) | ORAL | 1 refills | Status: DC

## 2019-03-23 ENCOUNTER — Other Ambulatory Visit: Payer: Self-pay | Admitting: Medical

## 2019-03-23 MED ORDER — HYDRALAZINE HCL 25 MG PO TABS: 50.0000 mg | ORAL_TABLET | Freq: Two times a day (BID) | ORAL | 2 refills | Status: DC

## 2019-03-23 MED ORDER — CLONIDINE HCL 0.2 MG PO TABS: 0.2000 mg | ORAL_TABLET | Freq: Two times a day (BID) | ORAL | 2 refills | Status: DC

## 2019-03-24 ENCOUNTER — Telehealth: Payer: Self-pay

## 2019-03-24 ENCOUNTER — Telehealth: Payer: Self-pay | Admitting: Allergy

## 2019-03-24 NOTE — Telephone Encounter (Signed)
Anne please advise. Pt has tried and failed Xhance. Thanks.

## 2019-03-24 NOTE — Telephone Encounter (Signed)
Patient is calling about sinus issues Patient wants to know if any medications or medical regimens that are really going to work - or does he need to just consider surgery?? Patient was told that his sinuses are "messed up" and he doesn't take any medications currently for them  Please call patient to answer any questions

## 2019-03-24 NOTE — Telephone Encounter (Signed)
Patient called back and states that he wanted to know why he has to take all this medication?   There is no need for him to be taking 3 different high dose BP pills and he doesn't understand why you cant do something different.   He gave me the verapamil 120 1 tab three times a day and also doxepin 25 mg daily.   He is very confusing and not very nice at all.  He wants you to call him to discuss this on Monday.  I didn't send in verapamil or doxepin to the pharmacy.

## 2019-03-24 NOTE — Telephone Encounter (Signed)
Call to pt, left message to call back

## 2019-03-24 NOTE — Telephone Encounter (Signed)
Patient wants refill on Doxephin 25 mg sent to the pharmacy.

## 2019-03-27 ENCOUNTER — Telehealth: Payer: Self-pay | Admitting: Medical

## 2019-03-27 ENCOUNTER — Encounter: Payer: Self-pay | Admitting: Medical

## 2019-03-27 ENCOUNTER — Other Ambulatory Visit: Payer: Self-pay | Admitting: Medical

## 2019-03-27 MED ORDER — DAPAGLIFLOZIN PROPANEDIOL 5 MG PO TABS: 5.0000 mg | ORAL_TABLET | Freq: Every day | ORAL | 2 refills | Status: DC

## 2019-03-27 MED ORDER — TAMSULOSIN HCL 0.4 MG PO CAPS: 0.4000 mg | ORAL_CAPSULE | Freq: Every day | ORAL | 1 refills | Status: DC

## 2019-03-27 MED ORDER — DOXEPIN HCL 10 MG PO CAPS: 10.0000 mg | ORAL_CAPSULE | Freq: Every day | ORAL | 2 refills | Status: DC

## 2019-03-27 MED ORDER — VERAPAMIL HCL ER 120 MG PO TBCR: 120.0000 mg | EXTENDED_RELEASE_TABLET | Freq: Every day | ORAL | 2 refills | Status: DC

## 2019-03-27 NOTE — Telephone Encounter (Signed)
Pt called and wants to talk to you about 2 medications that he isnt able to take. Pt would not tell me which medications and he does not want to speak to anyone else about the medications but you. He can be reached at [phone]

## 2019-03-27 NOTE — Telephone Encounter (Signed)
Cyndi - Please see the letter that I want to mail to him.  I also need you to call his pharmacy [facility] and go back over the list I typed as we changed several medicines today.  They need to discontinue the listed ones at their pharmacy  FYI I called and spoke to patient about his medications and labs.  He is frustrated that he is on some he pills.  He came to me last week as a new patient on 5 different blood pressure pills.  Over the weekend he also decided he was not going to take metformin or hydrochlorothiazide which makes it challenging to read to his medication regimen  He has bad diarrhea with metformin, and feels exhausted and lightheaded on hydrochlorothiazide.  Apparently our discussion last week was not completely accurate as he had been out of some of his medicines I thought he was still taking  I mailed him a updated list today of recommendations

## 2019-03-27 NOTE — Telephone Encounter (Signed)
Patient's chart reviewed and patient contacted by phone. He is requesting the name of an ENT specialist for a second opinion regarding sinus anatomy. Recommendation for evaluation and treatment to Dr. Suszanne Conners ENT in [place] for recurrent sinusitis and post nasal drainage despite aggressive medical treatment. He will consider allergen immunotherapy as well.

## 2019-03-27 NOTE — Progress Notes (Signed)
Recommendations:  Diabetes increase Lantus to 75 units nightly BEGIN Farxiga 5 mg, 1 tablet daily.  Increase water intake on this medication   High blood pressure  Continue Lisinopril 40mg  1 tablet daily  Continue Hydralazine 25mg , 2 tablet twice daily  BEGIN Verapamil 120mg , CR 1 tablet daily (when you run out of your current supply of regular regular Verapamil)  STOP Clonidine by using 1 tablet daily for 3 days, then 1 tablet every other day for 1 week, then stop.  STOP Hydrochlorothiazide   Prostate enlargement Continue Tamsulosin daily  Insomnia Continue Doxepin nightly   High cholesterol continue Lipitor daily  Monitor your blood sugars and blood pressures, and lets plan to follow up in 1 month unless problems in the meantime.

## 2019-03-27 NOTE — Telephone Encounter (Signed)
Called pharmacy and verified RX and changes.   Letter mailed to patient.

## 2019-03-28 ENCOUNTER — Other Ambulatory Visit: Payer: Self-pay | Admitting: Medical

## 2019-03-28 ENCOUNTER — Telehealth: Payer: Self-pay | Admitting: Medical

## 2019-03-28 MED ORDER — EMPAGLIFLOZIN 10 MG PO TABS: 10.0000 mg | ORAL_TABLET | Freq: Every day | ORAL | 0 refills | Status: DC

## 2019-03-28 NOTE — Telephone Encounter (Signed)
P.A. FARXIGA completed and denied for plan exclusion.  Called [facility] T# [phone] covered alternative is Jardiance and pt will pay $30 for 30 days or $60 for 90 mail order.  Do you want to switch ?

## 2019-03-28 NOTE — Telephone Encounter (Signed)
I sent [place] Pepper as an alternative to [place].  Please let him know about the insurance denial

## 2019-03-29 NOTE — Telephone Encounter (Signed)
Pt informed

## 2019-03-30 ENCOUNTER — Telehealth: Payer: Self-pay | Admitting: Medical

## 2019-03-30 NOTE — Telephone Encounter (Signed)
Pt called and stated that his insurance will pay for Tetanus and pneumococcal vaccines. Will not pay for shingrix . They state he is not old enough. Pt can be reached at [phone].

## 2019-03-31 ENCOUNTER — Other Ambulatory Visit: Payer: Self-pay

## 2019-03-31 ENCOUNTER — Telehealth: Payer: Self-pay | Admitting: Medical

## 2019-03-31 ENCOUNTER — Ambulatory Visit (INDEPENDENT_AMBULATORY_CARE_PROVIDER_SITE_OTHER): Payer: Commercial Managed Care - PPO | Admitting: Medical

## 2019-03-31 DIAGNOSIS — F339 Major depressive disorder, recurrent, unspecified: Secondary | ICD-10-CM

## 2019-03-31 DIAGNOSIS — R7989 Other specified abnormal findings of blood chemistry: Secondary | ICD-10-CM | POA: Diagnosis not present

## 2019-03-31 DIAGNOSIS — E291 Testicular hypofunction: Secondary | ICD-10-CM | POA: Diagnosis not present

## 2019-03-31 DIAGNOSIS — G4733 Obstructive sleep apnea (adult) (pediatric): Secondary | ICD-10-CM | POA: Diagnosis not present

## 2019-03-31 MED ORDER — DULOXETINE HCL 30 MG PO CPEP: 30.0000 mg | ORAL_CAPSULE | Freq: Every day | ORAL | 2 refills | Status: DC

## 2019-03-31 MED ORDER — TESTOSTERONE CYPIONATE 200 MG/ML IM SOLN: 200.0000 mg | INTRAMUSCULAR | 2 refills | Status: DC
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
## 2019-03-31 NOTE — Telephone Encounter (Signed)
Ok, have him schedule nurse visit for Tdap and Pneumococcal 23 at his convenience

## 2019-03-31 NOTE — Telephone Encounter (Signed)
Cyndi Can you please check with [name] [name] to see if we can send him for sleep study?  I am not sure if they are still doing sleep studies at the moment or not, reagrding home sleep studies?  He will be getting his testosterone from the pharmacy and bring it in to demonstrate proper use.  He is going to start home self testosterone injections in his thigh but he needs to demonstrate that he can do this in the office.

## 2019-03-31 NOTE — Telephone Encounter (Signed)
Patient notified and wants to have a virtual visit today.

## 2019-04-03 ENCOUNTER — Ambulatory Visit: Payer: Commercial Managed Care - PPO | Admitting: Allergy

## 2019-04-04 NOTE — Telephone Encounter (Signed)
Ok, please let him know.   Is [facility] going to keep up with call backs or do we need to do this somehow?

## 2019-04-04 NOTE — Telephone Encounter (Signed)
[name] [name] is not preforming sleep studies at this time.   They are scheduling out for June at this time.

## 2019-04-05 ENCOUNTER — Other Ambulatory Visit: Payer: Self-pay | Admitting: *Deleted

## 2019-04-05 ENCOUNTER — Telehealth: Payer: Self-pay | Admitting: Medical

## 2019-04-05 DIAGNOSIS — G4730 Sleep apnea, unspecified: Secondary | ICD-10-CM

## 2019-04-05 NOTE — Telephone Encounter (Signed)
[facility] is scheduling the sleep study tests for June at this time. They will call patient and schedule. I went ahead and ordered test.

## 2019-04-05 NOTE — Telephone Encounter (Signed)
thanks

## 2019-04-05 NOTE — Telephone Encounter (Signed)
Hey sorry forgot to document, I sent his referral to Dr Suszanne Conners on 03/27/2019

## 2019-04-05 NOTE — Telephone Encounter (Signed)
Pt called & states needs P.A. for testosterone,  States has tried Androgel in the past

## 2019-04-10 ENCOUNTER — Other Ambulatory Visit: Payer: Self-pay | Admitting: Medical

## 2019-04-10 ENCOUNTER — Telehealth: Payer: Self-pay | Admitting: Medical

## 2019-04-10 DIAGNOSIS — R7989 Other specified abnormal findings of blood chemistry: Secondary | ICD-10-CM

## 2019-04-10 NOTE — Telephone Encounter (Signed)
Ok , have him come in for testosterone lab this week

## 2019-04-10 NOTE — Telephone Encounter (Signed)
Pt's prior auth for testosterone was denied due to only one lab value in chart. Per insurance pt needs two done prior to 10 AM. Please advise pt at [phone]. Pt is off this Thursday and Friday and would like to come in then.

## 2019-04-11 NOTE — Telephone Encounter (Signed)
Pt was called and appt made.

## 2019-04-12 ENCOUNTER — Telehealth: Payer: Self-pay | Admitting: Medical

## 2019-04-12 ENCOUNTER — Other Ambulatory Visit: Payer: Self-pay | Admitting: Medical

## 2019-04-12 ENCOUNTER — Telehealth: Payer: Self-pay

## 2019-04-12 MED ORDER — DULOXETINE HCL 30 MG PO CPEP: 30.0000 mg | ORAL_CAPSULE | Freq: Every day | ORAL | 0 refills | Status: DC

## 2019-04-12 MED ORDER — HYDRALAZINE HCL 25 MG PO TABS: 50.0000 mg | ORAL_TABLET | Freq: Two times a day (BID) | ORAL | 1 refills | Status: DC

## 2019-04-12 MED ORDER — EMPAGLIFLOZIN 10 MG PO TABS: 10.0000 mg | ORAL_TABLET | Freq: Every day | ORAL | 0 refills | Status: DC

## 2019-04-12 MED ORDER — TAMSULOSIN HCL 0.4 MG PO CAPS: 0.4000 mg | ORAL_CAPSULE | Freq: Every day | ORAL | 1 refills | Status: DC

## 2019-04-12 MED ORDER — INSULIN GLARGINE 100 UNIT/ML ~~LOC~~ SOLN: 75.0000 [IU] | Freq: Every day | SUBCUTANEOUS | 1 refills | Status: DC

## 2019-04-12 MED ORDER — DOXEPIN HCL 10 MG PO CAPS: 10.0000 mg | ORAL_CAPSULE | Freq: Every day | ORAL | 0 refills | Status: DC

## 2019-04-12 MED ORDER — VERAPAMIL HCL ER 120 MG PO TBCR: 120.0000 mg | EXTENDED_RELEASE_TABLET | Freq: Every day | ORAL | 0 refills | Status: DC

## 2019-04-12 NOTE — Telephone Encounter (Signed)
New pharmacy pt needs refills for his medicine JARDIANCE, DOXEPIN HCL, VERAPAMIL, TAMSULOSIN, HYDRALAZINE, LANTUS SOLOSTAR, DULEXENTINE CAP, NEEDS IT TO GO TO THE [facility] [place], [place] - [address]

## 2019-04-12 NOTE — Telephone Encounter (Signed)
Patient called states that his hydralazine was sent to pharmacy with 60 tabs and was supposed to be 120 tabs, his lantus was only sent in for 20 days and now he has to pay his copay again for the rest of the month.   He has called in the refills to is pharmacy and would like for you to correct the quantity and then he states that his insurance will be sending request for 90 day supply through mail order.   Please correct.

## 2019-04-12 NOTE — Telephone Encounter (Signed)
Is this ok to refill?  

## 2019-04-13 ENCOUNTER — Other Ambulatory Visit (INDEPENDENT_AMBULATORY_CARE_PROVIDER_SITE_OTHER): Payer: Commercial Managed Care - PPO

## 2019-04-13 ENCOUNTER — Other Ambulatory Visit: Payer: Self-pay

## 2019-04-13 DIAGNOSIS — Z23 Encounter for immunization: Secondary | ICD-10-CM | POA: Diagnosis not present

## 2019-04-13 DIAGNOSIS — R7989 Other specified abnormal findings of blood chemistry: Secondary | ICD-10-CM | POA: Diagnosis not present

## 2019-04-14 LAB — TESTOSTERONE,FREE AND TOTAL
Testosterone, Free: 14 pg/mL (ref 7.2–24.0)
Testosterone: 220 ng/dL — ABNORMAL LOW (ref 264–916)

## 2019-04-14 NOTE — Progress Notes (Signed)
New P.A. completed for testosterone

## 2019-04-14 NOTE — Telephone Encounter (Signed)
P.A. denied, pt needs 2 testosterone levels below normal.  Pt was advised to come in and have another test done which was completed and was also below normal.   New P.A. was completed

## 2019-04-23 NOTE — Telephone Encounter (Signed)
P.A. approved til 04/17/20, pt informed, faxed pharmacy

## 2019-04-24 ENCOUNTER — Telehealth: Payer: Self-pay | Admitting: Medical

## 2019-04-24 ENCOUNTER — Other Ambulatory Visit: Payer: Self-pay | Admitting: Medical

## 2019-04-24 MED ORDER — ATORVASTATIN CALCIUM 20 MG PO TABS: 20.0000 mg | ORAL_TABLET | Freq: Every day | ORAL | 0 refills | Status: DC

## 2019-04-24 MED ORDER — LISINOPRIL 40 MG PO TABS: 40.0000 mg | ORAL_TABLET | Freq: Every day | ORAL | 0 refills | Status: DC

## 2019-04-24 NOTE — Telephone Encounter (Signed)
Pt needs refills Atorvastatin #90 & Lisinopril #90 & Testosterone (28ml for 90 days supply) all sent to [facility] mail order

## 2019-04-24 NOTE — Telephone Encounter (Signed)
Also pt has to pay the same co pay every time he gets the Testosterone 1 ml filled which is only a 2 week supply so that is why he was asking for 90 days at mail order

## 2019-04-24 NOTE — Telephone Encounter (Signed)
I sent the lisinopril and atorvastatin to [facility] but we will need to do a testosterone lab visit in the next few weeks before sending 90 day supply.   (look back in last few phone messages.last office note, as I believe we are suppose to do virtual med check or in person visit in the near future).

## 2019-04-24 NOTE — Telephone Encounter (Signed)
Patient has an appointment for lab 05-11-19.

## 2019-04-26 ENCOUNTER — Other Ambulatory Visit: Payer: Self-pay | Admitting: Medical

## 2019-04-26 MED ORDER — TESTOSTERONE CYPIONATE 200 MG/ML IM SOLN: 200.0000 mg | INTRAMUSCULAR | 0 refills | Status: DC

## 2019-04-26 NOTE — Telephone Encounter (Signed)
I sent to [facility]

## 2019-04-27 ENCOUNTER — Institutional Professional Consult (permissible substitution): Payer: Self-pay | Admitting: Neurology

## 2019-04-28 ENCOUNTER — Telehealth: Payer: Self-pay | Admitting: Medical

## 2019-04-28 NOTE — Telephone Encounter (Signed)
See prior note

## 2019-04-28 NOTE — Telephone Encounter (Signed)
Pharmacy sent is request for ancillary supplies for syringes and or needles required for administration of testosterone 200 mg/ml  Please send to [facility] [place] - [phone] [place]

## 2019-04-28 NOTE — Telephone Encounter (Signed)
Please call in syringes and needles to mail order.  I can't send electronically.  #30 syringes, #30 needles, 5 refills.  (use the same gauge and sizes we use here for testosterone injection).  I assume 3 cc syringe and 1.5inch 22 gauge needle.

## 2019-05-01 NOTE — Telephone Encounter (Signed)
[facility]ed [facility] the [facility].

## 2019-05-03 ENCOUNTER — Other Ambulatory Visit: Payer: Self-pay

## 2019-05-03 DIAGNOSIS — R7989 Other specified abnormal findings of blood chemistry: Secondary | ICD-10-CM

## 2019-05-03 LAB — PROLACTIN: Prolactin: 10.8 ng/mL (ref 4.0–15.2)

## 2019-05-03 LAB — FSH/LH
FSH: 4.2 m[IU]/mL (ref 1.5–12.4)
LH: 5 m[IU]/mL (ref 1.7–8.6)

## 2019-05-03 LAB — SPECIMEN STATUS REPORT

## 2019-05-03 LAB — TSH: TSH: 1.9 u[IU]/mL (ref 0.450–4.500)

## 2019-05-03 MED ORDER — NEEDLES & SYRINGES MISC: 5 refills | Status: DC

## 2019-05-05 DIAGNOSIS — R350 Frequency of micturition: Secondary | ICD-10-CM | POA: Diagnosis not present

## 2019-05-05 DIAGNOSIS — N486 Induration penis plastica: Secondary | ICD-10-CM | POA: Diagnosis not present

## 2019-05-08 ENCOUNTER — Telehealth: Payer: Self-pay | Admitting: Medical

## 2019-05-08 NOTE — Telephone Encounter (Signed)
Records received from [facility] [place]. Sending back for review.

## 2019-05-11 ENCOUNTER — Other Ambulatory Visit: Payer: Commercial Managed Care - PPO

## 2019-05-11 ENCOUNTER — Other Ambulatory Visit: Payer: Self-pay

## 2019-05-11 ENCOUNTER — Ambulatory Visit: Payer: Commercial Managed Care - PPO | Admitting: Neurology

## 2019-05-11 ENCOUNTER — Encounter: Payer: Self-pay | Admitting: Neurology

## 2019-05-11 VITALS — BP 153/90 | HR 90 | Temp 96.9°F | Ht 70.0 in | Wt 226.0 lb

## 2019-05-11 DIAGNOSIS — G4733 Obstructive sleep apnea (adult) (pediatric): Secondary | ICD-10-CM | POA: Diagnosis not present

## 2019-05-11 DIAGNOSIS — Z789 Other specified health status: Secondary | ICD-10-CM | POA: Insufficient documentation

## 2019-05-11 DIAGNOSIS — J309 Allergic rhinitis, unspecified: Secondary | ICD-10-CM | POA: Diagnosis not present

## 2019-05-11 NOTE — Patient Instructions (Signed)
Safe Surgery and Sleep Apnea Sleep apnea is a condition in which breathing pauses or becomes shallow during sleep. Most people with the condition are not aware that they have it. It is important for your health care providers to know whether or not you have sleep apnea, especially if you are having surgery. Sleep apnea can increase your risk of complications during and after surgery. What is sleep apnea screening? Sleep apnea screening is a test to determine if you are at risk for sleep apnea. Before you have surgery, get screened for sleep apnea and talk with your surgeon and primary health care provider about your results. Screening usually involves answering a list of questions about your sleep quality. Ask your health care provider if you can be screened, or take a screening test yourself. You can find these tests online at the American Sleep Apnea Association website. Some questions you may be asked include:  Do you snore?  Is your sleep restless?  Do you have daytime sleepiness?  Has a partner or spouse told you that you stop breathing during sleep?  Have you had trouble concentrating or memory loss? Answer these questions honestly. If a screening test is positive, this means you are at risk for the condition. Further testing may be needed to confirm a diagnosis of sleep apnea. Why does sleep apnea increase the risk for complications? Untreated sleep apnea increases the risk for certain complications during and after surgery. This is because when you have sleep apnea, your airways are more sensitive to medicines used during surgery. The airways can collapse and block the flow of air.  Having untreated sleep apnea can increase your risk for:  A longer stay in the recovery room or hospital.  Breathing difficulties such as low oxygen levels after surgery.  Increased pain after surgery.  Irregular heart rhythms.  Stroke.  Heart attack. You and your health care provider can take steps  to help prevent these and other complications. What should I do if I have sleep apnea?  Before surgery  Tell your health care provider and anesthesia specialist that you have sleep apnea. Discuss your individual risks based on your screening results, the type of surgery you will be having, and other medical conditions that you have.  If you have a sleep apnea device (positive airway pressure device), wear it as prescribed. If you have not been wearing your device, talk with your health care provider about why you have not been wearing it. There are ways to improve your use of the device, such as: ? Adjusting the mask. ? Adding humidified air. ? Getting treatment for nasal congestion.  Do not use any products that contain nicotine or tobacco, such as cigarettes and e-cigarettes. If you need help quitting, ask your health care provider. On the day of surgery  If instructed by your health care provider, bring your sleep apnea device with you.  Wear your sleep apnea device when you are sleeping during your hospital stay, or as told by your health care provider.  Ask your health care provider what special considerations will be taken during and after your surgery. After surgery  You may need to be given extra oxygen and wear a continuous oxygen monitor (pulse oximetry).  For your safety, you may need to stay in the recovery room or hospital for longer than is normal.  Follow instructions from your health care provider about wearing your sleep device: ? Anytime you are sleeping, including during daytime naps. ? While taking   prescription pain medicines, sleeping medicines, or medicines that make you drowsy.  If your health care provider approves, raise the head of your bed or lie on your side. Do not lie flat on your back.  Follow instructions from your health care provider about medicines: ? Avoid using sleep medicines unless they are prescribed by a health care provider who is aware of  the results of your sleep apnea screening. ? Avoid using sleep medicines while taking opioid pain medicine. ? Limit your use of opioid pain medicines as much as possible. Ask your health care provider what is a safe amount to use. ? Ask about using pain medicines that do not affect your breathing, such as NSAIDs or acetaminophen. Where to find more information For more information about sleep apnea screening and healthy sleep, visit these websites:  Centers for Disease Control and Prevention: www.cdc.gov/sleep/index.html  American Sleep Apnea Association: www.sleepapnea.org Contact a health care provider if:  You have sleep apnea or think you may be at risk for sleep apnea, and you are scheduled for surgery. Get help right away if:  You have trouble breathing.  You are very drowsy and cannot stay awake.  You are told that you have pauses in your breathing during sleep after surgery.  You have chest pain.  You have a fast heartbeat. Summary  It is important for your health care providers to know whether or not you have sleep apnea, especially if you are having surgery.  If you have sleep apnea, you are at an increased risk for complications during surgery.  You and your health care provider can take precautions to help prevent complications. If you have sleep apnea, make sure to tell your health care provider and anesthesia specialist. This information is not intended to replace advice given to you by your health care provider. Make sure you discuss any questions you have with your health care provider. Document Released: 04/01/2017 Document Revised: 04/01/2017 Document Reviewed: 04/01/2017 Elsevier Interactive Patient Education  2019 Elsevier Inc.  

## 2019-05-11 NOTE — Addendum Note (Signed)
Addended by: Melvyn Novas on: 05/11/2019 01:48 PM   Modules accepted: Orders

## 2019-05-11 NOTE — Progress Notes (Signed)
SLEEP MEDICINE CLINIC   Provider:  Melvyn Novas, M D  Primary Care Physician:  Jac Canavan, PA-C   Referring Provider:  Wyline Mood, DO at [facility]    Chief Complaint  Patient presents with   New Patient (Initial Visit)    pt alone, pt states he is needing to get back on a CPAP. he had a SS 10 yrs ago and was started on Bipap and has not used it for about 6 yrs. pt admits to snoring.     HPI:  [name] [name] is a 58 y.o. male patient , seen here upon referral  from Dr. Wyline Mood for a sleep apnea re- evaluation.  Chief complaint according to patient :      Sleep and medical history: [name] [name] reports having been tested at  [facility] and later at [facility] center (over 5 years ago ) - he remembers being diagnosed with 118 apneas per hour, and that he had his first sleep study taken in a seated position due to orthopnea. He started off on his CPAP could not tolerated was changed to a BiPAP and still could not tolerate it.  His positive airway pressure intolerance was closely linked to a condition of chronic sinusitis, sinus congestion and a known seasonal pattern. He underwent a tonsillectomy but his sinus headaches responded, his nasal airflow was still restricted.  At the allergy and asthma center it was documented that he had a tonsillectomy and uvuloplasty, but he continues to have very poor quality sleep, that he is using fluticasone nasal spray and that he tested positive for mold and certain tree allergens.  His obstructive sleep apnea syndrome has been untreated for many years now.  He also has sacroiliitis, radiculopathy which was not further specified degeneration of the lumbar or lumbosacral intervertebral discs were documented. DM2, anxiety.   Family sleep history: [name] [name] reports that his father and brother were very loud snores and may well have had apnea without being tested.   Social history: lives alone, nobody  witnessed his sleep. 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 Forme smoker, ETOH socially- caffeine iced coffee one a day.   Sleep habits are as follows: Dinner time is is at 5.30 PM, bedtime is variable- mostly 9.30- PM and he reads to go to sleep.  Sleeps prone  , on  1 pillow - Has nocturia at 12.30. he is not sure if he dreams, seemingly rare. He goes back to sleep and sleeps through-  Alarm set 5.30 AM, not refreshed not restored. Often with headaches but mayorly improved after surgery - 50 % of the mornings he gets up with no problem.  Naps in daytime- before lunch , after lunch after 3 espressi. TST at night 7 hours and in daytime another 2 hours, many times.    Review of Systems: Out of a complete 14 system review, the patient complains of only the following symptoms, and all other reviewed systems are negative. How likely are you to doze in the following situations: 0 = not likely, 1 = slight chance, 2 = moderate chance, 3 = high chance  Sitting and Reading? Watching Television? Sitting inactive in a public place (theater or meeting)? Lying down in the afternoon when circumstances permit? Sitting and talking to someone? Sitting quietly after lunch without alcohol? In a car, while stopped for a few minutes in traffic? As a passenger in a car for an hour  without a break?  Total =15/ 24 , with naps its closer to 10/ 24. FSS at 51/ 63 points.   see corona virus related fears.     Social History   Socioeconomic History   Marital status: Single    Spouse name: Not on file   Number of children: Not on file   Years of education: Not on file   Highest education level: Not on file  Occupational History   Not on file  Social Needs   Financial resource strain: Not on file   Food insecurity:    Worry: Not on file    Inability: Not on file   Transportation needs:    Medical: Not on file    Non-medical: Not on file  Tobacco Use   Smoking status: Former Smoker     Packs/day: 1.00    Years: 10.00    Pack years: 10.00    Last attempt to quit: 04/20/1992    Years since quitting: 27.0   Smokeless tobacco: Never Used  Substance and Sexual Activity   Alcohol use: No   Drug use: No   Sexual activity: Not on file  Lifestyle   Physical activity:    Days per week: 0 days    Minutes per session: Not on file   Stress: Not on file  Relationships   Social connections:    Talks on phone: Not on file    Gets together: Not on file    Attends religious service: Not on file    Active member of club or organization: Not on file    Attends meetings of clubs or organizations: Not on file    Relationship status: Not on file   Intimate partner violence:    Fear of current or ex partner: Not on file    Emotionally abused: Not on file    Physically abused: Not on file    Forced sexual activity: Not on file  Other Topics Concern   Not on file  Social History Narrative   Not on file    Family History  Problem Relation Age of Onset   Cancer Mother        lung   Cancer Father        prostate, mets to bone, lung   Cancer Brother        melanoma   Heart disease Maternal Grandmother        MI   Lung disease Maternal Grandfather        black lung   Heart disease Paternal Grandmother    Stroke Paternal Grandmother    Heart disease Paternal Grandfather    Heart disease Brother [id number]       MI   Diabetes Brother     Past Medical History:  Diagnosis Date   Anxiety    Depression    Diabetes mellitus    Type 2   Edema    Enlarged prostate    Environmental allergies    Environmental and seasonal allergies    GERD (gastroesophageal reflux disease)    Headache(784.0)    sinus and migraines   Hyperlipidemia    Hypertension    IBS (irritable bowel syndrome)    Low testosterone    Sleep apnea    has used BiPAP in past; last sleep study >10 years    Past Surgical History:  Procedure Laterality Date   COLONOSCOPY   09/2018   normal, repeat in 10 years per patient[place]   KNEE  ARTHROSCOPY Left    SACROILIAC JOINT FUSION Right 05/02/2014   Procedure: SACROILIAC JOINT FUSION;  Surgeon: Emilee Hero, MD;  Location: [facility] OR;  Service: Orthopedics;  Laterality: Right;  Right sided sacroiliac joint fusion   TONSILLECTOMY     uvula removed    Current Outpatient Medications  Medication Sig Dispense Refill   atorvastatin (LIPITOR) 20 MG tablet Take 1 tablet (20 mg total) by mouth at bedtime. 90 tablet 0   doxepin (SINEQUAN) 10 MG capsule Take 1 capsule (10 mg total) by mouth at bedtime. 90 capsule 0   DULoxetine (CYMBALTA) 30 MG capsule Take 1 capsule (30 mg total) by mouth daily. 90 capsule 0   empagliflozin (JARDIANCE) 10 MG TABS tablet Take 10 mg by mouth daily. 90 tablet 0   hydrALAZINE (APRESOLINE) 25 MG tablet Take 2 tablets (50 mg total) by mouth 2 (two) times daily. 360 tablet 1   insulin glargine (LANTUS) 100 UNIT/ML injection Inject 0.75 mLs (75 Units total) into the skin daily. 3 mo supply 70 mL 1   Insulin Pen Needle (BD PEN NEEDLE NANO U/F) 32G X 4 MM MISC 1 each by Does not apply route at bedtime. 100 each 11   lisinopril (ZESTRIL) 40 MG tablet Take 1 tablet (40 mg total) by mouth daily. 90 tablet 0   Needles & Syringes MISC As directed 1ml of testosterone every 14 days 100 each 5   tamsulosin (FLOMAX) 0.4 MG CAPS capsule Take 1 capsule (0.4 mg total) by mouth daily. 90 capsule 1   testosterone cypionate (DEPOTESTOSTERONE CYPIONATE) 200 MG/ML injection Inject 1 mL (200 mg total) into the muscle every 14 (fourteen) days. 30 mL 0   verapamil (CALAN-SR) 120 MG CR tablet Take 1 tablet (120 mg total) by mouth at bedtime. 90 tablet 0   No current facility-administered medications for this visit.     Allergies as of 05/11/2019 - Review Complete 05/11/2019  Allergen Reaction Noted   Pravastatin Other (See Comments) 11/29/2011   Hydrochlorothiazide  03/31/2019    Metformin and related  03/31/2019    Vitals: BP (!) 153/90    Pulse 90    Temp (!) 96.9 F (36.1 C)    Ht  (1.778 m)    Wt 226 lb (102.5 kg)    BMI 32.43 kg/m  Last Weight:  Wt Readings from Last 1 Encounters:  05/11/19 226 lb (102.5 kg)   ZOX:WRUE mass index is 32.43 kg/m.     Last Height:   Ht Readings from Last 1 Encounters:  05/11/19  (1.778 m)    Physical exam:  General: The patient is awake, alert and appears not in acute distress. The patient is well groomed. Head: Normocephalic, atraumatic. Neck is supple. Mallampati  Status post UPPP, 2,  neck circumference:18. 25 ".  Nasal airflow is sestricted when lying down- , patent while seated.  Retrognathia is not seen.  Cardiovascular:  Regular rate and rhythm , without  murmurs or carotid bruit, and without distended neck veins. Respiratory: Lungs are clear to auscultation. Skin:  Without evidence of edema, or rash Trunk: BMI is 32.5 . The patient's posture is relaxed  Neurologic exam : The patient is awake and alert, oriented to place and time.    Attention span & concentration ability appears normal.  Speech is fluent,  without dysarthria, dysphonia or aphasia.  Mood and affect are appropriate.  Cranial nerves: Pupils are equal and briskly reactive to light. Funduscopic exam without  evidence of pallor  or edema. Extraocular movements  in vertical and horizontal planes intact and without nystagmus.  Visual fields by finger perimetry are intact. Hearing to finger rub intact.   Facial sensation intact to fine touch.  Facial motor strength is symmetric and tongue and uvula move midline. Shoulder shrug was symmetrical.   Motor exam: Normal tone, muscle bulk and symmetric strength in all extremities. Sensory:  Fine touch, pinprick and vibration were normal. Coordination: Rapid alternating movements in the fingers/hands normal without evidence of ataxia, dysmetria or tremor. Gait and station: Patient walks without  assistive device . Deep tendon reflexes: in the  upper and lower extremities are symmetric and intact. Babinski maneuver response is downgoing.   Assessment:  After physical and neurologic examination, review of laboratory studies,  Personal review of imaging studies, reports of other /same  Imaging studies, results of polysomnography and / or neurophysiology testing and pre-existing records as far as provided in visit., my assessment is   1)   excessive daytime sleepiness.  2) Long standing history of severe OSA - quoted above was an AHI of over 100/h. Aggravated by nasal congestion, simus and adenoid swelling.  3) status post UPPP, but not septo-plasty nor turbinate reduction.   The patient was advised of the nature of the diagnosed disorder , the treatment options and the  risks for general health and wellness arising from not treating the condition.   I spent more than 45 minutes of face to face time with the patient.  Greater than 50% of time was spent in counseling and coordination of care. We have discussed the diagnosis and differential and I answered the patient's questions.    Plan:  Treatment plan and additional workup :   1) Sleep study to confirm presence of OSA after ENT surgery -patient with well documented CPAP, BiPAP intolerance now had ENT surgery- will need to see if he is an inspire candidate, or if he now can use CPAP ?   2) may be a dental device candidate  3) INSPIRE procedure  4) ENT to re evaluate sinus system;. Since allergy testing was not helpful  Dr Haroldine Laws retired, he likes to be hooked up with ENT that could also do inspire.        No orders of the defined types were placed in this encounter.    No follow-ups on file.   Melvyn Novas, MD 05/11/2019, 1:08 PM  Certified in Neurology by ABPN Certified in Sleep Medicine by [facility] [address] [place], [place] [address]

## 2019-05-15 NOTE — Telephone Encounter (Signed)
Patient is down for 05/30/19 to See Dr Suszanne Conners.

## 2019-05-16 ENCOUNTER — Other Ambulatory Visit: Payer: Self-pay | Admitting: Medical

## 2019-05-16 NOTE — Telephone Encounter (Signed)
Is this ok to refill?  

## 2019-05-24 ENCOUNTER — Telehealth: Payer: Self-pay | Admitting: Medical

## 2019-05-24 NOTE — Telephone Encounter (Signed)
I would consider adding a beta blocker like Carvedilol/Coreg and keeping all other medications the same.    If agreeable lets add Coreg 3.125mg  BID

## 2019-05-24 NOTE — Telephone Encounter (Signed)
Patient called and states his bp is running high and feels he needs medication change  BP 166/101, 133/81, 169/98, 170/90   He has been checking bp on machine at home  I advised him that I felt that you would want a visit with him but not sure if you wanted  Virtual or in office   Please advise patient what type of appt he needs to schedule  Did ask screening questions No known contacts No symptoms No travel

## 2019-05-25 ENCOUNTER — Other Ambulatory Visit: Payer: Self-pay | Admitting: Medical

## 2019-05-25 MED ORDER — CARVEDILOL 3.125 MG PO TABS: 3.1250 mg | ORAL_TABLET | Freq: Two times a day (BID) | ORAL | 0 refills | Status: DC

## 2019-05-25 NOTE — Telephone Encounter (Signed)
Patient notified of recommendations and states that he is willing to start new medication.    Patient states that he has an appointment with ENT on next Friday and his sleep study in on 06-14-19.

## 2019-05-25 NOTE — Telephone Encounter (Signed)
Patient states that he is already on 3 BP and they are all old and he really does not want to add another one.   Is there new that you can prescribe?   Please advise

## 2019-05-25 NOTE — Telephone Encounter (Signed)
Coreg sent to local pharmacy.  Lets do in office med check in 2-3 weeks

## 2019-05-25 NOTE — Telephone Encounter (Signed)
Patient notified and appointment made for 06-14-19 at 3

## 2019-05-25 NOTE — Telephone Encounter (Signed)
Cyndi - I don't understand your reply?  Part of his BP issue is related to sleep apnea which he is working on getting this resolved.  He just saw neurology about sleep apnea.   He is already on high doses of his current BP medications.   If his BP is still not controlled, then we add additional medication all the while trying to eat a healthy low salt low fat diet, exercise regulalry and consider other evaluation.   When was his last echocardiogram or stress test?  Do we have a copy of this Cyndi?

## 2019-06-02 DIAGNOSIS — J342 Deviated nasal septum: Secondary | ICD-10-CM | POA: Insufficient documentation

## 2019-06-02 DIAGNOSIS — J34829 Nasal valve collapse, unspecified: Secondary | ICD-10-CM | POA: Insufficient documentation

## 2019-06-05 ENCOUNTER — Telehealth: Payer: Self-pay | Admitting: Family Medicine

## 2019-06-05 NOTE — Telephone Encounter (Signed)
appt tomorrow

## 2019-06-05 NOTE — Telephone Encounter (Signed)
Ok, then its time for a follow up, preferably in office to check BP

## 2019-06-05 NOTE — Telephone Encounter (Signed)
Pt called and states Beta blocker not helping BP at all.  Please advise [phone]

## 2019-06-06 ENCOUNTER — Other Ambulatory Visit: Payer: Self-pay

## 2019-06-06 ENCOUNTER — Ambulatory Visit: Payer: Commercial Managed Care - PPO | Admitting: Medical

## 2019-06-06 ENCOUNTER — Encounter: Payer: Self-pay | Admitting: Medical

## 2019-06-06 VITALS — BP 130/80 | HR 80 | Temp 98.3°F | Resp 16 | Ht 70.0 in | Wt 226.6 lb

## 2019-06-06 DIAGNOSIS — R7989 Other specified abnormal findings of blood chemistry: Secondary | ICD-10-CM | POA: Diagnosis not present

## 2019-06-06 DIAGNOSIS — E1165 Type 2 diabetes mellitus with hyperglycemia: Secondary | ICD-10-CM | POA: Diagnosis not present

## 2019-06-06 DIAGNOSIS — F329 Major depressive disorder, single episode, unspecified: Secondary | ICD-10-CM

## 2019-06-06 DIAGNOSIS — G4733 Obstructive sleep apnea (adult) (pediatric): Secondary | ICD-10-CM

## 2019-06-06 DIAGNOSIS — I10 Essential (primary) hypertension: Secondary | ICD-10-CM

## 2019-06-06 DIAGNOSIS — E785 Hyperlipidemia, unspecified: Secondary | ICD-10-CM

## 2019-06-06 DIAGNOSIS — R4589 Other symptoms and signs involving emotional state: Secondary | ICD-10-CM | POA: Insufficient documentation

## 2019-06-06 DIAGNOSIS — Z794 Long term (current) use of insulin: Secondary | ICD-10-CM

## 2019-06-06 MED ORDER — CARVEDILOL 6.25 MG PO TABS: 6.2500 mg | ORAL_TABLET | Freq: Two times a day (BID) | ORAL | 2 refills | Status: DC

## 2019-06-06 NOTE — Progress Notes (Signed)
Subjective: Chief Complaint  Patient presents with  . med check    fasting med check bp check     Here for med check.  At last visit and with subsequent phone call since last visit we have discussed blood pressure, mood, fatigue, low testosterone.  Overall he feels like his mood is little better and he has been less irritated with his coworkers.  But other than that has not felt really much different than last visit.  Blood pressures continue to run in the 150/80-90 range  He denies chest pain, shortness of breath, edema.  Hypertension-he is taking Coreg 3.125 BID, hydralazine 25 milligrams, 2 tablets bid, lisinopril 40, verapamil 120 daily.  Diabetes-compliant with Lantus 25, Jardiance 10 mg daily, getting glucose readings less than 130 most of the time fasting  Depression-he is taking Cymbalta but not a whole lot of improvement since last visit  Likewise, using testosterone injections every 2 weeks at home without much improvement  Past Medical History:  Diagnosis Date  . Anxiety   . Depression   . Diabetes mellitus    Type 2  . Edema   . Enlarged prostate   . Environmental allergies   . Environmental and seasonal allergies   . GERD (gastroesophageal reflux disease)   . Headache(784.0)    sinus and migraines  . Hyperlipidemia   . Hypertension   . IBS (irritable bowel syndrome)   . Low testosterone   . Sleep apnea    has used BiPAP in past; last sleep study >10 years   Current Outpatient Medications on File Prior to Visit  Medication Sig Dispense Refill  . atorvastatin (LIPITOR) 20 MG tablet Take 1 tablet (20 mg total) by mouth at bedtime. 90 tablet 0  . doxepin (SINEQUAN) 10 MG capsule Take 1 capsule (10 mg total) by mouth at bedtime. 90 capsule 0  . DULoxetine (CYMBALTA) 30 MG capsule TAKE 1 CAPSULE BY MOUTH  DAILY 90 capsule 0  . empagliflozin (JARDIANCE) 10 MG TABS tablet Take 10 mg by mouth daily. 90 tablet 0  . hydrALAZINE (APRESOLINE) 25 MG tablet Take 2  tablets (50 mg total) by mouth 2 (two) times daily. 360 tablet 1  . insulin glargine (LANTUS) 100 UNIT/ML injection Inject 0.75 mLs (75 Units total) into the skin daily. 3 mo supply 70 mL 1  . Insulin Pen Needle (BD PEN NEEDLE NANO U/F) 32G X 4 MM MISC 1 each by Does not apply route at bedtime. 100 each 11  . lisinopril (ZESTRIL) 40 MG tablet Take 1 tablet (40 mg total) by mouth daily. 90 tablet 0  . Needles & Syringes MISC As directed 1ml of testosterone every 14 days 100 each 5  . tamsulosin (FLOMAX) 0.4 MG CAPS capsule Take 1 capsule (0.4 mg total) by mouth daily. 90 capsule 1  . testosterone cypionate (DEPOTESTOSTERONE CYPIONATE) 200 MG/ML injection Inject 1 mL (200 mg total) into the muscle every 14 (fourteen) days. 30 mL 0  . verapamil (CALAN-SR) 120 MG CR tablet Take 1 tablet (120 mg total) by mouth at bedtime. 90 tablet 0   No current facility-administered medications on file prior to visit.    ROS as in subjective    Objective: BP 130/80   Pulse 80   Temp 98.3 F (36.8 C) (Oral)   Resp 16   Ht 5\' 10"  (1.778 m)   Wt 226 lb 9.6 oz (102.8 kg)   SpO2 94%   BMI 32.51 kg/m   Gen: wd, wn, nad  Psych: somewhat flat affect Heart rrr, normal s1, s2, no murmurs    Assessment: Encounter Diagnoses  Name Primary?  . Essential hypertension, benign Yes  . Type 2 diabetes mellitus with hyperglycemia, with long-term current use of insulin ([place])   . Obstructive sleep apnea syndrome   . Low testosterone   . Hyperlipidemia, unspecified hyperlipidemia type   . Depressed mood      Plan Hypertension-not at goal.  Increase carvedilol to 6.25 mg twice daily, continue lisinopril 40 mg daily, continue hydralazine 25 mg 2 tablets twice daily, continue verapamil SR 120 mg daily  Diabetes-continue Lantus 75 units daily, Jardiance 10 mg daily, labs today, continue glucose monitoring, needs to start exercising, work on healthy diet.  hyperlipidemia-continue Lipitor 20 mg  Low  testosterone-no improvement so far on 200 mg injection every 2 weeks at home.  Labs today  Depression-continue Cymbalta.  OSA-follow-up with neurology at ENT as planned   [name] [name] was seen today for med check.  Diagnoses and all orders for this visit:  Essential hypertension, benign -     Comprehensive metabolic panel -     CBC  Type 2 diabetes mellitus with hyperglycemia, with long-term current use of insulin (HCC) -     Hemoglobin A1c  Obstructive sleep apnea syndrome  Low testosterone -     Testosterone  Hyperlipidemia, unspecified hyperlipidemia type  Depressed mood  Other orders -     carvedilol (COREG) 6.25 MG tablet; Take 1 tablet (6.25 mg total) by mouth 2 (two) times daily with a meal.

## 2019-06-07 ENCOUNTER — Other Ambulatory Visit: Payer: Self-pay | Admitting: Medical

## 2019-06-07 LAB — HEMOGLOBIN A1C
Est. average glucose Bld gHb Est-mCnc: 197 mg/dL
Hgb A1c MFr Bld: 8.5 % — ABNORMAL HIGH (ref 4.8–5.6)

## 2019-06-07 LAB — COMPREHENSIVE METABOLIC PANEL
ALT: 22 IU/L (ref 0–44)
AST: 22 IU/L (ref 0–40)
Albumin/Globulin Ratio: 2.2 (ref 1.2–2.2)
Albumin: 4.8 g/dL (ref 3.8–4.9)
Alkaline Phosphatase: 70 IU/L (ref 39–117)
BUN/Creatinine Ratio: 18 (ref 9–20)
BUN: 20 mg/dL (ref 6–24)
Bilirubin Total: 0.4 mg/dL (ref 0.0–1.2)
CO2: 23 mmol/L (ref 20–29)
Calcium: 9.8 mg/dL (ref 8.7–10.2)
Chloride: 103 mmol/L (ref 96–106)
Creatinine, Ser: 1.1 mg/dL (ref 0.76–1.27)
GFR calc Af Amer: 86 mL/min/{1.73_m2} (ref >59)
GFR calc non Af Amer: 75 mL/min/{1.73_m2} (ref >59)
Globulin, Total: 2.2 g/dL (ref 1.5–4.5)
Glucose: 88 mg/dL (ref 65–99)
Potassium: 4.6 mmol/L (ref 3.5–5.2)
Sodium: 140 mmol/L (ref 134–144)
Total Protein: 7 g/dL (ref 6.0–8.5)

## 2019-06-07 LAB — TESTOSTERONE: Testosterone: 526 ng/dL (ref 264–916)

## 2019-06-07 LAB — CBC
Hematocrit: 49.6 % (ref 37.5–51.0)
Hemoglobin: 16.2 g/dL (ref 13.0–17.7)
MCH: 28.1 pg (ref 26.6–33.0)
MCHC: 32.7 g/dL (ref 31.5–35.7)
MCV: 86 fL (ref 79–97)
Platelets: 276 10*3/uL (ref 150–450)
RBC: 5.77 x10E6/uL (ref 4.14–5.80)
RDW: 13.3 % (ref 11.6–15.4)
WBC: 9.9 10*3/uL (ref 3.4–10.8)

## 2019-06-07 MED ORDER — EMPAGLIFLOZIN 25 MG PO TABS: 25.0000 mg | ORAL_TABLET | Freq: Every day | ORAL | 1 refills | Status: DC

## 2019-06-07 MED ORDER — TESTOSTERONE CYPIONATE 200 MG/ML IM SOLN: 200.0000 mg | INTRAMUSCULAR | 3 refills | Status: DC

## 2019-06-07 MED ORDER — INSULIN GLARGINE 100 UNIT/ML ~~LOC~~ SOLN: 80.0000 [IU] | Freq: Every day | SUBCUTANEOUS | 3 refills | Status: DC

## 2019-06-07 NOTE — Progress Notes (Signed)
meds refiled

## 2019-06-09 ENCOUNTER — Other Ambulatory Visit: Payer: Self-pay | Admitting: Medical

## 2019-06-09 MED ORDER — TESTOSTERONE CYPIONATE 200 MG/ML IM SOLN: 200.0000 mg | INTRAMUSCULAR | 3 refills | Status: DC

## 2019-06-09 MED ORDER — EMPAGLIFLOZIN 25 MG PO TABS: 25.0000 mg | ORAL_TABLET | Freq: Every day | ORAL | 1 refills | Status: DC

## 2019-06-09 MED ORDER — INSULIN GLARGINE 100 UNIT/ML ~~LOC~~ SOLN: 80.0000 [IU] | Freq: Every day | SUBCUTANEOUS | 3 refills | Status: DC

## 2019-06-12 ENCOUNTER — Telehealth: Payer: Self-pay

## 2019-06-12 NOTE — Telephone Encounter (Signed)
Pt called and stated that after starting Beta Blocker his blood pressure is still up. Please advise?

## 2019-06-12 NOTE — Telephone Encounter (Signed)
We usually have to give it some time, typically several weeks for BPs to stabilize.   So lets give it 3-4 weeks before we give up on the med changes.

## 2019-06-13 ENCOUNTER — Other Ambulatory Visit: Payer: Self-pay | Admitting: Medical

## 2019-06-13 NOTE — Telephone Encounter (Signed)
Pt stated bp came down only a little. Pt was advised of provider message.

## 2019-06-14 ENCOUNTER — Ambulatory Visit (INDEPENDENT_AMBULATORY_CARE_PROVIDER_SITE_OTHER): Payer: Commercial Managed Care - PPO | Admitting: Neurology

## 2019-06-14 ENCOUNTER — Encounter: Payer: Self-pay | Admitting: Medical

## 2019-06-14 DIAGNOSIS — G4730 Sleep apnea, unspecified: Secondary | ICD-10-CM

## 2019-06-14 DIAGNOSIS — G4733 Obstructive sleep apnea (adult) (pediatric): Secondary | ICD-10-CM | POA: Diagnosis not present

## 2019-06-15 ENCOUNTER — Other Ambulatory Visit: Payer: Self-pay | Admitting: Medical

## 2019-06-15 NOTE — Telephone Encounter (Signed)
Is this ok to refill?  

## 2019-06-22 ENCOUNTER — Telehealth: Payer: Self-pay | Admitting: Neurology

## 2019-06-25 ENCOUNTER — Other Ambulatory Visit: Payer: Self-pay | Admitting: Medical

## 2019-06-28 NOTE — Procedures (Signed)
Patient Information     First Name: [name] [name] Last Name: [name] [name] ID: [id number]  Birth Date: Apr 19, 1961 Age: 89 Gender: Male  Referring Provider: Wyline MoodYoon Kim, DO  BMI: 32.5 (W=227 lb, H=5' 10'')  Neck Circ.:  18.25" Epworth:  15/24   Sleep Study Information    Study Date: Jun 15, 2019 S/H/A Version: 5.1.77.7 / 4.1.1528 / 7177  History: [name] is a 58 year old male patient referred by his allergy specialist. He reports having been tested for OSA at the  [facility] and later at [facility] (over 5 years ago) - he remembers being diagnosed with 118 apneas per hour, and that he had his first sleep study taken in a seated position due to orthopnea.  He started off on CPAP, which he could not tolerate and was changed to a BiPAP -and still could not tolerate therapy.  His positive airway pressure intolerance was closely linked to a condition of chronic sinusitis, sinus congestion and a known seasonal pattern. He underwent a ENT surgery and his sinus headaches responded, his nasal airflow however remained restricted. At the allergy and asthma center it was documented that he had a tonsillectomy, adenoidectomy and uvulo-plasty, but he continues to have very poor quality sleep, is using fluticasone nasal spray and he tested positive for mold and certain tree allergens.  His obstructive sleep apnea syndrome has been untreated for many years now.   He also has sacroiliitis, radiculopathy which was not further specified degeneration of the lumbar or lumbosacral intervertebral discs were documented. DM2, anxiety.       Summary & Diagnosis:    A sufficient total sleep time was recorded with an AHI of only 5.1/h and slightly higher RDI (snoring driven) .  There was no evidence of hypoxemia and no tachy-brady arrhythmia.  Apnea was not evident when not in supine sleep position.   Recommendations:      At this time there is no need for CPAP intervention not Inspire device as his sleep  apnea is uncomplicated and mild. To treat snoring further (if desired) I recommend a dental device and this can still be based on the diagnosis of Mild OSA. Avoiding supine sleep will help.  These findings do not explain the patients reported hypersomnia at an endorsed Epworth score of 15/ 24 points.   Electronically Signed:  Melvyn Novasarmen Daimian Sudberry, MD    06-27-2019          Sleep Summary  Oxygen Saturation Statistics   Start Study Time: End Study Time: Total Recording Time:  8:19:10 PM 5:20:26 AM 9 h, 1 min  Total Sleep Time % REM of Sleep Time:  7 h, 50 min  14.7    Mean: 94 Minimum: 90 Maximum: 98  Mean of Desaturations Nadirs (%):   92  Oxygen Desaturation. %: 4-9 10-20 >20 Total  Events Number Total  19 100.0  0 0.0  0 0.0  19 100.0  Oxygen Saturation: <90 <=88 <85 <80 <70  Duration (minutes): Sleep % 0.0 0.0 0.0 0.0 0.0 0.0 0.0 0.0 0.0 0.0     Respiratory Indices      Total Events REM NREM All Night  pRDI:  89  pAHI:  40 ODI:  19  pAHIc:  14  % CSR: 0.0 7.0 1.7 0.9 0.9 12.1 5.7 2.7 2.0 11.4 5.1 2.4 1.8       Pulse Rate Statistics during Sleep (BPM)      Mean: 71 Minimum: 53  Maximum: 99    Indices are calculated using technically valid sleep time of  7 hrs, 49 min. Central-Indices are calculated using technically valid sleep time of  7  hrs, 42 min. pRDI/pAHI are calculated using oxi desaturations ? 3%  Body Position Statistics  Position Supine Prone Right Left Non-Supine  Sleep (min) 65.0 180.0 220.8 5.0 405.8  Sleep % 13.8 38.2 46.9 1.1 86.2  pRDI 14.8 13.7 7.9 N/A 10.8  pAHI 11.1 6.4 1.9 N/A 4.2  ODI 1.9 4.0 0.8 N/A 2.5     Snoring Statistics Snoring Level (dB) >40 >50 >60 >70 >80 >Threshold (45)  Sleep (min) 158.9 9.1 0.7 0.0 0.0 29.8  Sleep % 33.7 1.9 0.1 0.0 0.0 6.3    Mean: 41 dB Sleep Stages Chart

## 2019-06-29 ENCOUNTER — Telehealth: Payer: Self-pay | Admitting: Neurology

## 2019-06-29 DIAGNOSIS — Z794 Long term (current) use of insulin: Secondary | ICD-10-CM

## 2019-06-29 DIAGNOSIS — Z789 Other specified health status: Secondary | ICD-10-CM

## 2019-06-29 DIAGNOSIS — G4733 Obstructive sleep apnea (adult) (pediatric): Secondary | ICD-10-CM

## 2019-06-29 DIAGNOSIS — R0683 Snoring: Secondary | ICD-10-CM

## 2019-06-29 DIAGNOSIS — I10 Essential (primary) hypertension: Secondary | ICD-10-CM

## 2019-06-29 DIAGNOSIS — J3089 Other allergic rhinitis: Secondary | ICD-10-CM

## 2019-06-29 DIAGNOSIS — E1165 Type 2 diabetes mellitus with hyperglycemia: Secondary | ICD-10-CM

## 2019-06-29 NOTE — Telephone Encounter (Signed)
Called the pt and reviewed his results with him. Advised the patient that Dr Brett Fairy didn't come across the patient having any problems with oxygen level or heart rate concerns. The apnea was very mild at 5.1. informed the pt of this and immediately he states that he doesn't feel this is accurate study. The pt states that he is extremely exhausted and will drink 4 cups of expresso and still be tired. He also doesn't understand how he can go from having 100+ apnea events to 5. Patient does not feel this test is accurate. Advised I was calling with the finding that Dr Brett Fairy could see and that all I can do is make her aware of what he states and potentially we could look into other alternatives to see what could be causing his daytime sleepiness. Pt has never been tested for narcolepsy. Patient would like me to bring this to Dr Dohmeier attention and would like to move forward with possibly completing another test in the sleep lab. Informed him It really depends on what insurance will allow [place] to do but I will have Dr Brett Fairy and our sleep lab manager look into options for him and we will reach out with next steps. Pt verbalized understanding.     Study dated 06-14-2019   Summary & Diagnosis:  A sufficient total sleep time was recorded with an AHI of only 5.1/h and slightly higher RDI (snoring driven)- only in supine sleep was there any apnea noted. .  There was no evidence of hypoxemia and no tachy-brady arrhythmia.   Recommendations:   At this time there is no need for CPAP intervention not Inspire device as his sleep apnea is uncomplicated and mild. To treat snoring further (if desired) I recommend a dental device and this can still be based on the diagnosis of Mild OSA. Avoiding supine sleep will help.  These findings do not explain the patients reported hypersomnia at an endorsed Epworth score of 15/ 24 points. .    Electronically Signed: Larey Seat, MD  06-27-2019

## 2019-06-29 NOTE — Telephone Encounter (Signed)
If the patient feels the HST is not an accurate reflection of his sleep and has a clinical concern we should go for an in lab study. I will order this as a SPLIT night study. He cannot be a patient in the sleep clinic under workman's compensation coverage- I noted this was listed as his primary and has to be clarified.    He likely will have to undergo COVID testing before SPLIT study. CD  Cc Robin Hyler as Manager  / [facility] Sleep lead/ Gerline Legacy, RN

## 2019-07-03 ENCOUNTER — Telehealth: Payer: Self-pay

## 2019-07-03 NOTE — Telephone Encounter (Signed)
[facility] approved in lab sleep study. We will notify patient to get him scheduled. I will keep the home study with his paperwork to compare when he comes in.

## 2019-07-03 NOTE — Addendum Note (Signed)
Addended by: Larey Seat on: 07/03/2019 08:50 AM   Modules accepted: Orders

## 2019-07-03 NOTE — Telephone Encounter (Signed)
Patient with insulin pump, COVID risk factors; 4 .  I will order PSG with MSLT. If we start SPLITs/ PAPs again, will need him tested for [facility] first.

## 2019-07-03 NOTE — Telephone Encounter (Signed)
In order for insurance to pay this lets order a PSG with Mslt. I will try PSG first, then if not approved I will submit an MSLT with PSG. We are not doing split night studies because of covid screening. I called patient but had no voicemail I will reach out again and explain.

## 2019-07-11 ENCOUNTER — Other Ambulatory Visit: Payer: Self-pay | Admitting: Medical

## 2019-07-31 ENCOUNTER — Ambulatory Visit (INDEPENDENT_AMBULATORY_CARE_PROVIDER_SITE_OTHER): Payer: Commercial Managed Care - PPO | Admitting: Neurology

## 2019-07-31 DIAGNOSIS — G4733 Obstructive sleep apnea (adult) (pediatric): Secondary | ICD-10-CM

## 2019-07-31 DIAGNOSIS — Z794 Long term (current) use of insulin: Secondary | ICD-10-CM

## 2019-07-31 DIAGNOSIS — R0683 Snoring: Secondary | ICD-10-CM

## 2019-07-31 DIAGNOSIS — G4761 Periodic limb movement disorder: Secondary | ICD-10-CM

## 2019-07-31 DIAGNOSIS — I10 Essential (primary) hypertension: Secondary | ICD-10-CM

## 2019-07-31 DIAGNOSIS — Z789 Other specified health status: Secondary | ICD-10-CM

## 2019-07-31 DIAGNOSIS — E1165 Type 2 diabetes mellitus with hyperglycemia: Secondary | ICD-10-CM

## 2019-08-01 NOTE — Procedures (Signed)
PATIENT'S NAME:  [name] [name], [name] [name] DOB:      Mar 10, 1961      MR#:    [id number]     DATE OF RECORDING: 07/31/2019 REFERRING M.D.:  Rexene Alberts DO Study Performed:   Expanded EEG/ Parasomnia Montage Polysomnogram HISTORY:   [name] [name] is a 58 y.o. Caucasian male patient who was seen here on 05-11-2019 upon referral from Dr. Rexene Alberts (Allergy and Asthma) for a sleep apnea re- evaluation in the setting of excessive daytime sleepiness. The patient is interested in an [place] procedure. Mr. Gillian reports having been tested at [place] [facility] and later at [facility] center (over 5 years ago) - he remembers being diagnosed with 118 apneas per hour (?), and that he had his first sleep study taken in a seated position due to orthopnea.  He started off on his CPAP, but could not tolerate it, and was changed to a BiPAP modality. He still could not tolerate BiPAP.  His positive airway pressure intolerance was closely linked to a condition of chronic sinusitis, sinus congestion with a known seasonal pattern. He underwent a tonsillectomy to which his sinus headaches responded, but his nasal airflow was still restricted.  At the allergy and asthma center it was documented that he had a tonsillectomy and uvulo-plasty, but he continues to have very poor quality sleep, that he is using fluticasone nasal spray and that he tested positive for mold and certain tree allergens.   His obstructive sleep apnea syndrome has been untreated for many years now.   He also has sacroiliitis, radiculopathy which was not further specified degeneration of the lumbar or lumbosacral intervertebral discs were documented. DM2, anxiety, Bruxism.  His HST from June 2020 was deemed non valid,   The patient endorsed the Epworth Sleepiness Scale at 15 points.   The patient's weight 227 pounds with a height of 70 (inches), resulting in a BMI of 32.5 kg/m2. The patient's neck circumference measured 18 inches.  CURRENT  MEDICATIONS: Lipitor, Sinequan, Cymbalta, Jardiance, Apresoline, Lantus, Zestril, Flomax, Verapamil   PROCEDURE:  This is a multichannel digital polysomnogram utilizing the Somnostar 11.2 system.  Electrodes and sensors were applied and monitored per AASM Specifications.   EEG, EOG, Chin and Limb EMG, were sampled at 200 Hz.  ECG, Snore and Nasal Pressure, Thermal Airflow, Respiratory Effort, CPAP Flow and Pressure, Oximetry was sampled at 50 Hz. Digital video and audio were recorded.      BASELINE STUDY: Lights Out was at 21:47 and Lights On at 05:17.  Total recording time (TRT) was 450.5 minutes, with a total sleep time (TST) of 303 minutes.   The patient's sleep latency was 103.5 minutes.  REM latency was 270.5 minutes.  The sleep efficiency was 67.3 %.     SLEEP ARCHITECTURE: WASO (Wake after sleep onset) was 43.5 minutes.  There were 103.5 minutes in Stage N1, 109.5 minutes Stage N2, 19.5 minutes Stage N3 and 70.5 minutes in Stage REM.  The percentage of Stage N1 was 34.2%, Stage N2 was 36.1%, Stage N3 was 6.4% and Stage R (REM sleep) was 23.3%.     RESPIRATORY ANALYSIS:  There were a total of 72 respiratory events:  0 apneas and 72 hypopneas with some additional respiratory event related arousals (RERAs).      The total APNEA/HYPOPNEA INDEX (AHI) was 14.3 /hour and the total RESPIRATORY DISTURBANCE INDEX was 16.3 /hour.  0 events occurred in REM sleep and 144 events in NREM. The REM AHI  was 0 /hour, versus a non-REM AHI of 18.6. The patient spent 0 minutes of total sleep time in the supine position and 303 minutes in non-supine. The supine AHI was 0.0 versus a non-supine AHI of 14.3. Most sleep was prone.   OXYGEN SATURATION & C02:  The Wake baseline 02 saturation was 93%, with the lowest being 86%. Time spent below 89% saturation equaled 25 minutes.   PERIODIC LIMB MOVEMENTS:   The patient had a total of 392 Periodic Limb Movements.  The Periodic Limb Movement (PLM) index was 77.6 and the PLM  Arousal index was 8.1/hour.  The arousals were noted as: 87 were spontaneous, 41 were associated with PLMs, and 55 were associated with respiratory events. PLMs were not seen in REM sleep.   Audio and video analysis did not show any abnormal or unusual movements, behaviors, phonations or vocalizations.   The patient took bathroom breaks. Loud Snoring was noted, even in prone position. Bruxism was documented, many arousals caused by PLMs.  PLMs were also recorded in prone position. Most arising from the left leg.  EKG was in keeping with normal sinus rhythm (NSR). Post-study, the patient indicated that sleep was the same as usual.   IMPRESSION:  1. Mild to moderate Obstructive Sleep Apnea (OSA) at AHI 14.3 with a total of 72 events. Very unusual distribution, mostly sparing REM sleep.  2. Severe Periodic Limb Movement Disorder (PLMD), causing many arousals and fragmenting sleep, but not seen in REM sleep.  3. Primary Snoring and Bruxism.  4. Prolonged REM sleep latency with rebounding REM sleep in the last 1.5 hours of sleep.    RECOMMENDATIONS:  1. Advise to treat and re-examine first for causes of PLMs. If these are truly sciatica related, if he may have spinal canal stenosis or not.  2. We can also offer a full-night, attended, PAP titration study to optimize therapy or use an auto titration device. The goal is to see if PAP can now be tolerated, or if [name] [facility].   I certify that I have reviewed the entire raw data recording prior to the issuance of this report in accordance with the Standards of Accreditation of the American Academy of Sleep Medicine (AASM)     Melvyn Novasarmen Amarie Tarte, MD   07-31-2019 Diplomat, American Board of Psychiatry and Neurology  Diplomat, American Board of Sleep Medicine Medical Director, [facility]

## 2019-08-01 NOTE — Addendum Note (Signed)
Addended by: Larey Seat on: 08/01/2019 02:14 PM   Modules accepted: Orders

## 2019-08-03 ENCOUNTER — Telehealth: Payer: Self-pay | Admitting: Neurology

## 2019-08-03 NOTE — Telephone Encounter (Signed)
-----   Message from Larey Seat, MD sent at 08/01/2019  2:14 PM EDT -----  IMPRESSION:  1. Mild to moderate Obstructive Sleep Apnea (OSA) at AHI 14.3 with a total of 72 events. Very unusual distribution, mostly sparing REM sleep.  2. Severe Periodic Limb Movement Disorder (PLMD), causing many arousals and fragmenting sleep, but not seen in REM sleep.  3. Primary Snoring and Bruxism.  4. Prolonged REM sleep latency with rebounding REM sleep in the last 1.5 hours of sleep.    RECOMMENDATIONS:  1. Advise to treat and re-examine first for causes of PLMs. If these are truly sciatica related, if he may have spinal canal stenosis or NP. He reportedly had sacro-iliitis- RLS ?   2. We can also offer a full-night, attended, PAP titration study to optimize therapy or use an auto titration device. The goal is to see if PAP can now be tolerated, or if Mr. Trawick needs a referral to [facility] therapy. PS :  I will order a trial of CPAP/ BiPAP and we can go from there.

## 2019-08-03 NOTE — Telephone Encounter (Signed)
Called the patient and reviewed the sleep study results. Advised the patient of the findings related to AHI, oxygen and PLM's. Pt states that he does complain of back concern and sciatica but that he has been having Restlessness since prior to that. Informed the patient that Dr Brett Fairy would recommend coming in for a titration study and the patient stated, "if I could use a CPAP machine I wouldn't be talking to you right now." patient advised he has tried CPAP in the past and states that he was unable to tolerate. I see the patient had seen Dr Wilburn Cornelia before. I asked if him and Dr Brett Fairy had discussed dental device or inspire. The patient states that he had but that he wouldn't qualify on mild apnea. Patient states that he feels like all this is a waste of time and that he doesn't see how with mild apna of 14 he is exhausted as he is all day. I informed him that with his oxygen level dropping and the apnea his body is not getting the oxygenation that it needs to feel rested. Informed the patient that I can certainly get him set up with a follow up visit to discuss this further with Dr Brett Fairy. Patient agreed to a follow up 08/28/2019 at 11:30 am.

## 2019-08-08 ENCOUNTER — Ambulatory Visit: Payer: Commercial Managed Care - PPO | Admitting: Neurology

## 2019-08-08 ENCOUNTER — Other Ambulatory Visit: Payer: Self-pay | Admitting: Medical

## 2019-08-09 NOTE — Telephone Encounter (Signed)
Is this ok to refill?  Shane's patient. 

## 2019-08-09 NOTE — Telephone Encounter (Signed)
He is requesting year supply on his meds.  Last note reviewed.  BP meds were adjusted.  I do not see any follow-up scheduled.  Okay to refill x 90d only.  When Audelia Acton is back, you can ask when he wants to see this pt back to f/u on his HTN and schedule visit.  (so NOT 3 refills as requested, just 90d)

## 2019-08-25 ENCOUNTER — Telehealth: Payer: Self-pay | Admitting: Medical

## 2019-08-25 ENCOUNTER — Other Ambulatory Visit: Payer: Self-pay | Admitting: Medical

## 2019-08-25 NOTE — Telephone Encounter (Signed)
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    The form apparently had been placed in the back of a pile of papers on my desk so I did not see it initially.  I apologize for this but I did not see the form until he called in and I looked through the old records that are on my desk.  It was not in the typical place that my labs and correspondence arrive on my desk; that was inadvertent  This is actually a waiver form for insulin treated diabetes.  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  I have never done a DOT physical with him.  He came to me as a new patient already on insulin.  So I am not sure if this has come up in the past  Bottom line, here is what he needs to know: 1- a waiver such as this would need a visit to discuss as there are multiple questions and multiple pages on the form  2- generally a waiver approval through the [place] [employer] can take 6 months to get back from [facility].  Thus this is not a quick process.  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  Usually if someone is applying for a waiver, they are not supposed to be driving until the waiver is cleared through [facility].   I do not know how long he has been on insulin but it may be worth referring him to endocrinology now to try to get this in the works  3- this is not a simple form where primary care signed he gets approved.  The [employer] has to approve these forms and like [place] that this can take months.  Thus let us either do a visit to discuss diabetes follow-up and medications, or we can potentially refer to endocrinology for the same if he is going to  potentially stay on insulin.  (If he seems to get agitated or starts using curse words, just let him know we will let our office manager call him back as we are not going to tolerate being cursed at or someone yelling at [place] over and over on the phone.)

## 2019-08-25 NOTE — Telephone Encounter (Signed)
See message from [facility]

## 2019-08-28 ENCOUNTER — Ambulatory Visit: Payer: Commercial Managed Care - PPO | Admitting: Medical

## 2019-08-28 ENCOUNTER — Other Ambulatory Visit: Payer: Self-pay

## 2019-08-28 ENCOUNTER — Encounter: Payer: Self-pay | Admitting: Medical

## 2019-08-28 ENCOUNTER — Ambulatory Visit: Payer: Self-pay | Admitting: Neurology

## 2019-08-28 VITALS — BP 174/90 | HR 76 | Temp 98.2°F | Ht 70.0 in | Wt 230.4 lb

## 2019-08-28 DIAGNOSIS — I10 Essential (primary) hypertension: Secondary | ICD-10-CM | POA: Diagnosis not present

## 2019-08-28 DIAGNOSIS — E785 Hyperlipidemia, unspecified: Secondary | ICD-10-CM

## 2019-08-28 DIAGNOSIS — Z794 Long term (current) use of insulin: Secondary | ICD-10-CM

## 2019-08-28 DIAGNOSIS — E1165 Type 2 diabetes mellitus with hyperglycemia: Secondary | ICD-10-CM

## 2019-08-28 DIAGNOSIS — E669 Obesity, unspecified: Secondary | ICD-10-CM | POA: Diagnosis not present

## 2019-08-28 NOTE — Progress Notes (Signed)
Subjective: Chief Complaint  Patient presents with  . Diabetes    dot forms    Here for diabetes discussion, form completion.  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    Hasn't had DOT clearance since 1996, but recently went for DOT clearance at another facility.  Due to insulin use, was advised to f/u with PCP for form completion.     He has been on Lantus for the past 2 years, at 67 u currently once daily.   He continues on [place] as well. Glucose lately has been well controlled < 130 regulalry.   He is checking glucose BID.   No reported hypoglycemia.    No other symptoms, no polydipsia, no polyuria, no vision changes, no weight changes.  Compliant with rest of medications.  He notes that his job just changed and he lost his insurance as of yesterday.  May need to consider another medication regimen with this insurance change.   No foot concerns.     No other aggravating or relieving factors. No other complaint.  Past Medical History:  Diagnosis Date  . Anxiety   . Depression   . Diabetes mellitus    Type 2  . Edema   . Enlarged prostate   . Environmental allergies   . Environmental and seasonal allergies   . GERD (gastroesophageal reflux disease)   . Headache(784.0)    sinus and migraines  . Hyperlipidemia   . Hypertension   . IBS (irritable bowel syndrome)   . Low testosterone   . Sleep apnea    has used BiPAP in past; last sleep study >10 years   Current Outpatient Medications on File Prior to Visit  Medication Sig Dispense Refill  . atorvastatin (LIPITOR) 20 MG tablet TAKE 1 TABLET BY MOUTH AT  BEDTIME 90 tablet 0  . carvedilol (COREG) 6.25 MG tablet TAKE 1 TABLET(6.25 MG) BY MOUTH TWICE DAILY WITH A MEAL 60 tablet 2  . doxepin (SINEQUAN) 10 MG capsule TAKE 1 CAPSULE BY MOUTH AT  BEDTIME 90 capsule 0  . DULoxetine (CYMBALTA) 30 MG capsule TAKE 1 CAPSULE BY MOUTH  DAILY 90 capsule 0  . empagliflozin  (JARDIANCE) 25 MG TABS tablet Take 25 mg by mouth daily. 90 tablet 1  . hydrALAZINE (APRESOLINE) 25 MG tablet TAKE 2 TABLETS BY MOUTH  TWICE DAILY 360 tablet 0  . insulin glargine (LANTUS) 100 UNIT/ML injection Inject 0.8 mLs (80 Units total) into the skin daily. 3 mo supply 80 mL 3  . Insulin Pen Needle (BD PEN NEEDLE NANO U/F) 32G X 4 MM MISC 1 each by Does not apply route at bedtime. 100 each 11  . lisinopril (ZESTRIL) 40 MG tablet TAKE 1 TABLET BY MOUTH  DAILY 90 tablet 0  . Needles & Syringes MISC As directed 59ml of testosterone every 14 days 100 each 5  . tamsulosin (FLOMAX) 0.4 MG CAPS capsule TAKE 1 CAPSULE BY MOUTH  DAILY 90 capsule 0  . testosterone cypionate (DEPOTESTOSTERONE CYPIONATE) 200 MG/ML injection Inject 1 mL (200 mg total) into the muscle every 14 (fourteen) days. 30 mL 3  . verapamil (CALAN-SR) 120 MG CR tablet TAKE 1 TABLET BY MOUTH AT  BEDTIME 90 tablet 0   No current facility-administered medications on file prior to visit.    ROS as in subjective   Objective: BP (!) 174/90   Pulse 76   Temp 98.2 F (36.8 C) (Oral)  Ht 5\' 10"  (1.778 m)   Wt 230 lb 6.4 oz (104.5 kg)   SpO2 92%   BMI 33.06 kg/m   Wt Readings from Last 3 Encounters:  08/28/19 230 lb 6.4 oz (104.5 kg)  06/06/19 226 lb 9.6 oz (102.8 kg)  05/11/19 226 lb (102.5 kg)   BP Readings from Last 3 Encounters:  08/28/19 (!) 174/90  06/06/19 130/80  05/11/19 (!) 153/90   Gen: wd, wn, nad Psych: somewhat irritated today, but questions appropriative    Assessment: Encounter Diagnoses  Name Primary?  . Type 2 diabetes mellitus with hyperglycemia, with long-term current use of insulin (HCC) Yes  . Essential hypertension, benign   . Hyperlipidemia, unspecified hyperlipidemia type   . Obesity, unspecified classification, unspecified obesity type, unspecified whether serious comorbidity present      Plan: We discussed his concerns.   I completed his Insulin Treated Diabetes Mellitus Assessment  Form.   I reviewed his most recent HgbA1C, 8.5% from 2 months ago.  Plan to f/u in 1 month for routine labs, fasting lipid, HgbA1C, CMET.  Discussed need to be good with glucometer testing, compliance with medications and treatment.   Discussed dangers of hypoglycemia.     We discussed overall compliance with regimen.  He is stable, good home readings.   Per rule change from [facility] 10/2018, a waiver is no longer required for diabetics on insulin assuming they are under good control and no severe retinopathy or neuropathy.     He will return the ITDM form to the office where he had his DOT physical and this requires yearly certification.  F/u 26mo   Onalee HuaDavid was seen today for diabetes.  Diagnoses and all orders for this visit:  Type 2 diabetes mellitus with hyperglycemia, with long-term current use of insulin (HCC)  Essential hypertension, benign  Hyperlipidemia, unspecified hyperlipidemia type  Obesity, unspecified classification, unspecified obesity type, unspecified whether serious comorbidity present

## 2019-08-29 ENCOUNTER — Other Ambulatory Visit: Payer: Self-pay | Admitting: Medical

## 2019-10-12 ENCOUNTER — Other Ambulatory Visit: Payer: Self-pay | Admitting: Medical

## 2019-11-01 ENCOUNTER — Other Ambulatory Visit: Payer: Self-pay | Admitting: Medical

## 2019-11-01 NOTE — Telephone Encounter (Signed)
Received fax from [place] for a refill on pts. Carvedilol pt. Last apt was 08/28/19

## 2019-11-02 ENCOUNTER — Telehealth: Payer: Self-pay

## 2019-11-02 NOTE — Telephone Encounter (Signed)
Received fax from [place] for a refill on Carvedilol last filled 08/25/19 last apt. Was 08/28/19

## 2019-11-03 ENCOUNTER — Other Ambulatory Visit: Payer: Self-pay | Admitting: Medical

## 2019-11-03 MED ORDER — CARVEDILOL 6.25 MG PO TABS: ORAL_TABLET | ORAL | 0 refills | Status: DC

## 2019-11-03 NOTE — Telephone Encounter (Signed)
Schedule for diabetes f/u within the next 4-6 weeks, med sent

## 2019-11-03 NOTE — Telephone Encounter (Signed)
I called pt. To schedule diabetes f/u but pt. [name] [name] he is starting a new job Monday and will call us back reschedule once he gets his insurance straight.

## 2019-12-22 ENCOUNTER — Other Ambulatory Visit: Payer: Self-pay | Admitting: Medical

## 2020-01-30 ENCOUNTER — Encounter: Payer: Self-pay | Admitting: Medical

## 2020-01-30 ENCOUNTER — Ambulatory Visit (INDEPENDENT_AMBULATORY_CARE_PROVIDER_SITE_OTHER): Payer: BC Managed Care – PPO | Admitting: Medical

## 2020-01-30 ENCOUNTER — Other Ambulatory Visit: Payer: Self-pay

## 2020-01-30 VITALS — BP 152/90 | HR 80 | Temp 97.6°F | Ht 70.0 in | Wt 217.6 lb

## 2020-01-30 DIAGNOSIS — Z794 Long term (current) use of insulin: Secondary | ICD-10-CM | POA: Diagnosis not present

## 2020-01-30 DIAGNOSIS — Z0000 Encounter for general adult medical examination without abnormal findings: Secondary | ICD-10-CM | POA: Diagnosis not present

## 2020-01-30 DIAGNOSIS — Z808 Family history of malignant neoplasm of other organs or systems: Secondary | ICD-10-CM

## 2020-01-30 DIAGNOSIS — Z113 Encounter for screening for infections with a predominantly sexual mode of transmission: Secondary | ICD-10-CM

## 2020-01-30 DIAGNOSIS — M533 Sacrococcygeal disorders, not elsewhere classified: Secondary | ICD-10-CM

## 2020-01-30 DIAGNOSIS — Z125 Encounter for screening for malignant neoplasm of prostate: Secondary | ICD-10-CM

## 2020-01-30 DIAGNOSIS — N400 Enlarged prostate without lower urinary tract symptoms: Secondary | ICD-10-CM | POA: Insufficient documentation

## 2020-01-30 DIAGNOSIS — G4733 Obstructive sleep apnea (adult) (pediatric): Secondary | ICD-10-CM

## 2020-01-30 DIAGNOSIS — E291 Testicular hypofunction: Secondary | ICD-10-CM | POA: Diagnosis not present

## 2020-01-30 DIAGNOSIS — E1165 Type 2 diabetes mellitus with hyperglycemia: Secondary | ICD-10-CM

## 2020-01-30 DIAGNOSIS — R7989 Other specified abnormal findings of blood chemistry: Secondary | ICD-10-CM

## 2020-01-30 DIAGNOSIS — Z7189 Other specified counseling: Secondary | ICD-10-CM

## 2020-01-30 DIAGNOSIS — E669 Obesity, unspecified: Secondary | ICD-10-CM

## 2020-01-30 DIAGNOSIS — Z8249 Family history of ischemic heart disease and other diseases of the circulatory system: Secondary | ICD-10-CM

## 2020-01-30 DIAGNOSIS — I10 Essential (primary) hypertension: Secondary | ICD-10-CM

## 2020-01-30 DIAGNOSIS — G4700 Insomnia, unspecified: Secondary | ICD-10-CM | POA: Insufficient documentation

## 2020-01-30 DIAGNOSIS — E785 Hyperlipidemia, unspecified: Secondary | ICD-10-CM | POA: Diagnosis not present

## 2020-01-30 DIAGNOSIS — Z7185 Encounter for immunization safety counseling: Secondary | ICD-10-CM

## 2020-01-30 DIAGNOSIS — R4589 Other symptoms and signs involving emotional state: Secondary | ICD-10-CM

## 2020-01-30 MED ORDER — TESTOSTERONE CYPIONATE 200 MG/ML IM SOLN: 200.0000 mg | INTRAMUSCULAR | 3 refills | Status: DC

## 2020-01-30 MED ORDER — TRANDOLAPRIL-VERAPAMIL HCL ER 2-180 MG PO TBCR: 1.0000 | EXTENDED_RELEASE_TABLET | Freq: Every day | ORAL | 2 refills | Status: DC

## 2020-01-30 MED ORDER — CARVEDILOL 12.5 MG PO TABS: 12.5000 mg | ORAL_TABLET | Freq: Two times a day (BID) | ORAL | 1 refills | Status: DC

## 2020-01-30 MED ORDER — VORTIOXETINE HBR 5 MG PO TABS: 5.0000 mg | ORAL_TABLET | Freq: Every day | ORAL | 0 refills | Status: DC

## 2020-01-30 MED ORDER — DOXEPIN HCL 10 MG PO CAPS: 10.0000 mg | ORAL_CAPSULE | Freq: Every day | ORAL | 1 refills | Status: DC

## 2020-01-30 NOTE — Progress Notes (Signed)
Subjective:   HPI  [name] [name] is a 59 y.o. male who presents for Chief Complaint  Patient presents with  . Medication Management    with fasting labs-screeening for cancer   . SEXUALLY TRANSMITTED DISEASE    wants blood work-hepatitis     Patient Care Team: Syona Wroblewski, Camelia Eng, PA-C as PCP - General (Family Medicine) Sees dentist Sees eye doctor Dr. Ellene Route, GI  Concerns: BPs not at goal, home readings 150-180/80 so he took it upon himself to increase to double corge and double hydralazine without calling us  Diabetes - sugars fasting 148-180.  Compliant with lantus 75u daily and Jardiance 25mg  daily  Depression - not tolerating Cymbalta due to sexual side effects.   Has been on a variety of SSRI, Wellbutrin and SNRI in the past.  Wants to try something else.    Wants STD screen.  new partner, no condom use,  Same partner 64mo.  No current symptoms of concern  hyperlipidemia - compliant with medication  Low testosterone - compliant with injections but ran out of needles in the past month  insomnia - uses doxepin nightly  No other aggravating or relieving factors. No other complaint.   Reviewed their medical, surgical, family, social, medication, and allergy history and updated chart as appropriate.  Past Medical History:  Diagnosis Date  . Anxiety   . Depression   . Diabetes mellitus    Type 2  . Edema   . Enlarged prostate   . Environmental allergies   . Environmental and seasonal allergies   . GERD (gastroesophageal reflux disease)   . Headache(784.0)    sinus and migraines  . Hyperlipidemia   . Hypertension   . IBS (irritable bowel syndrome)   . Low testosterone   . Sleep apnea    has used BiPAP in past; last sleep study >10 years    Past Surgical History:  Procedure Laterality Date  . COLONOSCOPY  09/2018   normal, repeat in 10 years per patient.  [place], [place]  . KNEE ARTHROSCOPY Left   . SACROILIAC JOINT FUSION Right 05/02/2014   Procedure:  SACROILIAC JOINT FUSION;  Surgeon: Sinclair Ship, MD;  Location: [facility];  Service: Orthopedics;  Laterality: Right;  Right sided sacroiliac joint fusion  . TONSILLECTOMY     uvula removed    Social History   Socioeconomic History  . Marital status: Single    Spouse name: Not on file  . Number of children: Not on file  . Years of education: Not on file  . Highest education level: Not on file  Occupational History  . Not on file  Tobacco Use  . Smoking status: Former Smoker    Packs/day: 1.00    Years: 10.00    Pack years: 10.00    Quit date: 04/20/1992    Years since quitting: 27.7  . Smokeless tobacco: Never Used  Substance and Sexual Activity  . Alcohol use: No  . Drug use: No  . Sexual activity: Not on file  Other Topics Concern  . Not on file  Social History Narrative  . Not on file   Social Determinants of Health   Financial Resource Strain:   . Difficulty of Paying Living Expenses: Not on file  Food Insecurity:   . Worried About Charity fundraiser in the Last Year: Not on file  . Ran Out of Food in the Last Year: Not on file  Transportation Needs:   . Lack of Transportation (Medical):  Not on file  . Lack of Transportation (Non-Medical): Not on file  Physical Activity: Unknown  . Days of Exercise per Week: 0 days  . Minutes of Exercise per Session: Not on file  Stress:   . Feeling of Stress : Not on file  Social Connections:   . Frequency of Communication with Friends and Family: Not on file  . Frequency of Social Gatherings with Friends and Family: Not on file  . Attends Religious Services: Not on file  . Active Member of Clubs or Organizations: Not on file  . Attends Banker Meetings: Not on file  . Marital Status: Not on file  Intimate Partner Violence:   . Fear of Current or Ex-Partner: Not on file  . Emotionally Abused: Not on file  . Physically Abused: Not on file  . Sexually Abused: Not on file    Family History  Problem  Relation Age of Onset  . Cancer Mother        lung  . Cancer Father        prostate, mets to bone, lung  . Cancer Brother        melanoma  . Heart disease Maternal Grandmother        MI  . Lung disease Maternal Grandfather        black lung  . Heart disease Paternal Grandmother   . Stroke Paternal Grandmother   . Heart disease Paternal Grandfather   . Heart disease Brother 62       MI  . Diabetes Brother      Current Outpatient Medications:  .  atorvastatin (LIPITOR) 20 MG tablet, TAKE 1 TABLET BY MOUTH AT  BEDTIME, Disp: 90 tablet, Rfl: 3 .  doxepin (SINEQUAN) 10 MG capsule, Take 1 capsule (10 mg total) by mouth at bedtime., Disp: 90 capsule, Rfl: 1 .  hydrALAZINE (APRESOLINE) 25 MG tablet, TAKE 2 TABLETS BY MOUTH  TWICE DAILY, Disp: 360 tablet, Rfl: 3 .  insulin glargine (LANTUS) 100 UNIT/ML injection, Inject 0.8 mLs (80 Units total) into the skin daily. 3 mo supply, Disp: 80 mL, Rfl: 3 .  Insulin Pen Needle (BD PEN NEEDLE NANO U/F) 32G X 4 MM MISC, 1 each by Does not apply route at bedtime., Disp: 100 each, Rfl: 11 .  JARDIANCE 25 MG TABS tablet, TAKE 1 TABLET BY MOUTH  DAILY, Disp: 90 tablet, Rfl: 3 .  Needles & Syringes MISC, As directed 50ml of testosterone every 14 days, Disp: 100 each, Rfl: 5 .  tamsulosin (FLOMAX) 0.4 MG CAPS capsule, TAKE 1 CAPSULE BY MOUTH  DAILY, Disp: 90 capsule, Rfl: 3 .  carvedilol (COREG) 12.5 MG tablet, Take 1 tablet (12.5 mg total) by mouth 2 (two) times daily with a meal., Disp: 180 tablet, Rfl: 1 .  testosterone cypionate (DEPOTESTOSTERONE CYPIONATE) 200 MG/ML injection, Inject 1 mL (200 mg total) into the muscle every 14 (fourteen) days., Disp: 30 mL, Rfl: 3 .  trandolapril-verapamil (TARKA) 2-180 MG tablet, Take 1 tablet by mouth daily., Disp: 30 tablet, Rfl: 2 .  vortioxetine HBr (TRINTELLIX) 5 MG TABS tablet, Take 1 tablet (5 mg total) by mouth daily., Disp: 30 tablet, Rfl: 0  Allergies  Allergen Reactions  . Pravastatin Other (See Comments)     Joint myalgias  . Hydrochlorothiazide     lightheadedness  . Metformin And Related     diarrhea    Review of Systems Constitutional: -fever, -chills, -sweats, -unexpected weight change, -decreased appetite, -fatigue Allergy: -sneezing, -itching, -congestion  Dermatology: -changing moles, --rash, -lumps ENT: -runny nose, -ear pain, -sore throat, -hoarseness, -sinus pain, -teeth pain, - ringing in ears, -hearing loss, -nosebleeds Cardiology: -chest pain, -palpitations, -swelling, -difficulty breathing when lying flat, -waking up short of breath Respiratory: -cough, -shortness of breath, -difficulty breathing with exercise or exertion, -wheezing, -coughing up blood Gastroenterology: -abdominal pain, -nausea, -vomiting, -diarrhea, -constipation, -blood in stool, -changes in bowel movement, -difficulty swallowing or eating Hematology: -bleeding, -bruising  Musculoskeletal: -joint aches, -muscle aches, -joint swelling, -back pain, -neck pain, -cramping, -changes in gait Ophthalmology: denies vision changes, eye redness, itching, discharge Urology: -burning with urination, -difficulty urinating, -blood in urine, -urinary frequency, -urgency, -incontinence Neurology: -headache, -weakness, -tingling, -numbness, -memory loss, -falls, -dizziness Psychology: -depressed mood, -agitation, -sleep problems Male GU: no testicular mass, pain, no lymph nodes swollen, no swelling, no rash.     Objective:  BP (!) 152/90   Pulse 80   Temp 97.6 F (36.4 C)   Ht 5\' 10"  (1.778 m)   Wt 217 lb 9.6 oz (98.7 kg)   SpO2 96%   BMI 31.22 kg/m   General appearance: alert, no distress, WD/WN, Caucasian male Skin: scattered macules, no worrisome lesions HEENT: normocephalic, conjunctiva/corneas normal, sclerae anicteric, PERRLA, EOMi, nares patent, no discharge or erythema, pharynx normal Oral cavity: MMM, tongue normal, teeth normal Neck: supple, no lymphadenopathy, no thyromegaly, no masses, normal ROM, no  bruits Chest: non tender, normal shape and expansion Heart: RRR, normal S1, S2, no murmurs Lungs: CTA bilaterally, no wheezes, rhonchi, or rales Abdomen: +bs, soft, non tender, non distended, no masses, no hepatomegaly, no splenomegaly, no bruits Back: non tender, normal ROM, no scoliosis Musculoskeletal: upper extremities non tender, no obvious deformity, normal ROM throughout, lower extremities non tender, no obvious deformity, normal ROM throughout Extremities: no edema, no cyanosis, no clubbing Pulses: 2+ symmetric, upper and lower extremities, normal cap refill Neurological: alert, oriented x 3, CN2-12 intact, strength normal upper extremities and lower extremities, sensation normal throughout, DTRs 2+ throughout, no cerebellar signs, gait normal Psychiatric: normal affect, behavior normal, pleasant  GU: normal male external genitalia,circumcised, nontender, no masses, no hernia, no lymphadenopathy Rectal: anus normal tone, prosate WNL, no nodules   EKG indication HTN and premature CAD, nsr, rate 69 bpm, PR interval 162ms, QRS 86ms, QTC 424ms, 58 degrees, abnormal EKG    Assessment and Plan :   Encounter Diagnoses  Name Primary?  . Encounter for health maintenance examination in adult Yes  . Type 2 diabetes mellitus with hyperglycemia, with long-term current use of insulin (HCC)   . Essential hypertension, benign   . Obstructive sleep apnea syndrome   . Severe obstructive sleep apnea   . Depressed mood   . Obesity, unspecified classification, unspecified obesity type, unspecified whether serious comorbidity present   . Vaccine counseling   . Family history of melanoma   . Low testosterone   . Hyperlipidemia, unspecified hyperlipidemia type   . Family history of premature CAD   . Sacroiliac joint dysfunction   . Hypogonadism in male   . Screening for prostate cancer   . Screen for STD (sexually transmitted disease)   . Insomnia, unspecified type   . Prostate enlargement      Physical exam - discussed and counseled on healthy lifestyle, diet, exercise, preventative care, vaccinations, sick and well care, proper use of emergency dept and after hours care, and addressed their concerns.    Health screening: See your eye doctor yearly for routine vision care. See your dentist yearly for routine  dental care including hygiene visits twice yearly.  Discussed STD testing, discussed prevention, condom use, means of transmission   Cancer screening Colonoscopy:  Reviewed colonoscopy on file that is up to date  Discussed skin surveillance  Discussed PSA, prostate exam, and prostate cancer screening risks/benefits.       Vaccinations: Advised yearly influenza vaccine Counseled on shingrix and covid vaccine.   He will check insurance coverage for these    Separate significant issues discussed: Labs today  Hyperlipidemia-c/t Lipitor 20 mg daily  Hypertension- increase carvedilol to 12.5 mg twice daily, stop lisinopril 40 mg daily and stop verapamil 120 mg SR daily.  Change to Trandolapril Verapamil combo.   Continue  hydralazine 25 mg, 2 tablets twice daily  Low testosterone-c/t testosterone injections every 2 weeks  Diabetes - c/t Jardiance 25 mg daily, Lantus 75 units daily, counseled on diet, exercise, glucose monitoring, f/u pending labs  BPH-c/t Flomax 0.4 mg daily  Depression and Chronic pain - not tolerating Cymbalta 30 mg daily due to sexual side effects.   Change to trial of Trintellix as discussed.  Gave samples of 5mg  and 10mg , begin 5mg  x 1 wk, then increase to 10mg  daily ,wean down off Cymbalta.  Insomnia -  C/t Doxepin 10 mg daily at bedtime   [name] [name] was seen today for medication management and sexually transmitted disease.  Diagnoses and all orders for this visit:  Encounter for health maintenance examination in adult -     Hemoglobin A1c -     Comprehensive metabolic panel -     Lipid panel -     Microalbumin/Creatinine Ratio,  Urine -     PSA -     Testosterone -     HIV Antibody (routine testing w rflx) -     RPR -     GC/Chlamydia Probe Amp -     Hepatitis C antibody -     Hepatitis B surface antigen -     CBC with Differential/Platelet -     EKG 12-Lead  Type 2 diabetes mellitus with hyperglycemia, with long-term current use of insulin (HCC) -     Hemoglobin A1c -     Comprehensive metabolic panel -     Microalbumin/Creatinine Ratio, Urine  Essential hypertension, benign -     EKG 12-Lead -     Ambulatory referral to Cardiology  Obstructive sleep apnea syndrome -     EKG 12-Lead -     Ambulatory referral to Cardiology  Severe obstructive sleep apnea  Depressed mood  Obesity, unspecified classification, unspecified obesity type, unspecified whether serious comorbidity present  Vaccine counseling  Family history of melanoma  Low testosterone  Hyperlipidemia, unspecified hyperlipidemia type -     Lipid panel -     Ambulatory referral to Cardiology  Family history of premature CAD -     Ambulatory referral to Cardiology  Sacroiliac joint dysfunction  Hypogonadism in male -     Testosterone  Screening for prostate cancer -     PSA  Screen for STD (sexually transmitted disease) -     HIV Antibody (routine testing w rflx) -     RPR -     GC/Chlamydia Probe Amp -     Hepatitis C antibody -     Hepatitis B surface antigen  Insomnia, unspecified type  Prostate enlargement  Other orders -     doxepin (SINEQUAN) 10 MG capsule; Take 1 capsule (10 mg total) by mouth at bedtime. -  testosterone cypionate (DEPOTESTOSTERONE CYPIONATE) 200 MG/ML injection; Inject 1 mL (200 mg total) into the muscle every 14 (fourteen) days. -     vortioxetine HBr (TRINTELLIX) 5 MG TABS tablet; Take 1 tablet (5 mg total) by mouth daily. -     carvedilol (COREG) 12.5 MG tablet; Take 1 tablet (12.5 mg total) by mouth 2 (two) times daily with a meal. -     trandolapril-verapamil (TARKA) 2-180 MG tablet;  Take 1 tablet by mouth daily.    Follow-up pending labs, yearly for physical

## 2020-01-30 NOTE — Patient Instructions (Signed)
Depression   Begin Trintellix 5mg , 1 tablet daily until you run out, then increase to 10mg  daily.  Take Cymbalta every other day for the next 2 weeks , then STOP Cymbalta   High Blood pressure  INCREASE coreg/carvedilol to 12.5mg  twice daily, continue Hydralazine 2 tablets twice daily, and I am going to change to a combo drug that will take the place of Lisinopril AND Verapamil.      Continue to monitor blood pressures  We will refer you to cardiology   we will call with lab results and other recommendations    Preventative Care for Adults - Male    Thank you for coming in for your well visit today, and thank you for trusting [place] with your care!   Maintain regular health and wellness exams:  A routine yearly physical is a good way to check in with your primary care provider about your health and preventive screening. It is also an opportunity to share updates about your health and any concerns you have, and receive a thorough all-over exam.   Most health insurance companies pay for at least some preventative services.  Check with your health plan for specific coverages.  What preventative services do men need?  Adult men should have their weight and blood pressure checked regularly.   Men age 39 and older should have their cholesterol levels checked regularly.  Beginning at age 55 and continuing to age 25, men should be screened for colorectal cancer.  Certain people may need continued testing until age 44.  Updating vaccinations is part of preventative care.  Vaccinations help protect against diseases such as the flu.  Osteoporosis is a disease in which the bones lose minerals and strength as we age. Men ages 91 and over should discuss this with their caregivers  Lab tests are generally done as part of preventative care to screen for anemia and blood disorders, to screen for problems with the kidneys and liver, to screen for bladder problems, to check blood sugar, and to  check your cholesterol level.  Preventative services generally include counseling about diet, exercise, avoiding tobacco, drugs, excessive alcohol consumption, and sexually transmitted infections.   Xrays and CT scans are not normally done as a preventative test, and most insurances do not pay for imaging for screening other than as discussed under cancer screens below.   On the other hand, if you have certain medical concerns, imaging may be necessary as a diagnostic test.    Your Medical Team Your medical team starts with [place], your PCP or primary care provider.  Please use our services for your routine care such as physicals, screenings, immunizations, sick visits, and your first stop for general medical concerns.  You can call our number for after hours information for urgent questions that may need attention but cannot wait til the next business day.    Urgent care-urgent cares exist to provide care when your primary care office would typically be closed such as evenings or weekends.   Urgent care is for evaluation of urgent medical problems that do not necessarily require emergency department care, but cannot wait til the next business day when we are open.  Emergency department care-please reserve emergency department care for serious, urgent, possibly life-threatening medical problems.  This includes issues like possible stroke, heart attack, significant injury, mental health crisis, or other urgent need that requires immediate medical attention.     See your dentist office twice yearly for hygiene and cleaning visits.  Brush your teeth and floss your teeth daily.  See your eye doctor yearly for routine eye exam and screenings for glaucoma and retinal disease.   Vaccines:  Stay up to date with your tetanus shots and other required immunizations. You should have a booster for tetanus every 10 years. Be sure to get your flu shot every year, since 5%-20% of the [place] population comes down  with the flu. The flu vaccine changes each year, so being vaccinated once is not enough. Get your shot in the fall, before the flu season peaks.   Other vaccines to consider:  Pneumococcal vaccine to protect against certain types of pneumonia.  This is normally recommended for adults age 83 or older.  However, adults younger than 58 years old with certain underlying conditions such as diabetes, heart or lung disease should also receive the vaccine.  Shingles vaccine to protect against Varicella Zoster if you are older than age 47, or younger than 57 years old with certain underlying illness.  If you have not had the Shingrix vaccine, please call your insurer to inquire about coverage for the Shingrix vaccine given in 2 doses.   Some insurers cover this vaccine after age 40, some cover this after age 72.  If your insurer covers this, then call to schedule appointment to have this vaccine here  Hepatitis A vaccine to protect against a form of infection of the liver by a virus acquired from food.  Hepatitis B vaccine to protect against a form of infection of the liver by a virus acquired from blood or body fluids, particularly if you work in health care.  If you plan to travel internationally, check with your local health department for specific vaccination recommendations.  Human Papilloma Virus or HPV causes cancer of the cervix, and other infections that can be transmitted from person to person. There is a vaccine for HPV, and males should get immunized between the ages of 67 and 1. It requires a series of 3 shots.   Covid/Coronavirus - as the vaccines are becoming available, please consider vaccination if you are a health care worker, first responder, or have significant health problems such as asthma, COPD, heart disease, hypertension, diabetes, obesity, multiple medical problems, over age 51yo, or immunocompromised.     What should I know about Cancer screening? Many types of cancers can be  detected early and may often be prevented. Lung Cancer  You should be screened every year for lung cancer if: ? You are a current smoker who has smoked for at least 30 years. ? You are a former smoker who has quit within the past 15 years.  Talk to your health care provider about your screening options, when you should start screening, and how often you should be screened.  Colorectal Cancer  Routine colorectal cancer screening usually begins at 57 years of age and should be repeated every 5-10 years until you are 57 years old. You may need to be screened more often if early forms of precancerous polyps or small growths are found. Your health care provider may recommend screening at an earlier age if you have risk factors for colon cancer.  Your health care provider may recommend using home test kits to check for hidden blood in the stool.  A small camera at the end of a tube can be used to examine your colon (sigmoidoscopy or colonoscopy). This checks for the earliest forms of colorectal cancer.  Prostate and Testicular Cancer  Depending on your age  and overall health, your health care provider may do certain tests to screen for prostate and testicular cancer.  Talk to your health care provider about any symptoms or concerns you have about testicular or prostate cancer.  Skin Cancer  Check your skin from head to toe regularly.  Tell your health care provider about any new moles or changes in moles, especially if: ? There is a change in a mole's size, shape, or color. ? You have a mole that is larger than a pencil eraser.  Always use sunscreen. Apply sunscreen liberally and repeat throughout the day.  Protect yourself by wearing long sleeves, pants, a wide-brimmed hat, and sunglasses when outside.     GENERAL RECOMMENDATIONS FOR GOOD HEALTH:  Healthy diet:  Eat a variety of foods, including fruit, vegetables, animal or vegetable protein, such as meat, fish, chicken, and eggs,  or beans, lentils, tofu, and grains, such as rice.  Drink plenty of water daily.  Decrease saturated fat in the diet, avoid lots of red meat, processed foods, sweets, fast foods, and fried foods.  Exercise:  Aerobic exercise helps maintain good heart health. At least 30-40 minutes of moderate-intensity exercise is recommended. For example, a brisk walk that increases your heart rate and breathing. This should be done on most days of the week.   Find a type of exercise or a variety of exercises that you enjoy so that it becomes a part of your daily life.  Examples are running, walking, swimming, water aerobics, and biking.  For motivation and support, explore group exercise such as aerobic class, spin class, Zumba, Yoga,or  martial arts, etc.    Set exercise goals for yourself, such as a certain weight goal, walk or run in a race such as a 5k walk/run.  Speak to your primary care provider about exercise goals.  Your weight readings per our records: Wt Readings from Last 3 Encounters:  01/30/20 236 lb 9.6 oz (107.3 kg)  07/12/18 221 lb 3.2 oz (100.3 kg)  05/12/18 236 lb (107 kg)    Body mass index is 34.94 kg/m.    Disease prevention:  If you smoke or chew tobacco, find out from your caregiver how to quit. It can literally save your life, no matter how long you have been a tobacco user. If you do not use tobacco, never begin.   Maintain a healthy diet and normal weight. Increased weight leads to problems with blood pressure and diabetes.   The Body Mass Index or BMI is a way of measuring how much of your body is fat. Having a BMI above 27 increases the risk of heart disease, diabetes, hypertension, stroke and other problems related to obesity. Your caregiver can help determine your BMI and based on it develop an exercise and dietary program to help you achieve or maintain this important measurement at a healthful level.  High blood pressure causes heart and blood vessel problems.   Persistent high blood pressure should be treated with medicine if weight loss and exercise do not work.  Your blood pressure readings per our records:     BP Readings from Last 3 Encounters:  01/30/20 (!) 144/82  07/12/18 130/82  05/12/18 128/78     Fat and cholesterol leaves deposits in your arteries that can block them. This causes heart disease and vessel disease elsewhere in your body.  If your cholesterol is found to be high, or if you have heart disease or certain other medical conditions, then you may need  to have your cholesterol monitored frequently and be treated with medication.   Ask if you should have a cardiac stress test if your history suggests this. A stress test is a test done on a treadmill that looks for heart disease. This test can find disease prior to there being a problem.       Osteoporosis is a disease in which the bones lose minerals and strength as we age. This can result in serious bone fractures. Risk of osteoporosis can be identified using a bone density scan. Men ages 76 and over should discuss this with their caregivers. Ask your caregiver whether you should be taking a calcium supplement and Vitamin D, to reduce the rate of osteoporosis.   Avoid drinking alcohol in excess (more than two drinks per day).  Avoid use of street drugs. Do not share needles with anyone. Ask for professional help if you need assistance or instructions on stopping the use of alcohol, cigarettes, and/or drugs.  Brush your teeth twice a day with fluoride toothpaste, and floss once a day. Good oral hygiene prevents tooth decay and gum disease. The problems can be painful, unattractive, and can cause other health problems. Visit your dentist for a routine oral and dental check up and preventive care every 6-12 months.   Safety:  Use seatbelts 100% of the time, whether driving or as a passenger.  Use safety devices such as hearing protection if you work in environments with loud noise or  significant background noise.  Use safety glasses when doing any work that could send debris in to the eyes.  Use a helmet if you ride a bike or motorcycle.  Use appropriate safety gear for contact sports.  Talk to your caregiver about gun safety.  Use sunscreen with a SPF (or skin protection factor) of 15 or greater.  Lighter skinned people are at a greater risk of skin cancer. Don't forget to also wear sunglasses in order to protect your eyes from too much damaging sunlight. Damaging sunlight can accelerate cataract formation.   Keep carbon monoxide and smoke detectors in your home functioning at all times. Change the batteries every 6 months or use a model that plugs into the wall.    Sexual activity: . Sex is a normal part of life and sexual activity can continue into older adulthood for many healthy people.   . If you are having erectile dysfunction issues, please follow up to discuss this further.   . If you are not in a monogamous relationship or have more than one partner, please practice safe sex.  Use condoms. Condoms are used for birth control and to help reduce the spread of sexually transmitted infections (or STIs).  Some of the STIs are gonorrhea (the clap), chlamydia, syphilis, trichomonas, herpes, HPV (human papilloma virus) and HIV (human immunodeficiency virus) which causes AIDS. The herpes, HIV and HPV are viral illnesses that have no cure. These can result in disability, cancer and death.   We are able to test for STIs here at our office

## 2020-01-31 LAB — LIPID PANEL
Chol/HDL Ratio: 5.1 ratio — ABNORMAL HIGH (ref 0.0–5.0)
Cholesterol, Total: 147 mg/dL (ref 100–199)
HDL: 29 mg/dL — ABNORMAL LOW (ref >39)
LDL Chol Calc (NIH): 87 mg/dL (ref 0–99)
Triglycerides: 180 mg/dL — ABNORMAL HIGH (ref 0–149)
VLDL Cholesterol Cal: 31 mg/dL (ref 5–40)

## 2020-01-31 LAB — CBC WITH DIFFERENTIAL/PLATELET
Basophils Absolute: 0.1 10*3/uL (ref 0.0–0.2)
Basos: 1 %
EOS (ABSOLUTE): 0.4 10*3/uL (ref 0.0–0.4)
Eos: 5 %
Hematocrit: 55.4 % — ABNORMAL HIGH (ref 37.5–51.0)
Hemoglobin: 18.5 g/dL — ABNORMAL HIGH (ref 13.0–17.7)
Immature Grans (Abs): 0 10*3/uL (ref 0.0–0.1)
Immature Granulocytes: 0 %
Lymphocytes Absolute: 2.2 10*3/uL (ref 0.7–3.1)
Lymphs: 28 %
MCH: 28.3 pg (ref 26.6–33.0)
MCHC: 33.4 g/dL (ref 31.5–35.7)
MCV: 85 fL (ref 79–97)
Monocytes Absolute: 0.6 10*3/uL (ref 0.1–0.9)
Monocytes: 8 %
Neutrophils Absolute: 4.7 10*3/uL (ref 1.4–7.0)
Neutrophils: 58 %
Platelets: 240 10*3/uL (ref 150–450)
RBC: 6.54 x10E6/uL — ABNORMAL HIGH (ref 4.14–5.80)
RDW: 15.3 % (ref 11.6–15.4)
WBC: 7.9 10*3/uL (ref 3.4–10.8)

## 2020-01-31 LAB — COMPREHENSIVE METABOLIC PANEL
ALT: 26 IU/L (ref 0–44)
AST: 24 IU/L (ref 0–40)
Albumin/Globulin Ratio: 1.8 (ref 1.2–2.2)
Albumin: 4.5 g/dL (ref 3.8–4.9)
Alkaline Phosphatase: 76 IU/L (ref 39–117)
BUN/Creatinine Ratio: 18 (ref 9–20)
BUN: 20 mg/dL (ref 6–24)
Bilirubin Total: 0.4 mg/dL (ref 0.0–1.2)
CO2: 23 mmol/L (ref 20–29)
Calcium: 10 mg/dL (ref 8.7–10.2)
Chloride: 107 mmol/L — ABNORMAL HIGH (ref 96–106)
Creatinine, Ser: 1.14 mg/dL (ref 0.76–1.27)
GFR calc Af Amer: 83 mL/min/{1.73_m2} (ref >59)
GFR calc non Af Amer: 71 mL/min/{1.73_m2} (ref >59)
Globulin, Total: 2.5 g/dL (ref 1.5–4.5)
Glucose: 109 mg/dL — ABNORMAL HIGH (ref 65–99)
Potassium: 4.6 mmol/L (ref 3.5–5.2)
Sodium: 145 mmol/L — ABNORMAL HIGH (ref 134–144)
Total Protein: 7 g/dL (ref 6.0–8.5)

## 2020-01-31 LAB — RPR: RPR Ser Ql: NONREACTIVE

## 2020-01-31 LAB — HEMOGLOBIN A1C
Est. average glucose Bld gHb Est-mCnc: 194 mg/dL
Hgb A1c MFr Bld: 8.4 % — ABNORMAL HIGH (ref 4.8–5.6)

## 2020-01-31 LAB — MICROALBUMIN / CREATININE URINE RATIO
Creatinine, Urine: 150.3 mg/dL
Microalb/Creat Ratio: 5 mg/g creat (ref 0–29)
Microalbumin, Urine: 7 ug/mL

## 2020-01-31 LAB — GC/CHLAMYDIA PROBE AMP
Chlamydia trachomatis, NAA: NEGATIVE
Neisseria Gonorrhoeae by PCR: NEGATIVE

## 2020-01-31 LAB — HIV ANTIBODY (ROUTINE TESTING W REFLEX): HIV Screen 4th Generation wRfx: NONREACTIVE

## 2020-01-31 LAB — PSA: Prostate Specific Ag, Serum: 0.6 ng/mL (ref 0.0–4.0)

## 2020-01-31 LAB — HEPATITIS C ANTIBODY: Hep C Virus Ab: <0.1 s/co ratio (ref 0.0–0.9)

## 2020-01-31 LAB — TESTOSTERONE: Testosterone: 146 ng/dL — ABNORMAL LOW (ref 264–916)

## 2020-01-31 LAB — HEPATITIS B SURFACE ANTIGEN: Hepatitis B Surface Ag: NEGATIVE

## 2020-02-13 ENCOUNTER — Ambulatory Visit: Payer: BLUE CROSS/BLUE SHIELD | Admitting: Cardiology

## 2020-02-13 ENCOUNTER — Ambulatory Visit: Payer: BC Managed Care – PPO | Admitting: Cardiology

## 2020-02-13 ENCOUNTER — Telehealth: Payer: Self-pay

## 2020-02-13 ENCOUNTER — Other Ambulatory Visit: Payer: Self-pay

## 2020-02-13 ENCOUNTER — Encounter: Payer: Self-pay | Admitting: Cardiology

## 2020-02-13 VITALS — BP 160/80 | HR 83 | Temp 96.7°F | Resp 16 | Ht 70.0 in | Wt 217.6 lb

## 2020-02-13 DIAGNOSIS — E1122 Type 2 diabetes mellitus with diabetic chronic kidney disease: Secondary | ICD-10-CM

## 2020-02-13 DIAGNOSIS — Z87891 Personal history of nicotine dependence: Secondary | ICD-10-CM

## 2020-02-13 DIAGNOSIS — E1169 Type 2 diabetes mellitus with other specified complication: Secondary | ICD-10-CM

## 2020-02-13 DIAGNOSIS — E782 Mixed hyperlipidemia: Secondary | ICD-10-CM | POA: Diagnosis not present

## 2020-02-13 DIAGNOSIS — I10 Essential (primary) hypertension: Secondary | ICD-10-CM | POA: Diagnosis not present

## 2020-02-13 DIAGNOSIS — N182 Chronic kidney disease, stage 2 (mild): Secondary | ICD-10-CM

## 2020-02-13 DIAGNOSIS — G4733 Obstructive sleep apnea (adult) (pediatric): Secondary | ICD-10-CM

## 2020-02-13 DIAGNOSIS — R9431 Abnormal electrocardiogram [ECG] [EKG]: Secondary | ICD-10-CM

## 2020-02-13 MED ORDER — ATORVASTATIN CALCIUM 40 MG PO TABS: 40.0000 mg | ORAL_TABLET | Freq: Every day | ORAL | 1 refills | Status: DC

## 2020-02-13 MED ORDER — HYDRALAZINE HCL 25 MG PO TABS: 25.0000 mg | ORAL_TABLET | Freq: Three times a day (TID) | ORAL | 1 refills | Status: DC

## 2020-02-13 MED ORDER — CARVEDILOL 6.25 MG PO TABS: 6.2500 mg | ORAL_TABLET | Freq: Two times a day (BID) | ORAL | 1 refills | Status: DC

## 2020-02-13 MED ORDER — ATORVASTATIN CALCIUM 40 MG PO TABS: 20.0000 mg | ORAL_TABLET | Freq: Every day | ORAL | 1 refills | Status: DC

## 2020-02-13 MED ORDER — AMLODIPINE BESYLATE 10 MG PO TABS: 10.0000 mg | ORAL_TABLET | Freq: Every day | ORAL | 1 refills | Status: DC

## 2020-02-13 NOTE — Patient Instructions (Addendum)
Please remember to bring in your medication bottles in at the next visit.   New Medications that were added or changed at today's visit:  Coreg 6.25mg  po bid (will refill) Hydralazine 25mg  po tid Norvasc 10mg  po qday.  Atorvastatin 40mg  po qhs   Medications that were discontinued at today's visit: Trandoapril  Office will call you to have the following tests scheduled:  Echo Stress Test  Recommend follow up with your PCP as scheduled.

## 2020-02-13 NOTE — Telephone Encounter (Signed)
I will gladly follow up, thank you!

## 2020-02-13 NOTE — Progress Notes (Signed)
REASON FOR CONSULT:  I10 (ICD-10-CM) - Essential hypertension, benign G47.33 (ICD-10-CM) - Obstructive sleep apnea syndrome E78.5 (ICD-10-CM) - Hyperlipidemia, unspecified hyperlipidemia type Z82.49 (ICD-10-CM) - Family history of premature CAD  Chief Complaint  Patient presents with  . Hypertension  . Hyperlipidemia  . New Patient (Initial Visit)    Hx of CAD    REQUESTING PHYSICIAN:  Yaniel, Limbaugh, PA-C [address] [place],  [place] [address]  HPI  [name] [name] is a 59 y.o. male who presents to the office with a chief complaint of " elevated blood pressures, cholesterol management, and family history of heart disease."  Patient's past medical history and cardiac risk factors include: Insulin-dependent diabetes mellitus type 2, hypertension with chronic kidney disease, mixed hyperlipidemia, family history of premature coronary artery disease, sleep apnea.  Patient is unaccompanied at today's office visit.   Hypertension: Patient states that he was diagnosed with high blood pressure back in 2003.  He has been on medication and initially was well controlled however it has been difficult to control his systolic blood pressures recently.  Patient states that he tries to check his blood pressure twice a day.  However since February he is unable to keep a log of his blood pressures as the machine is no longer sinking with the phone.  However review of the blood pressure readings in January 2021 illustrate that his systolic blood pressures range between 140 mmHg to 160 mmHg.  Patient predominately takes medications in the morning and at night.  His diet consist of high salt intake from takeout and he does not cook significant mount of his meals at home.  Patient states that in the past his blood pressures have been as high as 190 mmHg during which he did experience headaches.  Patient did not bring his medication bottles in at today's office visit.  Medication list illustrates  that he is currently on 2 ACE inhibitors.  He is currently on lisinopril 40 mg p.o. daily and trandolapril.  Patient is requested to bring his medication bottles on every visit however he appears to be reluctant with this recommendation.  He also has run out of his Coreg 6.25 mg p.o. twice daily for the last several weeks.  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  He is complaining of being tired and fatigue however his home sleep study did not show significant apneic episodes according to the patient.  Hyperlipidemia: Most recent lipid profile reviewed with the patient.  LDL currently not at goal given his underlying diabetes mellitus.  Patient is currently on Lipitor 20 mg p.o. nightly and tolerating medication well.  Premature coronary artery disease in the family: Her brother passed away at the age of 60 from myocardial infarction.  Patient states that he thought he had a coronary event back in 2008 and had undergone a [facility] heart [facility]  Records unavailable for review. EKG 02/13/2020: Normal sinus rhythm with a ventricular rate of 76 bpm, normal axis, cannot rule out old anterior infarct, poor R wave progression, no underlying injury pattern.  Review of systems positive for: tiredness, fatigue.  Currently patient denies chest pain, shortness of breath at rest or effort related symptoms, lightheadedness, dizziness, palpitations, orthopnea, paroxysmal nocturnal dyspnea, lower extremity swelling, near syncope, syncopal events, hematochezia, hemoptysis, hematemesis, melanotic stools, no symptoms of amaurosis fugax, motor or sensory symptoms or dysphasia in the last 6 months.   History of family history of  premature coronary artery disease with brother passing away at age of 24.  Denies prior history of  myocardial infarction, congestive heart failure, deep venous thrombosis, pulmonary embolism, stroke, transient ischemic  attack.  FUNCTIONAL STATUS: Does daily activities without limitations. But no exercise program.   ALLERGIES: Allergies  Allergen Reactions  . Pravastatin Other (See Comments)    Joint myalgias  . Hydrochlorothiazide     lightheadedness  . Metformin And Related     diarrhea   MEDICATION LIST PRIOR TO VISIT: Current Outpatient Medications on File Prior to Visit  Medication Sig Dispense Refill  . doxepin (SINEQUAN) 10 MG capsule Take 1 capsule (10 mg total) by mouth at bedtime. 90 capsule 1  . insulin glargine (LANTUS) 100 UNIT/ML injection Inject 0.8 mLs (80 Units total) into the skin daily. 3 mo supply (Patient taking differently: Inject 75 Units into the skin daily. 3 mo supply) 80 mL 3  . Insulin Pen Needle (BD PEN NEEDLE NANO U/F) 32G X 4 MM MISC 1 each by Does not apply route at bedtime. 100 each 11  . JARDIANCE 25 MG TABS tablet TAKE 1 TABLET BY MOUTH  DAILY 90 tablet 3  . lisinopril (ZESTRIL) 40 MG tablet Take 40 mg by mouth daily.    . Needles & Syringes MISC As directed 8ml of testosterone every 14 days 100 each 5  . tamsulosin (FLOMAX) 0.4 MG CAPS capsule TAKE 1 CAPSULE BY MOUTH  DAILY 90 capsule 3  . testosterone cypionate (DEPOTESTOSTERONE CYPIONATE) 200 MG/ML injection Inject 1 mL (200 mg total) into the muscle every 14 (fourteen) days. 30 mL 3  . verapamil (CALAN) 120 MG tablet Take 120 mg by mouth once.     . vortioxetine HBr (TRINTELLIX) 5 MG TABS tablet Take 1 tablet (5 mg total) by mouth daily. (Patient taking differently: Take 10 mg by mouth daily. ) 30 tablet 0   No current facility-administered medications on file prior to visit.    PAST MEDICAL HISTORY: Past Medical History:  Diagnosis Date  . Anxiety   . Depression   . Diabetes mellitus    Type 2  . Enlarged prostate   . Environmental allergies   . Environmental and seasonal allergies   . GERD (gastroesophageal reflux disease)   . Headache(784.0)    sinus and migraines  . Hyperlipidemia   .  Hypertension   . IBS (irritable bowel syndrome)   . Low testosterone   . Sleep apnea    has used BiPAP in past; last sleep study >10 years    PAST SURGICAL HISTORY: The patient  Past Surgical History:  Procedure Laterality Date  . COLONOSCOPY  09/2018   normal, repeat in 10 years per patient.  [place], [place]  . KNEE ARTHROSCOPY Left   . SACROILIAC JOINT FUSION Right 05/02/2014   Procedure: SACROILIAC JOINT FUSION;  Surgeon: Sinclair Ship, MD;  Location: [facility];  Service: Orthopedics;  Laterality: Right;  Right sided sacroiliac joint fusion  . TONSILLECTOMY     uvula removed    FAMILY HISTORY: The patient family history includes Cancer in his brother, father, and mother; Diabetes in his brother; Heart disease in his maternal grandmother, paternal grandfather, and paternal grandmother; Heart disease (age of onset: 81) in his brother; Lung disease in his maternal grandfather; Stroke in his paternal grandmother.   SOCIAL HISTORY:  The patient  reports that he quit smoking about 27 years ago. He has a 10.00 pack-year smoking history. He has never used smokeless tobacco.  He reports that he does not drink alcohol or use drugs.  14 ORGAN REVIEW OF SYSTEMS: CONSTITUTIONAL: No fever or significant weight loss EYES: No recent significant visual change EARS, NOSE, MOUTH, THROAT: No recent significant change in hearing CARDIOVASCULAR: See discussion in subjective/HPI RESPIRATORY: See discussion in subjective/HPI GASTROINTESTINAL: No recent complaints of abdominal pain GENITOURINARY: No recent significant change in genitourinary status MUSCULOSKELETAL: No recent significant change in musculoskeletal status INTEGUMENTARY: No recent rash NEUROLOGIC: No recent significant change in motor function PSYCHIATRIC: No recent significant change in mood ENDOCRINOLOGIC: No recent significant change in endocrine status HEMATOLOGIC/LYMPHATIC: No recent significant unexpected  bruising ALLERGIC/IMMUNOLOGIC: No recent unexplained allergic reaction  PHYSICAL EXAM: Blood pressure (!) 160/80, pulse 83, temperature (!) 96.7 F (35.9 C), temperature source Oral, resp. rate 16, height 5\' 10"  (1.778 m), weight 217 lb 9.6 oz (98.7 kg), SpO2 95 %.  Repeat blood pressure on the left arm was 160/90 mmHg. Body mass index is 31.22 kg/m. CONSTITUTIONAL: Well-developed and well-nourished. No acute distress.  SKIN: Skin is warm and dry. No rash noted. No cyanosis. No pallor. No jaundice HEAD: Normocephalic and atraumatic.  EYES: No scleral icterus MOUTH/THROAT: Moist oral membranes.  NECK: No JVD present. No thyromegaly noted. No carotid bruits  LYMPHATIC: No visible cervical adenopathy.  CHEST Normal respiratory effort. No intercostal retractions  LUNGS: Clear to auscultation bilaterally.  No stridor. No wheezes. No rales.  CARDIOVASCULAR: Regular rate and rhythm, positive S1-S2, no murmurs rubs or gallops appreciated. ABDOMINAL: Obese, soft, nontender, nondistended, positive bowel sounds in all 4 quadrants.  No apparent ascites.  EXTREMITIES: No peripheral edema  HEMATOLOGIC: No significant bruising NEUROLOGIC: Oriented to person, place, and time. Nonfocal. Normal muscle tone.  PSYCHIATRIC: Normal mood and affect. Normal behavior. Cooperative  CARDIAC DATABASE: EKG: 02/13/2020: Normal sinus rhythm with a ventricular rate of 76 bpm, normal axis, cannot rule out old anterior infarct, poor R wave progression, no underlying injury pattern.  Echocardiogram: None  Stress Testing: None  Heart Catheterization: [facility] at [facility]  LABORATORY DATA: CBC Latest Ref Rng & Units 01/30/2020 06/06/2019 03/26/2015  WBC 3.4 - 10.8 x10E3/uL 7.9 9.9 9.5  Hemoglobin 13.0 - 17.7 g/dL 18.5(H) 16.2 16.9  Hematocrit 37.5 - 51.0 % 55.4(H) 49.6 50.3  Platelets 150 - 450 x10E3/uL 240 276 270     CMP Latest Ref Rng & Units 01/30/2020 06/06/2019 03/26/2015  Glucose 65 - 99 mg/dL 03/28/2015) 88 767(H)   BUN 6 - 24 mg/dL 20 20 419(F)  Creatinine 0.76 - 1.27 mg/dL 79(K 2.40 9.73)  Sodium 134 - 144 mmol/L 145(H) 140 143  Potassium 3.5 - 5.2 mmol/L 4.6 4.6 4.3  Chloride 96 - 106 mmol/L 107(H) 103 105  CO2 20 - 29 mmol/L 23 23 28   Calcium 8.7 - 10.2 mg/dL 5.32(D 9.8 10.7(H)  Total Protein 6.0 - 8.5 g/dL 7.0 7.0 -  Total Bilirubin 0.0 - 1.2 mg/dL 0.4 0.4 -  Alkaline Phos 39 - 117 IU/L 76 70 -  AST 0 - 40 IU/L 24 22 -  ALT 0 - 44 IU/L 26 22 -     Lipid Panel     Component Value Date/Time   CHOL 147 01/30/2020 1223   TRIG 180 (H) 01/30/2020 1223   HDL 29 (L) 01/30/2020 1223   CHOLHDL 5.1 (H) 01/30/2020 1223   CHOLHDL 6.6 Ratio 06/17/2010 2242   VLDL 60 (H) 06/17/2010 2242   LDLCALC 87 01/30/2020 1223   LDLDIRECT 93 08/20/2009 2019   LABVLDL 31 01/30/2020 1223  FINAL MEDICATION LIST END OF ENCOUNTER: Meds ordered this encounter  Medications  . amLODipine (NORVASC) 10 MG tablet    Sig: Take 1 tablet (10 mg total) by mouth daily.    Dispense:  90 tablet    Refill:  1  . DISCONTD: atorvastatin (LIPITOR) 40 MG tablet    Sig: Take 0.5 tablets (20 mg total) by mouth at bedtime.    Dispense:  90 tablet    Refill:  1    Requesting 1 year supply  . carvedilol (COREG) 6.25 MG tablet    Sig: Take 1 tablet (6.25 mg total) by mouth 2 (two) times daily with a meal.    Dispense:  180 tablet    Refill:  1  . hydrALAZINE (APRESOLINE) 25 MG tablet    Sig: Take 1 tablet (25 mg total) by mouth 3 (three) times daily.    Dispense:  270 tablet    Refill:  1    Requesting 1 year supply  . atorvastatin (LIPITOR) 40 MG tablet    Sig: Take 1 tablet (40 mg total) by mouth at bedtime.    Dispense:  90 tablet    Refill:  1    Requesting 1 year supply    Medications Discontinued During This Encounter  Medication Reason  . trandolapril-verapamil (TARKA) 2-180 MG tablet Error  . carvedilol (COREG) 12.5 MG tablet Change in therapy  . trandolapril (MAVIK) 2 MG tablet Duplicate  . atorvastatin  (LIPITOR) 20 MG tablet   . hydrALAZINE (APRESOLINE) 25 MG tablet   . carvedilol (COREG) 6.25 MG tablet Reorder  . atorvastatin (LIPITOR) 40 MG tablet      IMPRESSION:    ICD-10-CM   1. Type 2 DM with CKD stage 2 and hypertension (HCC)  E11.22 EKG 12-Lead   I12.9 PCV ECHOCARDIOGRAM COMPLETE   N18.2 PCV MYOCARDIAL PERFUSION WITH LEXISCAN    amLODipine (NORVASC) 10 MG tablet    carvedilol (COREG) 6.25 MG tablet    hydrALAZINE (APRESOLINE) 25 MG tablet  2. Essential hypertension, benign  I10   3. Type 2 diabetes mellitus with stage 2 chronic kidney disease, with long-term current use of insulin (HCC)  E11.22 EKG 12-Lead   N18.2    Z79.4   4. DM type 2 with diabetic mixed hyperlipidemia (HCC)  E11.69 atorvastatin (LIPITOR) 40 MG tablet   E78.2 DISCONTINUED: atorvastatin (LIPITOR) 40 MG tablet  5. Mixed hyperlipidemia  E78.2 atorvastatin (LIPITOR) 40 MG tablet    DISCONTINUED: atorvastatin (LIPITOR) 40 MG tablet  6. OSA (obstructive sleep apnea)  G47.33   7. Former smoker  Z87.891   8. Nonspecific abnormal electrocardiogram (ECG) (EKG)  R94.31 PCV ECHOCARDIOGRAM COMPLETE    PCV MYOCARDIAL PERFUSION WITH LEXISCAN    RECOMMENDATIONS:  Hypertension with insulin-dependent diabetes mellitus type 2 and chronic kidney disease stage II:  Systolic blood pressure is currently not at goal.  Discontinue trandolapril as the patient is currently on lisinopril 40 mg p.o. daily.  We will add Norvasc 10 mg p.o. every morning instead.  We will refill Coreg to 25 mg p.o. twice daily as the patient has ran out of this medication.  Hydralazine will be changed to 25 mg p.o. 3 times daily.  Had a discussion with the patient in regards to the addition of Isordil however, patient does take ED medication on a as needed basis.  Patient is asked to keep a log of his blood pressures and to bring it in at the next office visit.  Low-salt diet recommended.  Patient is encouraged to extubate in 30 minutes of  moderate intensity activity 5 days a week.  Labs reviewed.  Insulin-dependent diabetes mellitus type 2 with hyperlipidemia:  Increase Lipitor to 40 mg p.o. nightly.  Patient is 10-year risk of ASCVD based on demographics, total cholesterol, HDL, systolic blood pressure in consult to 21.5%.  If patient is intolerant to the increase in statin therapy will consider the addition of Zetia at the next office visit.  Dietary recommendations encouraged.  Family history of premature coronary artery disease:  Given the patient's cardiac risk factors, insulin-dependent diabetes mellitus type 2, and abnormal EKG recommend a cardiac work-up including an echocardiogram to evaluate for structural heart disease and stress test to rule out underlying ischemic burden.  Patient agreeable.  EKG reviewed and interpretation noted above.  Continue beta-blocker therapy.  Continue ACE inhibitor.    Continue statin.  Former smoker: Educated on the importance of continued smoking cessation.  Orders Placed This Encounter  Procedures  . PCV MYOCARDIAL PERFUSION WITH LEXISCAN  . EKG 12-Lead  . PCV ECHOCARDIOGRAM COMPLETE   --Continue cardiac medications as reconciled in final medication list. --Return in about 4 weeks (around 03/12/2020) for Discussion of test results and blood pressure readings. or sooner if needed. --Continue follow-up with your primary care physician regarding the management of your other chronic comorbid conditions.  Patient's questions and concerns were addressed to his satisfaction. He voices understanding of the instructions provided during this encounter.   This note was created using a voice recognition software as a result there may be grammatical errors inadvertently enclosed that do not reflect the nature of this encounter. Every attempt is made to correct such errors.  Tessa Lerner, DO, [facility]. [facility] Office: [phone]

## 2020-02-23 ENCOUNTER — Other Ambulatory Visit: Payer: Self-pay | Admitting: Medical

## 2020-02-23 ENCOUNTER — Telehealth: Payer: Self-pay

## 2020-02-23 MED ORDER — VORTIOXETINE HBR 5 MG PO TABS: 10.0000 mg | ORAL_TABLET | Freq: Every day | ORAL | 2 refills | Status: DC

## 2020-02-23 NOTE — Telephone Encounter (Signed)
Pt. Called said you gave him samples of trintellix last time he was here and he is almost out he wasn't sure if you wanted him to continue on the trintellix or not, if so he needs a prescription sent in or can pick up more samples, he also mentioned that he needs a refill on his Verapamil sent in to the [facility] on [address]. Pt. Last apt was 01/30/20.

## 2020-02-23 NOTE — Telephone Encounter (Signed)
Trintellix sent.   Have him contact cardiology about the verapamil.  I think they made some changes but I couldn't tell specifically what they wanted him to do about verapamil.

## 2020-02-26 NOTE — Telephone Encounter (Signed)
Pt. Aware that medication was sent in and to contact cardiology about the Verapamil.

## 2020-02-28 ENCOUNTER — Other Ambulatory Visit: Payer: Self-pay

## 2020-02-28 ENCOUNTER — Telehealth: Payer: Self-pay

## 2020-02-28 MED ORDER — VORTIOXETINE HBR 5 MG PO TABS: 10.0000 mg | ORAL_TABLET | Freq: Every day | ORAL | 2 refills | Status: DC

## 2020-02-28 NOTE — Telephone Encounter (Signed)
I called the pt. LM to let him know that I submitted a PA for the pts. Trintellix and it was approved from 02/27/20-02/26/21.

## 2020-02-29 ENCOUNTER — Other Ambulatory Visit: Payer: Self-pay

## 2020-02-29 DIAGNOSIS — R7989 Other specified abnormal findings of blood chemistry: Secondary | ICD-10-CM

## 2020-02-29 MED ORDER — DOXEPIN HCL 10 MG PO CAPS: 10.0000 mg | ORAL_CAPSULE | Freq: Every day | ORAL | 1 refills | Status: DC

## 2020-02-29 MED ORDER — NEEDLES & SYRINGES MISC: 5 refills | Status: DC

## 2020-02-29 MED ORDER — LISINOPRIL 40 MG PO TABS: 40.0000 mg | ORAL_TABLET | Freq: Every day | ORAL | 1 refills | Status: DC

## 2020-02-29 MED ORDER — INSULIN GLARGINE 100 UNIT/ML ~~LOC~~ SOLN: 80.0000 [IU] | Freq: Every day | SUBCUTANEOUS | 3 refills | Status: DC

## 2020-02-29 MED ORDER — JARDIANCE 25 MG PO TABS: 25.0000 mg | ORAL_TABLET | Freq: Every day | ORAL | 3 refills | Status: DC

## 2020-02-29 MED ORDER — VERAPAMIL HCL 120 MG PO TABS: 120.0000 mg | ORAL_TABLET | Freq: Once | ORAL | 1 refills | Status: DC

## 2020-02-29 MED ORDER — TAMSULOSIN HCL 0.4 MG PO CAPS: 0.4000 mg | ORAL_CAPSULE | Freq: Every day | ORAL | 3 refills | Status: DC

## 2020-02-29 MED ORDER — BD PEN NEEDLE NANO U/F 32G X 4 MM MISC: 1.0000 | Freq: Every day | 11 refills | Status: DC

## 2020-03-11 ENCOUNTER — Ambulatory Visit: Payer: BC Managed Care – PPO

## 2020-03-11 ENCOUNTER — Other Ambulatory Visit: Payer: Self-pay

## 2020-03-11 ENCOUNTER — Other Ambulatory Visit: Payer: Self-pay | Admitting: Medical

## 2020-03-11 DIAGNOSIS — R9431 Abnormal electrocardiogram [ECG] [EKG]: Secondary | ICD-10-CM

## 2020-03-11 DIAGNOSIS — E1122 Type 2 diabetes mellitus with diabetic chronic kidney disease: Secondary | ICD-10-CM | POA: Diagnosis not present

## 2020-03-11 DIAGNOSIS — N182 Chronic kidney disease, stage 2 (mild): Secondary | ICD-10-CM | POA: Diagnosis not present

## 2020-03-11 DIAGNOSIS — I129 Hypertensive chronic kidney disease with stage 1 through stage 4 chronic kidney disease, or unspecified chronic kidney disease: Secondary | ICD-10-CM | POA: Diagnosis not present

## 2020-03-11 NOTE — Telephone Encounter (Signed)
Is this appropriate?  

## 2020-03-12 ENCOUNTER — Ambulatory Visit: Payer: BC Managed Care – PPO

## 2020-03-12 ENCOUNTER — Other Ambulatory Visit: Payer: Self-pay

## 2020-03-12 DIAGNOSIS — R9431 Abnormal electrocardiogram [ECG] [EKG]: Secondary | ICD-10-CM

## 2020-03-12 DIAGNOSIS — E1122 Type 2 diabetes mellitus with diabetic chronic kidney disease: Secondary | ICD-10-CM

## 2020-03-12 DIAGNOSIS — I129 Hypertensive chronic kidney disease with stage 1 through stage 4 chronic kidney disease, or unspecified chronic kidney disease: Secondary | ICD-10-CM | POA: Diagnosis not present

## 2020-03-12 DIAGNOSIS — N182 Chronic kidney disease, stage 2 (mild): Secondary | ICD-10-CM | POA: Diagnosis not present

## 2020-03-12 NOTE — Progress Notes (Signed)
Spoke with patient and relayed information regarding recent stress test and the importance of coming in to speak with a Provider about it. Patient does not feel it is necessary to come in any earlier that his scheduled appointment. Relayed information to [place], DO.

## 2020-03-19 ENCOUNTER — Encounter: Payer: Self-pay | Admitting: Medical

## 2020-03-19 ENCOUNTER — Encounter: Payer: Self-pay | Admitting: Cardiology

## 2020-03-19 ENCOUNTER — Ambulatory Visit: Payer: BC Managed Care – PPO | Admitting: Cardiology

## 2020-03-19 ENCOUNTER — Other Ambulatory Visit: Payer: Self-pay

## 2020-03-19 VITALS — BP 117/63 | HR 66 | Temp 97.3°F | Ht 70.0 in | Wt 219.0 lb

## 2020-03-19 DIAGNOSIS — E1122 Type 2 diabetes mellitus with diabetic chronic kidney disease: Secondary | ICD-10-CM

## 2020-03-19 DIAGNOSIS — Z87891 Personal history of nicotine dependence: Secondary | ICD-10-CM

## 2020-03-19 DIAGNOSIS — R9439 Abnormal result of other cardiovascular function study: Secondary | ICD-10-CM | POA: Diagnosis not present

## 2020-03-19 DIAGNOSIS — I10 Essential (primary) hypertension: Secondary | ICD-10-CM

## 2020-03-19 DIAGNOSIS — N182 Chronic kidney disease, stage 2 (mild): Secondary | ICD-10-CM

## 2020-03-19 DIAGNOSIS — E1169 Type 2 diabetes mellitus with other specified complication: Secondary | ICD-10-CM

## 2020-03-19 DIAGNOSIS — E782 Mixed hyperlipidemia: Secondary | ICD-10-CM

## 2020-03-19 NOTE — Patient Instructions (Addendum)
The day before the cardiac CTA: Take carvedilol 6.25 mg 2 tablets twice daily.  On the day of the cardiac CTA: Take Coreg 6.25 mg 2 tablets prior to the imaging test.

## 2020-03-19 NOTE — Progress Notes (Signed)
Chief Complaint  Patient presents with  . Results    follow up    REQUESTING PHYSICIAN:  Landen, Knoedler, PA-C [address] [place],  [place] [address]  HPI  [name] [name] is a 59 y.o. male who presents to the office with a chief complaint of " test result follow up."   Patient's past medical history and cardiac risk factors include: Insulin-dependent diabetes mellitus type 2, hypertension with chronic kidney disease, mixed hyperlipidemia, family history of premature coronary artery disease, sleep apnea.  Patient presents to the office after undergoing echocardiogram and nuclear stress test.  Since last office visit patient states his effort related dyspnea has improved as his blood pressure has improved as well.  Patient states that his systolic blood pressures range between 120-130 mmHg.  He is tolerating the medication changes well.  However, is requesting to reduce the number of pills that he takes on a daily basis.  Patient was originally referred to the office at the request of his primary care provider given his underlying hypertension, hyperlipidemia, and premature coronary artery disease in the family.  Given his multiple cardiovascular risk factors he underwent an echo and a nuclear stress test for further with stratification.  His echocardiogram illustrates a preserved left ventricular systolic function with mild valvular heart disease.  His nuclear stress test was reported to be abnormal and high risk due to hypertensive response during walking Lexiscan.   Hyperlipidemia: Most recent lipid profile reviewed with the patient.  LDL currently not at goal given his underlying diabetes mellitus.  Patient is currently on Lipitor 20 mg p.o. nightly and tolerating medication well.  Premature coronary artery disease in the family: Her brother passed away at the age of 32 from myocardial infarction.  Patient states that he thought he had a coronary event back in 2008 and had  undergone a [facility] heart [facility]  Records unavailable for review. EKG 02/13/2020: Normal sinus rhythm with a ventricular rate of 76 bpm, normal axis, cannot rule out old anterior infarct, poor R wave progression, no underlying injury pattern.  Review of systems positive for: tiredness, fatigue. Currently patient denies chest pain, shortness of breath at rest or effort related symptoms, lightheadedness, dizziness, palpitations, orthopnea, paroxysmal nocturnal dyspnea, lower extremity swelling, near syncope, syncopal events, hematochezia, hemoptysis, hematemesis, melanotic stools, no symptoms of amaurosis fugax, motor or sensory symptoms or dysphasia in the last 6 months.   History of family history of premature coronary artery disease with brother passing away at age of 2.   Denies prior history of  myocardial infarction, congestive heart failure, deep venous thrombosis, pulmonary embolism, stroke, transient ischemic attack.  FUNCTIONAL STATUS: Does daily activities without limitations. But no exercise program.   ALLERGIES: Allergies  Allergen Reactions  . Pravastatin Other (See Comments)    Joint myalgias  . Hydrochlorothiazide     lightheadedness  . Metformin And Related     diarrhea   MEDICATION LIST PRIOR TO VISIT: Current Outpatient Medications on File Prior to Visit  Medication Sig Dispense Refill  . amLODipine (NORVASC) 10 MG tablet Take 1 tablet (10 mg total) by mouth daily. 90 tablet 1  . atorvastatin (LIPITOR) 40 MG tablet Take 1 tablet (40 mg total) by mouth at bedtime. 90 tablet 1  . carvedilol (COREG) 6.25 MG tablet Take 1 tablet (6.25 mg total) by mouth 2 (two) times daily with a meal. 180 tablet 1  . cetirizine (ZYRTEC) 10 MG tablet Take 10 mg by mouth daily.    Marland Kitchen  doxepin (SINEQUAN) 10 MG capsule Take 1 capsule (10 mg total) by mouth at bedtime. 90 capsule 1  . empagliflozin (JARDIANCE) 25 MG TABS tablet Take 25 mg by mouth daily. 90 tablet 3  .  fluticasone (FLONASE) 50 MCG/ACT nasal spray Place 2 sprays into both nostrils daily.    . hydrALAZINE (APRESOLINE) 25 MG tablet Take 1 tablet (25 mg total) by mouth 3 (three) times daily. 270 tablet 1  . insulin glargine (LANTUS) 100 UNIT/ML injection Inject 0.8 mLs (80 Units total) into the skin daily. 3 mo supply 80 mL 3  . Insulin Pen Needle (BD PEN NEEDLE NANO U/F) 32G X 4 MM MISC 1 each by Does not apply route at bedtime. 100 each 11  . lisinopril (ZESTRIL) 40 MG tablet Take 1 tablet (40 mg total) by mouth daily. 90 tablet 1  . Needles & Syringes MISC As directed 86ml of testosterone every 14 days 100 each 5  . tamsulosin (FLOMAX) 0.4 MG CAPS capsule Take 1 capsule (0.4 mg total) by mouth daily. 90 capsule 3  . testosterone cypionate (DEPOTESTOSTERONE CYPIONATE) 200 MG/ML injection Inject 1 mL (200 mg total) into the muscle every 14 (fourteen) days. 30 mL 3  . verapamil (CALAN) 120 MG tablet Take 1 tablet (120 mg total) by mouth once for 1 dose. 90 tablet 1  . vortioxetine HBr (TRINTELLIX) 5 MG TABS tablet Take 2 tablets (10 mg total) by mouth daily. 60 tablet 2   No current facility-administered medications on file prior to visit.    PAST MEDICAL HISTORY: Past Medical History:  Diagnosis Date  . Anxiety   . Depression   . Diabetes mellitus    Type 2  . Enlarged prostate   . Environmental allergies   . Environmental and seasonal allergies   . GERD (gastroesophageal reflux disease)   . Headache(784.0)    sinus and migraines  . Hyperlipidemia   . Hypertension   . IBS (irritable bowel syndrome)   . Low testosterone   . Sleep apnea    has used BiPAP in past; last sleep study >10 years    PAST SURGICAL HISTORY: The patient  Past Surgical History:  Procedure Laterality Date  . COLONOSCOPY  09/2018   normal, repeat in 10 years per patient.  [place], [place]  . KNEE ARTHROSCOPY Left   . SACROILIAC JOINT FUSION Right 05/02/2014   Procedure: SACROILIAC JOINT FUSION;  Surgeon: Emilee Hero, MD;  Location: [facility] OR;  Service: Orthopedics;  Laterality: Right;  Right sided sacroiliac joint fusion  . TONSILLECTOMY     uvula removed    FAMILY HISTORY: The patient family history includes Cancer in his brother, father, and mother; Diabetes in his brother; Heart disease in his maternal grandmother, paternal grandfather, and paternal grandmother; Heart disease (age of onset: 77) in his brother; Lung disease in his maternal grandfather; Stroke in his paternal grandmother.   SOCIAL HISTORY:  The patient  reports that he quit smoking about 27 years ago. He has a 10.00 pack-year smoking history. He has never used smokeless tobacco. He reports that he does not drink alcohol or use drugs.  14 ORGAN REVIEW OF SYSTEMS: CONSTITUTIONAL: No fever or significant weight loss EYES: No recent significant visual change EARS, NOSE, MOUTH, THROAT: No recent significant change in hearing CARDIOVASCULAR: See discussion in subjective/HPI RESPIRATORY: See discussion in subjective/HPI GASTROINTESTINAL: No recent complaints of abdominal pain GENITOURINARY: No recent significant change in genitourinary status MUSCULOSKELETAL: No recent significant change in musculoskeletal status INTEGUMENTARY:  No recent rash NEUROLOGIC: No recent significant change in motor function PSYCHIATRIC: No recent significant change in mood ENDOCRINOLOGIC: No recent significant change in endocrine status HEMATOLOGIC/LYMPHATIC: No recent significant unexpected bruising ALLERGIC/IMMUNOLOGIC: No recent unexplained allergic reaction  PHYSICAL EXAM: Blood pressure 117/63, pulse 66, temperature (!) 97.3 F (36.3 C), height 5\' 10"  (1.778 m), weight 219 lb (99.3 kg).  Repeat blood pressure on the left arm was 160/90 mmHg. Body mass index is 31.42 kg/m. CONSTITUTIONAL: Well-developed and well-nourished. No acute distress.  SKIN: Skin is warm and dry. No rash noted. No cyanosis. No pallor. No jaundice HEAD: Normocephalic  and atraumatic.  EYES: No scleral icterus MOUTH/THROAT: Moist oral membranes.  NECK: No JVD present. No thyromegaly noted. No carotid bruits  LYMPHATIC: No visible cervical adenopathy.  CHEST Normal respiratory effort. No intercostal retractions  LUNGS: Clear to auscultation bilaterally.  No stridor. No wheezes. No rales.  CARDIOVASCULAR: Regular rate and rhythm, positive S1-S2, no murmurs rubs or gallops appreciated. ABDOMINAL: Obese, soft, nontender, nondistended, positive bowel sounds in all 4 quadrants.  No apparent ascites.  EXTREMITIES: No peripheral edema  HEMATOLOGIC: No significant bruising NEUROLOGIC: Oriented to person, place, and time. Nonfocal. Normal muscle tone.  PSYCHIATRIC: Normal mood and affect. Normal behavior. Cooperative  CARDIAC DATABASE: EKG: 02/13/2020: Normal sinus rhythm with a ventricular rate of 76 bpm, normal axis, cannot rule out old anterior infarct, poor R wave progression, no underlying injury pattern.  Echocardiogram:  03/12/2020: LVEF 68%, mild concentric hypertrophy of the LV, grade 1 diastolic impairment, mild MR.  Stress Testing: 03/11/2020: Stress EKG is non-diagnostic, as this is pharmacological  stress test using Lexiscan.  Hypotension noted during stress with lexiscan and modified Bruce protocol exercise, reaching 59% MPHR. Myocardial perfusion imaging is normal. Left ventricular ejection fraction is 64% with normal wall motion. High risk study.  Heart Catheterization: 2008 at [facility]: CBC Latest Ref Rng & Units 01/30/2020 06/06/2019 03/26/2015  WBC 3.4 - 10.8 x10E3/uL 7.9 9.9 9.5  Hemoglobin 13.0 - 17.7 g/dL 18.5(H) 16.2 16.9  Hematocrit 37.5 - 51.0 % 55.4(H) 49.6 50.3  Platelets 150 - 450 x10E3/uL 240 276 270     CMP Latest Ref Rng & Units 01/30/2020 06/06/2019 03/26/2015  Glucose 65 - 99 mg/dL 109(H) 88 151(H)  BUN 6 - 24 mg/dL 20 20 27(H)  Creatinine 0.76 - 1.27 mg/dL 1.14 1.10 1.45(H)  Sodium 134 - 144 mmol/L 145(H) 140  143  Potassium 3.5 - 5.2 mmol/L 4.6 4.6 4.3  Chloride 96 - 106 mmol/L 107(H) 103 105  CO2 20 - 29 mmol/L 23 23 28   Calcium 8.7 - 10.2 mg/dL 10.0 9.8 10.7(H)  Total Protein 6.0 - 8.5 g/dL 7.0 7.0 -  Total Bilirubin 0.0 - 1.2 mg/dL 0.4 0.4 -  Alkaline Phos 39 - 117 IU/L 76 70 -  AST 0 - 40 IU/L 24 22 -  ALT 0 - 44 IU/L 26 22 -     Lipid Panel     Component Value Date/Time   CHOL 147 01/30/2020 1223   TRIG 180 (H) 01/30/2020 1223   HDL 29 (L) 01/30/2020 1223   CHOLHDL 5.1 (H) 01/30/2020 1223   CHOLHDL 6.6 Ratio 06/17/2010 2242   VLDL 60 (H) 06/17/2010 2242   LDLCALC 87 01/30/2020 1223   LDLDIRECT 93 08/20/2009 2019   LABVLDL 31 01/30/2020 1223    FINAL MEDICATION LIST END OF ENCOUNTER: No orders of the defined types were placed in this encounter.   There are no discontinued  medications.   Current Outpatient Medications on File Prior to Visit  Medication Sig Dispense Refill  . amLODipine (NORVASC) 10 MG tablet Take 1 tablet (10 mg total) by mouth daily. 90 tablet 1  . atorvastatin (LIPITOR) 40 MG tablet Take 1 tablet (40 mg total) by mouth at bedtime. 90 tablet 1  . carvedilol (COREG) 6.25 MG tablet Take 1 tablet (6.25 mg total) by mouth 2 (two) times daily with a meal. 180 tablet 1  . cetirizine (ZYRTEC) 10 MG tablet Take 10 mg by mouth daily.    Marland Kitchen. doxepin (SINEQUAN) 10 MG capsule Take 1 capsule (10 mg total) by mouth at bedtime. 90 capsule 1  . empagliflozin (JARDIANCE) 25 MG TABS tablet Take 25 mg by mouth daily. 90 tablet 3  . fluticasone (FLONASE) 50 MCG/ACT nasal spray Place 2 sprays into both nostrils daily.    . hydrALAZINE (APRESOLINE) 25 MG tablet Take 1 tablet (25 mg total) by mouth 3 (three) times daily. 270 tablet 1  . insulin glargine (LANTUS) 100 UNIT/ML injection Inject 0.8 mLs (80 Units total) into the skin daily. 3 mo supply 80 mL 3  . Insulin Pen Needle (BD PEN NEEDLE NANO U/F) 32G X 4 MM MISC 1 each by Does not apply route at bedtime. 100 each 11  .  lisinopril (ZESTRIL) 40 MG tablet Take 1 tablet (40 mg total) by mouth daily. 90 tablet 1  . Needles & Syringes MISC As directed 1ml of testosterone every 14 days 100 each 5  . tamsulosin (FLOMAX) 0.4 MG CAPS capsule Take 1 capsule (0.4 mg total) by mouth daily. 90 capsule 3  . testosterone cypionate (DEPOTESTOSTERONE CYPIONATE) 200 MG/ML injection Inject 1 mL (200 mg total) into the muscle every 14 (fourteen) days. 30 mL 3  . verapamil (CALAN) 120 MG tablet Take 1 tablet (120 mg total) by mouth once for 1 dose. 90 tablet 1  . vortioxetine HBr (TRINTELLIX) 5 MG TABS tablet Take 2 tablets (10 mg total) by mouth daily. 60 tablet 2   No current facility-administered medications on file prior to visit.   IMPRESSION:    ICD-10-CM   1. Abnormal nuclear stress test  R94.39 CT CORONARY MORPH W/CTA COR W/SCORE W/CA W/CM &/OR WO/CM    CT CORONARY FRACTIONAL FLOW RESERVE DATA PREP    CT CORONARY FRACTIONAL FLOW RESERVE FLUID ANALYSIS  2. Type 2 DM with CKD stage 2 and hypertension (HCC)  E11.22    I12.9    N18.2   3. Essential hypertension, benign  I10   4. Type 2 diabetes mellitus with stage 2 chronic kidney disease, with long-term current use of insulin (HCC)  E11.22    N18.2    Z79.4   5. Mixed hyperlipidemia  E78.2   6. Former smoker  Z87.891   7. DM type 2 with diabetic mixed hyperlipidemia [facility](HCC)  E11.69    E78.2     RECOMMENDATIONS: [name] [name] is a 59 y.o. male whose past medical history and cardiac risk factors include: Insulin-dependent diabetes mellitus type 2, hypertension with chronic kidney disease, mixed hyperlipidemia, family history of premature coronary artery disease, sleep apnea.  Abnormal nuclear stress test:  Symptomatically patient has improved as result of better blood pressure management.  However, given his multiple cardiovascular risk factors as noted above and an abnormal nuclear stress test would recommend additional evaluation with cardiac CTA plus or minus  FFR.  I discussed the risks, benefits and alternatives to cardiac CTA with the patient in great detail  at today's office visit.  Patient is currently on carvedilol and his ventricular rate is well controlled.  However, patient is instructed to take carvedilol 12.5 mg p.o. twice daily the day before his cardiac CTA and on the day of his cardiac CTA to take carvedilol 12.5 mg p.o. twice daily.  The study will be performed at [facility] and he is to follow-up once the study is complete.  Hypertension with insulin-dependent diabetes mellitus type 2 and chronic kidney disease stage II:  Systolic blood pressure is currently at  goal.  At the last office he was noted to be on two ACEi. Since than he has discontinue trandolapril. Now only on lisinopril 40 mg p.o. daily.  Continue Norvasc 10 mg p.o. every morning instead.  Continue Coreg 6.25mg  p.o. twice daily.  Continue Hydralazine will be changed to 25 mg p.o. 3 times daily.  Had a discussion with the patient in regards to the addition of Isordil however, patient does take ED medication on a as needed basis.  Patient is asked to keep a log of his blood pressures and to bring it in at the next office visit.  Low-salt diet recommended.  Patient is encouraged to exercise atleast 30 minutes of moderate intensity activity 5 days a week.  Labs reviewed.  Insulin-dependent diabetes mellitus type 2 with hyperlipidemia:  Increased Lipitor to 40 mg p.o. nightly (02/13/2020). Will repeat fasting lipids in 6 week to re-evaluate treatment.   He is tolerating the addition of Lipitor.  Did not endorse any  myalgias.  Dietary recommendations encouraged.  Family history of premature coronary artery disease:  Patient underwent an echocardiogram and nuclear stress test.  Results reviewed in great detail noted above for further reference.    Continue beta-blocker therapy.  Continue ACE inhibitor.    Continue statin.  Former smoker: Educated on  the importance of continued smoking cessation.  Orders Placed This Encounter  Procedures  . CT CORONARY MORPH W/CTA COR W/SCORE W/CA W/CM &/OR WO/CM  . CT CORONARY FRACTIONAL FLOW RESERVE DATA PREP  . CT CORONARY FRACTIONAL FLOW RESERVE FLUID ANALYSIS   --Continue cardiac medications as reconciled in final medication list. --Return in about 4 weeks (around 04/16/2020) for after CT scan . or sooner if needed. --Continue follow-up with your primary care physician regarding the management of your other chronic comorbid conditions.  Patient's questions and concerns were addressed to his satisfaction. He voices understanding of the instructions provided during this encounter.   This note was created using a voice recognition software as a result there may be grammatical errors inadvertently enclosed that do not reflect the nature of this encounter. Every attempt is made to correct such errors.  Tessa Lerner, DO, [facility] Office: [phone]

## 2020-03-21 ENCOUNTER — Other Ambulatory Visit: Payer: Self-pay | Admitting: Cardiology

## 2020-03-21 DIAGNOSIS — R9439 Abnormal result of other cardiovascular function study: Secondary | ICD-10-CM

## 2020-03-25 ENCOUNTER — Encounter: Payer: Self-pay | Admitting: Medical

## 2020-03-26 LAB — HM DIABETES EYE EXAM

## 2020-04-01 ENCOUNTER — Encounter (HOSPITAL_COMMUNITY): Payer: Self-pay

## 2020-04-01 ENCOUNTER — Telehealth (HOSPITAL_COMMUNITY): Payer: Self-pay | Admitting: Emergency Medicine

## 2020-04-01 DIAGNOSIS — R9439 Abnormal result of other cardiovascular function study: Secondary | ICD-10-CM | POA: Diagnosis not present

## 2020-04-01 NOTE — Telephone Encounter (Signed)
Reaching out to patient to offer assistance regarding upcoming cardiac imaging study; pt verbalizes understanding of appt date/time, parking situation and where to check in, pre-test NPO status and medications ordered, and verified current allergies; name and call back number provided for further questions should they arise Rockwell Alexandria RN Navigator Cardiac Imaging [facility] [phone] office [phone] cell   Pt states just had labs drawn- results pending

## 2020-04-02 ENCOUNTER — Ambulatory Visit (HOSPITAL_COMMUNITY)
Admission: RE | Admit: 2020-04-02 | Discharge: 2020-04-02 | Disposition: A | Discharge status: Home / Self Care | Payer: BC Managed Care – PPO | Source: Ambulatory Visit | Attending: Cardiology | Admitting: Cardiology

## 2020-04-02 ENCOUNTER — Other Ambulatory Visit: Payer: Self-pay

## 2020-04-02 DIAGNOSIS — R9439 Abnormal result of other cardiovascular function study: Secondary | ICD-10-CM | POA: Diagnosis not present

## 2020-04-02 DIAGNOSIS — R9430 Abnormal result of cardiovascular function study, unspecified: Secondary | ICD-10-CM | POA: Diagnosis not present

## 2020-04-02 LAB — BASIC METABOLIC PANEL
BUN/Creatinine Ratio: 19 (ref 9–20)
BUN: 23 mg/dL (ref 6–24)
CO2: 22 mmol/L (ref 20–29)
Calcium: 9.8 mg/dL (ref 8.7–10.2)
Chloride: 106 mmol/L (ref 96–106)
Creatinine, Ser: 1.24 mg/dL (ref 0.76–1.27)
GFR calc Af Amer: 75 mL/min/{1.73_m2} (ref >59)
GFR calc non Af Amer: 65 mL/min/{1.73_m2} (ref >59)
Glucose: 164 mg/dL — ABNORMAL HIGH (ref 65–99)
Potassium: 4.3 mmol/L (ref 3.5–5.2)
Sodium: 145 mmol/L — ABNORMAL HIGH (ref 134–144)

## 2020-04-02 MED ORDER — METOPROLOL TARTRATE 5 MG/5ML IV SOLN
INTRAVENOUS | Status: AC
  Filled 2020-04-02: qty 15

## 2020-04-02 MED ORDER — NITROGLYCERIN 0.4 MG SL SUBL
0.8000 mg | SUBLINGUAL_TABLET | Freq: Once | SUBLINGUAL | Status: AC
  Administered 2020-04-02: 0.8 mg via SUBLINGUAL

## 2020-04-02 MED ORDER — METOPROLOL TARTRATE 5 MG/5ML IV SOLN
5.0000 mg | INTRAVENOUS | Status: DC | PRN
  Administered 2020-04-02: 14:00:00 5 mg via INTRAVENOUS

## 2020-04-02 MED ORDER — IOHEXOL 350 MG/ML SOLN
80.0000 mL | Freq: Once | INTRAVENOUS | Status: AC | PRN
  Administered 2020-04-02: 80 mL via INTRAVENOUS

## 2020-04-02 MED ORDER — NITROGLYCERIN 0.4 MG SL SUBL
SUBLINGUAL_TABLET | SUBLINGUAL | Status: AC
  Filled 2020-04-02: qty 2

## 2020-04-02 NOTE — Progress Notes (Signed)
CT scan completed. Tolerated well. D/c home ambulatory. Awake and alert. In no distress

## 2020-04-03 ENCOUNTER — Telehealth: Payer: Self-pay

## 2020-04-03 NOTE — Telephone Encounter (Signed)
-----   Message from [place], [place] sent at 04/03/2020  7:55 AM EDT ----- CT results reviewed; please inform the patient to follow up at his next visit and will go over the details and have him bring in his medications bottles.

## 2020-04-03 NOTE — Telephone Encounter (Signed)
Left voicemail to cb.

## 2020-04-03 NOTE — Telephone Encounter (Signed)
Informed patient about follow up visit to discuss results and to bring in all of his medications. Patient expressed dissatisfaction with having to bring in medication bottles with him. I explained that this is per Dr.Tolia and that we ask for patients to do this every visit. Patient verbalized understanding.

## 2020-04-04 ENCOUNTER — Other Ambulatory Visit: Payer: Self-pay

## 2020-04-04 ENCOUNTER — Telehealth: Payer: Self-pay

## 2020-04-04 DIAGNOSIS — E1169 Type 2 diabetes mellitus with other specified complication: Secondary | ICD-10-CM

## 2020-04-04 DIAGNOSIS — E782 Mixed hyperlipidemia: Secondary | ICD-10-CM

## 2020-04-04 MED ORDER — ATORVASTATIN CALCIUM 40 MG PO TABS: 40.0000 mg | ORAL_TABLET | Freq: Every day | ORAL | 3 refills | Status: DC

## 2020-04-04 NOTE — Telephone Encounter (Signed)
Per our conversation I spoke to the patient and I explained to him he does have blockages and he needs to come back to discuss options and importance of his medications. He got very angry and said since he did not need surgery there is no point for him to come in.

## 2020-04-04 NOTE — Telephone Encounter (Signed)
Just called the patient back but had to leave a voicemail. Let me know if he calls back.

## 2020-04-16 ENCOUNTER — Ambulatory Visit: Payer: BC Managed Care – PPO | Admitting: Cardiology

## 2020-04-30 ENCOUNTER — Other Ambulatory Visit: Payer: Self-pay | Admitting: Cardiology

## 2020-04-30 ENCOUNTER — Other Ambulatory Visit: Payer: Self-pay

## 2020-04-30 ENCOUNTER — Ambulatory Visit: Payer: BC Managed Care – PPO | Admitting: Cardiology

## 2020-04-30 ENCOUNTER — Encounter: Payer: Self-pay | Admitting: Cardiology

## 2020-04-30 VITALS — BP 124/70 | HR 74 | Temp 97.6°F | Ht 70.0 in | Wt 218.0 lb

## 2020-04-30 DIAGNOSIS — R072 Precordial pain: Secondary | ICD-10-CM

## 2020-04-30 DIAGNOSIS — N182 Chronic kidney disease, stage 2 (mild): Secondary | ICD-10-CM

## 2020-04-30 DIAGNOSIS — E782 Mixed hyperlipidemia: Secondary | ICD-10-CM

## 2020-04-30 DIAGNOSIS — R0600 Dyspnea, unspecified: Secondary | ICD-10-CM

## 2020-04-30 DIAGNOSIS — R0609 Other forms of dyspnea: Secondary | ICD-10-CM | POA: Diagnosis not present

## 2020-04-30 DIAGNOSIS — I10 Essential (primary) hypertension: Secondary | ICD-10-CM

## 2020-04-30 DIAGNOSIS — G4733 Obstructive sleep apnea (adult) (pediatric): Secondary | ICD-10-CM

## 2020-04-30 DIAGNOSIS — Z87891 Personal history of nicotine dependence: Secondary | ICD-10-CM

## 2020-04-30 DIAGNOSIS — Z712 Person consulting for explanation of examination or test findings: Secondary | ICD-10-CM | POA: Diagnosis not present

## 2020-04-30 DIAGNOSIS — E1122 Type 2 diabetes mellitus with diabetic chronic kidney disease: Secondary | ICD-10-CM | POA: Diagnosis not present

## 2020-04-30 DIAGNOSIS — Z794 Long term (current) use of insulin: Secondary | ICD-10-CM

## 2020-04-30 DIAGNOSIS — E1169 Type 2 diabetes mellitus with other specified complication: Secondary | ICD-10-CM

## 2020-04-30 MED ORDER — CARVEDILOL 12.5 MG PO TABS: 12.5000 mg | ORAL_TABLET | Freq: Two times a day (BID) | ORAL | 0 refills | Status: DC

## 2020-04-30 NOTE — Progress Notes (Signed)
[name] [name] Date of Birth: 1961-06-13 MRN: [id number] Primary Care Provider:Tysinger, Kermit Balo, PA-C Primary Cardiologist: Tessa Lerner, DO, [facility] (established care 02/13/2020)  Date: 04/30/20 Last Office Visit: 03/19/2020  Chief Complaint  Patient presents with  . Shortness of Breath    Pt c/o chest pain and SOB  . Chest Pain    HPI  [name] [name] is a 59 y.o.  male who presents to the office with a chief complaint of " chest pain or shortness of breath." Patient's past medical history and cardiovascular risk factors include: Insulin-dependent diabetes mellitus type 2, hypertension with chronic kidney disease, mixed hyperlipidemia, family history of premature coronary artery disease, sleep apnea, coronary artery disease.  Patient was originally referred to the office back in February 2021 for evaluation of hypertension, OSA, hyperlipidemia and family history of premature coronary artery disease.  Since then patient has undergone an echocardiogram and nuclear stress test.  The nuclear stress test was reported to be high risk and therefore he was recommended to undergo cardiac CTA with FFR.  Reviewed the cardiac CTA with FFR results with the patient at today's visit he understands that he does have coronary disease most significant in the LAD/diagonal distribution.  And as discussed during prior phone calls that it is important to work on improving modifiable cardiovascular risk factors.  Since last office visit and telephone conversation patient states that he has been having chest discomfort and shortness of breath.  He states that yesterday he was fertilizing when he experienced chest discomfort over the right sternal border, intensity 1 or 2 out of 10, stinging-like sensation, lasted for few seconds, not always brought on by effort related activities and is usually self-limited.  Associated symptoms include effort related dyspnea.  Patient has not required sublingual nitroglycerin  tablets.  ALLERGIES: Allergies  Allergen Reactions  . Pravastatin Other (See Comments)    Joint myalgias  . Hydrochlorothiazide Other (See Comments)    lightheadedness  . Metformin And Related Diarrhea     MEDICATION LIST PRIOR TO VISIT: Current Outpatient Medications on File Prior to Visit  Medication Sig Dispense Refill  . amLODipine (NORVASC) 10 MG tablet Take 1 tablet (10 mg total) by mouth daily. 90 tablet 1  . aspirin EC 81 MG tablet Take 81 mg by mouth daily.    Marland Kitchen atorvastatin (LIPITOR) 40 MG tablet Take 1 tablet (40 mg total) by mouth at bedtime. 90 tablet 3  . cetirizine (ZYRTEC) 10 MG tablet Take 10 mg by mouth daily.    Marland Kitchen doxepin (SINEQUAN) 10 MG capsule Take 1 capsule (10 mg total) by mouth at bedtime. 90 capsule 1  . empagliflozin (JARDIANCE) 25 MG TABS tablet Take 25 mg by mouth daily. 90 tablet 3  . fluticasone (FLONASE) 50 MCG/ACT nasal spray Place 2 sprays into both nostrils daily.    . hydrALAZINE (APRESOLINE) 25 MG tablet Take 1 tablet (25 mg total) by mouth 3 (three) times daily. 270 tablet 1  . insulin glargine (LANTUS) 100 UNIT/ML injection Inject 0.8 mLs (80 Units total) into the skin daily. 3 mo supply (Patient taking differently: Inject 87 Units into the skin daily. 3 mo supply) 80 mL 3  . Insulin Pen Needle (BD PEN NEEDLE NANO U/F) 32G X 4 MM MISC 1 each by Does not apply route at bedtime. 100 each 11  . lisinopril (ZESTRIL) 40 MG tablet Take 1 tablet (40 mg total) by mouth daily. 90 tablet 1  . Needles & Syringes MISC As directed 21ml of  testosterone every 14 days 100 each 5  . tamsulosin (FLOMAX) 0.4 MG CAPS capsule Take 1 capsule (0.4 mg total) by mouth daily. 90 capsule 3  . testosterone cypionate (DEPOTESTOSTERONE CYPIONATE) 200 MG/ML injection Inject 1 mL (200 mg total) into the muscle every 14 (fourteen) days. 30 mL 3  . verapamil (CALAN) 120 MG tablet Take 1 tablet (120 mg total) by mouth once for 1 dose. 90 tablet 1  . vortioxetine HBr (TRINTELLIX) 5  MG TABS tablet Take 2 tablets (10 mg total) by mouth daily. 60 tablet 2   No current facility-administered medications on file prior to visit.    PAST MEDICAL HISTORY: Past Medical History:  Diagnosis Date  . Anxiety   . Coronary artery disease   . Depression   . Diabetes mellitus    Type 2  . Enlarged prostate   . Environmental allergies   . Environmental and seasonal allergies   . GERD (gastroesophageal reflux disease)   . Headache(784.0)    sinus and migraines  . Hyperlipidemia   . Hypertension   . IBS (irritable bowel syndrome)   . Low testosterone   . Sleep apnea    has used BiPAP in past; last sleep study >10 years    PAST SURGICAL HISTORY: Past Surgical History:  Procedure Laterality Date  . COLONOSCOPY  09/2018   normal, repeat in 10 years per patient.  [place]  . KNEE ARTHROSCOPY Left   . SACROILIAC JOINT FUSION Right 05/02/2014   Procedure: SACROILIAC JOINT FUSION;  Surgeon: Emilee Hero, MD;  Location: [facility] OR;  Service: Orthopedics;  Laterality: Right;  Right sided sacroiliac joint fusion  . TONSILLECTOMY     uvula removed    FAMILY HISTORY: The patient's family history includes Cancer in his brother, father, and mother; Diabetes in his brother; Heart disease in his maternal grandmother, paternal grandfather, and paternal grandmother; Heart disease (age of onset: 75) in his brother; Lung disease in his maternal grandfather; Stroke in his paternal grandmother.   SOCIAL HISTORY:  The patient  reports that he quit smoking about 28 years ago. He has a 10.00 pack-year smoking history. He has never used smokeless tobacco. He reports that he does not drink alcohol or use drugs.  Review of Systems  Constitution: Negative for chills and fever.  HENT: Negative for hoarse voice and nosebleeds.   Eyes: Negative for discharge, double vision and pain.  Cardiovascular: Negative for chest pain, claudication, dyspnea on exertion, leg swelling, near-syncope,  orthopnea, palpitations, paroxysmal nocturnal dyspnea and syncope.  Respiratory: Negative for hemoptysis and shortness of breath.   Musculoskeletal: Negative for muscle cramps and myalgias.  Gastrointestinal: Negative for abdominal pain, constipation, diarrhea, hematemesis, hematochezia, melena, nausea and vomiting.  Neurological: Negative for dizziness and light-headedness.    PHYSICAL EXAM: Vitals with BMI 04/30/2020 04/02/2020 04/02/2020  Height 5\' 10"  - -  Weight 218 lbs - -  BMI 31.28 - -  Systolic 124 112  Diastolic 70 70 75  Pulse 74 67 -   CONSTITUTIONAL: Well-developed and well-nourished. No acute distress.  SKIN: Skin is warm and dry. No rash noted. No cyanosis. No pallor. No jaundice HEAD: Normocephalic and atraumatic.  EYES: No scleral icterus MOUTH/THROAT: Moist oral membranes.  NECK: No JVD present. No thyromegaly noted. No carotid bruits  LYMPHATIC: No visible cervical adenopathy.  CHEST Normal respiratory effort. No intercostal retractions  LUNGS: Clear to auscultation bilaterally.  No stridor. No wheezes. No rales.  CARDIOVASCULAR: Regular rate and rhythm, positive  S1-S2, no murmurs rubs or gallops appreciated. ABDOMINAL: Obese, soft, nontender, nondistended, positive bowel sounds in all 4 quadrants.  No apparent ascites.  EXTREMITIES: No peripheral edema  HEMATOLOGIC: No significant bruising NEUROLOGIC: Oriented to person, place, and time. Nonfocal. Normal muscle tone.  PSYCHIATRIC: Normal mood and affect. Normal behavior. Cooperative  CARDIAC DATABASE: EKG: 02/13/2020: Normal sinus rhythm with a ventricular rate of 76 bpm, normal axis, cannot rule out old anterior infarct, poor R wave progression, no underlying injury pattern. 04/30/2020:sinus rhythm, 67 bpm, normal axis deviation, poor R wave progression, old anterior infarct, without underlying ischemia or injury pattern.  Echocardiogram:  03/12/2020: LVEF 68%, mild concentric hypertrophy of the LV, grade 1  diastolic impairment, mild MR.  Stress Testing: 03/11/2020: Stress EKG is non-diagnostic, as this is pharmacological  stress test using Lexiscan.  Hypotension noted during stress with lexiscan and modified Bruce protocol exercise, reaching 59% MPHR. Myocardial perfusion imaging is normal. Left ventricular ejection fraction is 64% with normal wall motion. High risk study.  Cardiac CTA with FFR: 03/2020: Coronary Arteries: Right dominant with no anomalies LM: Normal LAD: 50-69% mixed plaque with prominent soft plaque component in proximal vessel 50-69% calcific plaque in mid/distal vessel D1: 25-49% soft plaque in ostial vessel D2: Small vessel 50-69% ostial calcific plaque D3: Small vessel Circumflex: 1-24% soft plaque in mid vessel OM1: Normal AV Groove: Normal RCA: 1-24% calcified ostial stenosis PDA: Normal PLB: Normal  IMPRESSION: Calcium score 95 which is 76 th percentile for age and sex. Normal aortic root 2.9 cm. CAD CAD RADS 3 most significant in LAD and diagonal. Study sent for FFR CT  Fractional flow reserve assessment: RCA 0.99 Circumflex 0.90 LAD 0.94 proximally 0.88 mid vessel 0.84 distally D1: 0.89 IMPRESSION: FFR CT does not suggest hemodynamically significant lesions in LAD/Diagonal see above. Medical Rx and aggressive risk factor modification  [facility]: [facility]  LABORATORY DATA: CBC Latest Ref Rng & Units 01/30/2020 06/06/2019 03/26/2015  WBC 3.4 - 10.8 x10E3/uL 7.9 9.9 9.5  Hemoglobin 13.0 - 17.7 g/dL 18.5(H) 16.2 16.9  Hematocrit 37.5 - 51.0 % 55.4(H) 49.6 50.3  Platelets 150 - 450 x10E3/uL 240 276 270    CMP Latest Ref Rng & Units 04/01/2020 01/30/2020 06/06/2019  Glucose 65 - 99 mg/dL 235(T) 614(E) 88  BUN 6 - 24 mg/dL 23 20 20   Creatinine 0.76 - 1.27 mg/dL 3.15 4.00  Sodium 134 - 144 mmol/L 145(H) 145(H) 140  Potassium 3.5 - 5.2 mmol/L 4.3 4.6 4.6  Chloride 96 - 106 mmol/L 106 107(H) 103  CO2 20 - 29 mmol/L 22 23 23   Calcium 8.7  - 10.2 mg/dL 9.8 8.67 9.8  Total Protein 6.0 - 8.5 g/dL - 7.0 7.0  Total Bilirubin 0.0 - 1.2 mg/dL - 0.4 0.4  Alkaline Phos 39 - 117 IU/L - 76 70  AST 0 - 40 IU/L - 24 22  ALT 0 - 44 IU/L - 26 22    Lipid Panel     Component Value Date/Time   CHOL 147 01/30/2020 1223   TRIG 180 (H) 01/30/2020 1223   HDL 29 (L) 01/30/2020 1223   CHOLHDL 5.1 (H) 01/30/2020 1223   CHOLHDL 6.6 Ratio 06/17/2010 2242   VLDL 60 (H) 06/17/2010 2242   LDLCALC 87 01/30/2020 1223   LDLDIRECT 93 08/20/2009 2019   LABVLDL 31 01/30/2020 1223    Lab Results  Component Value Date   HGBA1C 8.4 (H) 01/30/2020   HGBA1C 8.5 (H) 06/06/2019   HGBA1C  10.5 (H) 03/20/2019   No components found for: NTPROBNP Lab Results  Component Value Date   TSH 1.900 04/13/2019   TSH 3.171 01/20/2011   TSH 1.282 04/18/2008    Cardiac Panel (last 3 results) No results for input(s): CKTOTAL, CKMB, TROPONINIHS, RELINDX in the last 72 hours.  FINAL MEDICATION LIST END OF ENCOUNTER: Meds ordered this encounter  Medications  . carvedilol (COREG) 12.5 MG tablet    Sig: Take 1 tablet (12.5 mg total) by mouth 2 (two) times daily with a meal. Hold if systolic blood pressure (top blood pressure number) less than 100 mmHg or heart rate less than 60 bpm (pulse).    Dispense:  180 tablet    Refill:  0    Medications Discontinued During This Encounter  Medication Reason  . carvedilol (COREG) 6.25 MG tablet      Current Outpatient Medications:  .  amLODipine (NORVASC) 10 MG tablet, Take 1 tablet (10 mg total) by mouth daily., Disp: 90 tablet, Rfl: 1 .  aspirin EC 81 MG tablet, Take 81 mg by mouth daily., Disp: , Rfl:  .  atorvastatin (LIPITOR) 40 MG tablet, Take 1 tablet (40 mg total) by mouth at bedtime., Disp: 90 tablet, Rfl: 3 .  carvedilol (COREG) 12.5 MG tablet, Take 1 tablet (12.5 mg total) by mouth 2 (two) times daily with a meal. Hold if systolic blood pressure (top blood pressure number) less than 100 mmHg or heart rate  less than 60 bpm (pulse)., Disp: 180 tablet, Rfl: 0 .  cetirizine (ZYRTEC) 10 MG tablet, Take 10 mg by mouth daily., Disp: , Rfl:  .  doxepin (SINEQUAN) 10 MG capsule, Take 1 capsule (10 mg total) by mouth at bedtime., Disp: 90 capsule, Rfl: 1 .  empagliflozin (JARDIANCE) 25 MG TABS tablet, Take 25 mg by mouth daily., Disp: 90 tablet, Rfl: 3 .  fluticasone (FLONASE) 50 MCG/ACT nasal spray, Place 2 sprays into both nostrils daily., Disp: , Rfl:  .  hydrALAZINE (APRESOLINE) 25 MG tablet, Take 1 tablet (25 mg total) by mouth 3 (three) times daily., Disp: 270 tablet, Rfl: 1 .  insulin glargine (LANTUS) 100 UNIT/ML injection, Inject 0.8 mLs (80 Units total) into the skin daily. 3 mo supply (Patient taking differently: Inject 87 Units into the skin daily. 3 mo supply), Disp: 80 mL, Rfl: 3 .  Insulin Pen Needle (BD PEN NEEDLE NANO U/F) 32G X 4 MM MISC, 1 each by Does not apply route at bedtime., Disp: 100 each, Rfl: 11 .  lisinopril (ZESTRIL) 40 MG tablet, Take 1 tablet (40 mg total) by mouth daily., Disp: 90 tablet, Rfl: 1 .  Needles & Syringes MISC, As directed 1ml of testosterone every 14 days, Disp: 100 each, Rfl: 5 .  tamsulosin (FLOMAX) 0.4 MG CAPS capsule, Take 1 capsule (0.4 mg total) by mouth daily., Disp: 90 capsule, Rfl: 3 .  testosterone cypionate (DEPOTESTOSTERONE CYPIONATE) 200 MG/ML injection, Inject 1 mL (200 mg total) into the muscle every 14 (fourteen) days., Disp: 30 mL, Rfl: 3 .  verapamil (CALAN) 120 MG tablet, Take 1 tablet (120 mg total) by mouth once for 1 dose., Disp: 90 tablet, Rfl: 1 .  vortioxetine HBr (TRINTELLIX) 5 MG TABS tablet, Take 2 tablets (10 mg total) by mouth daily., Disp: 60 tablet, Rfl: 2  IMPRESSION:    ICD-10-CM   1. Precordial chest pain  R07.2   2. DOE (dyspnea on exertion)  R06.00 EKG 12-Lead  3. Encounter to discuss test results  Z71.2  4. Type 2 DM with CKD stage 2 and hypertension (HCC)  E11.22 carvedilol (COREG) 12.5 MG tablet   I12.9    N18.2   5.  Essential hypertension, benign  I10   6. Type 2 diabetes mellitus with stage 2 chronic kidney disease, with long-term current use of insulin (HCC)  E11.22    N18.2    Z79.4   7. Mixed hyperlipidemia  E78.2 Lipid Panel With LDL/HDL Ratio  8. Former smoker  Z87.891   9. DM type 2 with diabetic mixed hyperlipidemia (HCC)  E11.69    E78.2   10. OSA (obstructive sleep apnea)  G47.33      RECOMMENDATIONS: [name] [name] is a 59 y.o. male whose past medical history and cardiovascular risk factors include: Insulin-dependent diabetes mellitus type 2, hypertension with chronic kidney disease, mixed hyperlipidemia, family history of premature coronary artery disease, sleep apnea, established coronary artery disease.  Established coronary artery disease with precordial chest pain:  Patient symptoms of chest pain are not typical in nature.  Reviewed the CT FFR results with the patient at today's office visit.    Medications reconciled.    Increase Coreg to 12.5 mg p.o. twice daily with holding parameters.  Currently not on Isordil or long-acting nitrates as patient does use erectile dysfunction medications on a as needed basis.  Continue aspirin and statin medications.  Blood pressure is well controlled.  Recommended following up with primary team in regards to better glycemic control.  Continue risk factor modifications.  We will recheck lipid profile since uptitration of Lipitor.  EKG did not show underlying ischemia or injury pattern.  Would recommend up titration of antianginal therapy at this time and continued efforts on improving his cardiovascular risk factors.  Patient is educated on seeking medical attention sooner if his chest pain increases in intensity, frequency, duration, or has typical chest pain as discussed in the office.  He verbalizes understanding.  Hypertension with insulin-dependent diabetes mellitus type 2 and chronic kidney disease stage II:  Systolic blood  pressure is currently at  goal.  Medications reconciled.  Low-salt diet recommended.  Insulin-dependent diabetes mellitus type 2 with hyperlipidemia:  Uptitrating Lipitor to 40 mg p.o. nightly (02/13/2020). Will repeat fasting lipids to re-evaluate treatment.  Did not endorse any  myalgias.  Dietary recommendations encouraged.  Family history of premature coronary artery disease: See above.  Former smoker: Educated on the importance of continued smoking cessation.   Orders Placed This Encounter  Procedures  . Lipid Panel With LDL/HDL Ratio  . EKG 12-Lead   --Continue cardiac medications as reconciled in final medication list. --Return in about 4 weeks (around 05/28/2020) for re-evaluation of symptoms.. Or sooner if needed. --Continue follow-up with your primary care physician regarding the management of your other chronic comorbid conditions.  Patient's questions and concerns were addressed to his satisfaction. He voices understanding of the instructions provided during this encounter.   This note was created using a voice recognition software as a result there may be grammatical errors inadvertently enclosed that do not reflect the nature of this encounter. Every attempt is made to correct such errors.  Rex Kras, [place], [place]  Pager: [phone] Office: [phone]

## 2020-05-01 ENCOUNTER — Telehealth: Payer: Self-pay | Admitting: Medical

## 2020-05-01 NOTE — Telephone Encounter (Signed)
Get in soon for fasting diabetes followup.

## 2020-05-07 ENCOUNTER — Ambulatory Visit: Payer: BC Managed Care – PPO | Admitting: Cardiology

## 2020-05-07 LAB — HM DIABETES EYE EXAM

## 2020-05-08 NOTE — Telephone Encounter (Signed)
Has this been handled?

## 2020-05-09 NOTE — Telephone Encounter (Signed)
Yes pt is scheduled to come in on the 17th

## 2020-05-13 ENCOUNTER — Telehealth: Payer: Self-pay | Admitting: Medical

## 2020-05-13 ENCOUNTER — Ambulatory Visit: Payer: BC Managed Care – PPO | Admitting: Medical

## 2020-05-13 ENCOUNTER — Other Ambulatory Visit: Payer: Self-pay

## 2020-05-13 ENCOUNTER — Encounter: Payer: Self-pay | Admitting: Medical

## 2020-05-13 VITALS — BP 102/60 | HR 73 | Temp 97.7°F | Ht 70.0 in | Wt 218.4 lb

## 2020-05-13 DIAGNOSIS — I2510 Atherosclerotic heart disease of native coronary artery without angina pectoris: Secondary | ICD-10-CM | POA: Diagnosis not present

## 2020-05-13 DIAGNOSIS — R7989 Other specified abnormal findings of blood chemistry: Secondary | ICD-10-CM

## 2020-05-13 DIAGNOSIS — E785 Hyperlipidemia, unspecified: Secondary | ICD-10-CM

## 2020-05-13 DIAGNOSIS — D751 Secondary polycythemia: Secondary | ICD-10-CM | POA: Insufficient documentation

## 2020-05-13 DIAGNOSIS — E1165 Type 2 diabetes mellitus with hyperglycemia: Secondary | ICD-10-CM | POA: Diagnosis not present

## 2020-05-13 DIAGNOSIS — F32A Depression, unspecified: Secondary | ICD-10-CM | POA: Insufficient documentation

## 2020-05-13 DIAGNOSIS — Z8249 Family history of ischemic heart disease and other diseases of the circulatory system: Secondary | ICD-10-CM

## 2020-05-13 DIAGNOSIS — E291 Testicular hypofunction: Secondary | ICD-10-CM

## 2020-05-13 DIAGNOSIS — I10 Essential (primary) hypertension: Secondary | ICD-10-CM

## 2020-05-13 DIAGNOSIS — Z79899 Other long term (current) drug therapy: Secondary | ICD-10-CM | POA: Diagnosis not present

## 2020-05-13 DIAGNOSIS — F329 Major depressive disorder, single episode, unspecified: Secondary | ICD-10-CM

## 2020-05-13 DIAGNOSIS — E669 Obesity, unspecified: Secondary | ICD-10-CM

## 2020-05-13 DIAGNOSIS — G4733 Obstructive sleep apnea (adult) (pediatric): Secondary | ICD-10-CM

## 2020-05-13 DIAGNOSIS — Z794 Long term (current) use of insulin: Secondary | ICD-10-CM

## 2020-05-13 DIAGNOSIS — F419 Anxiety disorder, unspecified: Secondary | ICD-10-CM

## 2020-05-13 MED ORDER — VORTIOXETINE HBR 20 MG PO TABS: 20.0000 mg | ORAL_TABLET | Freq: Every day | ORAL | 0 refills | Status: DC

## 2020-05-13 NOTE — Telephone Encounter (Signed)
Call [employer] to verify study medicaiton and whether this changed his lantus 80 u daily.  He is not currently using Lantus but is using Jardiance 25mg  daily

## 2020-05-13 NOTE — Progress Notes (Addendum)
Subjective: Chief Complaint  Patient presents with  . Diabetes    with fasting labs   Here for med check, diabetes follow-up  He has a history of hypertension, sleep apnea, hyperlipidemia, diabetes, testosterone.  Diabetes-compliant with Jardiance 25 mg daily.  Not sure what insulin or injectable medication he is taking currently as he is using medicaiton through drug study.  He recently enrolled in the diabetes study with [employer].    Checking glucose.  Numbers all over the place.   Not sure what his averages are.    Low testosterone, hypogonadism - still using testosterone injections at home every 2 weeks.  Still feels fatigued though  Depression - lately feels like he gets upset too easy.   The least little thing upsets him. Feels stressed, anxious.  Back of neck feels tense.   Currently using Trintellix 5mg  BID.     Thinks this needs to be increased  Having horrible time sleeping.  Feels exhausted.  Uses doxepin on weekends some.   The medication makes him too sedated to use during the weekdays.   Sees neurologist tomorrow.  Had sleep study fall 2020.   At that time neurology felt he didn't qualify for CPAP.   Has hx/o tonsillectomy and uvula tacked up to help snoring.  Feels like he still has significant sleep apnea despite test results.  Sees ENT soon as well about deviated septum.   Past Medical History:  Diagnosis Date  . Anxiety   . Coronary artery disease   . Depression   . Diabetes mellitus    Type 2  . Enlarged prostate   . Environmental allergies   . Environmental and seasonal allergies   . GERD (gastroesophageal reflux disease)   . Headache(784.0)    sinus and migraines  . Hyperlipidemia   . Hypertension   . IBS (irritable bowel syndrome)   . Low testosterone   . Sleep apnea    has used BiPAP in past; last sleep study >10 years   Current Outpatient Medications on File Prior to Visit  Medication Sig Dispense Refill  . amLODipine (NORVASC) 10 MG tablet Take 1  tablet (10 mg total) by mouth daily. 90 tablet 1  . atorvastatin (LIPITOR) 40 MG tablet Take 1 tablet (40 mg total) by mouth at bedtime. 90 tablet 3  . carvedilol (COREG) 12.5 MG tablet Take 1 tablet (12.5 mg total) by mouth 2 (two) times daily with a meal. Hold if systolic blood pressure (top blood pressure number) less than 100 mmHg or heart rate less than 60 bpm (pulse). 180 tablet 0  . cetirizine (ZYRTEC) 10 MG tablet Take 10 mg by mouth daily.    2021 doxepin (SINEQUAN) 10 MG capsule Take 1 capsule (10 mg total) by mouth at bedtime. 90 capsule 1  . empagliflozin (JARDIANCE) 25 MG TABS tablet Take 25 mg by mouth daily. 90 tablet 3  . fluticasone (FLONASE) 50 MCG/ACT nasal spray Place 2 sprays into both nostrils daily.    . hydrALAZINE (APRESOLINE) 25 MG tablet Take 1 tablet (25 mg total) by mouth 3 (three) times daily. 270 tablet 1  . Insulin Pen Needle (BD PEN NEEDLE NANO U/F) 32G X 4 MM MISC 1 each by Does not apply route at bedtime. 100 each 11  . lisinopril (ZESTRIL) 40 MG tablet Take 1 tablet (40 mg total) by mouth daily. 90 tablet 1  . Needles & Syringes MISC As directed 92ml of testosterone every 14 days 100 each 5  . tamsulosin (  FLOMAX) 0.4 MG CAPS capsule Take 1 capsule (0.4 mg total) by mouth daily. 90 capsule 3  . testosterone cypionate (DEPOTESTOSTERONE CYPIONATE) 200 MG/ML injection Inject 1 mL (200 mg total) into the muscle every 14 (fourteen) days. 30 mL 3  . aspirin EC 81 MG tablet Take 81 mg by mouth daily.    . insulin glargine (LANTUS) 100 UNIT/ML injection Inject 0.8 mLs (80 Units total) into the skin daily. 3 mo supply (Patient not taking: Reported on 05/13/2020) 80 mL 3  . verapamil (CALAN) 120 MG tablet Take 1 tablet (120 mg total) by mouth once for 1 dose. 90 tablet 1   No current facility-administered medications on file prior to visit.   ROS as in subjective      Objective: BP 102/60   Pulse 73   Temp 97.7 F (36.5 C)   Ht 5\' 10"  (1.778 m)   Wt 218 lb 6.4 oz  (99.1 kg)   SpO2 93%   BMI 31.34 kg/m   General appearence: alert, no distress, WD/WN,  Neck: supple, no lymphadenopathy, no thyromegaly, no masses Heart: RRR, normal S1, S2, no murmurs Lungs: CTA bilaterally, no wheezes, rhonchi, or rales Pulses: 2+ symmetric, upper and lower extremities, normal cap refill Ext: no edema  Diabetic Foot Exam - Simple   Simple Foot Form Diabetic Foot exam was performed with the following findings: Yes 05/13/2020  9:57 AM  Visual Inspection See comments: Yes Sensation Testing Intact to touch and monofilament testing bilaterally: Yes Pulse Check Posterior Tibialis and Dorsalis pulse intact bilaterally: Yes Comments Callous bilat medial distal phalanx of great toes, healing scab on volar surface of right great toe distal phalanx., no other lesions     I reviewed his recent basic metabolic panel from April 01, 2020 showing elevated blood sugar otherwise kidney marker is normal sodium slightly elevated but other electrolytes normal.   Assessment: Encounter Diagnoses  Name Primary?  . Type 2 diabetes mellitus with hyperglycemia, with long-term current use of insulin (HCC) Yes  . Essential hypertension, benign   . Obstructive sleep apnea syndrome   . Hypogonadism in male   . Low testosterone   . Family history of premature CAD   . Coronary artery disease involving native heart without angina pectoris, unspecified vessel or lesion type   . Obesity, unspecified classification, unspecified obesity type, unspecified whether serious comorbidity present   . Hyperlipidemia, unspecified hyperlipidemia type   . High risk medication use   . Erythrocytosis   . Anxiety and depression      Plan: Diabetes-labs today.  Micro albumin normal in February 2021 Hemoglobin A1c 3 months ago was 8.4%.  Improved from the last year but not at goal.  Repeat labs today. I will check with drug study about whether then had him change from Lantus 80u daily which is is not  taking now.   He is compliance with Jardiance 25mg  daily. Advised daily foot check Advised yearly eye doctor visit and reminded him to have eye doctor send March 2021 copy of records  Continue glucose monitoring   Hypogonadism/low testosterone-he continues on testosterone injections every 2 weeks.  I suspect we may need to change modify therapy given recent elevated RBCs on last labs.   Erythrocytosis- His red cells and hemoglobin and hematocrit were elevated in February.  Repeat labs today.  This is likely related to testosterone therapy so he may need to modify therapy.  Previously his labs have been normal for blood counts  CAD with precordial chest  pain, hypertension, hyperlipidemia-I reviewed his cardiology notes from Apr 30, 2020.  His carvedilol was recently increased to 0.5 mg twice daily.  He was continued on aspirin and statin.  His blood pressure was at goal.  He recently had a lipid panel per cardiology has the increased titration of Lipitor.  He is suppose to f/u with cardiology in June  He recently had a cardiac CTA with FFR-no hemodynamically significant lesions in the LAD, advised medical treatment and aggressive risk factor modification  Stress test March 2021 was non diagnostic with Lexiscan, hypertension noted during the scan, high risk study.  Echocardiogram March 2021 showing LVEF 60%, mild concentric hypertrophy of left ventricle, grade 1 diastolic impairment, mild MR  hyperlipdieimai - repeat lab today since his Lipitor dose was titrated up by cardiology 01/2020.  Hx/o OSA, insomnia, fatigue - he sees neurology tomorrow, will discuss with them medicaiton, CPAP, other recommendations  Depression - he will increase to 20mg  daily with samples . He will use 5mg  + 10mg  tablet to equal 15mg  for 1 week then increase to 20mg  daily.   He will call in 2 weeks to let me know if Trintellix is helping at the higher dose.    Kahner was seen today for diabetes.  Diagnoses and all orders for  this visit:  Type 2 diabetes mellitus with hyperglycemia, with long-term current use of insulin (HCC) -     Hemoglobin A1c  Essential hypertension, benign  Obstructive sleep apnea syndrome -     CBC with Differential/Platelet  Hypogonadism in male -     CBC with Differential/Platelet -     Testosterone  Low testosterone -     CBC with Differential/Platelet -     Testosterone  Family history of premature CAD  Coronary artery disease involving native heart without angina pectoris, unspecified vessel or lesion type -     Lipid panel  Obesity, unspecified classification, unspecified obesity type, unspecified whether serious comorbidity present  Hyperlipidemia, unspecified hyperlipidemia type -     Lipid panel  High risk medication use -     CBC with Differential/Platelet -     Testosterone -     ALT  Erythrocytosis  Anxiety and depression  Other orders -     vortioxetine HBr (TRINTELLIX) 20 MG TABS tablet; Take 1 tablet (20 mg total) by mouth daily.

## 2020-05-13 NOTE — Addendum Note (Signed)
Addended by: Jac Canavan on: 05/13/2020 01:01 PM   Modules accepted: Orders, Level of Service

## 2020-05-14 ENCOUNTER — Encounter: Payer: Self-pay | Admitting: Neurology

## 2020-05-14 ENCOUNTER — Ambulatory Visit: Payer: BC Managed Care – PPO | Admitting: Neurology

## 2020-05-14 VITALS — BP 131/80 | HR 73 | Ht 70.0 in | Wt 217.0 lb

## 2020-05-14 DIAGNOSIS — G2581 Restless legs syndrome: Secondary | ICD-10-CM

## 2020-05-14 DIAGNOSIS — E1165 Type 2 diabetes mellitus with hyperglycemia: Secondary | ICD-10-CM | POA: Diagnosis not present

## 2020-05-14 DIAGNOSIS — R0683 Snoring: Secondary | ICD-10-CM

## 2020-05-14 DIAGNOSIS — Z794 Long term (current) use of insulin: Secondary | ICD-10-CM | POA: Diagnosis not present

## 2020-05-14 DIAGNOSIS — J309 Allergic rhinitis, unspecified: Secondary | ICD-10-CM

## 2020-05-14 DIAGNOSIS — Z789 Other specified health status: Secondary | ICD-10-CM

## 2020-05-14 DIAGNOSIS — G4733 Obstructive sleep apnea (adult) (pediatric): Secondary | ICD-10-CM | POA: Diagnosis not present

## 2020-05-14 DIAGNOSIS — I10 Essential (primary) hypertension: Secondary | ICD-10-CM

## 2020-05-14 LAB — CBC WITH DIFFERENTIAL/PLATELET
Basophils Absolute: 0.1 10*3/uL (ref 0.0–0.2)
Basos: 1 %
EOS (ABSOLUTE): 0.3 10*3/uL (ref 0.0–0.4)
Eos: 3 %
Hematocrit: 51.5 % — ABNORMAL HIGH (ref 37.5–51.0)
Hemoglobin: 17.7 g/dL (ref 13.0–17.7)
Immature Grans (Abs): 0.1 10*3/uL (ref 0.0–0.1)
Immature Granulocytes: 1 %
Lymphocytes Absolute: 2.9 10*3/uL (ref 0.7–3.1)
Lymphs: 30 %
MCH: 31.1 pg (ref 26.6–33.0)
MCHC: 34.4 g/dL (ref 31.5–35.7)
MCV: 91 fL (ref 79–97)
Monocytes Absolute: 0.8 10*3/uL (ref 0.1–0.9)
Monocytes: 8 %
Neutrophils Absolute: 5.7 10*3/uL (ref 1.4–7.0)
Neutrophils: 57 %
Platelets: 254 10*3/uL (ref 150–450)
RBC: 5.69 x10E6/uL (ref 4.14–5.80)
RDW: 14.6 % (ref 11.6–15.4)
WBC: 9.8 10*3/uL (ref 3.4–10.8)

## 2020-05-14 LAB — LIPID PANEL
Chol/HDL Ratio: 4.4 ratio (ref 0.0–5.0)
Cholesterol, Total: 128 mg/dL (ref 100–199)
HDL: 29 mg/dL — ABNORMAL LOW (ref >39)
LDL Chol Calc (NIH): 71 mg/dL (ref 0–99)
Triglycerides: 159 mg/dL — ABNORMAL HIGH (ref 0–149)
VLDL Cholesterol Cal: 28 mg/dL (ref 5–40)

## 2020-05-14 LAB — ALT: ALT: 22 IU/L (ref 0–44)

## 2020-05-14 LAB — HEMOGLOBIN A1C
Est. average glucose Bld gHb Est-mCnc: 209 mg/dL
Hgb A1c MFr Bld: 8.9 % — ABNORMAL HIGH (ref 4.8–5.6)

## 2020-05-14 LAB — TESTOSTERONE: Testosterone: 555 ng/dL (ref 264–916)

## 2020-05-14 MED ORDER — ROPINIROLE HCL 0.25 MG PO TABS: 0.2500 mg | ORAL_TABLET | Freq: Every day | ORAL | 5 refills | Status: DC

## 2020-05-14 NOTE — Telephone Encounter (Signed)
I sent email to Rosanna Randy at [facility] inquiring abut the study and any changes to his diabetes medicaiton.  Awaiting reply back.

## 2020-05-14 NOTE — Progress Notes (Signed)
SLEEP MEDICINE CLINIC   Provider:  Larey Seat, M D  Primary Care Physician:  Carlena Hurl, PA-C   Referring Provider:  Rexene Alberts, DO, at [facility]    Chief Complaint  Patient presents with  . Follow-up    pt alone, rm 11. presents today following up. pt has had previous SS, last was 07/2019 where titration study was recommended but patient declined due to unable to tolerate in the last. at that time patient was also advised was not a inspire cantidate.    HPI:   [name] [name] is a 59 y.o. male patient  Who was  seen here upon referral  from Dr. Rexene Alberts for a sleep apnea re- evaluation. 05-13-2020. He returns for a discussion of the recently obtained HST and attended sleep study. [name] [name] has since we last met in person progressed to feel extremely sleep deprived to have hypersomnia, he noted irritability, he had more recently a return to restless leg symptoms which has affected his sleep latency.  And he is also questioning why his sleep studies with [place] the home sleep test as well as his expanded EEG sleep test showed such mild apnea in comparison to what he was diagnosed with before.  He did have a sleep study next to [place] at the [facility] sleep center but this is not pancreatic accessible, he did have another sleep study at [facility] and sleep which is now closed for about 4 5 years.  I will try to get the records. He remembers vividly but he was told he had 118 apneas per hour.  We obtained a home sleep test in June 2020 there was very mild apnea noted his AHI was 5.1 slightly higher was his RDI which is snoring rhythm, there was no hypoxemia.  However home sleep tests are more likely to be reporting artifact or misinterpreting data asleep when the patient is not asleep vice versa.  He had a lot of spontaneous arousals.  This was followed by a attended sleep study on 31 July 2019 at this time his AHI was 14.3 with RDI was 16.3 and he slept none of the  recorded time in supine position.  Time in desaturation was 25 minutes of the total sleep time sleep efficiency was rather poor he only slept for about 67% of the recorded night.  Would seem to have been most affecting his sleep were frequent periodic limb movements kicking and his arousal index was scored at 8.1/h almost as high as the arousals from apnea.  He also needed a long time to actually go to sleep and almost 1-1/2 hours before he could enter REM sleep.  Patient advised Lilia Pro of his history of CPAP intolerance that is why he has undergone so many other procedures after he learned that he could not tolerate CPAP.  He also likes to sleep prone which interferes with his CPAP mask.           Chief complaint according to patient : I am soo sleepy, so very tired, HYPERSOMNIA . Sleep and medical history: [name] [name] reports having been tested at  [facility] and later at [facility] center (over 5 years ago ) - he remembers being diagnosed with 118 apneas per hour, and that he had his first sleep study taken in a seated position due to orthopnea. He started off on his CPAP could not tolerated was changed to a BiPAP and still could  not tolerate it.  His positive airway pressure intolerance was closely linked to a condition of chronic sinusitis, sinus congestion and a known seasonal pattern. He underwent a tonsillectomy but his sinus headaches responded, his nasal airflow was still restricted.  At the [facility] and asthma center it was documented that he had a tonsillectomy and uvuloplasty, but he continues to have very poor quality sleep, that he is using fluticasone nasal spray and that he tested positive for mold and certain tree allergens.  His obstructive sleep apnea syndrome has been untreated for many years now.  He also has sacroiliitis, radiculopathy which was not further specified degeneration of the lumbar or lumbosacral intervertebral discs were documented. DM2,  anxiety.   Family sleep history: [name] [name] reports that his father and brother were very loud snores and may well have had apnea without being tested.   Social history: lives alone, nobody witnessed his sleep. 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 Forme smoker, ETOH socially- caffeine iced coffee one a day.   Sleep habits are as follows: Dinner time is a snack at 9.30 PM, bedtime changed with new job. mostly 10- PM and he reads to go to sleep.  He now has restless legs and is asleep by 11 ,  Sleeps prone  , on  1 pillow -  4 hours of sleep only.   he is not sure if he dreams, seemingly rare.  Alarm set 3.30 AM, not refreshed not restored.  Often with headaches but mayorly improved after surgery - 50 % of the mornings he gets up with no problem.  Naps in daytime- before lunch , after lunch after 3 espressi. TST at night 4 hours and in daytime another 2 hours, many times.      Review of Systems: Out of a complete 14 system review, the patient complains of only the following symptoms, and all other reviewed systems are negative. How likely are you to doze in the following situations: 0 = not likely, 1 = slight chance, 2 = moderate chance, 3 = high chance  Sitting and Reading? Watching Television? Sitting inactive in a public place (theater or meeting)? Lying down in the afternoon when circumstances permit? Sitting and talking to someone? Sitting quietly after lunch without alcohol? In a car, while stopped for a few minutes in traffic? As a passenger in a car for an hour without a break?  Total =15/ 24 , with naps its closer to 10/ 24. FSS at 51/ 63 points.   see corona virus related fears.    Depression  sleep deprivation, RLS.    Social History   Socioeconomic History  . Marital status: Single    Spouse name: Not on file  . Number of children: Not on file  . Years of education: Not on file  . Highest education level: Not on file  Occupational History  . Not on file    Tobacco Use  . Smoking status: Former Smoker    Packs/day: 1.00    Years: 10.00    Pack years: 10.00    Quit date: 04/20/1992    Years since quitting: 28.0  . Smokeless tobacco: Never Used  Substance and Sexual Activity  . Alcohol use: No  . Drug use: No  . Sexual activity: Not on file  Other Topics Concern  . Not on file  Social History Narrative  . Not on file   Social Determinants of Health   Financial Resource Strain:   . Difficulty  of Paying Living Expenses:   Food Insecurity:   . Worried About Charity fundraiser in the Last Year:   . Arboriculturist in the Last Year:   Transportation Needs:   . Film/video editor (Medical):   Marland Kitchen Lack of Transportation (Non-Medical):   Physical Activity:   . Days of Exercise per Week:   . Minutes of Exercise per Session:   Stress:   . Feeling of Stress :   Social Connections:   . Frequency of Communication with Friends and Family:   . Frequency of Social Gatherings with Friends and Family:   . Attends Religious Services:   . Active Member of Clubs or Organizations:   . Attends Archivist Meetings:   Marland Kitchen Marital Status:   Intimate Partner Violence:   . Fear of Current or Ex-Partner:   . Emotionally Abused:   Marland Kitchen Physically Abused:   . Sexually Abused:     Family History  Problem Relation Age of Onset  . Cancer Mother        lung  . Cancer Father        prostate, mets to bone, lung  . Cancer Brother        melanoma  . Heart disease Maternal Grandmother        MI  . Lung disease Maternal Grandfather        black lung  . Heart disease Paternal Grandmother   . Stroke Paternal Grandmother   . Heart disease Paternal Grandfather   . Heart disease Brother 29       MI  . Diabetes Brother     Past Medical History:  Diagnosis Date  . Anxiety   . Coronary artery disease   . Depression   . Diabetes mellitus    Type 2  . Enlarged prostate   . Environmental allergies   . Environmental and seasonal allergies    . GERD (gastroesophageal reflux disease)   . Headache(784.0)    sinus and migraines  . Hyperlipidemia   . Hypertension   . IBS (irritable bowel syndrome)   . Low testosterone   . Sleep apnea    has used BiPAP in past; last sleep study >10 years    Past Surgical History:  Procedure Laterality Date  . COLONOSCOPY  09/2018   normal, repeat in 10 years per patient.  [place], [place]  . KNEE ARTHROSCOPY Left   . SACROILIAC JOINT FUSION Right 05/02/2014   Procedure: SACROILIAC JOINT FUSION;  Surgeon: Sinclair Ship, MD;  Location: [facility];  Service: Orthopedics;  Laterality: Right;  Right sided sacroiliac joint fusion  . TONSILLECTOMY     uvula removed    Current Outpatient Medications  Medication Sig Dispense Refill  . amLODipine (NORVASC) 10 MG tablet Take 10 mg by mouth daily.    Marland Kitchen atorvastatin (LIPITOR) 40 MG tablet Take 1 tablet (40 mg total) by mouth at bedtime. 90 tablet 3  . carvedilol (COREG) 12.5 MG tablet Take 1 tablet (12.5 mg total) by mouth 2 (two) times daily with a meal. Hold if systolic blood pressure (top blood pressure number) less than 100 mmHg or heart rate less than 60 bpm (pulse). 180 tablet 0  . cetirizine (ZYRTEC) 10 MG tablet Take 10 mg by mouth daily.    Marland Kitchen doxepin (SINEQUAN) 10 MG capsule Take 1 capsule (10 mg total) by mouth at bedtime. 90 capsule 1  . empagliflozin (JARDIANCE) 25 MG TABS tablet Take 25 mg by mouth  daily. 90 tablet 3  . fluticasone (FLONASE) 50 MCG/ACT nasal spray Place 2 sprays into both nostrils daily.    . hydrALAZINE (APRESOLINE) 25 MG tablet Take 1 tablet (25 mg total) by mouth 3 (three) times daily. 270 tablet 1  . insulin glargine (LANTUS) 100 UNIT/ML injection Inject 0.8 mLs (80 Units total) into the skin daily. 3 mo supply 80 mL 3  . Insulin Pen Needle (BD PEN NEEDLE NANO U/F) 32G X 4 MM MISC 1 each by Does not apply route at bedtime. 100 each 11  . lisinopril (ZESTRIL) 40 MG tablet Take 1 tablet (40 mg total) by mouth daily. 90 tablet  1  . Needles & Syringes MISC As directed 54m of testosterone every 14 days 100 each 5  . tamsulosin (FLOMAX) 0.4 MG CAPS capsule Take 1 capsule (0.4 mg total) by mouth daily. 90 capsule 3  . testosterone cypionate (DEPOTESTOSTERONE CYPIONATE) 200 MG/ML injection Inject 1 mL (200 mg total) into the muscle every 14 (fourteen) days. 30 mL 3  . vortioxetine HBr (TRINTELLIX) 20 MG TABS tablet Take 1 tablet (20 mg total) by mouth daily. 21 tablet 0  . verapamil (CALAN) 120 MG tablet Take 1 tablet (120 mg total) by mouth once for 1 dose. 90 tablet 1   No current facility-administered medications for this visit.    Allergies as of 05/14/2020 - Review Complete 05/14/2020  Allergen Reaction Noted  . Pravastatin Other (See Comments) 11/29/2011  . Trazodone and nefazodone  05/13/2020  . Hydrochlorothiazide Other (See Comments) 03/31/2019  . Metformin and related Diarrhea 03/31/2019    Vitals: BP 131/80   Pulse 73   Ht '5\' 10"'  (1.778 m)   Wt 217 lb (98.4 kg)   BMI 31.14 kg/m  Last Weight:  Wt Readings from Last 1 Encounters:  05/14/20 217 lb (98.4 kg)   BOEU:MPNTmass index is 31.14 kg/m.     Last Height:   Ht Readings from Last 1 Encounters:  05/14/20 '5\' 10"'  (1.778 m)    Physical exam:  General: The patient is awake, alert and appears not in acute distress. The patient is well groomed. Head: Normocephalic, atraumatic. Neck is supple. Mallampati  Status post UPPP, 2,  neck circumference:18. 25 ".  Nasal airflow is sestricted when lying down- , patent while seated.  Retrognathia is not seen.  Cardiovascular:  Regular rate and rhythm , without  murmurs or carotid bruit, and without distended neck veins. Respiratory: Lungs are clear to auscultation. Skin:  Facial reddening.  Trunk: BMI is 32.5 . The patient's posture is relaxed  Neurologic exam : The patient is awake and alert, oriented to place and time.    Attention span & concentration ability appears normal.  Speech is fluent,   without dysarthria, dysphonia or aphasia.  Mood and affect are anxious, and irritable.   Cranial nerves: Pupils are equal and briskly reactive to light. Funduscopic exam without  evidence of pallor or edema. Extraocular movements  in vertical and horizontal planes intact and without nystagmus.  Visual fields by finger perimetry are intact. Hearing to finger rub intact.   Facial sensation intact to fine touch.  Facial motor strength is symmetric and tongue and uvula move midline. Shoulder shrug was symmetrical.   Motor exam: Normal tone, muscle bulk and symmetric strength in all extremities. Sensory:  Fine touch, pinprick and vibration were normal. Numbness in his toes. No numbness in the fingertips.    Assessment:  After physical and neurologic examination, review of laboratory  studies,  Personal review of imaging studies, reports of other /same  Imaging studies, results of polysomnography and / or neurophysiology testing and pre-existing records as far as provided in visit., my assessment is   1)   excessive daytime sleepiness. status post UPPP, but not septo-plasty nor turbinate reduction.   Has now rater mild apnea , still needs treatment, is reportedly CPAP intolerant.   2) PLM severe and RLS clinical concern- needs to be treated for PLMs, needs ferritin, TIBC and Iron saturation checked.   Has DM 2 , not well controlled. 8.9 yesterday.  Neuropathy can be cause of RLS.      The patient was advised of the nature of the diagnosed disorder , the treatment options and the  risks for general health and wellness arising from not treating the condition.   I spent more than 35 minutes of face to face time with the patient.  Greater than 50% of time was spent in counseling and coordination of care. We have discussed the diagnosis and differential and I answered the patient's questions.    Plan:  Treatment plan and additional workup   1) Sleep study to confirm presence of OSA at milder  level. The patient had last tried 6 years ago to tolerate CPAP and have failed to tolerate several masks and different interfaces.  I would like to obtain an iron metabolic studies today, I would like also for the patient to start on a dopaminergic agonist to help suppress restless legs and hopefully allow him a more coherent and sustain sleep pattern.   As to his apnea it is less likely to respond to a dental device however I do think that a dental device could reduce the apnea and as it is at baseline now rather mild it may actually push the residual AHI to a more tolerable level.  TIBC, Ferritin. Requip low dose at bedtime.  Start dental referral to Dr Ron Parker or Dr Toy Cookey.   Larey Seat, MD 1/54/0086, 7:61 PM  Certified in Neurology by ABPN Certified in [place] by [facility] [address] [place], [place] [address]

## 2020-05-14 NOTE — Addendum Note (Signed)
Addended by: Melvyn Novas on: 05/14/2020 04:32 PM   Modules accepted: Orders

## 2020-05-14 NOTE — Patient Instructions (Signed)

## 2020-05-15 ENCOUNTER — Encounter: Payer: Self-pay | Admitting: Neurology

## 2020-05-15 NOTE — Progress Notes (Signed)
Iron saturation is low normal. Ferritin was 103, can also be elevated as an inflammatory marker. I recommend no iron supplementation at this time, as it is not the cause of RLS/ PLMs.

## 2020-05-16 ENCOUNTER — Other Ambulatory Visit: Payer: Self-pay | Admitting: Medical

## 2020-05-16 MED ORDER — TRESIBA FLEXTOUCH 100 UNIT/ML ~~LOC~~ SOPN: 70.0000 [IU] | PEN_INJECTOR | Freq: Every day | SUBCUTANEOUS | 0 refills | Status: DC

## 2020-05-16 NOTE — Telephone Encounter (Signed)
I spoke with [name] [name] from [place].     Per [name] [name]... He is involved in a diabetes study.  At randomization [name] [name] was switched from Toujeo to Insulin degludec. (He was randomized to the once daily rather than the once weekly insulin in [employer] study.)  He started out at 75 units on March 30.  We have been titrating his dose weekly and he is currently taking 93 units. His next weekly visit is later today.  His first weekly average fasting BG was 201.  Last week it was 170.

## 2020-05-21 ENCOUNTER — Telehealth: Payer: Self-pay | Admitting: Neurology

## 2020-05-21 LAB — IRON,TIBC AND FERRITIN PANEL
Ferritin: 103 ng/mL (ref 30–400)
Iron Saturation: 16 % (ref 15–55)
Iron: 49 ug/dL (ref 38–169)
Total Iron Binding Capacity: 309 ug/dL (ref 250–450)
UIBC: 260 ug/dL (ref 111–343)

## 2020-05-21 LAB — ALDOSTERONE + RENIN ACTIVITY W/ RATIO
ALDOS/RENIN RATIO: 8.4 (ref 0.0–30.0)
ALDOSTERONE: 6.4 ng/dL (ref 0.0–30.0)
Renin: 0.766 ng/mL/hr (ref 0.167–5.380)

## 2020-05-21 NOTE — Telephone Encounter (Signed)
-----   Message from Melvyn Novas, MD sent at 05/15/2020  8:31 AM EDT ----- Iron saturation is low normal. Ferritin was 103, can also be elevated as an inflammatory marker. I recommend no iron supplementation at this time, as it is not the cause of RLS/ PLMs.

## 2020-05-21 NOTE — Telephone Encounter (Signed)
Called the patient to review the normal lab findings. There was no answer. LVM advising the patient that iron levels were in normal range as well as aldosterone levels. Advised if there was any questions the patient could call back.

## 2020-05-21 NOTE — Progress Notes (Signed)
Normal renin /aldosterone level, too.

## 2020-05-23 ENCOUNTER — Ambulatory Visit (HOSPITAL_COMMUNITY)
Admission: RE | Admit: 2020-05-23 | Discharge: 2020-05-23 | Disposition: A | Discharge status: Home / Self Care | Payer: BC Managed Care – PPO | Source: Ambulatory Visit | Attending: Neurology | Admitting: Neurology

## 2020-05-23 DIAGNOSIS — Z794 Long term (current) use of insulin: Secondary | ICD-10-CM | POA: Insufficient documentation

## 2020-05-23 DIAGNOSIS — I10 Essential (primary) hypertension: Secondary | ICD-10-CM | POA: Diagnosis not present

## 2020-05-23 DIAGNOSIS — E1165 Type 2 diabetes mellitus with hyperglycemia: Secondary | ICD-10-CM | POA: Diagnosis not present

## 2020-05-23 DIAGNOSIS — G2581 Restless legs syndrome: Secondary | ICD-10-CM | POA: Diagnosis not present

## 2020-05-23 DIAGNOSIS — G4733 Obstructive sleep apnea (adult) (pediatric): Secondary | ICD-10-CM | POA: Diagnosis not present

## 2020-05-23 NOTE — Progress Notes (Signed)
Hx: Patient on multiple antihypertensive medications, still poorly controlled BP.   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   This could be significant stenosis (>50% luminal narrowing).

## 2020-05-24 ENCOUNTER — Telehealth: Payer: Self-pay | Admitting: Medical

## 2020-05-24 ENCOUNTER — Other Ambulatory Visit: Payer: Self-pay | Admitting: Medical

## 2020-05-24 MED ORDER — VORTIOXETINE HBR 20 MG PO TABS: 20.0000 mg | ORAL_TABLET | Freq: Every day | ORAL | 1 refills | Status: DC

## 2020-05-24 NOTE — Telephone Encounter (Signed)
Pt said he was told to call if he wanted the Trintellix increased and he does. He wants a 90 day supply called into the [facility] on [address]

## 2020-05-24 NOTE — Telephone Encounter (Signed)
Medication sent. F/u 6 -8 weeks on depression

## 2020-05-28 IMAGING — CT CT HEART MORP W/ CTA COR W/ SCORE W/ CA W/CM &/OR W/O CM
4 of 7 series · 8 of 20 positions shown, 9 images · IV contrast (APPLIED)
Comparison: None.
COMPARISON: None.

Addendum:
EXAM:
OVER-READ INTERPRETATION  CT CHEST

The following report is an over-read performed by radiologist Dr.
over-read does not include interpretation of cardiac or coronary
anatomy or pathology. The coronary CTA interpretation by the
cardiologist is attached.
CLINICAL DATA: Chest pain
Cardiac CTA
MEDICATIONS:
Sub lingual nitro. 4 mg and lopressor 5mg iv
TECHNIQUE: The patient was scanned on a Siemens Force [id number] scanner. Gantry
rotation speed was 250 msecs. Collimation was. 6 mm . A 120 kV
prospective scan was triggered in the ascending thoracic aorta at
140 HU's with full mA between 30-70% of the R-R interval . Average
HR during the scan was 61 bpm. The 3D data set was interpreted on a
dedicated work station using MPR, MIP and VRT modes. A total of 80
cc of contrast was used.

[Series 6: best diast 74 % · axial · 0.39mm/px · z∈[-153,-114]mm · 2 of 293 slices shown]
[im 98/293  vessel]
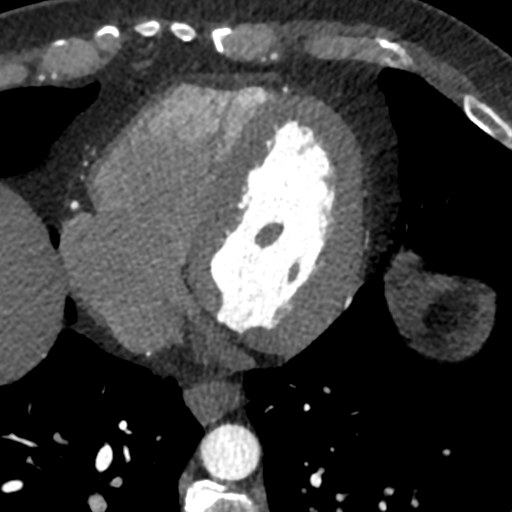
[im 195/293  vessel]
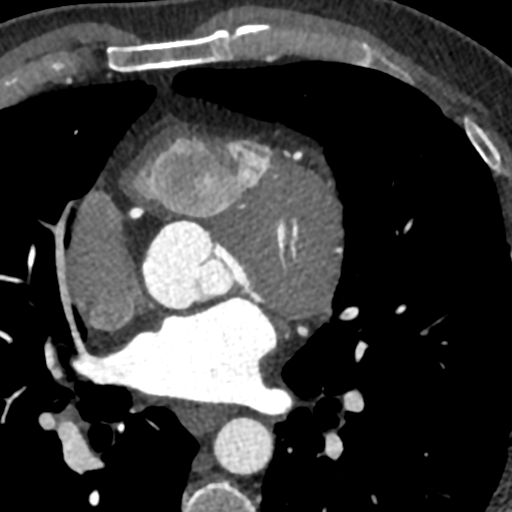

[Series 7: best syst · axial · 0.39mm/px · z∈[-153,-114]mm · 2 of 293 slices shown, 3 images]
[im 98/293  vessel]
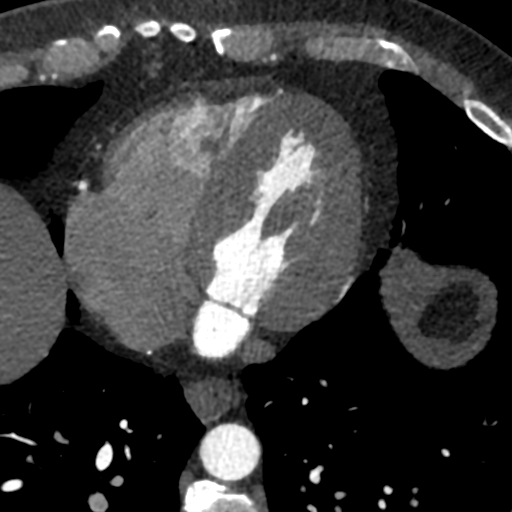
[im 98/293  lung]
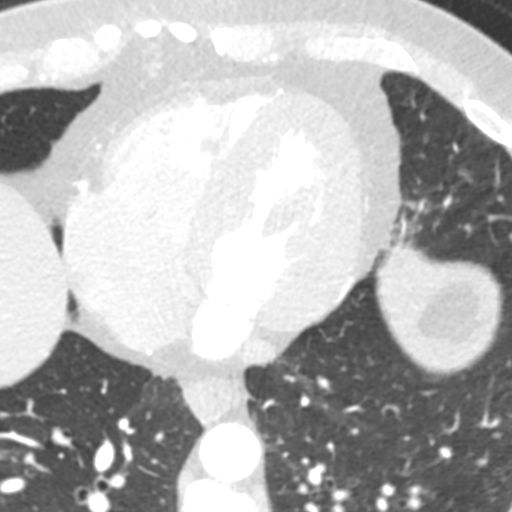
[im 195/293  vessel]
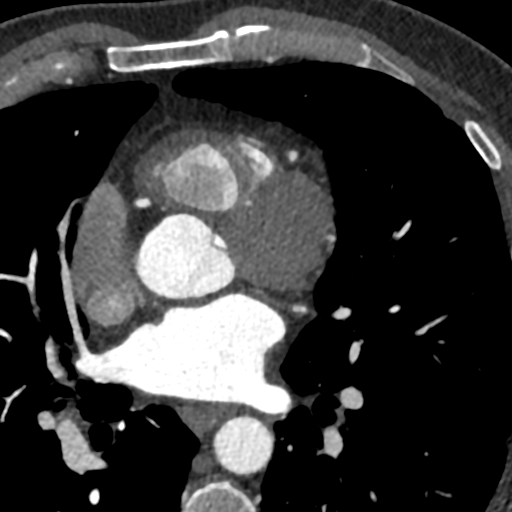

[Series 9: ts diast sharp · axial · 0.39mm/px · z∈[-153,-114]mm · 2 of 293 slices shown]
[im 98/293  lung]
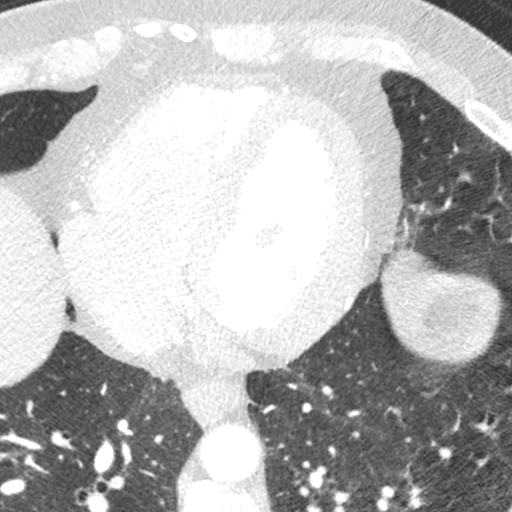
[im 195/293  lung]
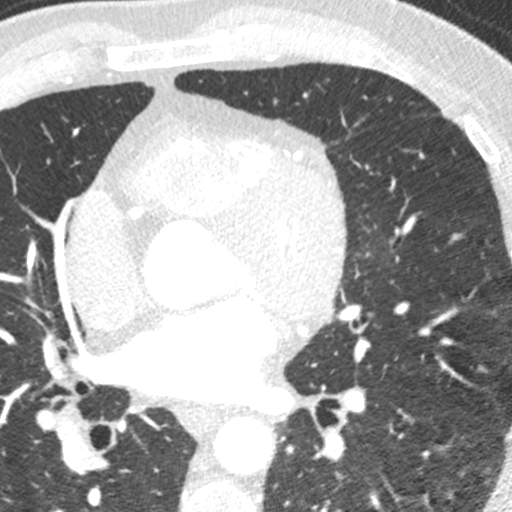

[Series 10: ts syst sharp · axial · 0.39mm/px · z∈[-153,-114]mm · 2 of 293 slices shown]
[im 98/293  lung]
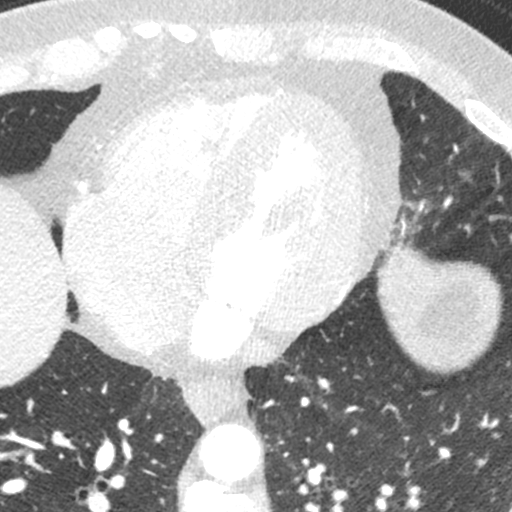
[im 195/293  lung]
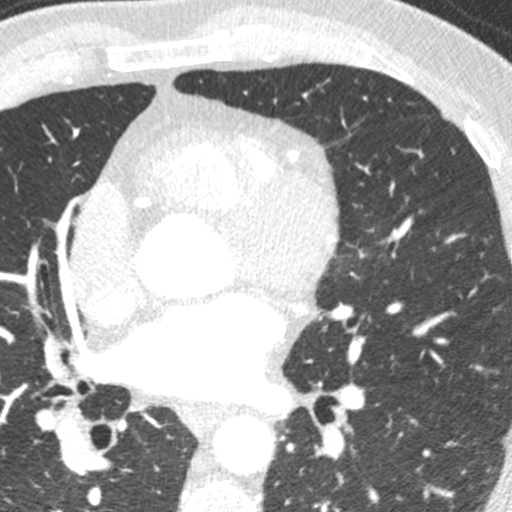

[8 of 20 positions shown; findings below may reference images not displayed]

FINDINGS: Vascular: No incidental findings.

Mediastinum/Nodes: Visualized mediastinum and hilar regions
demonstrate no lymphadenopathy or masses. Suggestion of mild
circumferential thickening of the distal esophagus with probable
small hiatal hernia. Correlation suggested with any symptoms chronic
gastroesophageal reflux or esophageal inflammation.

Lungs/Pleura: Visualized lungs show no evidence of pulmonary edema,
consolidation, pneumothorax, nodule or pleural fluid.

Upper Abdomen: No acute abnormality.

Musculoskeletal: No chest wall mass or suspicious bone lesions
identified.
IMPRESSION: Suggestion of mild circumferential thickening of the distal
esophagus with probable small hiatal hernia. Correlation suggested
with any symptoms of chronic gastroesophageal reflux or esophageal
inflammation.
FINDINGS: Non-cardiac: See separate report from [REDACTED]. No
significant findings on limited lung and soft tissue windows.

Calcium score: 3 vessel calcium noted most prominent in LAD

Coronary Arteries: Right dominant with no anomalies

LM: Normal

LAD: 50-69% mixed plaque with prominent soft plaque component in
proximal vessel 50-69% calcific plaque in mid/distal vessel

D1: 25-49% soft plaque in ostial vessel

D2: Small vessel 50-69% ostial calcific plaque

D3: Small vessel

Circumflex: 1-24% soft plaque in mid vessel

OM1: Normal

AV Groove: Normal

RCA: 1-24% calcified ostial stenosis

PDA: Normal

PLB: Normal
IMPRESSION: 1. Calcium score 95 which is 76 th percentile for age and sex

2.  Normal aortic root 2.9 cm

3.  CAD CAD RADS 3 most significant in LAD and diagonal

4.  Study sent for FFR CT

Mpho Olerile Kunene Hakamo

*** End of Addendum ***
EXAM:
OVER-READ INTERPRETATION  CT CHEST

The following report is an over-read performed by radiologist Dr.
over-read does not include interpretation of cardiac or coronary
anatomy or pathology. The coronary CTA interpretation by the
cardiologist is attached.
FINDINGS: Vascular: No incidental findings.

Mediastinum/Nodes: Visualized mediastinum and hilar regions
demonstrate no lymphadenopathy or masses. Suggestion of mild
circumferential thickening of the distal esophagus with probable
small hiatal hernia. Correlation suggested with any symptoms chronic
gastroesophageal reflux or esophageal inflammation.

Lungs/Pleura: Visualized lungs show no evidence of pulmonary edema,
consolidation, pneumothorax, nodule or pleural fluid.

Upper Abdomen: No acute abnormality.

Musculoskeletal: No chest wall mass or suspicious bone lesions
identified.
IMPRESSION: Suggestion of mild circumferential thickening of the distal
esophagus with probable small hiatal hernia. Correlation suggested
with any symptoms of chronic gastroesophageal reflux or esophageal
inflammation.

## 2020-05-28 NOTE — Telephone Encounter (Signed)
Sent message via [facility] to call and schedule an appointment.

## 2020-05-31 ENCOUNTER — Other Ambulatory Visit: Payer: Self-pay | Admitting: Medical

## 2020-05-31 ENCOUNTER — Telehealth: Payer: Self-pay | Admitting: Medical

## 2020-05-31 MED ORDER — VORTIOXETINE HBR 20 MG PO TABS: 20.0000 mg | ORAL_TABLET | Freq: Every day | ORAL | 0 refills | Status: DC

## 2020-05-31 NOTE — Telephone Encounter (Signed)
Please review previous message. 

## 2020-05-31 NOTE — Progress Notes (Signed)
[name] [name],  Is there a good number to call you?  ST

## 2020-05-31 NOTE — Telephone Encounter (Signed)
Pt called and states that the RX for Trintellix should be for 90 day supply and it was sent in for 30. Please change rx to 90 day if possible and advise pt at [phone].

## 2020-06-04 ENCOUNTER — Ambulatory Visit: Payer: BC Managed Care – PPO | Admitting: Cardiology

## 2020-06-04 ENCOUNTER — Encounter: Payer: Self-pay | Admitting: Cardiology

## 2020-06-04 ENCOUNTER — Telehealth: Payer: Self-pay | Admitting: Neurology

## 2020-06-04 ENCOUNTER — Other Ambulatory Visit: Payer: Self-pay

## 2020-06-04 VITALS — BP 127/72 | HR 70 | Ht 70.0 in | Wt 221.0 lb

## 2020-06-04 DIAGNOSIS — E1165 Type 2 diabetes mellitus with hyperglycemia: Secondary | ICD-10-CM | POA: Diagnosis not present

## 2020-06-04 DIAGNOSIS — E782 Mixed hyperlipidemia: Secondary | ICD-10-CM | POA: Diagnosis not present

## 2020-06-04 DIAGNOSIS — I701 Atherosclerosis of renal artery: Secondary | ICD-10-CM | POA: Diagnosis not present

## 2020-06-04 DIAGNOSIS — N182 Chronic kidney disease, stage 2 (mild): Secondary | ICD-10-CM

## 2020-06-04 DIAGNOSIS — Z794 Long term (current) use of insulin: Secondary | ICD-10-CM

## 2020-06-04 DIAGNOSIS — I2510 Atherosclerotic heart disease of native coronary artery without angina pectoris: Secondary | ICD-10-CM

## 2020-06-04 DIAGNOSIS — E1169 Type 2 diabetes mellitus with other specified complication: Secondary | ICD-10-CM

## 2020-06-04 DIAGNOSIS — E1122 Type 2 diabetes mellitus with diabetic chronic kidney disease: Secondary | ICD-10-CM

## 2020-06-04 DIAGNOSIS — Z87891 Personal history of nicotine dependence: Secondary | ICD-10-CM

## 2020-06-04 MED ORDER — ATORVASTATIN CALCIUM 80 MG PO TABS: 80.0000 mg | ORAL_TABLET | Freq: Every day | ORAL | 0 refills | Status: DC

## 2020-06-04 NOTE — Progress Notes (Signed)
[name] [name] Date of Birth: [birth year] MRN: [id number] Primary Care Provider:Tysinger, Kermit Balo, PA-C Primary Cardiologist: Tessa Lerner, DO, [facility] (established care 02/13/2020)  Date: 06/04/20 Last Office Visit: 04/30/2020  Chief Complaint  Patient presents with  . Chest Pain  . Follow-up    HPI  [name] [name] is a 59 y.o.  male who presents to the office with a chief complaint of " evaluation of chest pain." Patient's past medical history and cardiovascular risk factors include: Insulin-dependent diabetes mellitus type 2, hypertension with chronic kidney disease, mixed hyperlipidemia, family history of premature coronary artery disease, sleep apnea, coronary artery disease.  Patient was originally referred to the office back in February 2021 for evaluation of hypertension, OSA, hyperlipidemia and family history of premature coronary artery disease.  Since then patient has undergone an echocardiogram and nuclear stress test.  The nuclear stress test was reported to be high risk and therefore he was recommended to undergo cardiac CTA with FFR which noted coronary disease most significant in the LAD/diagonal distribution. Goal was to improve his modifiable cardiovascular risk factors.  At last office visit patient noted that he was having chest pain and shortness of breath.  Given the fact that he had recently undergone extensive cardiovascular work-up we focus on uptitrating guideline directed medical therapy.  We will increase his carvedilol at the last office visit and he is tolerated the change well without any side effects or intolerances.  Since last office visit patient states that his chest pain and shortness of breath are resolved.  Patient recently had a renal duplex was noted left renal artery stenosis per duplex.  Reviewed the findings of the renal duplex in great detail with the patient.   Most recent lipid profile reviewed with patient as well.  LDL levels are improving.   Triglyceride level currently not at goal.  ALLERGIES: Allergies  Allergen Reactions  . Pravastatin Other (See Comments)    Joint myalgias  . Trazodone And Nefazodone     Had restless legs  . Hydrochlorothiazide Other (See Comments)    lightheadedness  . Metformin And Related Diarrhea     MEDICATION LIST PRIOR TO VISIT: Current Outpatient Medications on File Prior to Visit  Medication Sig Dispense Refill  . amLODipine (NORVASC) 10 MG tablet Take 10 mg by mouth daily.    Marland Kitchen aspirin EC 81 MG tablet Take 81 mg by mouth daily.    . carvedilol (COREG) 12.5 MG tablet Take 1 tablet (12.5 mg total) by mouth 2 (two) times daily with a meal. Hold if systolic blood pressure (top blood pressure number) less than 100 mmHg or heart rate less than 60 bpm (pulse). 180 tablet 0  . cetirizine (ZYRTEC) 10 MG tablet Take 10 mg by mouth daily.    . empagliflozin (JARDIANCE) 25 MG TABS tablet Take 25 mg by mouth daily. 90 tablet 3  . fluticasone (FLONASE) 50 MCG/ACT nasal spray Place 2 sprays into both nostrils daily.    . hydrALAZINE (APRESOLINE) 25 MG tablet Take 1 tablet (25 mg total) by mouth 3 (three) times daily. 270 tablet 1  . insulin degludec (TRESIBA FLEXTOUCH) 100 UNIT/ML FlexTouch Pen Inject 0.7 mLs (70 Units total) into the skin daily at 10 pm. 3 mL 0  . Insulin Pen Needle (BD PEN NEEDLE NANO U/F) 32G X 4 MM MISC 1 each by Does not apply route at bedtime. 100 each 11  . lisinopril (ZESTRIL) 40 MG tablet Take 1 tablet (40 mg total) by mouth daily. 90  tablet 1  . Needles & Syringes MISC As directed 1ml of testosterone every 14 days 100 each 5  . rOPINIRole (REQUIP) 0.25 MG tablet Take 1 tablet (0.25 mg total) by mouth at bedtime. 30 tablet 5  . tamsulosin (FLOMAX) 0.4 MG CAPS capsule Take 1 capsule (0.4 mg total) by mouth daily. 90 capsule 3  . testosterone cypionate (DEPOTESTOSTERONE CYPIONATE) 200 MG/ML injection Inject 1 mL (200 mg total) into the muscle every 14 (fourteen) days. 30 mL 3  .  vortioxetine HBr (TRINTELLIX) 20 MG TABS tablet Take 1 tablet (20 mg total) by mouth daily. 90 tablet 0  . verapamil (CALAN) 120 MG tablet Take 1 tablet (120 mg total) by mouth once for 1 dose. 90 tablet 1   No current facility-administered medications on file prior to visit.    PAST MEDICAL HISTORY: Past Medical History:  Diagnosis Date  . Anxiety   . Coronary artery disease   . Depression   . Diabetes mellitus    Type 2  . Enlarged prostate   . Environmental allergies   . Environmental and seasonal allergies   . GERD (gastroesophageal reflux disease)   . Headache(784.0)    sinus and migraines  . Hyperlipidemia   . Hypertension   . IBS (irritable bowel syndrome)   . Low testosterone   . Renal artery stenosis (HCC)   . Sleep apnea    has used BiPAP in past; last sleep study >10 years    PAST SURGICAL HISTORY: Past Surgical History:  Procedure Laterality Date  . COLONOSCOPY  09/2018   normal, repeat in 10 years per patient.  [place]  . KNEE ARTHROSCOPY Left   . SACROILIAC JOINT FUSION Right 05/02/2014   Procedure: SACROILIAC JOINT FUSION;  Surgeon: Emilee HeroMark Leonard Dumonski, MD;  [facility];  Service: Orthopedics;  Laterality: Right;  Right sided sacroiliac joint fusion  . TONSILLECTOMY     uvula removed    FAMILY HISTORY: The patient's family history includes Cancer in his brother, father, and mother; Diabetes in his brother; Heart disease in his maternal grandmother, paternal grandfather, and paternal grandmother; Heart disease (age of onset: 952) in his brother; Lung disease in his maternal grandfather; Stroke in his paternal grandmother.   SOCIAL HISTORY:  The patient  reports that he quit smoking about 28 years ago. He has a 10.00 pack-year smoking history. He has never used smokeless tobacco. He reports that he does not drink alcohol or use drugs.  Review of Systems  Constitution: Negative for chills and fever.  HENT: Negative for hoarse voice and nosebleeds.     Eyes: Negative for discharge, double vision and pain.  Cardiovascular: Negative for chest pain, claudication, dyspnea on exertion, leg swelling, near-syncope, orthopnea, palpitations, paroxysmal nocturnal dyspnea and syncope.  Respiratory: Negative for hemoptysis and shortness of breath.   Musculoskeletal: Negative for muscle cramps and myalgias.  Gastrointestinal: Negative for abdominal pain, constipation, diarrhea, hematemesis, hematochezia, melena, nausea and vomiting.  Neurological: Negative for dizziness and light-headedness.    PHYSICAL EXAM: Vitals with BMI 06/04/2020 05/14/2020 05/13/2020  Height 5\' 10"  5\' 10"  5\' 10"   Weight 221 lbs 217 lbs 218 lbs 6 oz  BMI 31.71 31.14 31.34  Systolic 127 131 161102  Diastolic 72 80 60  Pulse 70 73 73   CONSTITUTIONAL: Well-developed and well-nourished. No acute distress.  SKIN: Skin is warm and dry. No rash noted. No cyanosis. No pallor. No jaundice HEAD: Normocephalic and atraumatic.  EYES: No scleral icterus MOUTH/THROAT: Moist oral  membranes.  NECK: No JVD present. No thyromegaly noted. No carotid bruits  LYMPHATIC: No visible cervical adenopathy.  CHEST Normal respiratory effort. No intercostal retractions  LUNGS: Clear to auscultation bilaterally.  No stridor. No wheezes. No rales.  CARDIOVASCULAR: Regular rate and rhythm, positive S1-S2, no murmurs rubs or gallops appreciated. ABDOMINAL: Obese, soft, nontender, nondistended, positive bowel sounds in all 4 quadrants.  No apparent ascites.  EXTREMITIES: No peripheral edema  HEMATOLOGIC: No significant bruising NEUROLOGIC: Oriented to person, place, and time. Nonfocal. Normal muscle tone.  PSYCHIATRIC: Normal mood and affect. Normal behavior. Cooperative  CARDIAC DATABASE: EKG: 02/13/2020: Normal sinus rhythm with a ventricular rate of 76 bpm, normal axis, cannot rule out old anterior infarct, poor R wave progression, no underlying injury pattern. 04/30/2020:sinus rhythm, 67 bpm, normal axis  deviation, poor R wave progression, old anterior infarct, without underlying ischemia or injury pattern.  Echocardiogram:  03/12/2020: LVEF 68%, mild concentric hypertrophy of the LV, grade 1 diastolic impairment, mild MR.  Stress Testing: 03/11/2020: Stress EKG is non-diagnostic, as this is pharmacological  stress test using Lexiscan.  Hypotension noted during stress with lexiscan and modified Bruce protocol exercise, reaching 59% MPHR. Myocardial perfusion imaging is normal. Left ventricular ejection fraction is 64% with normal wall motion. High risk study.  Cardiac CTA with FFR: 03/2020: Coronary Arteries: Right dominant with no anomalies LM: Normal LAD: 50-69% mixed plaque with prominent soft plaque component in proximal vessel 50-69% calcific plaque in mid/distal vessel D1: 25-49% soft plaque in ostial vessel D2: Small vessel 50-69% ostial calcific plaque D3: Small vessel Circumflex: 1-24% soft plaque in mid vessel OM1: Normal AV Groove: Normal RCA: 1-24% calcified ostial stenosis PDA: Normal PLB: Normal  IMPRESSION: Calcium score 95 which is 76 th percentile for age and sex. Normal aortic root 2.9 cm. CAD CAD RADS 3 most significant in LAD and diagonal. Study sent for FFR CT  Fractional flow reserve assessment: RCA 0.99 Circumflex 0.90 LAD 0.94 proximally 0.88 mid vessel 0.84 distally D1: 0.89 IMPRESSION: FFR CT does not suggest hemodynamically significant lesions in LAD/Diagonal see above. Medical Rx and aggressive risk factor modification  Heart C[facility]terization: [facility] at [facility]  Renal Duplex: 05/23/2020:  Right: Normal size right kidney. Normal cortical thickness of right kidney. Cyst(s) noted. No evidence of right renal artery stenosis. RRV flow present. Left: Normal size of left kidney. Normal cortical thickness of the left kidney. Evidence of a > 60% stenosis in the left renal artery. LRV flow present.  LABORATORY DATA: CBC Latest Ref Rng & Units 05/13/2020  01/30/2020 06/06/2019  WBC 3.4 - 10.8 x10E3/uL 9.8 7.9 9.9  Hemoglobin 13.0 - 17.7 g/dL 60.6 18.5(H) 16.2  Hematocrit 37.5 - 51.0 % 51.5(H) 55.4(H) 49.6  Platelets 150 - 450 x10E3/uL 254 240 276    CMP Latest Ref Rng & Units 05/13/2020 04/01/2020 01/30/2020  Glucose 65 - 99 mg/dL - 301(S) 010(X)  BUN 6 - 24 mg/dL - 23 20  Creatinine 3.23 - 1.27 mg/dL - 5.57 3.22  Sodium 025 - 144 mmol/L - 145(H) 145(H)  Potassium 3.5 - 5.2 mmol/L - 4.3 4.6  Chloride 96 - 106 mmol/L - 106 107(H)  CO2 20 - 29 mmol/L - 22 23  Calcium 8.7 - 10.2 mg/dL - 9.8 42.7  Total Protein 6.0 - 8.5 g/dL - - 7.0  Total Bilirubin 0.0 - 1.2 mg/dL - - 0.4  Alkaline Phos 39 - 117 IU/L - - 76  AST 0 - 40 IU/L - - 24  ALT 0 -  44 IU/L 22 - 26    Lipid Panel     Component Value Date/Time   CHOL 128 05/13/2020 0959   TRIG 159 (H) 05/13/2020 0959   HDL 29 (L) 05/13/2020 0959   CHOLHDL 4.4 05/13/2020 0959   CHOLHDL 6.6 Ratio 06/17/2010 2242   VLDL 60 (H) 06/17/2010 2242   LDLCALC 71 05/13/2020 0959   LDLDIRECT 93 08/20/2009 2019   LABVLDL 28 05/13/2020 0959    Lab Results  Component Value Date   HGBA1C 8.9 (H) 05/13/2020   HGBA1C 8.4 (H) 01/30/2020   HGBA1C 8.5 (H) 06/06/2019   No components found for: NTPROBNP Lab Results  Component Value Date   TSH 1.900 04/13/2019   TSH 3.171 01/20/2011   TSH 1.282 04/18/2008    Cardiac Panel (last 3 results) No results for input(s): CKTOTAL, CKMB, TROPONINIHS, RELINDX in the last 72 hours.  FINAL MEDICATION LIST END OF ENCOUNTER: Meds ordered this encounter  Medications  . atorvastatin (LIPITOR) 80 MG tablet    Sig: Take 1 tablet (80 mg total) by mouth at bedtime.    Dispense:  90 tablet    Refill:  0    Medications Discontinued During This Encounter  Medication Reason  . doxepin (SINEQUAN) 10 MG capsule Change in therapy  . atorvastatin (LIPITOR) 40 MG tablet Dose change     Current Outpatient Medications:  .  amLODipine (NORVASC) 10 MG tablet, Take 10 mg by  mouth daily., Disp: , Rfl:  .  aspirin EC 81 MG tablet, Take 81 mg by mouth daily., Disp: , Rfl:  .  carvedilol (COREG) 12.5 MG tablet, Take 1 tablet (12.5 mg total) by mouth 2 (two) times daily with a meal. Hold if systolic blood pressure (top blood pressure number) less than 100 mmHg or heart rate less than 60 bpm (pulse)., Disp: 180 tablet, Rfl: 0 .  cetirizine (ZYRTEC) 10 MG tablet, Take 10 mg by mouth daily., Disp: , Rfl:  .  empagliflozin (JARDIANCE) 25 MG TABS tablet, Take 25 mg by mouth daily., Disp: 90 tablet, Rfl: 3 .  fluticasone (FLONASE) 50 MCG/ACT nasal spray, Place 2 sprays into both nostrils daily., Disp: , Rfl:  .  hydrALAZINE (APRESOLINE) 25 MG tablet, Take 1 tablet (25 mg total) by mouth 3 (three) times daily., Disp: 270 tablet, Rfl: 1 .  insulin degludec (TRESIBA FLEXTOUCH) 100 UNIT/ML FlexTouch Pen, Inject 0.7 mLs (70 Units total) into the skin daily at 10 pm., Disp: 3 mL, Rfl: 0 .  Insulin Pen Needle (BD PEN NEEDLE NANO U/F) 32G X 4 MM MISC, 1 each by Does not apply route at bedtime., Disp: 100 each, Rfl: 11 .  lisinopril (ZESTRIL) 40 MG tablet, Take 1 tablet (40 mg total) by mouth daily., Disp: 90 tablet, Rfl: 1 .  Needles & Syringes MISC, As directed 43ml of testosterone every 14 days, Disp: 100 each, Rfl: 5 .  rOPINIRole (REQUIP) 0.25 MG tablet, Take 1 tablet (0.25 mg total) by mouth at bedtime., Disp: 30 tablet, Rfl: 5 .  tamsulosin (FLOMAX) 0.4 MG CAPS capsule, Take 1 capsule (0.4 mg total) by mouth daily., Disp: 90 capsule, Rfl: 3 .  testosterone cypionate (DEPOTESTOSTERONE CYPIONATE) 200 MG/ML injection, Inject 1 mL (200 mg total) into the muscle every 14 (fourteen) days., Disp: 30 mL, Rfl: 3 .  vortioxetine HBr (TRINTELLIX) 20 MG TABS tablet, Take 1 tablet (20 mg total) by mouth daily., Disp: 90 tablet, Rfl: 0 .  atorvastatin (LIPITOR) 80 MG tablet, Take 1 tablet (80  mg total) by mouth at bedtime., Disp: 90 tablet, Rfl: 0 .  verapamil (CALAN) 120 MG tablet, Take 1 tablet  (120 mg total) by mouth once for 1 dose., Disp: 90 tablet, Rfl: 1  IMPRESSION:    ICD-10-CM   1. Renal artery stenosis (HCC)  I70.1 Ambulatory referral to Cardiology  2. Mixed hyperlipidemia  E78.2 Lipid Panel With LDL/HDL Ratio    Lipid Panel With LDL/HDL Ratio  3. Nonobstructive atherosclerosis of coronary artery  I25.10   4. Type 2 diabetes mellitus with hyperglycemia, with long-term current use of insulin (HCC)  E11.65    Z79.4   5. Type 2 diabetes mellitus with stage 2 chronic kidney disease, with long-term current use of insulin (HCC)  E11.22    N18.2    Z79.4   6. Former smoker  Z87.891   7. Type 2 DM with CKD stage 2 and hypertension (HCC)  E11.22    I12.9    N18.2   8. DM type 2 with diabetic mixed hyperlipidemia [facility])  E11.69    E78.2      RECOMMENDATIONS: [name] [name] is a 58 y.o. male whose past medical history and cardiovascular risk factors include: Insulin-dependent diabetes mellitus type 2, hypertension with chronic kidney disease, mixed hyperlipidemia, family history of premature coronary artery disease, sleep apnea, established coronary artery disease.  Established coronary artery disease with precordial chest pain:  Patient's chest pain has resolved since last office visit.    Medications reconciled.    Continue aspirin  Currently not on Isordil or long-acting nitrates as patient does use erectile dysfunction medications on a as needed basis.  Continue aspirin and statin medications.  Blood pressure is well controlled.  Recommended following up with primary team in regards to better glycemic control.  Most recent lipid profile reviewed.  Continue risk factor modifications.  Hypertension with insulin-dependent diabetes mellitus type 2 and chronic kidney disease stage II:  Systolic blood pressure is currently at  goal.  Medications reconciled.  Low-salt diet recommended.  Reiterated the importance of being evaluated for sleep apnea.  Patient is  willing to have another sleep study done is done in the office as opposed to home study.  I informed patient that I will discuss this further with his primary care provider.  Renal artery stenosis per duplex:  Patient had a renal duplex in May 2021 as noted above.  His left renal artery is suggested to have >60% stenosis.   Discussed the current literature behind renal artery stenosis.    Since his blood pressures are well controlled on current medical regimen would recommend continuing medical therapy at this time as opposed to renal artery stenting which may predispose the patient to possible worsening of kidney function due to microembolization of plaque burden.  Patient would like to be considered for at least renal angiogram.  I will have him see Dr. Jacinto Halim in consultation to discuss this further.  Insulin-dependent diabetes mellitus type 2 with hyperlipidemia:  Uptitrating Lipitor to 80 mg p.o. nightly (06/04/2020) given the renal duplex findings. Will repeat fasting lipids to re-evaluate treatment (07/19/2020). Ordered and released.   Did not endorse any  myalgias.  Dietary recommendations encouraged.  Family history of premature coronary artery disease: See above.  Former smoker: Educated on the importance of continued smoking cessation.   Orders Placed This Encounter  Procedures  . Lipid Panel With LDL/HDL Ratio  . Ambulatory referral to Cardiology   --Continue cardiac medications as reconciled in final medication list. --Return in  about 8 weeks (around 07/30/2020). Or sooner if needed. --Continue follow-up with your primary care physician regarding the management of your other chronic comorbid conditions.  Patient's questions and concerns were addressed to his satisfaction. He voices understanding of the instructions provided during this encounter.   This note was created using a voice recognition software as a result there may be grammatical errors inadvertently enclosed  that do not reflect the nature of this encounter. Every attempt is made to correct such errors.  Tessa Lerner, [place], [facility]  Pager: [phone] Office: [phone]

## 2020-06-04 NOTE — Telephone Encounter (Signed)
Called and LVM for pt letting him know I reviewed his chart but do not see where Dr. Vickey Huger has checked his kidney function recently. Wanted to make sure he meant to call our office or if not, to contact PCP. Asked him to call back if he has further questions.

## 2020-06-04 NOTE — Telephone Encounter (Signed)
Pt called back and I took call from phone staff. He clarified and said he is still waiting on a call about VAS US kidney. I placed on hold and spoke with Dr. Vickey Huger. She advised that he should reach out to PCP about results. I did relay that it looked like his PCP reached out to Dr. Odis Hollingshead on 05/31/20 and may be waiting on response still to determine next steps. He verbalized understanding and will call PCP office.

## 2020-06-04 NOTE — Telephone Encounter (Signed)
Pt is asking for a call with the results to the test ran on his kidneys.  Please call.

## 2020-06-28 ENCOUNTER — Telehealth: Payer: Self-pay | Admitting: Medical

## 2020-06-28 MED ORDER — CETIRIZINE HCL 10 MG PO TABS: 10.0000 mg | ORAL_TABLET | Freq: Every day | ORAL | 2 refills | Status: DC

## 2020-06-28 NOTE — Telephone Encounter (Signed)
Pt called for refills of cetirizine. We have never filled for him but is on his list of meds. Please send to [facility].

## 2020-06-28 NOTE — Telephone Encounter (Signed)
Per vickie. Ok to refill for 3 months

## 2020-07-04 ENCOUNTER — Telehealth: Payer: Self-pay | Admitting: Medical

## 2020-07-04 ENCOUNTER — Encounter: Payer: Self-pay | Admitting: Cardiology

## 2020-07-04 ENCOUNTER — Other Ambulatory Visit: Payer: Self-pay

## 2020-07-04 ENCOUNTER — Ambulatory Visit: Payer: BC Managed Care – PPO | Admitting: Cardiology

## 2020-07-04 VITALS — BP 120/71 | HR 59 | Resp 15 | Ht 70.0 in | Wt 222.4 lb

## 2020-07-04 DIAGNOSIS — E782 Mixed hyperlipidemia: Secondary | ICD-10-CM | POA: Diagnosis not present

## 2020-07-04 DIAGNOSIS — I701 Atherosclerosis of renal artery: Secondary | ICD-10-CM

## 2020-07-04 DIAGNOSIS — I150 Renovascular hypertension: Secondary | ICD-10-CM

## 2020-07-04 DIAGNOSIS — Z794 Long term (current) use of insulin: Secondary | ICD-10-CM

## 2020-07-04 DIAGNOSIS — E1165 Type 2 diabetes mellitus with hyperglycemia: Secondary | ICD-10-CM | POA: Diagnosis not present

## 2020-07-04 NOTE — Telephone Encounter (Signed)
Please work him in for sinus infection visit with someone today or me tomorrow

## 2020-07-04 NOTE — H&P (View-Only) (Signed)
 Primary Physician/Referring:  Tysinger, Cletis S, PA-C  Patient ID: [name] [name], male    DOB: 11/19/1961, 59 y.o.   MRN: [id number]  Chief Complaint  Patient presents with  . Follow-up    1 month  . Renal artery stenosis   HPI:    [name] [name]  is a 59 y.o. DM, hypertension, OSA, hyperlipidemia and family history of premature coronary artery disease.  He also has moderate CAD by cardiac CTA with FFR done on 04/02/2020 and recommended aggressive risk modification.   He is now referred to me by Dr. Tolia for evaluation of renovascular hypertension as he is on multiple medications.  Recent renal duplex had revealed right renal artery stenosis. He is presently asymptomatic.   Past Medical History:  Diagnosis Date  . Anxiety   . Coronary artery disease   . Depression   . Diabetes mellitus    Type 2  . Enlarged prostate   . Environmental allergies   . Environmental and seasonal allergies   . GERD (gastroesophageal reflux disease)   . Headache(784.0)    sinus and migraines  . Hyperlipidemia   . Hypertension   . IBS (irritable bowel syndrome)   . Low testosterone   . Renal artery stenosis (HCC)   . Sleep apnea    has used BiPAP in past; last sleep study >10 years   Past Surgical History:  Procedure Laterality Date  . COLONOSCOPY  09/2018   normal, repeat in 10 years per patient.  [place], [place]  . KNEE ARTHROSCOPY Left   . SACROILIAC JOINT FUSION Right 05/02/2014   Procedure: SACROILIAC JOINT FUSION;  Surgeon: Mark Leonard Dumonski, MD;  Location: [facility] OR;  Service: Orthopedics;  Laterality: Right;  Right sided sacroiliac joint fusion  . TONSILLECTOMY     uvula removed   Family History  Problem Relation Age of Onset  . Cancer Mother        lung  . Cancer Father        prostate, mets to bone, lung  . Cancer Brother        melanoma  . Heart disease Maternal Grandmother        MI  . Lung disease Maternal Grandfather        black lung  . Heart disease Paternal  Grandmother   . Stroke Paternal Grandmother   . Heart disease Paternal Grandfather   . Heart disease Brother 52       MI  . Diabetes Brother     Social History   Tobacco Use  . Smoking status: Former Smoker    Packs/day: 1.00    Years: 10.00    Pack years: 10.00    Quit date: 04/20/1992    Years since quitting: 28.2  . Smokeless tobacco: Never Used  Substance Use Topics  . Alcohol use: No   Marital Status: Single  ROS  Review of Systems  Cardiovascular: Negative for chest pain, dyspnea on exertion and leg swelling.  Gastrointestinal: Negative for melena.   Objective  Blood pressure 120/71, pulse (!) 59, resp. rate 15, height 5' 10" (1.778 m), weight 222 lb 6.4 oz (100.9 kg), SpO2 96 %.  Vitals with BMI 07/05/2020 07/04/2020 06/04/2020  Height - 5' 10" 5' 10"  Weight 223 lbs 3 oz 222 lbs 6 oz 221 lbs  BMI 32.03 31.91 31.71  Systolic 110 120 127  Diastolic 68 71 72  Pulse 67 59 70     Physical Exam Cardiovascular:     Rate   and Rhythm: Normal rate and regular rhythm.     Pulses: Intact distal pulses.     Heart sounds: Normal heart sounds. No murmur heard.  No gallop.      Comments: No leg edema, no JVD. Pulmonary:     Effort: Pulmonary effort is normal.     Breath sounds: Normal breath sounds.  Abdominal:     General: Bowel sounds are normal.     Palpations: Abdomen is soft.    Laboratory examination:   Recent Labs    01/30/20 1223 04/01/20 1307  NA 145* 145*  K 4.6 4.3  CL 107* 106  CO2 23 22  GLUCOSE 109* 164*  BUN 20 23  CREATININE 1.14 1.24  CALCIUM 10.0 9.8  GFRNONAA 71 65  GFRAA 83 75   CrCl cannot be calculated (Patient's most recent lab result is older than the maximum 21 days allowed.).  CMP Latest Ref Rng & Units 05/13/2020 04/01/2020 01/30/2020  Glucose 65 - 99 mg/dL - 144(R) 154(M)  BUN 6 - 24 mg/dL - 23 20  Creatinine 0.86 - 1.27 mg/dL - 7.61 9.50  Sodium 932 - 144 mmol/L - 145(H) 145(H)  Potassium 3.5 - 5.2 mmol/L - 4.3 4.6  Chloride 96 - 106  mmol/L - 106 107(H)  CO2 20 - 29 mmol/L - 22 23  Calcium 8.7 - 10.2 mg/dL - 9.8 67.1  Total Protein 6.0 - 8.5 g/dL - - 7.0  Total Bilirubin 0.0 - 1.2 mg/dL - - 0.4  Alkaline Phos 39 - 117 IU/L - - 76  AST 0 - 40 IU/L - - 24  ALT 0 - 44 IU/L 22 - 26   CBC Latest Ref Rng & Units 05/13/2020 01/30/2020 06/06/2019  WBC 3.4 - 10.8 x10E3/uL 9.8 7.9 9.9  Hemoglobin 13.0 - 17.7 g/dL 24.5 18.5(H) 16.2  Hematocrit 37.5 - 51.0 % 51.5(H) 55.4(H) 49.6  Platelets 150 - 450 x10E3/uL 254 240 276    Lipid Panel Lipid Panel Recent Labs    01/30/20 1223 05/13/20 0959 07/05/20 0922  CHOL 147 128 126  TRIG 180* 159* 100  LDLCALC 87 71 78  HDL 29* 29* 29*  CHOLHDL 5.1* 4.4  --     HEMOGLOBIN A1C Lab Results  Component Value Date   HGBA1C 8.9 (H) 05/13/2020   MPG 389 09/06/2007   TSH No results for input(s): TSH in the last 8760 hours.  Medications and allergies   Allergies  Allergen Reactions  . Pravastatin Other (See Comments)    Joint myalgias  . Trazodone And Nefazodone     Had restless legs  . Hydrochlorothiazide Other (See Comments)    lightheadedness  . Metformin And Related Diarrhea     Current Outpatient Medications  Medication Instructions  . amLODipine (NORVASC) 10 mg, Oral, Daily  . amoxicillin-clavulanate (AUGMENTIN) 875-125 MG tablet 1 tablet, Oral, 2 times daily  . atorvastatin (LIPITOR) 80 mg, Oral, Daily at bedtime  . carvedilol (COREG) 12.5 mg, Oral, 2 times daily with meals, Hold if systolic blood pressure (top blood pressure number) less than 100 mmHg or heart rate less than 60 bpm (pulse).  . cetirizine (ZYRTEC) 10 mg, Oral, Daily  . fluticasone (FLONASE) 50 MCG/ACT nasal spray 2 sprays, Each Nare, Daily  . hydrALAZINE (APRESOLINE) 25 mg, Oral, 3 times daily  . Insulin Pen Needle (BD PEN NEEDLE NANO U/F) 32G X 4 MM MISC 1 each, Does not apply, Daily at bedtime  . Jardiance 25 mg, Oral, Daily  . lisinopril (ZESTRIL)  40 mg, Oral, Daily  . Needles & Syringes MISC  As directed 53ml of testosterone every 14 days  . rOPINIRole (REQUIP) 0.25 mg, Oral, Daily at bedtime  . tamsulosin (FLOMAX) 0.4 mg, Oral, Daily  . testosterone cypionate (DEPOTESTOSTERONE CYPIONATE) 200 mg, Intramuscular, Every 14 days  . Tresiba FlexTouch 70 Units, Subcutaneous, Daily at 10 pm  . verapamil (CALAN) 120 mg, Oral,  Once  . vortioxetine HBr (TRINTELLIX) 20 mg, Oral, Daily    Radiology:   No results found.  Cardiac Studies:   Heart Catheterization: 2008 at [facility]  Echocardiogram:  03/12/2020: LVEF 68%, mild concentric hypertrophy of the LV, grade 1 diastolic impairment, mild MR.  Stress Testing: 03/11/2020: Stress EKG is non-diagnostic, as this is pharmacological  stress test using Lexiscan.  Hypotension noted during stress with lexiscan and modified Bruce protocol exercise, reaching 59% MPHR. Myocardial perfusion imaging is normal. Left ventricular ejection fraction is 64% with normal wall motion. High risk study.  Cardiac CTA with FFR 04/02/2020: Coronary Arteries: Right dominant with no anomalies LM: Normal LAD: 50-69% mixed plaque with prominent soft plaque component in proximal vessel 50-69% calcific plaque in mid/distal vessel D1: 25-49% soft plaque in ostial vessel D2: Small vessel 50-69% ostial calcific plaque D3: Small vessel Circumflex: 1-24% soft plaque in mid vessel OM1: Normal AV Groove: Normal RCA: 1-24% calcified ostial stenosis PDA: Normal PLB: Normal  IMPRESSION: Calcium score 95 which is 76 th percentile for age and sex. Normal aortic root 2.9 cm. CAD CAD RADS 3 most significant in LAD and diagonal. Study sent for FFR CT  Fractional flow reserve assessment: RCA 0.99 Circumflex 0.90 LAD 0.94 proximally 0.88 mid vessel 0.84 distally D1: 0.89 IMPRESSION: FFR CT does not suggest hemodynamically significant lesions in LAD/Diagonal see above. Medical Rx and aggressive risk factor modification  Renal Duplex: 05/23/2020:  Right: Normal size  right kidney. Normal cortical thickness of right kidney. Cyst(s) noted. No evidence of right renal artery stenosis. RRV flow present. Left: Normal size of left kidney. Normal cortical thickness of the left kidney. Evidence of a > 60% stenosis in the left renal artery. LRV flow present.  EKG  02/13/2020: Normal sinus rhythm with a ventricular rate of 76 bpm, normal axis, cannot rule out old anterior infarct, poor R wave progression, no underlying injury pattern. 04/30/2020:sinus rhythm, 67 bpm, normal axis deviation, poor R wave progression, old anterior infarct, without underlying ischemia or injury pattern.    Assessment     ICD-10-CM   1. 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 Renal artery stenosis (HCC)  I70.1   3. Mixed hyperlipidemia  E78.2   4. Type 2 diabetes mellitus with hyperglycemia, with long-term current use of insulin (HCC)  E11.65    Z79.4     There are no discontinued medications.   Recommendations:   Peder Allums  is a 59 y.o. with DM, hypertension, OSA, hyperlipidemia and family history of premature coronary artery disease.  He also has moderate CAD by cardiac CTA with FFR done on 04/02/2020 and recommended aggressive risk modification.   He is now referred to me by Dr. Odis Hollingshead for evaluation of renovascular hypertension as he is on multiple medications.  Recent renal duplex had revealed right renal artery stenosis.  I have reviewed the results of the recent renal artery duplex, advised him that in view of blood pressure being well controlled although he is on multiple medications, we should continue medical therapy only for now with continued  risk factor modification.  However patient states that he would like to proceed with renal arteriogram as he feels that he may be able to come off of some of the medications.  I have discussed with him regarding the risks associated with renal arteriogram including but not limited to renal tissue loss,  embolic complications, bleeding, infection but not limited to this.  I did not make any changes to his medications for now.  He will hold diuretics and lisinopril prior to angiography.  I also had a very long discussion with the patient regarding management of risk factors, especially regarding uncontrolled diabetes mellitus as well.  This was a 40-minute office visit encounter with review of his medical records, discussions regarding high risk procedure and medical management of risk factors.   Yates Decamp, MD, [facility] 07/07/2020, 11:19 AM Office: [phone]

## 2020-07-04 NOTE — Patient Instructions (Signed)
Hold Amlodipine and lisinopril one day prior to procedure and day of the procedure. Otherwise take all medications

## 2020-07-04 NOTE — Progress Notes (Signed)
Primary Physician/Referring:  Jac Canavanysinger, Austen S, PA-C  Patient ID: [name] [name], male    DOB: 11/11/1961, 59 y.o.   MRN: [id number]  Chief Complaint  Patient presents with  . Follow-up    1 month  . Renal artery stenosis   HPI:    [name] PiesDavid Mcdade  is a 59 y.o. DM, hypertension, OSA, hyperlipidemia and family history of premature coronary artery disease.  He also has moderate CAD by cardiac CTA with FFR done on 04/02/2020 and recommended aggressive risk modification.   He is now referred to me by Dr. Odis Hollingsheadolia for evaluation of renovascular hypertension as he is on multiple medications.  Recent renal duplex had revealed right renal artery stenosis. He is presently asymptomatic.   Past Medical History:  Diagnosis Date  . Anxiety   . Coronary artery disease   . Depression   . Diabetes mellitus    Type 2  . Enlarged prostate   . Environmental allergies   . Environmental and seasonal allergies   . GERD (gastroesophageal reflux disease)   . Headache(784.0)    sinus and migraines  . Hyperlipidemia   . Hypertension   . IBS (irritable bowel syndrome)   . Low testosterone   . Renal artery stenosis (HCC)   . Sleep apnea    has used BiPAP in past; last sleep study >10 years   Past Surgical History:  Procedure Laterality Date  . COLONOSCOPY  09/2018   normal, repeat in 10 years per patient.  [place]  . KNEE ARTHROSCOPY Left   . SACROILIAC JOINT FUSION Right 05/02/2014   Procedure: SACROILIAC JOINT FUSION;  Surgeon: Emilee HeroMark Leonard Dumonski, MD;  [facility];  Service: Orthopedics;  Laterality: Right;  Right sided sacroiliac joint fusion  . TONSILLECTOMY     uvula removed   Family History  Problem Relation Age of Onset  . Cancer Mother        lung  . Cancer Father        prostate, mets to bone, lung  . Cancer Brother        melanoma  . Heart disease Maternal Grandmother        MI  . Lung disease Maternal Grandfather        black lung  . Heart disease Paternal  Grandmother   . Stroke Paternal Grandmother   . Heart disease Paternal Grandfather   . Heart disease Brother [id number]       MI  . Diabetes Brother     Social History   Tobacco Use  . Smoking status: Former Smoker    Packs/day: 1.00    Years: 10.00    Pack years: 10.00    Quit date: 04/20/1992    Years since quitting: 28.2  . Smokeless tobacco: Never Used  Substance Use Topics  . Alcohol use: No   Marital Status: Single  ROS  Review of Systems  Cardiovascular: Negative for chest pain, dyspnea on exertion and leg swelling.  Gastrointestinal: Negative for melena.   Objective  Blood pressure 120/71, pulse (!) 59, resp. rate 15, height 5\' 10"  (1.778 m), weight 222 lb 6.4 oz (100.9 kg), SpO2 96 %.  Vitals with BMI 07/05/2020 07/04/2020 06/04/2020  Height - 5\' 10"  5\' 10"   Weight 223 lbs 3 oz 222 lbs 6 oz 221 lbs  BMI 32.03 31.91 31.71  Systolic 110 120 119127  Diastolic 68 71 72  Pulse 67 59 70     Physical Exam Cardiovascular:     Rate  and Rhythm: Normal rate and regular rhythm.     Pulses: Intact distal pulses.     Heart sounds: Normal heart sounds. No murmur heard.  No gallop.      Comments: No leg edema, no JVD. Pulmonary:     Effort: Pulmonary effort is normal.     Breath sounds: Normal breath sounds.  Abdominal:     General: Bowel sounds are normal.     Palpations: Abdomen is soft.    Laboratory examination:   Recent Labs    01/30/20 1223 04/01/20 1307  NA 145* 145*  K 4.6 4.3  CL 107* 106  CO2 23 22  GLUCOSE 109* 164*  BUN 20 23  CREATININE 1.14 1.24  CALCIUM 10.0 9.8  GFRNONAA 71 65  GFRAA 83 75   CrCl cannot be calculated (Patient's most recent lab result is older than the maximum 21 days allowed.).  CMP Latest Ref Rng & Units 05/13/2020 04/01/2020 01/30/2020  Glucose 65 - 99 mg/dL - 144(R) 154(M)  BUN 6 - 24 mg/dL - 23 20  Creatinine 0.86 - 1.27 mg/dL - 7.61 9.50  Sodium 932 - 144 mmol/L - 145(H) 145(H)  Potassium 3.5 - 5.2 mmol/L - 4.3 4.6  Chloride 96 - 106  mmol/L - 106 107(H)  CO2 20 - 29 mmol/L - 22 23  Calcium 8.7 - 10.2 mg/dL - 9.8 67.1  Total Protein 6.0 - 8.5 g/dL - - 7.0  Total Bilirubin 0.0 - 1.2 mg/dL - - 0.4  Alkaline Phos 39 - 117 IU/L - - 76  AST 0 - 40 IU/L - - 24  ALT 0 - 44 IU/L 22 - 26   CBC Latest Ref Rng & Units 05/13/2020 01/30/2020 06/06/2019  WBC 3.4 - 10.8 x10E3/uL 9.8 7.9 9.9  Hemoglobin 13.0 - 17.7 g/dL 24.5 18.5(H) 16.2  Hematocrit 37.5 - 51.0 % 51.5(H) 55.4(H) 49.6  Platelets 150 - 450 x10E3/uL 254 240 276    Lipid Panel Lipid Panel Recent Labs    01/30/20 1223 05/13/20 0959 07/05/20 0922  CHOL 147 128 126  TRIG 180* 159* 100  LDLCALC 87 71 78  HDL 29* 29* 29*  CHOLHDL 5.1* 4.4  --     HEMOGLOBIN A1C Lab Results  Component Value Date   HGBA1C 8.9 (H) 05/13/2020   MPG 389 09/06/2007   TSH No results for input(s): TSH in the last 8760 hours.  Medications and allergies   Allergies  Allergen Reactions  . Pravastatin Other (See Comments)    Joint myalgias  . Trazodone And Nefazodone     Had restless legs  . Hydrochlorothiazide Other (See Comments)    lightheadedness  . Metformin And Related Diarrhea     Current Outpatient Medications  Medication Instructions  . amLODipine (NORVASC) 10 mg, Oral, Daily  . amoxicillin-clavulanate (AUGMENTIN) 875-125 MG tablet 1 tablet, Oral, 2 times daily  . atorvastatin (LIPITOR) 80 mg, Oral, Daily at bedtime  . carvedilol (COREG) 12.5 mg, Oral, 2 times daily with meals, Hold if systolic blood pressure (top blood pressure number) less than 100 mmHg or heart rate less than 60 bpm (pulse).  . cetirizine (ZYRTEC) 10 mg, Oral, Daily  . fluticasone (FLONASE) 50 MCG/ACT nasal spray 2 sprays, Each Nare, Daily  . hydrALAZINE (APRESOLINE) 25 mg, Oral, 3 times daily  . Insulin Pen Needle (BD PEN NEEDLE NANO U/F) 32G X 4 MM MISC 1 each, Does not apply, Daily at bedtime  . Jardiance 25 mg, Oral, Daily  . lisinopril (ZESTRIL)  40 mg, Oral, Daily  . Needles & Syringes MISC  As directed 53ml of testosterone every 14 days  . rOPINIRole (REQUIP) 0.25 mg, Oral, Daily at bedtime  . tamsulosin (FLOMAX) 0.4 mg, Oral, Daily  . testosterone cypionate (DEPOTESTOSTERONE CYPIONATE) 200 mg, Intramuscular, Every 14 days  . Tresiba FlexTouch 70 Units, Subcutaneous, Daily at 10 pm  . verapamil (CALAN) 120 mg, Oral,  Once  . vortioxetine HBr (TRINTELLIX) 20 mg, Oral, Daily    Radiology:   No results found.  Cardiac Studies:   Heart Catheterization: 2008 at [facility]  Echocardiogram:  03/12/2020: LVEF 68%, mild concentric hypertrophy of the LV, grade 1 diastolic impairment, mild MR.  Stress Testing: 03/11/2020: Stress EKG is non-diagnostic, as this is pharmacological  stress test using Lexiscan.  Hypotension noted during stress with lexiscan and modified Bruce protocol exercise, reaching 59% MPHR. Myocardial perfusion imaging is normal. Left ventricular ejection fraction is 64% with normal wall motion. High risk study.  Cardiac CTA with FFR 04/02/2020: Coronary Arteries: Right dominant with no anomalies LM: Normal LAD: 50-69% mixed plaque with prominent soft plaque component in proximal vessel 50-69% calcific plaque in mid/distal vessel D1: 25-49% soft plaque in ostial vessel D2: Small vessel 50-69% ostial calcific plaque D3: Small vessel Circumflex: 1-24% soft plaque in mid vessel OM1: Normal AV Groove: Normal RCA: 1-24% calcified ostial stenosis PDA: Normal PLB: Normal  IMPRESSION: Calcium score 95 which is 76 th percentile for age and sex. Normal aortic root 2.9 cm. CAD CAD RADS 3 most significant in LAD and diagonal. Study sent for FFR CT  Fractional flow reserve assessment: RCA 0.99 Circumflex 0.90 LAD 0.94 proximally 0.88 mid vessel 0.84 distally D1: 0.89 IMPRESSION: FFR CT does not suggest hemodynamically significant lesions in LAD/Diagonal see above. Medical Rx and aggressive risk factor modification  Renal Duplex: 05/23/2020:  Right: Normal size  right kidney. Normal cortical thickness of right kidney. Cyst(s) noted. No evidence of right renal artery stenosis. RRV flow present. Left: Normal size of left kidney. Normal cortical thickness of the left kidney. Evidence of a > 60% stenosis in the left renal artery. LRV flow present.  EKG  02/13/2020: Normal sinus rhythm with a ventricular rate of 76 bpm, normal axis, cannot rule out old anterior infarct, poor R wave progression, no underlying injury pattern. 04/30/2020:sinus rhythm, 67 bpm, normal axis deviation, poor R wave progression, old anterior infarct, without underlying ischemia or injury pattern.    Assessment     ICD-10-CM   1. 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 Renal artery stenosis (HCC)  I70.1   3. Mixed hyperlipidemia  E78.2   4. Type 2 diabetes mellitus with hyperglycemia, with long-term current use of insulin (HCC)  E11.65    Z79.4     There are no discontinued medications.   Recommendations:   Peder Allums  is a 59 y.o. with DM, hypertension, OSA, hyperlipidemia and family history of premature coronary artery disease.  He also has moderate CAD by cardiac CTA with FFR done on 04/02/2020 and recommended aggressive risk modification.   He is now referred to me by Dr. Odis Hollingshead for evaluation of renovascular hypertension as he is on multiple medications.  Recent renal duplex had revealed right renal artery stenosis.  I have reviewed the results of the recent renal artery duplex, advised him that in view of blood pressure being well controlled although he is on multiple medications, we should continue medical therapy only for now with continued  risk factor modification.  However patient states that he would like to proceed with renal arteriogram as he feels that he may be able to come off of some of the medications.  I have discussed with him regarding the risks associated with renal arteriogram including but not limited to renal tissue loss,  embolic complications, bleeding, infection but not limited to this.  I did not make any changes to his medications for now.  He will hold diuretics and lisinopril prior to angiography.  I also had a very long discussion with the patient regarding management of risk factors, especially regarding uncontrolled diabetes mellitus as well.  This was a 40-minute office visit encounter with review of his medical records, discussions regarding high risk procedure and medical management of risk factors.   Yates Decamp, MD, [facility] 07/07/2020, 11:19 AM Office: [phone]

## 2020-07-05 ENCOUNTER — Ambulatory Visit: Payer: BC Managed Care – PPO | Admitting: Family Medicine

## 2020-07-05 ENCOUNTER — Encounter: Payer: Self-pay | Admitting: Family Medicine

## 2020-07-05 VITALS — BP 110/68 | HR 67 | Temp 98.5°F | Wt 223.2 lb

## 2020-07-05 DIAGNOSIS — E782 Mixed hyperlipidemia: Secondary | ICD-10-CM | POA: Diagnosis not present

## 2020-07-05 DIAGNOSIS — J0100 Acute maxillary sinusitis, unspecified: Secondary | ICD-10-CM | POA: Diagnosis not present

## 2020-07-05 MED ORDER — AMOXICILLIN-POT CLAVULANATE 875-125 MG PO TABS: 1.0000 | ORAL_TABLET | Freq: Two times a day (BID) | ORAL | 0 refills | Status: DC

## 2020-07-05 NOTE — Telephone Encounter (Signed)
Called pt this morning to get him on the schedule with Dr. Susann Givens. No answer so I left him a VM to call the office and schedule.

## 2020-07-05 NOTE — Telephone Encounter (Signed)
FYI In person he normally is fine, I have heard complaints from the front office before that he can be difficult on the phone.  This was a work in at the request of Dr. Jacinto Halim yesterday.  If you find him to be less than polite today, we may need to talk to him about finding another provider

## 2020-07-05 NOTE — Telephone Encounter (Signed)
Pt called back and was very rude. He was offered a 2:00 appt with Dr.Lalonde and his exact words were "he better be on time because I have to be to work at 3". Pt was offered a virtual appt instead and said no im coming in person because yall are gonna take my money anyway.

## 2020-07-05 NOTE — Progress Notes (Signed)
   Subjective:    Patient ID: [name] [name], male    DOB: 07-15-1961, 59 y.o.   MRN: [id number]  HPI He complains of a 1 week history of sore throat, postnasal drainage, right maxillary sinus pressure as well as right eye discomfort.  He also states his right ear is causing some difficulty.  He has underlying allergies and is treating them appropriately.  He does not smoke.  No fever or chills.   Review of Systems     Objective:   Physical Exam Alert and in no distress. Tympanic membranes and canals are normal. Pharyngeal area is normal. Neck is supple without adenopathy or thyromegaly. Cardiac exam shows a regular sinus rhythm without murmurs or gallops. Lungs are clear to auscultation. Nasal mucosa is red bilaterally.  Tender over right maxillary sinus.       Assessment & Plan:  Acute non-recurrent maxillary sinusitis - Plan: amoxicillin-clavulanate (AUGMENTIN) 875-125 MG tablet He is to call if not entirely better when he finishes the antibiotic.

## 2020-07-06 LAB — LIPID PANEL WITH LDL/HDL RATIO
Cholesterol, Total: 126 mg/dL (ref 100–199)
HDL: 29 mg/dL — ABNORMAL LOW (ref >39)
LDL Chol Calc (NIH): 78 mg/dL (ref 0–99)
LDL/HDL Ratio: 2.7 ratio (ref 0.0–3.6)
Triglycerides: 100 mg/dL (ref 0–149)
VLDL Cholesterol Cal: 19 mg/dL (ref 5–40)

## 2020-07-15 ENCOUNTER — Telehealth: Payer: Self-pay | Admitting: Internal Medicine

## 2020-07-15 DIAGNOSIS — J0100 Acute maxillary sinusitis, unspecified: Secondary | ICD-10-CM

## 2020-07-15 MED ORDER — AMOXICILLIN-POT CLAVULANATE 875-125 MG PO TABS: 1.0000 | ORAL_TABLET | Freq: Two times a day (BID) | ORAL | 0 refills | Status: DC

## 2020-07-15 NOTE — Telephone Encounter (Signed)
Pt called and states that he was seen a couple weeks ago for sinuses he finished antibitoic but he is still having jaw pain and some sinus pressure. Please advise. He has never experience jaw pain before

## 2020-07-15 NOTE — Telephone Encounter (Signed)
Lets do another 10 days of the antibiotic and if still having difficulty, have him make an appointment.

## 2020-07-16 ENCOUNTER — Ambulatory Visit (HOSPITAL_COMMUNITY)
Admission: RE | Admit: 2020-07-16 | Discharge: 2020-07-16 | Disposition: A | Discharge status: Home / Self Care | Payer: BC Managed Care – PPO | Attending: Cardiology | Admitting: Cardiology

## 2020-07-16 ENCOUNTER — Other Ambulatory Visit: Payer: Self-pay

## 2020-07-16 ENCOUNTER — Encounter (HOSPITAL_COMMUNITY)
Admission: RE | Disposition: A | Discharge status: Home / Self Care | Payer: Self-pay | Source: Home / Self Care | Attending: Cardiology

## 2020-07-16 DIAGNOSIS — E1165 Type 2 diabetes mellitus with hyperglycemia: Secondary | ICD-10-CM | POA: Diagnosis not present

## 2020-07-16 DIAGNOSIS — K219 Gastro-esophageal reflux disease without esophagitis: Secondary | ICD-10-CM | POA: Insufficient documentation

## 2020-07-16 DIAGNOSIS — I1A0 Resistant hypertension: Secondary | ICD-10-CM | POA: Diagnosis present

## 2020-07-16 DIAGNOSIS — I2510 Atherosclerotic heart disease of native coronary artery without angina pectoris: Secondary | ICD-10-CM | POA: Diagnosis not present

## 2020-07-16 DIAGNOSIS — E782 Mixed hyperlipidemia: Secondary | ICD-10-CM | POA: Insufficient documentation

## 2020-07-16 DIAGNOSIS — F329 Major depressive disorder, single episode, unspecified: Secondary | ICD-10-CM | POA: Diagnosis not present

## 2020-07-16 DIAGNOSIS — I150 Renovascular hypertension: Secondary | ICD-10-CM | POA: Diagnosis not present

## 2020-07-16 DIAGNOSIS — Z9889 Other specified postprocedural states: Secondary | ICD-10-CM | POA: Diagnosis not present

## 2020-07-16 DIAGNOSIS — E1159 Type 2 diabetes mellitus with other circulatory complications: Secondary | ICD-10-CM | POA: Diagnosis present

## 2020-07-16 DIAGNOSIS — I10 Essential (primary) hypertension: Secondary | ICD-10-CM | POA: Diagnosis present

## 2020-07-16 DIAGNOSIS — Z87891 Personal history of nicotine dependence: Secondary | ICD-10-CM | POA: Diagnosis not present

## 2020-07-16 DIAGNOSIS — Z794 Long term (current) use of insulin: Secondary | ICD-10-CM | POA: Diagnosis not present

## 2020-07-16 DIAGNOSIS — G4733 Obstructive sleep apnea (adult) (pediatric): Secondary | ICD-10-CM | POA: Insufficient documentation

## 2020-07-16 DIAGNOSIS — Z79899 Other long term (current) drug therapy: Secondary | ICD-10-CM | POA: Diagnosis not present

## 2020-07-16 DIAGNOSIS — F419 Anxiety disorder, unspecified: Secondary | ICD-10-CM | POA: Insufficient documentation

## 2020-07-16 HISTORY — PX: RENAL ANGIOGRAPHY: CATH118260

## 2020-07-16 LAB — BASIC METABOLIC PANEL
Anion gap: 9 (ref 5–15)
BUN: 22 mg/dL — ABNORMAL HIGH (ref 6–20)
CO2: 26 mmol/L (ref 22–32)
Calcium: 9.7 mg/dL (ref 8.9–10.3)
Chloride: 107 mmol/L (ref 98–111)
Creatinine, Ser: 1.06 mg/dL (ref 0.61–1.24)
GFR calc Af Amer: >60 mL/min (ref >60)
GFR calc non Af Amer: >60 mL/min (ref >60)
Glucose, Bld: 128 mg/dL — ABNORMAL HIGH (ref 70–99)
Potassium: 5.1 mmol/L (ref 3.5–5.1)
Sodium: 142 mmol/L (ref 135–145)

## 2020-07-16 LAB — CBC
HCT: 54.6 % — ABNORMAL HIGH (ref 39.0–52.0)
Hemoglobin: 18 g/dL — ABNORMAL HIGH (ref 13.0–17.0)
MCH: 30.3 pg (ref 26.0–34.0)
MCHC: 33 g/dL (ref 30.0–36.0)
MCV: 91.9 fL (ref 80.0–100.0)
Platelets: 230 10*3/uL (ref 150–400)
RBC: 5.94 MIL/uL — ABNORMAL HIGH (ref 4.22–5.81)
RDW: 12.7 % (ref 11.5–15.5)
WBC: 8.6 10*3/uL (ref 4.0–10.5)
nRBC: 0 % (ref 0.0–0.2)

## 2020-07-16 LAB — GLUCOSE, CAPILLARY
Glucose-Capillary: 127 mg/dL — ABNORMAL HIGH (ref 70–99)
Glucose-Capillary: 78 mg/dL (ref 70–99)

## 2020-07-16 SURGERY — RENAL ANGIOGRAPHY
Anesthesia: LOCAL

## 2020-07-16 MED ORDER — SODIUM CHLORIDE 0.9% FLUSH: 3.0000 mL | Freq: Two times a day (BID) | INTRAVENOUS | Status: DC

## 2020-07-16 MED ORDER — FENTANYL CITRATE (PF) 100 MCG/2ML IJ SOLN
INTRAMUSCULAR | Status: AC
  Filled 2020-07-16: qty 2

## 2020-07-16 MED ORDER — HEPARIN (PORCINE) IN NACL 1000-0.9 UT/500ML-% IV SOLN
INTRAVENOUS | Status: AC
  Filled 2020-07-16: qty 1000

## 2020-07-16 MED ORDER — MIDAZOLAM HCL 2 MG/2ML IJ SOLN
INTRAMUSCULAR | Status: DC | PRN
  Administered 2020-07-16: 1 mg via INTRAVENOUS

## 2020-07-16 MED ORDER — FENTANYL CITRATE (PF) 100 MCG/2ML IJ SOLN
INTRAMUSCULAR | Status: DC | PRN
  Administered 2020-07-16: 50 ug via INTRAVENOUS

## 2020-07-16 MED ORDER — LIDOCAINE HCL (PF) 1 % IJ SOLN
INTRAMUSCULAR | Status: AC
  Filled 2020-07-16: qty 30

## 2020-07-16 MED ORDER — SODIUM CHLORIDE 0.9 % IV SOLN: INTRAVENOUS | Status: DC

## 2020-07-16 MED ORDER — VERAPAMIL HCL 2.5 MG/ML IV SOLN
INTRAVENOUS | Status: DC | PRN
  Administered 2020-07-16: 5 mL via INTRA_ARTERIAL

## 2020-07-16 MED ORDER — SODIUM CHLORIDE 0.9 % IV SOLN: 250.0000 mL | INTRAVENOUS | Status: DC | PRN

## 2020-07-16 MED ORDER — IODIXANOL 320 MG/ML IV SOLN
INTRAVENOUS | Status: DC | PRN
  Administered 2020-07-16: 10 mL via INTRA_ARTERIAL

## 2020-07-16 MED ORDER — LIDOCAINE HCL (PF) 1 % IJ SOLN
INTRAMUSCULAR | Status: DC | PRN
  Administered 2020-07-16: 2 mL via INTRADERMAL

## 2020-07-16 MED ORDER — VERAPAMIL HCL 2.5 MG/ML IV SOLN
INTRAVENOUS | Status: AC
  Filled 2020-07-16: qty 2

## 2020-07-16 MED ORDER — MIDAZOLAM HCL 2 MG/2ML IJ SOLN
INTRAMUSCULAR | Status: AC
  Filled 2020-07-16: qty 2

## 2020-07-16 MED ORDER — SODIUM CHLORIDE 0.9% FLUSH: 3.0000 mL | INTRAVENOUS | Status: DC | PRN

## 2020-07-16 MED ORDER — SODIUM CHLORIDE 0.9 % IV BOLUS
500.0000 mL | Freq: Once | INTRAVENOUS | Status: AC
  Administered 2020-07-16: 500 mL via INTRAVENOUS

## 2020-07-16 MED ORDER — HEPARIN (PORCINE) IN NACL 1000-0.9 UT/500ML-% IV SOLN
INTRAVENOUS | Status: DC | PRN
  Administered 2020-07-16 (×2): 500 mL

## 2020-07-16 MED ORDER — HEPARIN SODIUM (PORCINE) 1000 UNIT/ML IJ SOLN
INTRAMUSCULAR | Status: DC | PRN
  Administered 2020-07-16: 5000 [IU] via INTRAVENOUS

## 2020-07-16 MED ORDER — HEPARIN SODIUM (PORCINE) 1000 UNIT/ML IJ SOLN
INTRAMUSCULAR | Status: AC
  Filled 2020-07-16: qty 1

## 2020-07-16 SURGICAL SUPPLY — 10 items
BAG SNAP BAND KOVER 36X36 (MISCELLANEOUS) ×1 IMPLANT
CATH INFINITI JR4 5F (CATHETERS) ×1 IMPLANT
COVER DOME SNAP 22 D (MISCELLANEOUS) ×1 IMPLANT
DEVICE RAD COMP TR BAND LRG (VASCULAR PRODUCTS) ×1 IMPLANT
GLIDESHEATH SLEND A-KIT 6F 22G (SHEATH) ×1 IMPLANT
KIT HEART LEFT (KITS) ×1 IMPLANT
SHEATH PROBE COVER 6X72 (BAG) ×1 IMPLANT
TRANSDUCER W/STOPCOCK (MISCELLANEOUS) ×2 IMPLANT
TRAY PV CATH (CUSTOM PROCEDURE TRAY) ×2 IMPLANT
WIRE HI TORQ VERSACORE-J 145CM (WIRE) ×1 IMPLANT

## 2020-07-16 NOTE — Progress Notes (Signed)
Patient verbalized that he has a neighbor that will take him to his house and a friend will call him thru the night to check on him.  Reinforced the importance of having someone to check on him and even be with him due to danger of artery bleeding.  Patient stated he understands and will be "okay".

## 2020-07-16 NOTE — Interval H&P Note (Signed)
History and Physical Interval Note:  07/16/2020 1:10 PM  [name] [name]  has presented today for surgery, with the diagnosis of CAD.  The various methods of treatment have been discussed with the patient and family. After consideration of risks, benefits and other options for treatment, the patient has consented to  Procedure(s): RENAL ANGIOGRAPHY (N/A) and possible angioplasty as a surgical intervention.  The patient's history has been reviewed, patient examined, no change in status, stable for surgery.  I have reviewed the patient's chart and labs.  Questions were answered to the patient's satisfaction.     Yates Decamp

## 2020-07-16 NOTE — Discharge Instructions (Signed)
DRINK PLENTY OF FLUIDS OVER THE NEXT 2-3 DAYS. Radial Site Care  This sheet gives you information about how to care for yourself after your procedure. Your health care provider may also give you more specific instructions. If you have problems or questions, contact your health care provider. What can I expect after the procedure? After the procedure, it is common to have:  Bruising and tenderness at the catheter insertion area. Follow these instructions at home: Medicines  Take over-the-counter and prescription medicines only as told by your health care provider. Insertion site care  Follow instructions from your health care provider about how to take care of your insertion site. Make sure you: ? Wash your hands with soap and water before you change your bandage (dressing). If soap and water are not available, use hand sanitizer. ? Change your dressing as told by your health care provider. ? Leave stitches (sutures), skin glue, or adhesive strips in place. These skin closures may need to stay in place for 2 weeks or longer. If adhesive strip edges start to loosen and curl up, you may trim the loose edges. Do not remove adhesive strips completely unless your health care provider tells you to do that.  Check your insertion site every day for signs of infection. Check for: ? Redness, swelling, or pain. ? Fluid or blood. ? Pus or a bad smell. ? Warmth.  Do not take baths, swim, or use a hot tub until your health care provider approves.  You may shower 24-48 hours after the procedure, or as directed by your health care provider. ? Remove the dressing and gently wash the site with plain soap and water. ? Pat the area dry with a clean towel. ? Do not rub the site. That could cause bleeding.  Do not apply powder or lotion to the site. Activity   For 24 hours after the procedure, or as directed by your health care provider: ? Do not flex or bend the affected arm. ? Do not push or pull  heavy objects with the affected arm. ? Do not drive yourself home from the hospital or clinic. You may drive 24 hours after the procedure unless your health care provider tells you not to. ? Do not operate machinery or power tools.  Do not lift anything that is heavier than 10 lb (4.5 kg), or the limit that you are told, until your health care provider says that it is safe.  Ask your health care provider when it is okay to: ? Return to work or school. ? Resume usual physical activities or sports. ? Resume sexual activity. General instructions  If the catheter site starts to bleed, raise your arm and put firm pressure on the site. If the bleeding does not stop, get help right away. This is a medical emergency.  If you went home on the same day as your procedure, a responsible adult should be with you for the first 24 hours after you arrive home.  Keep all follow-up visits as told by your health care provider. This is important. Contact a health care provider if:  You have a fever.  You have redness, swelling, or yellow drainage around your insertion site. Get help right away if:  You have unusual pain at the radial site.  The catheter insertion area swells very fast.  The insertion area is bleeding, and the bleeding does not stop when you hold steady pressure on the area.  Your arm or hand becomes pale, cool, tingly,   or numb. These symptoms may represent a serious problem that is an emergency. Do not wait to see if the symptoms will go away. Get medical help right away. Call your local emergency services (911 in the [place] Do not drive yourself to the hospital. Summary  After the procedure, it is common to have bruising and tenderness at the site.  Follow instructions from your health care provider about how to take care of your radial site wound. Check the wound every day for signs of infection.  Do not lift anything that is heavier than 10 lb (4.5 kg), or the limit that you are  told, until your health care provider says that it is safe. This information is not intended to replace advice given to you by your health care provider. Make sure you discuss any questions you have with your health care provider. Document Revised: 01/19/2018 Document Reviewed: 01/19/2018 Elsevier Patient Education  2020 [employer]  

## 2020-07-16 NOTE — Progress Notes (Signed)
Patient refused to ride in wheelchair.  shortstay staff saw patient and his neighbor walk to parking deck to get patient's car.

## 2020-07-16 NOTE — Telephone Encounter (Signed)
Pt was notified.  

## 2020-07-17 ENCOUNTER — Encounter (HOSPITAL_COMMUNITY): Payer: Self-pay | Admitting: Cardiology

## 2020-07-18 ENCOUNTER — Other Ambulatory Visit: Payer: Self-pay | Admitting: Cardiology

## 2020-07-25 ENCOUNTER — Other Ambulatory Visit: Payer: Self-pay | Admitting: Cardiology

## 2020-07-25 DIAGNOSIS — E1122 Type 2 diabetes mellitus with diabetic chronic kidney disease: Secondary | ICD-10-CM

## 2020-07-25 DIAGNOSIS — N182 Chronic kidney disease, stage 2 (mild): Secondary | ICD-10-CM

## 2020-07-30 ENCOUNTER — Ambulatory Visit: Payer: BC Managed Care – PPO | Admitting: Cardiology

## 2020-08-12 ENCOUNTER — Other Ambulatory Visit: Payer: Self-pay | Admitting: Cardiology

## 2020-08-12 DIAGNOSIS — E1122 Type 2 diabetes mellitus with diabetic chronic kidney disease: Secondary | ICD-10-CM

## 2020-08-15 ENCOUNTER — Other Ambulatory Visit: Payer: Self-pay | Admitting: Neurology

## 2020-08-16 ENCOUNTER — Telehealth: Payer: Self-pay

## 2020-08-16 NOTE — Telephone Encounter (Signed)
Based on the last office note he is on verapamil 120mg  po qday.  Please inform the patient and verify the script with the pharmacy.

## 2020-08-16 NOTE — Telephone Encounter (Signed)
Pharmacists called from [facility] stating that they needed some clarification on a pts. Medication they had that he was on verapamil 120 mg from another doctor but he did not have any more refills. They also had in the system that you had sent in Tarka 180 MG and he picked that one up from the pharmacy but the pt. Stated that he was not on that dosage and was confused. The pharmacists wanted a call back to clarify which prescription the pt. Is supposed to be on.

## 2020-08-16 NOTE — Telephone Encounter (Signed)
Unable to reach patient during allotted time. Will cb on Monday.

## 2020-08-16 NOTE — Telephone Encounter (Signed)
[facility] phone number is [phone] for call back.

## 2020-08-16 NOTE — Telephone Encounter (Signed)
Received a call from patient in regards to Verapamil. Patient states he is unsure what dosage he is supposed to be taking. On pt file there is Verapamil 120mg  (expired) and there is a Verapamil ER 180mg  that can be reconciled. Patient picked up Verapamil 180 from pharmacy today. Patient would like to know what dosage he should be taking and why dosage was changed. Patient is requesting a call back from you between 12:00-2pm. Patient works a late shift and will be resting before then. Please call and advise patient. Thanks!

## 2020-08-16 NOTE — Telephone Encounter (Signed)
My understanding is that he has left our practice.   Also, he is seeing cardiology, Dr. Jacinto Halim who is managing his blood pressure now.  This is what the chart record shows: Amlodipine 10 mg daily  carvedilol 12.5 mg twice daily Hydralazine 25 mg 3 times daily Lisinopril 40 mg daily Verapamil 120 mg once daily

## 2020-08-16 NOTE — Telephone Encounter (Signed)
I called the pharmacy back to let them know that he said he was not coming back to our office anymore and they said he told them the same thing that he would not be using that pharmacy anymore. But I let them know that Dr. Jacinto Halim was handling that medication.

## 2020-08-17 ENCOUNTER — Other Ambulatory Visit: Payer: Self-pay | Admitting: Medical

## 2020-08-20 ENCOUNTER — Telehealth: Payer: Self-pay

## 2020-08-20 ENCOUNTER — Telehealth: Payer: Self-pay | Admitting: Family Medicine

## 2020-08-20 ENCOUNTER — Other Ambulatory Visit: Payer: Self-pay

## 2020-08-20 NOTE — Telephone Encounter (Signed)
Spoke with patient in regards to medication- Verapamil. Patient was very upset because his Verapamil was increased from 120mg -180mg  and would like to know why our office and Providers increased it "without letting him know." I received a message from patient on 8/20 and reached out to Dr.Tolia who stated he did not increase the medication. I informed the patient today that it was his PCP that increased this (according to medication reconciliation) and tried to reach back out to him on 8/20. (It is notated) Patient was raising voice and cursing during entire conversation.Patient hung up the phone mid conversation.

## 2020-08-20 NOTE — Telephone Encounter (Signed)
Pt was prescribed on 01/30/20 at appointment with shane Trandolapril- verapamil 2-180mg . However pharmacy didn't have the combo and divided the meds up to single meds. Pt picked up trandolapril but didn't pick up verapamil 180mg . Pt went to cardiology on 02/13/20 and was told to discontinue trandolapril as he was on lisinopril and cardiology added norvasc and coreg. Cardiology refilled verapamil 120mg  on 02/29/20.  Pt called in for a refill on 08/14/20 to pharmacy, per pharmacy- they must have not looked at dose and just filled what is on file. On file had 120mg  and 180mg  so they refilled the 180mg  (which was orignially sent in as combo med back in February but pt never got combo med as it was divided up and pt only picked up trandoapril).

## 2020-08-20 NOTE — Telephone Encounter (Signed)
Returned call to patient.  Explained what Martie Lee had found out about the Verapamil 180 being part of a combo drug that the pharmacy was out of back in Feb 2021.  He evidently picked up part of the split of the combo and not the Verapamil. He said he didn't know what I was talking about.  I explained to him the cardiologist is now handling his blood pressure meds and whatever they have put him on is what he should be on.  Patient wanted to know if Dr Susann Givens would take him on as a patient and I explained Dr. Susann Givens is not taking any new patients.  I asked him about stating to Selena Batten at his last visit that he was not coming back.  He said I told Dr Susann Givens I was not coming back since [facility] dropped the ball.  I asked him what he meant and he said Dr Jacinto Halim had texted Vincenza Hews to get him in same day for sinuses and Vincenza Hews had left the building and should have set that up before he left.  I read the patient the notes where he asked the front to call pt and schedule appt for that day with another provider or next day with [facility].  Pt states he did not receive a call from [facility], that there was not a message.  Patient states he is so tired of people telling him that they have left messages and they have not.  He said this is so frustrating and said bye.

## 2020-09-01 ENCOUNTER — Other Ambulatory Visit: Payer: Self-pay | Admitting: Medical

## 2020-09-03 ENCOUNTER — Telehealth: Payer: Self-pay | Admitting: Medical

## 2020-09-03 ENCOUNTER — Other Ambulatory Visit: Payer: Self-pay | Admitting: Medical

## 2020-09-03 MED ORDER — VORTIOXETINE HBR 20 MG PO TABS: 20.0000 mg | ORAL_TABLET | Freq: Every day | ORAL | 0 refills | Status: DC

## 2020-09-03 NOTE — Telephone Encounter (Signed)
Is this appropriate?  

## 2020-09-03 NOTE — Telephone Encounter (Signed)
I have not seen a refill request yet.  This is the first I've seen of it.   I just now sent a refill without additional refills.  90 days will give him time to establish with another provider.

## 2020-09-03 NOTE — Telephone Encounter (Signed)
Pt called requesting 90 day supply on Trintellix  - he states pharmacy has sent [place] multiple refill requests and we have not responded I advised pt that I did not see a request on my end   He states he wants 90 supply refills on all his meds ( he could not give me specific names of meds) until he can find another doctor

## 2020-09-05 ENCOUNTER — Other Ambulatory Visit: Payer: Self-pay | Admitting: Cardiology

## 2020-09-21 ENCOUNTER — Other Ambulatory Visit: Payer: Self-pay | Admitting: Cardiology

## 2020-10-18 ENCOUNTER — Other Ambulatory Visit: Payer: Self-pay | Admitting: Cardiology

## 2020-10-18 DIAGNOSIS — E1122 Type 2 diabetes mellitus with diabetic chronic kidney disease: Secondary | ICD-10-CM

## 2020-10-21 ENCOUNTER — Other Ambulatory Visit: Payer: Self-pay | Admitting: Cardiology

## 2020-10-21 ENCOUNTER — Other Ambulatory Visit: Payer: Self-pay | Admitting: Medical

## 2020-10-29 ENCOUNTER — Encounter: Payer: Self-pay | Admitting: Medical

## 2020-10-29 ENCOUNTER — Other Ambulatory Visit: Payer: Self-pay

## 2020-10-29 ENCOUNTER — Ambulatory Visit: Payer: BC Managed Care – PPO | Admitting: Medical

## 2020-10-29 VITALS — BP 124/72 | HR 68 | Ht 70.0 in | Wt 226.2 lb

## 2020-10-29 DIAGNOSIS — Z794 Long term (current) use of insulin: Secondary | ICD-10-CM | POA: Diagnosis not present

## 2020-10-29 DIAGNOSIS — F32A Depression, unspecified: Secondary | ICD-10-CM

## 2020-10-29 DIAGNOSIS — I10 Essential (primary) hypertension: Secondary | ICD-10-CM

## 2020-10-29 DIAGNOSIS — Z23 Encounter for immunization: Secondary | ICD-10-CM

## 2020-10-29 DIAGNOSIS — E785 Hyperlipidemia, unspecified: Secondary | ICD-10-CM

## 2020-10-29 DIAGNOSIS — E1165 Type 2 diabetes mellitus with hyperglycemia: Secondary | ICD-10-CM | POA: Diagnosis not present

## 2020-10-29 DIAGNOSIS — I2510 Atherosclerotic heart disease of native coronary artery without angina pectoris: Secondary | ICD-10-CM

## 2020-10-29 DIAGNOSIS — Z79899 Other long term (current) drug therapy: Secondary | ICD-10-CM

## 2020-10-29 DIAGNOSIS — E291 Testicular hypofunction: Secondary | ICD-10-CM

## 2020-10-29 DIAGNOSIS — F419 Anxiety disorder, unspecified: Secondary | ICD-10-CM

## 2020-10-29 DIAGNOSIS — Z7185 Encounter for immunization safety counseling: Secondary | ICD-10-CM

## 2020-10-29 NOTE — Progress Notes (Signed)
Subjective:   [name] [name] is an 59 y.o. male who presents for follow up of Type 2 diabetes mellitus and med check.   Here for med check and form completion.  He had a recent DOT physical.  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  So currently cannot work until this happens.  They need a release from cardiology regarding his renal artery study and catheterization in the last few months.  They need a form for using insulin as a diabetic or some other therapy instead of insulin.  He had his Covid vaccine early in the year and just had his Covid booster last week.  Diabetes: Date of diagnosis of diabetes: 2008 Current treatments: Lantus 100 units daily, Jardiance 25 mg daily Medication compliance: good  Patient is checking home blood sugars.   Home blood sugar records: 130 at the lowest fasting, but typically in the high 100s.  In the afternoon can have numbers even close to 300 Current symptoms include: none. Patient denies hyperglycemia, hypoglycemia , nausea, paresthesia of the feet, polydipsia, polyuria and visual disturbances.  Patient is checking their feet daily. Foot concerns (callous, ulcer, wound, thickened nails, toenail fungus, skin fungus, hammer toe): none Last dilated eye exam recently. Current diet: in general, an "unhealthy" diet , stress eating of late Current exercise: walking  Hypertension-compliant with medication without complaint  Hyperlipidemia-compliant with medication without complaint  Low testosterone, hypogonadism-he had not been using testosterone lately but is considering going back on therapy  Depression-lately he was in a down mood.  He is still taking Trintellix but is stress eating as a coping mechanism right now.  He was frustrated about several things in general but feels like he is doing a little better now   Past Medical History:  Diagnosis Date  . Anxiety   . Coronary artery disease   .  Depression   . Diabetes mellitus    Type 2  . Enlarged prostate   . Environmental allergies   . Environmental and seasonal allergies   . GERD (gastroesophageal reflux disease)   . Headache(784.0)    sinus and migraines  . Hyperlipidemia   . Hypertension   . IBS (irritable bowel syndrome)   . Low testosterone   . Renal artery stenosis (HCC)   . Sleep apnea    has used BiPAP in past; last sleep study >10 years    Current Outpatient Medications on File Prior to Visit  Medication Sig Dispense Refill  . amLODipine (NORVASC) 10 MG tablet TAKE 1 TABLET(10 MG) BY MOUTH DAILY 90 tablet 1  . atorvastatin (LIPITOR) 80 MG tablet TAKE 1 TABLET(80 MG) BY MOUTH AT BEDTIME 90 tablet 3  . carvedilol (COREG) 12.5 MG tablet TAKE 1 TABLET BY MOUTH TWICE DAILY. HOLD IF BLOOD PRESSURE LESS THAN 180 tablet 0  . cetirizine (ZYRTEC) 10 MG tablet Take 1 tablet (10 mg total) by mouth daily. 30 tablet 2  . empagliflozin (JARDIANCE) 25 MG TABS tablet Take 25 mg by mouth daily. 90 tablet 3  . fluticasone (FLONASE) 50 MCG/ACT nasal spray Place 2 sprays into both nostrils daily.    . hydrALAZINE (APRESOLINE) 25 MG tablet TAKE 1 TABLET(25 MG) BY MOUTH THREE TIMES DAILY 270 tablet 1  . insulin glargine (LANTUS) 100 UNIT/ML injection Inject 100 Units into the skin daily.    . Insulin Pen Needle (BD PEN NEEDLE NANO U/F) 32G X 4 MM MISC 1 each by Does  not apply route at bedtime. 100 each 11  . lisinopril (ZESTRIL) 40 MG tablet TAKE 1 TABLET(40 MG) BY MOUTH DAILY 90 tablet 1  . Needles & Syringes MISC As directed 9ml of testosterone every 14 days 100 each 5  . rOPINIRole (REQUIP) 0.25 MG tablet TAKE 1 TABLET(0.25 MG) BY MOUTH AT BEDTIME 30 tablet 5  . tamsulosin (FLOMAX) 0.4 MG CAPS capsule Take 1 capsule (0.4 mg total) by mouth daily. 90 capsule 3  . vortioxetine HBr (TRINTELLIX) 20 MG TABS tablet Take 1 tablet (20 mg total) by mouth daily. 90 tablet 0  . amoxicillin-clavulanate (AUGMENTIN) 875-125 MG tablet  Take 1 tablet by mouth 2 (two) times daily. (Patient not taking: Reported on 10/29/2020) 20 tablet 0  . insulin degludec (TRESIBA FLEXTOUCH) 100 UNIT/ML FlexTouch Pen Inject 0.7 mLs (70 Units total) into the skin daily at 10 pm. (Patient not taking: Reported on 10/29/2020) 3 mL 0  . testosterone cypionate (DEPOTESTOSTERONE CYPIONATE) 200 MG/ML injection Inject 1 mL (200 mg total) into the muscle every 14 (fourteen) days. (Patient not taking: Reported on 10/29/2020) 30 mL 3  . verapamil (CALAN) 120 MG tablet Take 1 tablet (120 mg total) by mouth once for 1 dose. (Patient taking differently: Take 120 mg by mouth daily. ) 90 tablet 1   No current facility-administered medications on file prior to visit.     The following portions of the patient's history were reviewed and updated as appropriate: allergies, current medications, past family history, past medical history, past social history, past surgical history and problem list.  ROS as in subjective above    Objective:   BP 124/72   Pulse 68   Ht 5\' 10"  (1.778 m)   Wt 226 lb 3.2 oz (102.6 kg)   SpO2 97%   BMI 32.46 kg/m   General appearence: alert, no distress, WD/WN,  Neck: supple, no lymphadenopathy, no thyromegaly, no masses Heart: RRR, normal S1, S2, no murmurs Lungs: CTA bilaterally, no wheezes, rhonchi, or rales Pulses: 2+ symmetric, upper and lower extremities, normal cap refill Extremities: No edema    Assessment:   Encounter Diagnoses  Name Primary?  . Type 2 diabetes mellitus with hyperglycemia, with long-term current use of insulin (HCC) Yes  . Need for influenza vaccination   . Hypogonadism in male   . Resistant hypertension   . Coronary artery disease involving native heart without angina pectoris, unspecified vessel or lesion type   . Hyperlipidemia, unspecified hyperlipidemia type   . High risk medication use   . Vaccine counseling   . Anxiety and depression      Plan:   Diabetes Mellitus type  2: Education: Reviewed 'ABCs' of diabetes management (respective goals in parentheses):  A1C (<7), blood pressure (<130/80), and cholesterol (LDL <100)  Diabetes: Medications: Continue current Lantus and Jardiance, but pending labs we will consider other options or referral to endocrinology  Education today included:  blood sugar goals, complications of diabetes mellitus, hypoglycemia prevention and treatment, self-monitoring of blood glucose skills and nutrition Advised daily foot checks, yearly diabetic eye exams, dental hygiene Compliance at present is estimated to be good. Efforts to improve compliance (if necessary) will be directed at dietary modifications: low sugar diet and increased exercise.  Hypertension-managed by cardiology, continue current medications  Hyperlipidemia-continue statin  Depression-continue Trintellix, encouraged counseling  Hypogonadism, low testosterone-pending labs, he may want to restart therapy  Counseled on the influenza virus vaccine.  Vaccine information sheet given.  Influenza vaccine given after consent obtained.  Yomar was seen today for consult.  Diagnoses and all orders for this visit:  Type 2 diabetes mellitus with hyperglycemia, with long-term current use of insulin (HCC) -     Hemoglobin A1c -     Comprehensive metabolic panel  Need for influenza vaccination  Hypogonadism in male  Resistant hypertension -     Comprehensive metabolic panel  Coronary artery disease involving native heart without angina pectoris, unspecified vessel or lesion type  Hyperlipidemia, unspecified hyperlipidemia type  High risk medication use  Vaccine counseling  Anxiety and depression  Other orders -     Flu Vaccine QUAD 6+ mos PF IM (Fluarix Quad PF)    Follow up: 4 months for routine diabetic follow-up

## 2020-10-30 LAB — COMPREHENSIVE METABOLIC PANEL
ALT: 30 IU/L (ref 0–44)
AST: 20 IU/L (ref 0–40)
Albumin/Globulin Ratio: 1.8 (ref 1.2–2.2)
Albumin: 4.4 g/dL (ref 3.8–4.9)
Alkaline Phosphatase: 79 IU/L (ref 44–121)
BUN/Creatinine Ratio: 22 — ABNORMAL HIGH (ref 9–20)
BUN: 26 mg/dL — ABNORMAL HIGH (ref 6–24)
Bilirubin Total: 0.4 mg/dL (ref 0.0–1.2)
CO2: 23 mmol/L (ref 20–29)
Calcium: 10 mg/dL (ref 8.7–10.2)
Chloride: 105 mmol/L (ref 96–106)
Creatinine, Ser: 1.17 mg/dL (ref 0.76–1.27)
GFR calc Af Amer: 80 mL/min/{1.73_m2} (ref >59)
GFR calc non Af Amer: 69 mL/min/{1.73_m2} (ref >59)
Globulin, Total: 2.5 g/dL (ref 1.5–4.5)
Glucose: 204 mg/dL — ABNORMAL HIGH (ref 65–99)
Potassium: 4.6 mmol/L (ref 3.5–5.2)
Sodium: 142 mmol/L (ref 134–144)
Total Protein: 6.9 g/dL (ref 6.0–8.5)

## 2020-10-30 LAB — HEMOGLOBIN A1C
Est. average glucose Bld gHb Est-mCnc: 232 mg/dL
Hgb A1c MFr Bld: 9.7 % — ABNORMAL HIGH (ref 4.8–5.6)

## 2020-10-31 ENCOUNTER — Other Ambulatory Visit: Payer: Self-pay | Admitting: Medical

## 2020-10-31 ENCOUNTER — Telehealth: Payer: Self-pay

## 2020-10-31 MED ORDER — INSULIN GLARGINE 100 UNIT/ML ~~LOC~~ SOLN: 110.0000 [IU] | Freq: Every day | SUBCUTANEOUS | 2 refills | Status: DC

## 2020-10-31 MED ORDER — TESTOSTERONE CYPIONATE 200 MG/ML IM SOLN: 200.0000 mg | INTRAMUSCULAR | 2 refills | Status: DC

## 2020-10-31 NOTE — Telephone Encounter (Signed)
Regarding his insulin treated diabetes mellitus assessment form, there are some segments you will need to call him and ask questions as I do not know the answer to  On page 1 I do not know if his glucometer is electronic storage of readings.  You can read the question for him to write his answers down.  On page 3 and 4 there are questions about eye disease and eye exam.  I do not have any eye exam data on file for him.  We need to get copies of this.  He needs to answer those questions before we complete this form.  Keep in mind for future reference, this form asks whether or not he is compliant in getting me glucose records that are regularly reviewed.  It asks not just verbal records but actually on a chart either electronically on his phone or on a piece of paper that shows glucose readings.  The whole point of this is to make sure he is not high risk for driving impaired or at risk of sudden incapacitation behind the wheel. That is why they want to know about his potential for diabetes complications such as eye disease, nerve disease, heart disease and other.  So I cannot comment on his eye disease if I do not have records from his eye doctor.  Regarding him calling 2 days in a row to request this form and the request a call back, this is a 4 page form, requires me to consider his safety behind the wheel, and since we see patients throughout the day and it takes me some time to consider his overall health, this wasn't a simple check the box kind of form.  My sister and other family members are lucky if their doctor responds to their labs within 7 - 10 days. I try to follow up within 1-2 days on lab results.   So we just ask for a little patience.

## 2020-10-31 NOTE — Telephone Encounter (Signed)
Patient is requesting a refill on Lantus. Please advise.

## 2020-11-01 ENCOUNTER — Other Ambulatory Visit: Payer: Self-pay

## 2020-11-01 ENCOUNTER — Encounter: Payer: Self-pay | Admitting: Medical

## 2020-11-01 DIAGNOSIS — E1165 Type 2 diabetes mellitus with hyperglycemia: Secondary | ICD-10-CM

## 2020-11-01 NOTE — Telephone Encounter (Signed)
Should he get testosterone inj every 2 or 3 weeks? He was getting them every 3 weeks.

## 2020-11-01 NOTE — Telephone Encounter (Signed)
I meant to say he was getting them every 2 weeks.

## 2020-11-01 NOTE — Telephone Encounter (Signed)
Patient informed. Form has been completed.

## 2020-11-01 NOTE — Telephone Encounter (Signed)
Begin back at every 3 weeks

## 2020-11-05 ENCOUNTER — Other Ambulatory Visit: Payer: Self-pay | Admitting: Medical

## 2020-11-05 MED ORDER — INSULIN GLARGINE 100 UNIT/ML SOLOSTAR PEN
110.0000 [IU] | PEN_INJECTOR | Freq: Every day | SUBCUTANEOUS | 5 refills | Status: DC
Start: 2020-11-05 — End: 2021-01-15

## 2020-11-06 ENCOUNTER — Other Ambulatory Visit: Payer: BC Managed Care – PPO

## 2020-11-07 ENCOUNTER — Ambulatory Visit: Payer: BC Managed Care – PPO | Admitting: Internal Medicine

## 2020-11-13 ENCOUNTER — Ambulatory Visit: Payer: BC Managed Care – PPO | Admitting: Internal Medicine

## 2020-11-13 ENCOUNTER — Other Ambulatory Visit: Payer: Self-pay

## 2020-11-13 ENCOUNTER — Encounter: Payer: Self-pay | Admitting: Internal Medicine

## 2020-11-13 VITALS — BP 124/74 | HR 70 | Ht 70.0 in | Wt 229.4 lb

## 2020-11-13 DIAGNOSIS — Z794 Long term (current) use of insulin: Secondary | ICD-10-CM

## 2020-11-13 DIAGNOSIS — E1165 Type 2 diabetes mellitus with hyperglycemia: Secondary | ICD-10-CM | POA: Diagnosis not present

## 2020-11-13 LAB — POCT GLUCOSE (DEVICE FOR HOME USE): Glucose Fasting, POC: 146 mg/dL — AB (ref 70–99)

## 2020-11-13 MED ORDER — TRULICITY 0.75 MG/0.5ML ~~LOC~~ SOAJ
0.7500 mg | SUBCUTANEOUS | 4 refills | Status: DC
Start: 2020-11-13 — End: 2020-11-14

## 2020-11-13 NOTE — Progress Notes (Signed)
Name: [name] [name]  MRN/ DOB: [id number], 1961/11/18   Age/ Sex: 59 y.o., male    PCP: Jac Canavan, PA-C   Reason for Endocrinology Evaluation: Type 2 Diabetes Mellitus     Date of Initial Endocrinology Visit: 11/13/2020     PATIENT IDENTIFIER: [name] [name] is a 59 y.o. male with a past medical history of T2DM , dyslipidemia . The patient presented for initial endocrinology clinic visit on 11/13/2020 for consultative assistance with his diabetes management.    HPI: Mr. Rothe was    Diagnosed with DM in 2008 Prior Medications tried/Intolerance: Metformin- diarrhea , glipizide - hypoglycemia  Currently checking blood sugars 2 x / day Hypoglycemia episodes : no              Hemoglobin A1c has ranged from 8.4% in 2021, peaking at 10.5% in 2020. Patient required assistance for hypoglycemia: no Patient has required hospitalization within the last 1 year from hyper or hypoglycemia: no  In terms of diet, the patient eats 2 -3 meals a day, snacks 1 a day. Drinks sugar-sweetened beverages   Has polyuria and polydipsia  Denies nausea or diarrhea      HOME DIABETES REGIMEN: Lantus 130 units daily  Jardiance 25 mg daily      Statin: on Atorvastatin  ACE-I/ARB: yes Prior Diabetic Education: yes   METER DOWNLOAD SUMMARY: Did not bring      DIABETIC COMPLICATIONS: Microvascular complications:    Denies: CKD, retinopathy , neuropathy   Last eye exam: Completed 03/2020  Macrovascular complications:   CAD   Denies: PVD, CVA   PAST HISTORY: Past Medical History:  Past Medical History:  Diagnosis Date  . Anxiety   . Coronary artery disease   . Depression   . Diabetes mellitus    Type 2  . Enlarged prostate   . Environmental allergies   . Environmental and seasonal allergies   . GERD (gastroesophageal reflux disease)   . Headache(784.0)    sinus and migraines  . Hyperlipidemia   . Hypertension   . IBS (irritable bowel syndrome)   . Low  testosterone   . Renal artery stenosis (HCC)   . Sleep apnea    has used BiPAP in past; last sleep study >10 years   Past Surgical History:  Past Surgical History:  Procedure Laterality Date  . COLONOSCOPY  09/2018   normal, repeat in 10 years per patient.  [place], [place]  . KNEE ARTHROSCOPY Left   . RENAL ANGIOGRAPHY N/A 07/16/2020   Procedure: RENAL ANGIOGRAPHY;  Surgeon: Yates Decamp, MD;  Location: [facility];  Service: Cardiovascular;  Laterality: N/A;  . SACROILIAC JOINT FUSION Right 05/02/2014   Procedure: SACROILIAC JOINT FUSION;  Surgeon: Emilee Hero, MD;  Location: [facility] OR;  Service: Orthopedics;  Laterality: Right;  Right sided sacroiliac joint fusion  . TONSILLECTOMY     uvula removed      Social History:  reports that he quit smoking about 28 years ago. He has a 10.00 pack-year smoking history. He has never used smokeless tobacco. He reports that he does not drink alcohol and does not use drugs. Family History:  Family History  Problem Relation Age of Onset  . Cancer Mother        lung  . Cancer Father        prostate, mets to bone, lung  . Cancer Brother        melanoma  . Heart disease Maternal Grandmother  MI  . Lung disease Maternal Grandfather        black lung  . Heart disease Paternal Grandmother   . Stroke Paternal Grandmother   . Heart disease Paternal Grandfather   . Heart disease Brother 14       MI  . Diabetes Brother      HOME MEDICATIONS: Allergies as of 11/13/2020      Reactions   Pravastatin Other (See Comments)   Joint myalgias   Trazodone And Nefazodone    Had restless legs   Hydrochlorothiazide Other (See Comments)   lightheadedness   Metformin And Related Diarrhea      Medication List       Accurate as of November 13, 2020  9:03 AM. If you have any questions, ask your nurse or doctor.        amLODipine 10 MG tablet Commonly known as: NORVASC TAKE 1 TABLET(10 MG) BY MOUTH DAILY   atorvastatin 80 MG tablet  Commonly known as: LIPITOR TAKE 1 TABLET(80 MG) BY MOUTH AT BEDTIME   BD Pen Needle Nano U/F 32G X 4 MM Misc Generic drug: Insulin Pen Needle 1 each by Does not apply route at bedtime.   carvedilol 12.5 MG tablet Commonly known as: COREG TAKE 1 TABLET BY MOUTH TWICE DAILY. HOLD IF BLOOD PRESSURE LESS THAN   cetirizine 10 MG tablet Commonly known as: ZYRTEC Take 1 tablet (10 mg total) by mouth daily.   fluticasone 50 MCG/ACT nasal spray Commonly known as: FLONASE Place 2 sprays into both nostrils daily.   hydrALAZINE 25 MG tablet Commonly known as: APRESOLINE TAKE 1 TABLET(25 MG) BY MOUTH THREE TIMES DAILY   insulin glargine 100 UNIT/ML Solostar Pen Commonly known as: LANTUS Inject 110 Units into the skin daily.   Jardiance 25 MG Tabs tablet Generic drug: empagliflozin Take 25 mg by mouth daily.   lisinopril 40 MG tablet Commonly known as: ZESTRIL TAKE 1 TABLET(40 MG) BY MOUTH DAILY   Needles & Syringes Misc As directed 4ml of testosterone every 14 days   rOPINIRole 0.25 MG tablet Commonly known as: REQUIP TAKE 1 TABLET(0.25 MG) BY MOUTH AT BEDTIME   tamsulosin 0.4 MG Caps capsule Commonly known as: FLOMAX Take 1 capsule (0.4 mg total) by mouth daily.   testosterone cypionate 200 MG/ML injection Commonly known as: DEPOTESTOSTERONE CYPIONATE Inject 1 mL (200 mg total) into the muscle every 14 (fourteen) days.   verapamil 120 MG tablet Commonly known as: CALAN Take 1 tablet (120 mg total) by mouth once for 1 dose. What changed: when to take this   vortioxetine HBr 20 MG Tabs tablet Commonly known as: Trintellix Take 1 tablet (20 mg total) by mouth daily.        ALLERGIES: Allergies  Allergen Reactions  . Pravastatin Other (See Comments)    Joint myalgias  . Trazodone And Nefazodone     Had restless legs  . Hydrochlorothiazide Other (See Comments)    lightheadedness  . Metformin And Related Diarrhea     REVIEW OF SYSTEMS: A  comprehensive ROS was conducted with the patient and is negative except as per HPI    OBJECTIVE:   VITAL SIGNS: BP 124/74   Pulse 70   Ht 5\' 10"  (1.778 m)   Wt 229 lb 6 oz (104 kg)   SpO2 98%   BMI 32.91 kg/m    PHYSICAL EXAM:  General: Pt appears well and is in NAD  Neck: General: Supple without adenopathy or carotid bruits. Thyroid:  Thyroid size normal.  No goiter or nodules appreciated. No thyroid bruit.  Lungs: Clear with good BS bilat with no rales, rhonchi, or wheezes  Heart: RRR with normal S1 and S2 and no gallops; no murmurs; no rub  Abdomen: Normoactive bowel sounds, soft, nontender, without masses or organomegaly palpable  Extremities:  Lower extremities - No pretibial edema. No lesions.  Skin: Normal texture and temperature to palpation. No rash noted.   Neuro: MS is good with appropriate affect, pt is alert and Ox3    DATA REVIEWED:  Lab Results  Component Value Date   HGBA1C 9.7 (H) 10/29/2020   HGBA1C 8.9 (H) 05/13/2020   HGBA1C 8.4 (H) 01/30/2020   Lab Results  Component Value Date   MICROALBUR 0.50 11/11/2010   LDLCALC 78 07/05/2020   CREATININE 1.17 10/29/2020   Lab Results  Component Value Date   MICRALBCREAT 5 01/30/2020    Lab Results  Component Value Date   CHOL 126 07/05/2020   HDL 29 (L) 07/05/2020   LDLCALC 78 07/05/2020   LDLDIRECT 93 08/20/2009   TRIG 100 07/05/2020   CHOLHDL 4.4 05/13/2020        ASSESSMENT / PLAN / RECOMMENDATIONS:   1) Type 2 Diabetes Mellitus, Poorly controlled, Without complications - Most recent A1c of 9.7 %. Goal A1c < 7.0 %.    Plan: GENERAL: I have discussed with the patient the pathophysiology of diabetes. We went over the natural progression of the disease. We talked about both insulin resistance and insulin deficiency. We stressed the importance of lifestyle changes including diet and exercise. I explained the complications associated with diabetes including retinopathy, nephropathy, neuropathy as well  as increased risk of cardiovascular disease. We went over the benefit seen with glycemic control.    I explained to the patient that diabetic patients are at higher than normal risk for amputations.   Discussed add -on therpay with GLP-1 agonists, discussed GI side effects, he has no Hx of pancreatitis.     MEDICATIONS: - Decrease Lantus to 110 units daily  - Start Trulicity 0.75 mg weekly  - Continue jardiance 25 mg daily   EDUCATION / INSTRUCTIONS:  BG monitoring instructions: Patient is instructed to check his blood sugars 2 times a day, fasting and bedtime.  Call [facility] if: BG persistently < 70  . I reviewed the Rule of 15 for the treatment of hypoglycemia in detail with the patient. Literature supplied.   2) Diabetic complications:   Eye: Does not have known diabetic retinopathy.   Neuro/ Feet: Does not have known diabetic peripheral neuropathy. Renal: Patient does not have known baseline CKD. He is on an ACEI/ARB at present.Check urine albumin/creatinine ratio yearly starting at time of diagnosis  3) Lipids: Patient is on Atorvastatin 80 mg daily .        Signed electronically by: Lyndle Herrlich, MD  [facility]  [facility] [address] [place], [place] [address] Phone: [phone] FAX: [phone]   CC: Jamill, [place], PA-C [address] [place] [address] Phone: [phone]  Fax: [phone]    Return to Endocrinology clinic as below: Future Appointments  Date Time Provider Department Center  11/19/2020  8:30 AM Butch Penny, NP [facility] None  01/01/2021 11:00 AM Lyn Records, MD [facility]

## 2020-11-13 NOTE — Patient Instructions (Signed)
-   Decrease Lantus to 110 units daily  - Start Trulicity 0.75 mg weekly  - Continue jardiance 25 mg daily    Check sugar fasting and bedtime  Bring meter on next visit    HOW TO TREAT LOW BLOOD SUGARS (Blood sugar LESS THAN 70 MG/DL)  Please follow the RULE OF 15 for the treatment of hypoglycemia treatment (when your (blood sugars are less than 70 mg/dL)    STEP 1: Take 15 grams of carbohydrates when your blood sugar is low, which includes:   3-4 GLUCOSE TABS  OR  3-4 OZ OF JUICE OR REGULAR SODA OR  ONE TUBE OF GLUCOSE GEL     STEP 2: RECHECK blood sugar in 15 MINUTES STEP 3: If your blood sugar is still low at the 15 minute recheck --> then, go back to STEP 1 and treat AGAIN with another 15 grams of carbohydrates.

## 2020-11-14 ENCOUNTER — Telehealth: Payer: Self-pay | Admitting: Internal Medicine

## 2020-11-14 MED ORDER — TRULICITY 0.75 MG/0.5ML ~~LOC~~ SOAJ: 0.7500 mg | SUBCUTANEOUS | 2 refills | Status: DC

## 2020-11-14 NOTE — Telephone Encounter (Signed)
Refill as requested 

## 2020-11-14 NOTE — Telephone Encounter (Signed)
Patient called and requested that his TRULICITY) 0.75 MG/0.5ML be sent to [facility] [address] because of [facility] computer system being down and them not being able to fill RX for patients.  [facility] Tr[facility] because of the [employer] technical problem

## 2020-11-15 ENCOUNTER — Encounter: Payer: Self-pay | Admitting: Internal Medicine

## 2020-11-15 MED ORDER — TRULICITY 0.75 MG/0.5ML ~~LOC~~ SOAJ: 0.7500 mg | SUBCUTANEOUS | 2 refills | Status: DC

## 2020-11-15 NOTE — Telephone Encounter (Signed)
Patient called and requested that his TRULICITY) 0.75 MG/0.5ML be re-sent to the [facility] on [address] now that the [employer] computer system is back in service.  [facility] Tr[facility] any way

## 2020-11-15 NOTE — Telephone Encounter (Signed)
Sent as requested.

## 2020-11-15 NOTE — Addendum Note (Signed)
Addended by: Tawnya Crook on: 11/15/2020 02:23 PM   Modules accepted: Orders

## 2020-11-18 ENCOUNTER — Other Ambulatory Visit: Payer: Self-pay | Admitting: Medical

## 2020-11-19 ENCOUNTER — Ambulatory Visit: Payer: BC Managed Care – PPO | Admitting: Adult Health

## 2020-11-19 ENCOUNTER — Other Ambulatory Visit: Payer: Self-pay | Admitting: Cardiology

## 2020-11-19 ENCOUNTER — Other Ambulatory Visit: Payer: Self-pay | Admitting: Medical

## 2020-11-20 ENCOUNTER — Other Ambulatory Visit: Payer: Self-pay

## 2020-11-20 MED ORDER — VERAPAMIL HCL 120 MG PO TABS: 120.0000 mg | ORAL_TABLET | Freq: Once | ORAL | 1 refills | Status: DC

## 2020-11-28 ENCOUNTER — Other Ambulatory Visit: Payer: Self-pay | Admitting: Medical

## 2020-12-03 ENCOUNTER — Other Ambulatory Visit: Payer: Self-pay | Admitting: Medical

## 2020-12-04 NOTE — Telephone Encounter (Signed)
Refill Trintellix 90 and no refill.   Schedule f/u in 62mo from last visit.

## 2020-12-04 NOTE — Telephone Encounter (Signed)
Requested patient call and schedule 3 month follow up

## 2020-12-20 ENCOUNTER — Other Ambulatory Visit: Payer: Self-pay

## 2020-12-20 ENCOUNTER — Emergency Department (HOSPITAL_COMMUNITY)
Admission: EM | Admit: 2020-12-20 | Discharge: 2020-12-20 | Disposition: A | Discharge status: Home / Self Care | Payer: BC Managed Care – PPO | Attending: Emergency Medicine | Admitting: Emergency Medicine

## 2020-12-20 ENCOUNTER — Encounter (HOSPITAL_COMMUNITY): Payer: Self-pay | Admitting: Emergency Medicine

## 2020-12-20 DIAGNOSIS — Z7984 Long term (current) use of oral hypoglycemic drugs: Secondary | ICD-10-CM | POA: Insufficient documentation

## 2020-12-20 DIAGNOSIS — R112 Nausea with vomiting, unspecified: Secondary | ICD-10-CM | POA: Insufficient documentation

## 2020-12-20 DIAGNOSIS — Z87891 Personal history of nicotine dependence: Secondary | ICD-10-CM | POA: Insufficient documentation

## 2020-12-20 DIAGNOSIS — I2510 Atherosclerotic heart disease of native coronary artery without angina pectoris: Secondary | ICD-10-CM | POA: Insufficient documentation

## 2020-12-20 DIAGNOSIS — Z20822 Contact with and (suspected) exposure to covid-19: Secondary | ICD-10-CM | POA: Insufficient documentation

## 2020-12-20 DIAGNOSIS — R197 Diarrhea, unspecified: Secondary | ICD-10-CM | POA: Diagnosis not present

## 2020-12-20 DIAGNOSIS — I10 Essential (primary) hypertension: Secondary | ICD-10-CM | POA: Insufficient documentation

## 2020-12-20 DIAGNOSIS — Z794 Long term (current) use of insulin: Secondary | ICD-10-CM | POA: Diagnosis not present

## 2020-12-20 DIAGNOSIS — Z79899 Other long term (current) drug therapy: Secondary | ICD-10-CM | POA: Insufficient documentation

## 2020-12-20 DIAGNOSIS — E119 Type 2 diabetes mellitus without complications: Secondary | ICD-10-CM | POA: Diagnosis not present

## 2020-12-20 LAB — CBC
HCT: 54.7 % — ABNORMAL HIGH (ref 39.0–52.0)
Hemoglobin: 17.8 g/dL — ABNORMAL HIGH (ref 13.0–17.0)
MCH: 30.2 pg (ref 26.0–34.0)
MCHC: 32.5 g/dL (ref 30.0–36.0)
MCV: 92.7 fL (ref 80.0–100.0)
Platelets: 275 10*3/uL (ref 150–400)
RBC: 5.9 MIL/uL — ABNORMAL HIGH (ref 4.22–5.81)
RDW: 12.9 % (ref 11.5–15.5)
WBC: 12.7 10*3/uL — ABNORMAL HIGH (ref 4.0–10.5)
nRBC: 0 % (ref 0.0–0.2)

## 2020-12-20 LAB — COMPREHENSIVE METABOLIC PANEL
ALT: 32 U/L (ref 0–44)
AST: 23 U/L (ref 15–41)
Albumin: 4.3 g/dL (ref 3.5–5.0)
Alkaline Phosphatase: 70 U/L (ref 38–126)
Anion gap: 11 (ref 5–15)
BUN: 23 mg/dL — ABNORMAL HIGH (ref 6–20)
CO2: 21 mmol/L — ABNORMAL LOW (ref 22–32)
Calcium: 9.4 mg/dL (ref 8.9–10.3)
Chloride: 107 mmol/L (ref 98–111)
Creatinine, Ser: 1.26 mg/dL — ABNORMAL HIGH (ref 0.61–1.24)
GFR, Estimated: >60 mL/min (ref >60)
Glucose, Bld: 152 mg/dL — ABNORMAL HIGH (ref 70–99)
Potassium: 4 mmol/L (ref 3.5–5.1)
Sodium: 139 mmol/L (ref 135–145)
Total Bilirubin: 0.9 mg/dL (ref 0.3–1.2)
Total Protein: 7.3 g/dL (ref 6.5–8.1)

## 2020-12-20 LAB — LIPASE, BLOOD: Lipase: 31 U/L (ref 11–51)

## 2020-12-20 LAB — RESP PANEL BY RT-PCR (FLU A&B, COVID) ARPGX2
Influenza A by PCR: NEGATIVE
Influenza B by PCR: NEGATIVE
SARS Coronavirus 2 by RT PCR: NEGATIVE

## 2020-12-20 MED ORDER — SODIUM CHLORIDE 0.9 % IV BOLUS
1000.0000 mL | Freq: Once | INTRAVENOUS | Status: AC
  Administered 2020-12-20: 1000 mL via INTRAVENOUS

## 2020-12-20 MED ORDER — ONDANSETRON HCL 4 MG/2ML IJ SOLN: 4.0000 mg | Freq: Once | INTRAMUSCULAR | Status: DC

## 2020-12-20 NOTE — Discharge Instructions (Addendum)
We discussed your laboratory results at length.  Please continue to hydrate with plenty of liquids  If you experience any fever, worsening symptoms please return to the emergency department.

## 2020-12-20 NOTE — ED Notes (Signed)
Please call [phone] [facility] to come pick up Patient.

## 2020-12-20 NOTE — ED Triage Notes (Signed)
Pt arrives to ED with c/o diarrhea since yesterday and emesis starting today. Pt states he is nauseous and feels generally weak. Denies fevers, chills, CP.

## 2020-12-20 NOTE — ED Provider Notes (Signed)
[facility]   CSN: [id number] Arrival date & time: 12/20/20  [id number]     History Chief Complaint  Patient presents with  . Diarrhea    [name] [name] is a 59 y.o. male.  59 y.o male with a PMH of IBS, DM, CAD presents to the ED with a chief complaint of nausea, vomiting and diarrhea x yesterday. Patient reports symptoms began 3 hours after eating a chicken philly sub.  Symptoms consisted of abdominal cramping along the lower and right aspect.  Reports taking Imodium in order to help with 2 episodes of diarrhea yesterday but thinks he likely vomited this.  He also endorses diarrhea that is nonbloody in nature.  Patient does have a prior history of IV S however this is mostly consistent of constipation.  Alleviated abdominal discomfort by vomiting.  No exacerbating factors.  Denies any fever, chest pain, shortness of breath.  No prior episodes similar to these, no sick contacts, no recent antibiotic use.  No surgical history to his abdomen.     The history is provided by the patient.  Diarrhea Quality:  Unable to specify Severity:  Moderate Onset quality:  Sudden Number of episodes:  4 total since yesterday  Duration:  1 day Timing:  Constant Relieved by:  Nothing Ineffective treatments:  Anti-motility medications Associated symptoms: abdominal pain and vomiting   Associated symptoms: no chills, no fever and no headaches   Risk factors: no recent antibiotic use and no sick contacts        Past Medical History:  Diagnosis Date  . Anxiety   . Coronary artery disease   . Depression   . Diabetes mellitus    Type 2  . Enlarged prostate   . Environmental allergies   . Environmental and seasonal allergies   . GERD (gastroesophageal reflux disease)   . Headache(784.0)    sinus and migraines  . Hyperlipidemia   . Hypertension   . IBS (irritable bowel syndrome)   . Low testosterone   . Renal artery stenosis (HCC)   . Sleep  apnea    has used BiPAP in past; last sleep study >10 years    Patient Active Problem List   Diagnosis Date Noted  . Restless legs syndrome 05/14/2020  . Snoring 05/14/2020  . Coronary artery disease involving native heart without angina pectoris 05/13/2020  . High risk medication use 05/13/2020  . Erythrocytosis 05/13/2020  . Anxiety and depression 05/13/2020  . Hypogonadism in male 01/30/2020  . Insomnia 01/30/2020  . Prostate enlargement 01/30/2020  . Depressed mood 06/06/2019  . Severe obstructive sleep apnea 05/11/2019  . Intolerance of continuous positive airway pressure (CPAP) ventilation 05/11/2019  . Family history of premature CAD 03/20/2019  . Hyperlipidemia 03/20/2019  . Resistant hypertension 03/20/2019  . Type 2 diabetes mellitus with hyperglycemia, with long-term current use of insulin (HCC) 03/20/2019  . Screen for STD (sexually transmitted disease) 03/20/2019  . Low testosterone 03/20/2019  . Family history of melanoma 03/20/2019  . Vaccine counseling 03/20/2019  . Allergic sinusitis 03/20/2019  . Penile lesion 03/20/2019  . Ataxia 03/20/2019  . Obesity 03/20/2019  . Eustachian tube dysfunction, left 02/10/2019  . Obstructive sleep apnea syndrome 01/30/2019  . Other allergic rhinitis 01/30/2019  . 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 Thoracic or lumbosacral neuritis or radiculitis, unspecified 06/24/2012  . Degeneration of lumbar or lumbosacral intervertebral disc 06/24/2012    Past Surgical History:  Procedure Laterality Date  . COLONOSCOPY  09/2018  normal, repeat in 10 years per patient.  [place], [place]  . KNEE ARTHROSCOPY Left   . RENAL ANGIOGRAPHY N/A 07/16/2020   Procedure: RENAL ANGIOGRAPHY;  Surgeon: Yates Decamp, MD;  [facility];  Service: Cardiovascular;  Laterality: N/A;  . SACROILIAC JOINT FUSION Right 05/02/2014   Procedure: SACROILIAC JOINT FUSION;  Surgeon: Emilee Hero, MD;  Location: [facility] OR;  Service:  Orthopedics;  Laterality: Right;  Right sided sacroiliac joint fusion  . TONSILLECTOMY     uvula removed       Family History  Problem Relation Age of Onset  . Cancer Mother        lung  . Cancer Father        prostate, mets to bone, lung  . Cancer Brother        melanoma  . Heart disease Maternal Grandmother        MI  . Lung disease Maternal Grandfather        black lung  . Heart disease Paternal Grandmother   . Stroke Paternal Grandmother   . Heart disease Paternal Grandfather   . Heart disease Brother 59       MI  . Diabetes Brother     Social History   Tobacco Use  . Smoking status: Former Smoker    Packs/day: 1.00    Years: 10.00    Pack years: 10.00    Quit date: 04/20/1992    Years since quitting: 28.6  . Smokeless tobacco: Never Used  Vaping Use  . Vaping Use: Never used  Substance Use Topics  . Alcohol use: No  . Drug use: No    Home Medications Prior to Admission medications   Medication Sig Start Date End Date Taking? Authorizing Provider  amLODipine (NORVASC) 10 MG tablet TAKE 1 TABLET(10 MG) BY MOUTH DAILY 10/21/20  Yes Tolia, Sunit, DO  atorvastatin (LIPITOR) 80 MG tablet TAKE 1 TABLET(80 MG) BY MOUTH AT BEDTIME 09/05/20  Yes Tolia, Sunit, DO  carvedilol (COREG) 12.5 MG tablet TAKE 1 TABLET BY MOUTH TWICE DAILY. HOLD IF BLOOD PRESSURE LESS THAN 10/18/20  Yes Tolia, Sunit, DO  cetirizine (ZYRTEC) 10 MG tablet Take 1 tablet (10 mg total) by mouth daily. 06/28/20  Yes Henson, Vickie L, NP-C  Dulaglutide (TRULICITY) 0.75 MG/0.5ML SOPN Inject 0.75 mg into the skin once a week. 11/15/20  Yes Shamleffer, Konrad Dolores, MD  empagliflozin (JARDIANCE) 25 MG TABS tablet Take 25 mg by mouth daily. 02/29/20  Yes Tysinger, Kermit Balo, PA-C  hydrALAZINE (APRESOLINE) 25 MG tablet TAKE 1 TABLET(25 MG) BY MOUTH THREE TIMES DAILY 08/12/20  Yes Yates Decamp, MD  insulin glargine (LANTUS) 100 UNIT/ML Solostar Pen Inject 110 Units into the skin daily. 11/05/20  Yes  Tysinger, Kermit Balo, PA-C  lisinopril (ZESTRIL) 40 MG tablet TAKE 1 TABLET(40 MG) BY MOUTH DAILY 09/23/20  Yes Tolia, Sunit, DO  rOPINIRole (REQUIP) 0.25 MG tablet TAKE 1 TABLET(0.25 MG) BY MOUTH AT BEDTIME 08/15/20  Yes Dohmeier, Porfirio Mylar, MD  tamsulosin (FLOMAX) 0.4 MG CAPS capsule Take 1 capsule (0.4 mg total) by mouth daily. 02/29/20  Yes Tysinger, Kermit Balo, PA-C  TRINTELLIX 20 MG TABS tablet TAKE 1 TABLET(20 MG) BY MOUTH DAILY Patient taking differently: Take 20 mg by mouth daily. 12/04/20  Yes Tysinger, Kermit Balo, PA-C  verapamil (CALAN) 120 MG tablet Take 1 tablet (120 mg total) by mouth once for 1 dose. Patient taking differently: Take 120 mg by mouth daily. 11/20/20 11/20/20 Yes Tolia, Sunit, DO  fluticasone (FLONASE) 50 MCG/ACT nasal spray Place 2 sprays into both nostrils daily. 03/06/20   [provider]  Insulin Pen Needle (BD PEN NEEDLE NANO U/F) 32G X 4 MM MISC 1 each by Does not apply route at bedtime. 02/29/20   Tysinger, Kermit Balo, PA-C  Needles & Syringes MISC As directed 90ml of testosterone every 14 days 02/29/20   Tysinger, Kermit Balo, PA-C  testosterone cypionate (DEPOTESTOSTERONE CYPIONATE) 200 MG/ML injection Inject 1 mL (200 mg total) into the muscle every 14 (fourteen) days. Patient not taking: Reported on 12/20/2020 10/31/20   Tysinger, Kermit Balo, PA-C  verapamil (CALAN-SR) 180 MG CR tablet TAKE 1 TABLET BY MOUTH DAILY Patient not taking: Reported on 12/20/2020 11/19/20   Tysinger, Kermit Balo, PA-C    Allergies    Pravastatin, Trazodone and nefazodone, Hydrochlorothiazide, and Metformin and related  Review of Systems   Review of Systems  Constitutional: Negative for chills and fever.  HENT: Negative for sore throat.   Respiratory: Positive for cough. Negative for shortness of breath.   Cardiovascular: Negative for chest pain.  Gastrointestinal: Positive for abdominal pain, diarrhea, nausea and vomiting. Negative for constipation.  Genitourinary: Negative for flank pain.   Musculoskeletal: Negative for back pain.  Neurological: Negative for light-headedness and headaches.  All other systems reviewed and are negative.   Physical Exam Updated Vital Signs BP 110/75   Pulse 81   Temp 98.4 F (36.9 C) (Oral)   Resp 17   Ht 5\' 10"  (1.778 m)   Wt 99.8 kg   SpO2 90%   BMI 31.57 kg/m   Physical Exam Vitals and nursing note reviewed.  Constitutional:      Appearance: Normal appearance. He is obese. He is not ill-appearing.  HENT:     Head: Normocephalic and atraumatic.     Nose: Nose normal.  Cardiovascular:     Pulses: Normal pulses.  Pulmonary:     Effort: Pulmonary effort is normal.     Breath sounds: No wheezing.  Abdominal:     General: Abdomen is flat.     Palpations: Abdomen is soft.     Tenderness: There is abdominal tenderness in the right upper quadrant. There is no right CVA tenderness or left CVA tenderness.     Hernia: No hernia is present.       Comments: Bowel sounds are diminished, TTP along the RUQ.   Musculoskeletal:     Cervical back: Normal range of motion and neck supple.  Skin:    General: Skin is warm and dry.  Neurological:     Mental Status: He is alert and oriented to person, place, and time.     ED Results / Procedures / Treatments   Labs (all labs ordered are listed, but only abnormal results are displayed) Labs Reviewed  COMPREHENSIVE METABOLIC PANEL - Abnormal; Notable for the following components:      Result Value   CO2 21 (*)    Glucose, Bld 152 (*)    BUN 23 (*)    Creatinine, Ser 1.26 (*)    All other components within normal limits  CBC - Abnormal; Notable for the following components:   WBC 12.7 (*)    RBC 5.90 (*)    Hemoglobin 17.8 (*)    HCT 54.7 (*)    All other components within normal limits  RESP PANEL BY RT-PCR (FLU A&B, COVID) ARPGX2  LIPASE, BLOOD  URINALYSIS, ROUTINE W REFLEX MICROSCOPIC    EKG None  Radiology No results  found.  Procedures Procedures (including critical  care time)  Medications Ordered in ED Medications  sodium chloride 0.9 % bolus 1,000 mL (0 mLs Intravenous Stopped 12/20/20 1100)    ED Course  I have reviewed the triage vital signs and the nursing notes.  Pertinent labs & imaging results that were available during my care of the patient were reviewed by me and considered in my medical decision making (see chart for details).    MDM Rules/Calculators/A&P     Patient with a past medical history of IBS, CAD presents to the ED with a chief complaint of abdominal pain, nausea, diarrhea, vomiting.  Patient reports symptoms began after eating a chicken fillet supper yesterday.  Symptoms have been alleviated with vomiting, had 4 episodes prior to arrival in the ED.  Multiple episodes of diarrhea none with any blood involved.  Has not been running any fevers.  No recent sick contacts.  Attempted to take Imodium in order to help with symptoms however reports he most likely vomited this medication.  During physical exam, patient is non-ill-appearing, abdomen is soft, mild tender to palpation along the right upper quadrant.  No prior surgical history to his abdomen.  Has not been running any fevers.  Is are clear to auscultation, he denies any URI symptoms on today's visit.  No chest pain or shortness of breath.  He is not having any urinary symptoms.  Interpretation of his labs by me reveal a CBC with a mild leukocytosis, suspect likely due to multiple emesis episodes.  CMP without any electrolyte abnormality, creatinine level slightly elevated, received 1 L of fluids while in the ED.  He does report improvement in his symptoms after last episode of emesis while in the ED.  Has not had any diarrhea episodes since he has been back in his room.  We discussed obtaining imaging to further evaluate pain, however he does report feeling overall well and not in much discomfort at all.  We discussed likely been suspicious food intake seeing his symptoms began  after that.  He is advised to return if symptoms worsen, we discussed avoiding antimotility medication as if this is due to infectious source will need to avoid this.  He is concerned about COVID-19 infection, I do not suspect this at this time however testing was offered for patient and will be obtained on today's visit.  Oxygen saturation arrival was 98%, he continues to have no shortness of breath, no chest pain, no URI symptoms, or myalgias.  12:42 PM we discussed testing for COVID-19, patient will be discharged pending results.  Return precautions discussed at length.  Patient stable for discharge.   Portions of this note were generated with Scientist, clinical (histocompatibility and immunogenetics). Dictation errors may occur despite best attempts at proofreading.  Final Clinical Impression(s) / ED Diagnoses Final diagnoses:  Nausea vomiting and diarrhea    Rx / DC Orders ED Discharge Orders    None       Claude Manges, PA-C 12/20/20 1243    Linwood Dibbles, MD 12/21/20 [phone]

## 2020-12-28 NOTE — Progress Notes (Signed)
Cardiology Office Note:    Date:  01/01/2021   ID:  [name] [name], DOB 1961-08-04, MRN [id number]  PCP:  Carlena Hurl, PA-C  Cardiologist:  No primary care provider on file.   Referring MD: Carlena Hurl, PA-C   Chief Complaint  Patient presents with  . Coronary Artery Disease  . Hypertension    History of Present Illness:    [name] [name] is a 60 y.o. male with a hx of non-obstructive CAD, resistant primary hypertension, DM II, hyperlipidemia, OSA, DM 2, and recent renal angiography with no abnormality found. Transferring care from Dr. Terri Skains / [facility]  He is switching over from Dr. Terri Skains.  He tells me I take care of his mother years ago.  He has diabetes , has untreated sleep apnea, and asymptomatic nonobstructive CAD.  Blood pressure has been a significant issue and is currently on 6 agents that include amlodipine, verapamil, carvedilol, Apresoline, Zestril and no diuretic.  He does take Jardiance which has some mild diuretic effect.  He is interested in being on fewer medications.  Tried hydrochlorothiazide in the past but it caused him to feel washed out.  Past Medical History:  Diagnosis Date  . Anxiety   . Coronary artery disease   . Depression   . Diabetes mellitus    Type 2  . Enlarged prostate   . Environmental allergies   . Environmental and seasonal allergies   . GERD (gastroesophageal reflux disease)   . Headache(784.0)    sinus and migraines  . Hyperlipidemia   . Hypertension   . IBS (irritable bowel syndrome)   . Low testosterone   . Renal artery stenosis ([place])   . Sleep apnea    has used BiPAP in past; last sleep study >10 years    Past Surgical History:  Procedure Laterality Date  . COLONOSCOPY  09/2018   normal, repeat in 10 years per patient.  [place], [place]  . KNEE ARTHROSCOPY Left   . RENAL ANGIOGRAPHY N/A 07/16/2020   Procedure: RENAL ANGIOGRAPHY;  Surgeon: Adrian Prows, MD;  Location: [facility];  Service:  Cardiovascular;  Laterality: N/A;  . SACROILIAC JOINT FUSION Right 05/02/2014   Procedure: SACROILIAC JOINT FUSION;  Surgeon: Sinclair Ship, MD;  Location: [place] [facility];  Service: Orthopedics;  Laterality: Right;  Right sided sacroiliac joint fusion  . TONSILLECTOMY     uvula removed    Current Medications: Current Meds  Medication Sig  . amLODipine (NORVASC) 10 MG tablet TAKE 1 TABLET(10 MG) BY MOUTH DAILY  . atorvastatin (LIPITOR) 80 MG tablet TAKE 1 TABLET(80 MG) BY MOUTH AT BEDTIME  . carvedilol (COREG) 12.5 MG tablet TAKE 1 TABLET BY MOUTH TWICE DAILY. HOLD IF BLOOD PRESSURE LESS THAN 100MMHG  . cetirizine (ZYRTEC) 10 MG tablet Take 1 tablet (10 mg total) by mouth daily.  . Dulaglutide (TRULICITY) 0.62 IR/4.8NI SOPN Inject 0.75 mg into the skin once a week.  . empagliflozin (JARDIANCE) 25 MG TABS tablet Take 25 mg by mouth daily.  . fluticasone (FLONASE) 50 MCG/ACT nasal spray Place 2 sprays into both nostrils daily.  . hydrALAZINE (APRESOLINE) 25 MG tablet TAKE 1 TABLET(25 MG) BY MOUTH THREE TIMES DAILY  . insulin glargine (LANTUS) 100 UNIT/ML Solostar Pen Inject 110 Units into the skin daily.  . Insulin Pen Needle (BD PEN NEEDLE NANO U/F) 32G X 4 MM MISC 1 each by Does not apply route at bedtime.  Marland Kitchen lisinopril (ZESTRIL) 40 MG tablet TAKE 1 TABLET(40 MG)  BY MOUTH DAILY  . Needles & Syringes MISC As directed 53ml of testosterone every 14 days  . rOPINIRole (REQUIP) 0.25 MG tablet TAKE 1 TABLET(0.25 MG) BY MOUTH AT BEDTIME  . tamsulosin (FLOMAX) 0.4 MG CAPS capsule Take 1 capsule (0.4 mg total) by mouth daily.  Marland Kitchen testosterone cypionate (DEPOTESTOSTERONE CYPIONATE) 200 MG/ML injection Inject 1 mL (200 mg total) into the muscle every 14 (fourteen) days.  . TRINTELLIX 20 MG TABS tablet TAKE 1 TABLET(20 MG) BY MOUTH DAILY  . [DISCONTINUED] verapamil (CALAN) 120 MG tablet Take 1 tablet (120 mg total) by mouth once for 1 dose.     Allergies:   Pravastatin, Trazodone and nefazodone,  Hydrochlorothiazide, and Metformin and related   Social History   Socioeconomic History  . Marital status: Single    Spouse name: Not on file  . Number of children: Not on file  . Years of education: Not on file  . Highest education level: Not on file  Occupational History  . Not on file  Tobacco Use  . Smoking status: Former Smoker    Packs/day: 1.00    Years: 10.00    Pack years: 10.00    Quit date: 04/20/1992    Years since quitting: 28.7  . Smokeless tobacco: Never Used  Vaping Use  . Vaping Use: Never used  Substance and Sexual Activity  . Alcohol use: No  . Drug use: No  . Sexual activity: Not on file  Other Topics Concern  . Not on file  Social History Narrative  . Not on file   Social Determinants of Health   Financial Resource Strain: Not on file  Food Insecurity: Not on file  Transportation Needs: Not on file  Physical Activity: Not on file  Stress: Not on file  Social Connections: Not on file     Family History: The patient's family history includes Cancer in his brother, father, and mother; Diabetes in his brother; Heart disease in his maternal grandmother, paternal grandfather, and paternal grandmother; Heart disease (age of onset: 20) in his brother; Lung disease in his maternal grandfather; Stroke in his paternal grandmother.  ROS:   Please see the history of present illness.    Says he does not have sleep apnea is not on treatment.  Had negative renal angiogram.  All other systems reviewed and are negative.  EKGs/Labs/Other Studies Reviewed:    The following studies were reviewed today:  CORONARY CT ANGIO 03/2020: IMPRESSION: 1. Calcium score 95 which is 76 th percentile for age and sex  2.  Normal aortic root 2.9 cm  3.  CAD CAD RADS 3 most significant in LAD and diagonal  Study sent for FFR CT FINDINGS: No FFR CT findings < 0.80  RCA 0.99  Circumflex 0.90  LAD 0.94 proximally 0.88 mid vessel 0.84 distally  D1:  0.89  IMPRESSION: FFR CT does not suggest hemodynamically significant lesions in LAD/Diagonal see above. Medical Rx and aggressive risk factor modification  With close cardiology f/u indicated   EKG:  EKG not repeated  Recent Labs: 12/20/2020: ALT 32; BUN 23; Creatinine, Ser 1.26; Hemoglobin 17.8; Platelets 275; Potassium 4.0; Sodium 139  Recent Lipid Panel    Component Value Date/Time   CHOL 126 07/05/2020 0922   TRIG 100 07/05/2020 0922   HDL 29 (L) 07/05/2020 0922   CHOLHDL 4.4 05/13/2020 0959   CHOLHDL 6.6 Ratio 06/17/2010 2242   VLDL 60 (H) 06/17/2010 2242   LDLCALC 78 07/05/2020 0922   LDLDIRECT 93 08/20/2009  2019    Physical Exam:    VS:  BP 100/68   Pulse 71   Ht 5\' 10"  (1.778 m)   Wt 228 lb 3.2 oz (103.5 kg)   SpO2 92%   BMI 32.74 kg/m     Wt Readings from Last 3 Encounters:  01/01/21 228 lb 3.2 oz (103.5 kg)  12/20/20 220 lb (99.8 kg)  11/13/20 229 lb 6 oz (104 kg)     GEN: Overweight. No acute distress HEENT: Normal NECK: No JVD. LYMPHATICS: No lymphadenopathy CARDIAC:  murmur. RRR no gallop, or edema. VASCULAR:  Normal Pulses. No bruits. RESPIRATORY:  Clear to auscultation without rales, wheezing or rhonchi  ABDOMEN: Soft, non-tender, non-distended, No pulsatile mass, MUSCULOSKELETAL: No deformity  SKIN: Warm and dry NEUROLOGIC:  Alert and oriented x 3 PSYCHIATRIC:  Normal affect   ASSESSMENT:    1. Nonobstructive atherosclerosis of coronary artery   2. Type 2 diabetes mellitus with stage 2 chronic kidney disease, with long-term current use of insulin (HCC)   3. Mixed hyperlipidemia   4. Essential hypertension, benign   5. Obstructive sleep apnea syndrome   6. Educated about COVID-19 virus infection    PLAN:    In order of problems listed above:  1. No symptoms.  Secondary prevention discussed. 2. Target LDL less than 70.  Outstanding that he is on Jardiance. 3. LDL target should be less than 70. 4. Blood pressure is overtreated.   He is on 6 or 7 different agents.  Because of redundancy with verapamil and amlodipine, will discontinue verapamil.  He will continue to monitor pressures at home.  Neck step would be to attempt to discontinue hydralazine.  Neck step will be to reinstitute low-dose HCTZ.  This may allow 11/15/20 to further decrease intensity of therapy.  Although our target blood pressure will be less than 130/80 mmHg.  Discussed the Dash diet. 5. Need to consider treatment if blood pressure remains resistant.\ 6. Vaccinated and practicing medication.   Medication Adjustments/Labs and Tests Ordered: Current medicines are reviewed at length with the patient today.  Concerns regarding medicines are outlined above.  No orders of the defined types were placed in this encounter.  No orders of the defined types were placed in this encounter.   Patient Instructions  Medication Instructions:  1) DISCONTINUE Verapamil.  Monitor your blood pressure for 2 weeks and call the office or send a MyChart message with those readings.  *If you need a refill on your cardiac medications before your next appointment, please call your pharmacy*   Lab Work: None If you have labs (blood work) drawn today and your tests are completely normal, you will receive your results only by: [place] MyChart Message (if you have MyChart) OR . A paper copy in the mail If you have any lab test that is abnormal or we need to change your treatment, we will call you to review the results.   Testing/Procedures: None   Follow-Up: At [facility], you and your health needs are our priority.  As part of our continuing mission to provide you with exceptional heart care, we have created designated Provider Care Teams.  These Care Teams include your primary Cardiologist (physician) and Advanced Practice Providers (APPs -  Physician Assistants and Nurse Practitioners) who all work together to provide you with the care you need, when you need it.  We recommend  signing up for the patient portal called "MyChart".  Sign up information is provided on this After Visit Summary.  MyChart is used to connect with patients for Virtual Visits (Telemedicine).  Patients are able to view lab/test results, encounter notes, upcoming appointments, etc.  Non-urgent messages can be sent to your provider as well.   To learn more about what you can do with MyChart, go to [email or web address].    Your next appointment:   6 month(s)  The format for your next appointment:   In Person  Provider:   You may see Dr. Verdis Prime or one of the following Advanced Practice Providers on your designated Care Team:    Norma Fredrickson, NP  Nada Boozer, NP  Georgie Chard, NP    Other Instructions      Signed, Lesleigh Noe, MD  01/01/2021 12:17 PM    [facility]

## 2021-01-01 ENCOUNTER — Other Ambulatory Visit: Payer: Self-pay

## 2021-01-01 ENCOUNTER — Encounter: Payer: Self-pay | Admitting: Interventional Cardiology

## 2021-01-01 ENCOUNTER — Ambulatory Visit: Payer: BC Managed Care – PPO | Admitting: Interventional Cardiology

## 2021-01-01 VITALS — BP 100/68 | HR 71 | Ht 70.0 in | Wt 228.2 lb

## 2021-01-01 DIAGNOSIS — I2510 Atherosclerotic heart disease of native coronary artery without angina pectoris: Secondary | ICD-10-CM

## 2021-01-01 DIAGNOSIS — Z794 Long term (current) use of insulin: Secondary | ICD-10-CM

## 2021-01-01 DIAGNOSIS — E1122 Type 2 diabetes mellitus with diabetic chronic kidney disease: Secondary | ICD-10-CM | POA: Diagnosis not present

## 2021-01-01 DIAGNOSIS — Z7189 Other specified counseling: Secondary | ICD-10-CM

## 2021-01-01 DIAGNOSIS — I10 Essential (primary) hypertension: Secondary | ICD-10-CM | POA: Diagnosis not present

## 2021-01-01 DIAGNOSIS — N182 Chronic kidney disease, stage 2 (mild): Secondary | ICD-10-CM

## 2021-01-01 DIAGNOSIS — G4733 Obstructive sleep apnea (adult) (pediatric): Secondary | ICD-10-CM

## 2021-01-01 DIAGNOSIS — E782 Mixed hyperlipidemia: Secondary | ICD-10-CM

## 2021-01-01 NOTE — Patient Instructions (Signed)
Medication Instructions:  1) DISCONTINUE Verapamil.  Monitor your blood pressure for 2 weeks and call the office or send a MyChart message with those readings.  *If you need a refill on your cardiac medications before your next appointment, please call your pharmacy*   Lab Work: None If you have labs (blood work) drawn today and your tests are completely normal, you will receive your results only by: Marland Kitchen MyChart Message (if you have MyChart) OR . A paper copy in the mail If you have any lab test that is abnormal or we need to change your treatment, we will call you to review the results.   Testing/Procedures: None   Follow-Up: At [facility], you and your health needs are our priority.  As part of our continuing mission to provide you with exceptional heart care, we have created designated Provider Care Teams.  These Care Teams include your primary Cardiologist (physician) and Advanced Practice Providers (APPs -  Physician Assistants and Nurse Practitioners) who all work together to provide you with the care you need, when you need it.  We recommend signing up for the patient portal called "MyChart".  Sign up information is provided on this After Visit Summary.  MyChart is used to connect with patients for Virtual Visits (Telemedicine).  Patients are able to view lab/test results, encounter notes, upcoming appointments, etc.  Non-urgent messages can be sent to your provider as well.   To learn more about what you can do with MyChart, go to [email or web address].    Your next appointment:   6 month(s)  The format for your next appointment:   In Person  Provider:   You may see Dr. Verdis Prime or one of the following Advanced Practice Providers on your designated Care Team:    Norma Fredrickson, NP  Nada Boozer, NP  Georgie Chard, NP    Other Instructions

## 2021-01-06 ENCOUNTER — Telehealth: Payer: Self-pay

## 2021-01-06 NOTE — Telephone Encounter (Signed)
Patient stated he is on his last pen for lantus and will need a prior auth per his insurance. Can we go ahead and start the prior auth?

## 2021-01-07 ENCOUNTER — Telehealth: Payer: Self-pay | Admitting: Medical

## 2021-01-07 ENCOUNTER — Telehealth: Payer: Self-pay

## 2021-01-07 NOTE — Telephone Encounter (Signed)
P.A. LANTUS unable to complete pt's insurance invalid.  Called pt left message need new insurance in order to complete P.A.

## 2021-01-07 NOTE — Telephone Encounter (Signed)
Pt called about his prior aut and states that laura had called wanting insurance information, I informed him that she was gone for the afternoon, pt was not happy that she left stated she should have not called wanting information then left, states he insurance has not changed, pt was very rude. York Spaniel he was at work and did not have time for this call, he did not want to leave VM, only wanted me to tell her about the insurance

## 2021-01-08 NOTE — Telephone Encounter (Signed)
Tiffany with [employer] called, she received call from member that we were having trouble verifying coverage. She states patient has active coverage Same policy # we have [id number] Group # [id number] Rx BIN [id number] RxPCN [id number]  Policy went into effect on 01/29/2020 and has no term date

## 2021-01-09 ENCOUNTER — Encounter: Payer: Self-pay | Admitting: Family Medicine

## 2021-01-09 NOTE — Telephone Encounter (Signed)
Dismissal letter sent.

## 2021-01-09 NOTE — Telephone Encounter (Signed)
Tried again to complete with Cover my meds still shows not eligible.  Called [employer] t# [phone] was unable to get representative on phone.  Called T# [phone] was able to get thru,   Semglee is preferred medication and cost is $35 for 30 days and $70 for 90 days.  She is faxing coverage exception form.

## 2021-01-10 NOTE — Telephone Encounter (Signed)
P.A. completed and faxed, pt ws given sample Lantus to hold while waiting on P.A.

## 2021-01-15 ENCOUNTER — Other Ambulatory Visit: Payer: Self-pay | Admitting: Medical

## 2021-01-15 MED ORDER — INSULIN GLARGINE 100 UNIT/ML ~~LOC~~ SOLN: 110.0000 [IU] | Freq: Every day | SUBCUTANEOUS | 0 refills | Status: DC

## 2021-01-15 NOTE — Telephone Encounter (Signed)
Called [employer] & they had no record of the P.A. or the case being filed so I faxed again to t# [phone] with Urgent request.  Called pt back & he is ok with switching to preferred medication Semglee pen injector because it is probably not going to be approved per the representative since he has never tried this new medication.  Cost will be for 90 days for $70.   Can you switch and send in for 90 days as pt is out of his medication

## 2021-01-16 NOTE — Telephone Encounter (Signed)
Pt called & states [facility] system is down and asked that Rx be switched to [facility] & I called & switched.  They will have to order the medication.  Called pt and informed

## 2021-01-18 ENCOUNTER — Other Ambulatory Visit: Payer: Self-pay | Admitting: Cardiology

## 2021-01-18 DIAGNOSIS — E1122 Type 2 diabetes mellitus with diabetic chronic kidney disease: Secondary | ICD-10-CM

## 2021-01-18 NOTE — Telephone Encounter (Signed)
P.A. was  Indeed denied, pt needed trial of Semglee which pt was switched to

## 2021-01-28 ENCOUNTER — Other Ambulatory Visit: Payer: Self-pay | Admitting: *Deleted

## 2021-01-28 ENCOUNTER — Telehealth: Payer: Self-pay | Admitting: Internal Medicine

## 2021-01-28 DIAGNOSIS — H1032 Unspecified acute conjunctivitis, left eye: Secondary | ICD-10-CM | POA: Diagnosis not present

## 2021-01-28 MED ORDER — TRULICITY 0.75 MG/0.5ML ~~LOC~~ SOAJ: 0.7500 mg | SUBCUTANEOUS | 0 refills | Status: DC

## 2021-01-28 NOTE — Telephone Encounter (Signed)
Rx sent 

## 2021-01-28 NOTE — Telephone Encounter (Signed)
Pt requests a refill on Trulicity. Pt says he just needs enough to last him till his appt.  PHARMACY: [id number] [place], [place] - [address] AT [facility] [address] [facility] [address] Phone:  [phone]  Fax:  [phone]

## 2021-01-29 ENCOUNTER — Other Ambulatory Visit: Payer: Self-pay | Admitting: Neurology

## 2021-01-29 ENCOUNTER — Telehealth: Payer: Self-pay | Admitting: Neurology

## 2021-01-29 MED ORDER — ROPINIROLE HCL 0.25 MG PO TABS: ORAL_TABLET | ORAL | 0 refills | Status: DC

## 2021-01-29 NOTE — Telephone Encounter (Signed)
Pt  Has called re: his rOPINIRole (REQUIP) 0.25 MG.  Pt was informed his 6 month f/u needs scheduling.  Pt began cursing (saying this is b-s) and that [facility] just wants his money.  Pt states he does not have money just to come in here and say that he still has RLS.  Pt was asked again if he was willing to schedule, pt cursed again calling the policy B-S.  The call then ended, this is FYI for RN, no call back requested.

## 2021-01-29 NOTE — Telephone Encounter (Signed)
I have sent a 42-month supply to the pharmacy previously used for his refill.  It is [facility] policy that we follow up with the patient at least once a year for medication management unless it is a controlled substance which requires more visits.  In his case, once a year will suffice.  Patient must schedule a follow-up visit around May 2022 or he can discuss his primary care taking over the medication moving forward if he is stable.

## 2021-02-04 ENCOUNTER — Other Ambulatory Visit: Payer: Self-pay | Admitting: Cardiology

## 2021-02-04 DIAGNOSIS — E1122 Type 2 diabetes mellitus with diabetic chronic kidney disease: Secondary | ICD-10-CM

## 2021-02-04 DIAGNOSIS — I129 Hypertensive chronic kidney disease with stage 1 through stage 4 chronic kidney disease, or unspecified chronic kidney disease: Secondary | ICD-10-CM

## 2021-02-12 ENCOUNTER — Telehealth: Payer: Self-pay | Admitting: Interventional Cardiology

## 2021-02-12 DIAGNOSIS — E1122 Type 2 diabetes mellitus with diabetic chronic kidney disease: Secondary | ICD-10-CM

## 2021-02-12 DIAGNOSIS — E119 Type 2 diabetes mellitus without complications: Secondary | ICD-10-CM | POA: Diagnosis not present

## 2021-02-12 MED ORDER — ATORVASTATIN CALCIUM 80 MG PO TABS: ORAL_TABLET | ORAL | 3 refills | Status: DC

## 2021-02-12 MED ORDER — CARVEDILOL 12.5 MG PO TABS: ORAL_TABLET | ORAL | 3 refills | Status: DC

## 2021-02-12 MED ORDER — HYDRALAZINE HCL 25 MG PO TABS: ORAL_TABLET | ORAL | 3 refills | Status: DC

## 2021-02-12 MED ORDER — LISINOPRIL 40 MG PO TABS: ORAL_TABLET | ORAL | 3 refills | Status: DC

## 2021-02-12 MED ORDER — AMLODIPINE BESYLATE 10 MG PO TABS: ORAL_TABLET | ORAL | 3 refills | Status: DC

## 2021-02-12 NOTE — Telephone Encounter (Signed)
Pt's medication was sent to pt's pharmacy as requested. Confirmation received.  °

## 2021-02-12 NOTE — Telephone Encounter (Signed)
*  STAT* If patient is at the pharmacy, call can be transferred to refill team.   1. Which medications need to be refilled? (please list name of each medication and dose if known)  amLODipine (NORVASC) 10 MG tablet; atorvastatin (LIPITOR) 80 MG tablet; carvedilol (COREG) 12.5 MG tablet; hydrALAZINE (APRESOLINE) 25 MG tablet; lisinopril (ZESTRIL) 40 MG tablet  2. Which pharmacy/location (including street and city if local pharmacy) is medication to be sent to? [id number] - [place], [place] - [address] AT [address]  3. Do they need a 30 day or 90 day supply? 90 for all

## 2021-02-19 ENCOUNTER — Ambulatory Visit: Payer: BC Managed Care – PPO | Admitting: Cardiology

## 2021-02-19 ENCOUNTER — Ambulatory Visit: Payer: BC Managed Care – PPO | Admitting: Internal Medicine

## 2021-02-20 ENCOUNTER — Other Ambulatory Visit: Payer: Self-pay | Admitting: Internal Medicine

## 2021-03-05 ENCOUNTER — Other Ambulatory Visit: Payer: Self-pay

## 2021-03-06 ENCOUNTER — Ambulatory Visit: Payer: BC Managed Care – PPO | Admitting: Internal Medicine

## 2021-03-06 ENCOUNTER — Encounter: Payer: Self-pay | Admitting: Internal Medicine

## 2021-03-06 VITALS — BP 122/66 | HR 72 | Ht 70.0 in | Wt 227.0 lb

## 2021-03-06 DIAGNOSIS — E1165 Type 2 diabetes mellitus with hyperglycemia: Secondary | ICD-10-CM | POA: Diagnosis not present

## 2021-03-06 DIAGNOSIS — Z794 Long term (current) use of insulin: Secondary | ICD-10-CM | POA: Diagnosis not present

## 2021-03-06 LAB — POCT GLYCOSYLATED HEMOGLOBIN (HGB A1C): Hemoglobin A1C: 8 % — AB (ref 4.0–5.6)

## 2021-03-06 MED ORDER — EMPAGLIFLOZIN 25 MG PO TABS: 25.0000 mg | ORAL_TABLET | Freq: Every day | ORAL | 3 refills | Status: DC

## 2021-03-06 MED ORDER — TOUJEO MAX SOLOSTAR 300 UNIT/ML ~~LOC~~ SOPN: 110.0000 [IU] | PEN_INJECTOR | Freq: Every day | SUBCUTANEOUS | 3 refills | Status: DC

## 2021-03-06 MED ORDER — TRULICITY 1.5 MG/0.5ML ~~LOC~~ SOAJ: 1.5000 mg | SUBCUTANEOUS | 3 refills | Status: DC

## 2021-03-06 MED ORDER — INSULIN GLARGINE 100 UNIT/ML ~~LOC~~ SOLN: 110.0000 [IU] | Freq: Every day | SUBCUTANEOUS | 3 refills | Status: DC

## 2021-03-06 NOTE — Progress Notes (Signed)
Name: [name] [name]  Age/ Sex: 60 y.o., male   MRN/ DOB: [id number], May 10, 1961     PCP: No primary care provider on file.   Reason for Endocrinology Evaluation: Type 2 Diabetes Mellitus  Initial Endocrine Consultative Visit: 11/13/2020    PATIENT IDENTIFIER: [name] [name] is a 60 y.o. male with a past medical history of T2DM, CAD. The patient has followed with Endocrinology clinic since 11/13/2020 for consultative assistance with management of his diabetes.  DIABETIC HISTORY:  [name] [name] was diagnosed with DM in 2008, Metformin- diarrhea , glipizide - hypoglycemia. His hemoglobin A1c has ranged from 8.4% in 2021, peaking at 10.5% in 2020.   SUBJECTIVE:   During the last visit (11/13/2020): A1c 9.7% Started Trulicity      Today (03/06/2021): [name] [name]  [name] [name] checks his blood sugars 2-3 times daily. The patient has not had hypoglycemic episodes since the last clinic visit.    Ed visit 11/2020 for food poisoning  Denies nausea or diarrhea recently    HOME DIABETES REGIMEN:  -  Lantus  110 units daily - some days [name] [name] takes 130 units  -  Trulicity 0.75 weekly  -  Jardiance 25 mg daily     Statin: yes ACE-I/ARB: yes Prior Diabetic Education: yes   METER DOWNLOAD SUMMARY: Unable to download  116- 406 mg/dL     DIABETIC COMPLICATIONS: Microvascular complications:   Denies: CKD, retinopathy, neuropathy Last Eye Exam: Completed 03/2020  Macrovascular complications:  CAD Denies:  CVA, PVD   HISTORY:  Past Medical History:  Past Medical History:  Diagnosis Date   Anxiety    Coronary artery disease    Depression    Diabetes mellitus    Type 2   Enlarged prostate    Environmental allergies    Environmental and seasonal allergies    GERD (gastroesophageal reflux disease)    Headache(784.0)    sinus and migraines   Hyperlipidemia    Hypertension    IBS (irritable bowel syndrome)    Low testosterone    Renal artery stenosis (HCC)     Sleep apnea    has used BiPAP in past; last sleep study >10 years   Past Surgical History:  Past Surgical History:  Procedure Laterality Date   COLONOSCOPY  09/2018   normal, repeat in 10 years per patient.  [place], [place]   KNEE ARTHROSCOPY Left    RENAL ANGIOGRAPHY N/A 07/16/2020   Procedure: RENAL ANGIOGRAPHY;  Surgeon: Yates Decamp, MD;  Location: [facility];  Service: Cardiovascular;  Laterality: N/A;   SACROILIAC JOINT FUSION Right 05/02/2014   Procedure: SACROILIAC JOINT FUSION;  Surgeon: Emilee Hero, MD;  Location: [facility] OR;  Service: Orthopedics;  Laterality: Right;  Right sided sacroiliac joint fusion   TONSILLECTOMY     uvula removed   Social History:  reports that [name] [name] quit smoking about 28 years ago. [name] [name] has a 10.00 pack-year smoking history. [name] [name] has never used smokeless tobacco. [name] [name] reports that [name] [name] does not drink alcohol and does not use drugs. Family History:  Family History  Problem Relation Age of Onset   Cancer Mother        lung   Cancer Father        prostate, mets to bone, lung   Cancer Brother        melanoma   Heart disease Maternal Grandmother        MI   Lung disease Maternal Grandfather        black lung  Heart disease Paternal Grandmother    Stroke Paternal Grandmother    Heart disease Paternal Grandfather    Heart disease Brother 45       MI   Diabetes Brother      HOME MEDICATIONS: Allergies as of 03/06/2021      Reactions   Pravastatin Other (See Comments)   Joint myalgias   Trazodone And Nefazodone    Had restless legs   Hydrochlorothiazide Other (See Comments)   lightheadedness   Metformin And Related Diarrhea      Medication List       Accurate as of March 06, 2021  7:35 AM. If you have any questions, ask your nurse or doctor.        amLODipine 10 MG tablet Commonly known as: NORVASC TAKE 1 TABLET(10 MG) BY MOUTH DAILY   atorvastatin 80 MG tablet Commonly known as: LIPITOR TAKE 1 TABLET(80 MG) BY MOUTH  AT BEDTIME   BD Pen Needle Nano U/F 32G X 4 MM Misc Generic drug: Insulin Pen Needle 1 each by Does not apply route at bedtime.   carvedilol 12.5 MG tablet Commonly known as: COREG TAKE 1 TABLET BY MOUTH TWICE DAILY. HOLD IF BLOOD PRESSURE LESS THAN   cetirizine 10 MG tablet Commonly known as: ZYRTEC Take 1 tablet (10 mg total) by mouth daily.   fluticasone 50 MCG/ACT nasal spray Commonly known as: FLONASE Place 2 sprays into both nostrils daily.   hydrALAZINE 25 MG tablet Commonly known as: APRESOLINE TAKE 1 TABLET(25 MG) BY MOUTH THREE TIMES DAILY   insulin glargine 100 UNIT/ML injection Commonly known as: Semglee Inject 1.1 mLs (110 Units total) into the skin daily.   Jardiance 25 MG Tabs tablet Generic drug: empagliflozin Take 25 mg by mouth daily.   lisinopril 40 MG tablet Commonly known as: ZESTRIL TAKE 1 TABLET(40 MG) BY MOUTH DAILY   Needles & Syringes Misc As directed 14ml of testosterone every 14 days   rOPINIRole 0.25 MG tablet Commonly known as: REQUIP TAKE 1 TABLET(0.25 MG) BY MOUTH AT BEDTIME   tamsulosin 0.4 MG Caps capsule Commonly known as: FLOMAX Take 1 capsule (0.4 mg total) by mouth daily.   testosterone cypionate 200 MG/ML injection Commonly known as: DEPOTESTOSTERONE CYPIONATE Inject 1 mL (200 mg total) into the muscle every 14 (fourteen) days.   Trintellix 20 MG Tabs tablet Generic drug: vortioxetine HBr TAKE 1 TABLET(20 MG) BY MOUTH DAILY   Trulicity 0.75 MG/0.5ML Sopn Generic drug: Dulaglutide ADMINISTER 0.75 MG UNDER THE SKIN 1 TIME A WEEK        OBJECTIVE:   Vital Signs: BP 122/66    Pulse 72    Ht 5\' 10"  (1.778 m)    Wt 227 lb (103 kg)    SpO2 94%    BMI 32.57 kg/m   Wt Readings from Last 3 Encounters:  01/01/21 228 lb 3.2 oz (103.5 kg)  12/20/20 220 lb (99.8 kg)  11/13/20 229 lb 6 oz (104 kg)     Exam: General: Pt appears well and is in NAD  Lungs: Clear with good BS bilat with no rales, rhonchi, or wheezes   Heart: RRR   Abdomen: Normoactive bowel sounds, soft, nontender, without masses or organomegaly palpable  Extremities: No pretibial edema.  Neuro: MS is good with appropriate affect, pt is alert and Ox3      DATA REVIEWED:  Lab Results  Component Value Date   HGBA1C 9.7 (H) 10/29/2020   HGBA1C 8.9 (H) 05/13/2020  HGBA1C 8.4 (H) 01/30/2020   Lab Results  Component Value Date   MICROALBUR 0.50 11/11/2010   LDLCALC 78 07/05/2020   CREATININE 1.26 (H) 12/20/2020   Lab Results  Component Value Date   MICRALBCREAT 5 01/30/2020     Lab Results  Component Value Date   CHOL 126 07/05/2020   HDL 29 (L) 07/05/2020   LDLCALC 78 07/05/2020   LDLDIRECT 93 08/20/2009   TRIG 100 07/05/2020   CHOLHDL 4.4 05/13/2020         ASSESSMENT / PLAN / RECOMMENDATIONS:   1) Type 2 Diabetes Mellitus, with improving glucose control , With out complications - Most recent A1c of 8.0 %. Goal A1c < 7.0 %.   - A1c down from 9.7% to 8.0 %  - I have praised him on the improved glycemic control, we discussed trying to work on CHO intake at night as hig BG's tends to be > 200 mg/dL late at night  - Will increase Trulicity , cautioned against GI side effects  - Offered CGM but [name] [name] is not interested, has tried in the past and didn't like the way it feel  - I am also going to try and switch Lantus to Toujeo due to high  Dose of insulin intake   MEDICATIONS: - Continue Lantus 110 units daily / Toujeo 110 units daily  - Increase Trulicity 1.5 mg weekly  - Continue jardiance 25 mg daily    EDUCATION / INSTRUCTIONS: BG monitoring instructions: Patient is instructed to check his blood sugars 1 times a day, fasting. Call [facility] if: BG persistently < 70  I reviewed the Rule of 15 for the treatment of hypoglycemia in detail with the patient. Literature supplied.    2) Diabetic complications:  Eye: Does not have known diabetic retinopathy.  Neuro/ Feet: Does not have known  diabetic peripheral neuropathy .  Renal: Patient does not have known baseline CKD. [name] [name]   is  on an ACEI/ARB at present.    F/U in    Signed electronically by: Lyndle Herrlich, MD  [facility]  [facility] [address] [place], [place] [address] Phone: [phone] FAX: [phone]   CC: No primary care provider on file. No primary provider on file. Phone: None  Fax: None  Return to Endocrinology clinic as below: Future Appointments  Date Time Provider Department Center  03/06/2021 10:30 AM Nehemias Sauceda, Konrad Dolores, MD [id number] None

## 2021-03-06 NOTE — Patient Instructions (Addendum)
-   Keep up the Good Work ! - Continue Lantus 110 units daily  - Increase Trulicity 1.5 mg weekly  - Continue jardiance 25 mg daily     HOW TO TREAT LOW BLOOD SUGARS (Blood sugar LESS THAN 70 MG/DL)  Please follow the RULE OF 15 for the treatment of hypoglycemia treatment (when your (blood sugars are less than 70 mg/dL)    STEP 1: Take 15 grams of carbohydrates when your blood sugar is low, which includes:   3-4 GLUCOSE TABS  OR  3-4 OZ OF JUICE OR REGULAR SODA OR  ONE TUBE OF GLUCOSE GEL     STEP 2: RECHECK blood sugar in 15 MINUTES STEP 3: If your blood sugar is still low at the 15 minute recheck --> then, go back to STEP 1 and treat AGAIN with another 15 grams of carbohydrates.

## 2021-03-19 ENCOUNTER — Telehealth: Payer: Self-pay | Admitting: Family Medicine

## 2021-03-19 ENCOUNTER — Other Ambulatory Visit: Payer: Self-pay | Admitting: Medical

## 2021-03-19 MED ORDER — TAMSULOSIN HCL 0.4 MG PO CAPS: 0.4000 mg | ORAL_CAPSULE | Freq: Every day | ORAL | 0 refills | Status: DC

## 2021-03-19 NOTE — Telephone Encounter (Signed)
Flomax sent for 30 days

## 2021-03-19 NOTE — Telephone Encounter (Signed)
Pt called, Blake Divine tried to talk with him.  He wanted to know why he was dismissed and why we would only fill meds for 30 days.  He had told [place] he had not found a pcp yet. I spoke with pt, advised him it was our policy to only fill meds for 30 days but I would ask Vincenza Hews to refill this for an add'l 30 days why he located new pcp.  Pt wanted to know why he was dismissed, I explained he never was happy here and was often not nice to our staff.  He said ok.  Will you refill his Flomax for an add't 30 days?

## 2021-03-20 NOTE — Telephone Encounter (Signed)
Called pt reached voice mail advised of refill.

## 2021-04-01 DIAGNOSIS — K529 Noninfective gastroenteritis and colitis, unspecified: Secondary | ICD-10-CM | POA: Diagnosis not present

## 2021-04-01 DIAGNOSIS — R112 Nausea with vomiting, unspecified: Secondary | ICD-10-CM | POA: Diagnosis not present

## 2021-04-04 DIAGNOSIS — A09 Infectious gastroenteritis and colitis, unspecified: Secondary | ICD-10-CM | POA: Diagnosis not present

## 2021-04-09 DIAGNOSIS — A09 Infectious gastroenteritis and colitis, unspecified: Secondary | ICD-10-CM | POA: Diagnosis not present

## 2021-04-27 ENCOUNTER — Other Ambulatory Visit: Payer: Self-pay | Admitting: Medical

## 2021-05-05 DIAGNOSIS — I10 Essential (primary) hypertension: Secondary | ICD-10-CM | POA: Diagnosis not present

## 2021-05-05 DIAGNOSIS — N400 Enlarged prostate without lower urinary tract symptoms: Secondary | ICD-10-CM | POA: Diagnosis not present

## 2021-05-05 DIAGNOSIS — E785 Hyperlipidemia, unspecified: Secondary | ICD-10-CM | POA: Diagnosis not present

## 2021-05-05 DIAGNOSIS — E1169 Type 2 diabetes mellitus with other specified complication: Secondary | ICD-10-CM | POA: Diagnosis not present

## 2021-05-09 DIAGNOSIS — D485 Neoplasm of uncertain behavior of skin: Secondary | ICD-10-CM | POA: Diagnosis not present

## 2021-05-09 DIAGNOSIS — L817 Pigmented purpuric dermatosis: Secondary | ICD-10-CM | POA: Diagnosis not present

## 2021-05-09 DIAGNOSIS — L821 Other seborrheic keratosis: Secondary | ICD-10-CM | POA: Diagnosis not present

## 2021-05-09 DIAGNOSIS — L918 Other hypertrophic disorders of the skin: Secondary | ICD-10-CM | POA: Diagnosis not present

## 2021-05-09 DIAGNOSIS — L570 Actinic keratosis: Secondary | ICD-10-CM | POA: Diagnosis not present

## 2021-05-09 DIAGNOSIS — D3611 Benign neoplasm of peripheral nerves and autonomic nervous system of face, head, and neck: Secondary | ICD-10-CM | POA: Diagnosis not present

## 2021-05-09 DIAGNOSIS — D225 Melanocytic nevi of trunk: Secondary | ICD-10-CM | POA: Diagnosis not present

## 2021-05-20 ENCOUNTER — Other Ambulatory Visit: Payer: Self-pay | Admitting: Medical

## 2021-05-20 ENCOUNTER — Other Ambulatory Visit: Payer: Self-pay | Admitting: Neurology

## 2021-05-21 ENCOUNTER — Other Ambulatory Visit: Payer: Self-pay | Admitting: Family Medicine

## 2021-06-18 DIAGNOSIS — M545 Low back pain, unspecified: Secondary | ICD-10-CM | POA: Diagnosis not present

## 2021-07-14 ENCOUNTER — Encounter: Payer: Self-pay | Admitting: Internal Medicine

## 2021-07-14 ENCOUNTER — Ambulatory Visit: Payer: BC Managed Care – PPO | Admitting: Internal Medicine

## 2021-07-14 ENCOUNTER — Other Ambulatory Visit: Payer: Self-pay

## 2021-07-14 VITALS — BP 124/68 | HR 75 | Ht 70.0 in | Wt 217.0 lb

## 2021-07-14 DIAGNOSIS — E1165 Type 2 diabetes mellitus with hyperglycemia: Secondary | ICD-10-CM

## 2021-07-14 DIAGNOSIS — Z794 Long term (current) use of insulin: Secondary | ICD-10-CM

## 2021-07-14 DIAGNOSIS — E291 Testicular hypofunction: Secondary | ICD-10-CM

## 2021-07-14 DIAGNOSIS — E119 Type 2 diabetes mellitus without complications: Secondary | ICD-10-CM

## 2021-07-14 LAB — POCT GLYCOSYLATED HEMOGLOBIN (HGB A1C): Hemoglobin A1C: 6.9 % — AB (ref 4.0–5.6)

## 2021-07-14 NOTE — Patient Instructions (Signed)
-   Keep UP the Good Work !   - Continue Toujeo 110 units daily  - Continue Trulicity 1.5 mg weekly  - Continue jardiance 25 mg daily    HOW TO TREAT LOW BLOOD SUGARS (Blood sugar LESS THAN 70 MG/DL) Please follow the RULE OF 15 for the treatment of hypoglycemia treatment (when your (blood sugars are less than 70 mg/dL)   STEP 1: Take 15 grams of carbohydrates when your blood sugar is low, which includes:  3-4 GLUCOSE TABS  OR 3-4 OZ OF JUICE OR REGULAR SODA OR ONE TUBE OF GLUCOSE GEL    STEP 2: RECHECK blood sugar in 15 MINUTES STEP 3: If your blood sugar is still low at the 15 minute recheck --> then, go back to STEP 1 and treat AGAIN with another 15 grams of carbohydrates.

## 2021-07-14 NOTE — Progress Notes (Signed)
Name: [name] [name]  Age/ Sex: 60 y.o., male   MRN/ DOB: [id number], 1961/06/05     PCP: Patient, No Pcp Per (Inactive)   Reason for Endocrinology Evaluation: Type 2 Diabetes Mellitus  Initial Endocrine Consultative Visit: 11/13/2020    PATIENT IDENTIFIER: [name] [name] is a 60 y.o. male with a past medical history of T2DM, CAD. The patient has followed with Endocrinology clinic since 11/13/2020 for consultative assistance with management of his diabetes.  DIABETIC HISTORY:  [name] [name] was diagnosed with DM in 2008, Metformin- diarrhea , glipizide - hypoglycemia. His hemoglobin A1c has ranged from 8.4% in 2021, peaking at 10.5% in 2020.    HYPOGONADISM HISTORY: Has been on testosterone for many years,   He has no prior radiation or surgeries on testicles.  He is not on opiates  He asked me to take this over 06/2021    SUBJECTIVE:   During the last visit (03/06/2021): A1c 8.0 % Increased Trulicity , continued Jardiance and Lantus     Today (07/14/2021): [name] [name]  He checks his blood sugars 2-3 times daily. The patient has not had hypoglycemic episodes since the last clinic visit.   He had diarrhea for the month of April, that he attributes to food poisoning. This has resolved  He is on testosterone intramuscular injections for many years, he has not taken it in the past 3 weeks  HOME DIABETES REGIMEN:  Toujeo 110 units daily  Trulicity 1.5 weekly  Jardiance 25 mg daily  Testosterone   200 mg  every 2 weeks    Statin: yes ACE-I/ARB: yes Prior Diabetic Education: yes   METER DOWNLOAD SUMMARY: Unable to download  91-225  mg/dL     DIABETIC COMPLICATIONS: Microvascular complications:   Denies: CKD, retinopathy, neuropathy Last Eye Exam: Completed 03/2020  Macrovascular complications:  CAD Denies:  CVA, PVD   HISTORY:  Past Medical History:  Past Medical History:  Diagnosis Date   Anxiety    Coronary artery disease    Depression     Diabetes mellitus    Type 2   Enlarged prostate    Environmental allergies    Environmental and seasonal allergies    GERD (gastroesophageal reflux disease)    Headache(784.0)    sinus and migraines   Hyperlipidemia    Hypertension    IBS (irritable bowel syndrome)    Low testosterone    Renal artery stenosis (HCC)    Sleep apnea    has used BiPAP in past; last sleep study >10 years   Past Surgical History:  Past Surgical History:  Procedure Laterality Date   COLONOSCOPY  09/2018   normal, repeat in 10 years per patient.  [place], [place]   KNEE ARTHROSCOPY Left    RENAL ANGIOGRAPHY N/A 07/16/2020   Procedure: RENAL ANGIOGRAPHY;  Surgeon: Yates Decamp, MD;  [facility];  Service: Cardiovascular;  Laterality: N/A;   SACROILIAC JOINT FUSION Right 05/02/2014   Procedure: SACROILIAC JOINT FUSION;  Surgeon: Emilee Hero, MD;  Location: [facility];  Service: Orthopedics;  Laterality: Right;  Right sided sacroiliac joint fusion   TONSILLECTOMY     uvula removed   Social History:  reports that he quit smoking about 29 years ago. His smoking use included cigarettes. He has a 10.00 pack-year smoking history. He has never used smokeless tobacco. He reports that he does not drink alcohol and does not use drugs. Family History:  Family History  Problem Relation Age of Onset   Cancer Mother  lung   Cancer Father        prostate, mets to bone, lung   Cancer Brother        melanoma   Heart disease Maternal Grandmother        MI   Lung disease Maternal Grandfather        black lung   Heart disease Paternal Grandmother    Stroke Paternal Grandmother    Heart disease Paternal Grandfather    Heart disease Brother 33       MI   Diabetes Brother      HOME MEDICATIONS: Allergies as of 07/14/2021       Reactions   Pravastatin Other (See Comments)   Joint myalgias   Trazodone And Nefazodone    Had restless legs   Hydrochlorothiazide Other (See Comments)    lightheadedness   Metformin And Related Diarrhea        Medication List        Accurate as of July 14, 2021 10:21 AM. If you have any questions, ask your nurse or doctor.          amLODipine 10 MG tablet Commonly known as: NORVASC TAKE 1 TABLET(10 MG) BY MOUTH DAILY   atorvastatin 80 MG tablet Commonly known as: LIPITOR TAKE 1 TABLET(80 MG) BY MOUTH AT BEDTIME   BD Pen Needle Nano U/F 32G X 4 MM Misc Generic drug: Insulin Pen Needle 1 each by Does not apply route at bedtime.   carvedilol 12.5 MG tablet Commonly known as: COREG TAKE 1 TABLET BY MOUTH TWICE DAILY. HOLD IF BLOOD PRESSURE LESS THAN   cetirizine 10 MG tablet Commonly known as: ZYRTEC Take 1 tablet (10 mg total) by mouth daily.   empagliflozin 25 MG Tabs tablet Commonly known as: Jardiance Take 1 tablet (25 mg total) by mouth daily.   fluticasone 50 MCG/ACT nasal spray Commonly known as: FLONASE Place 2 sprays into both nostrils daily.   hydrALAZINE 25 MG tablet Commonly known as: APRESOLINE TAKE 1 TABLET(25 MG) BY MOUTH THREE TIMES DAILY   lisinopril 40 MG tablet Commonly known as: ZESTRIL TAKE 1 TABLET(40 MG) BY MOUTH DAILY   Needles & Syringes Misc As directed 93ml of testosterone every 14 days   rOPINIRole 0.25 MG tablet Commonly known as: REQUIP TAKE 1 TABLET(0.25 MG) BY MOUTH AT BEDTIME   tamsulosin 0.4 MG Caps capsule Commonly known as: FLOMAX Take 1 capsule (0.4 mg total) by mouth daily.   testosterone cypionate 200 MG/ML injection Commonly known as: DEPOTESTOSTERONE CYPIONATE Inject 1 mL (200 mg total) into the muscle every 14 (fourteen) days.   Toujeo Max SoloStar 300 UNIT/ML Solostar Pen Generic drug: insulin glargine (2 Unit Dial) Inject 110 Units into the skin daily.   Trintellix 20 MG Tabs tablet Generic drug: vortioxetine HBr TAKE 1 TABLET(20 MG) BY MOUTH DAILY   Trulicity 1.5 MG/0.5ML Sopn Generic drug: Dulaglutide Inject 1.5 mg into the skin once a week.          OBJECTIVE:   Vital Signs: BP 124/68   Pulse 75   Ht 5\' 10"  (1.778 m)   Wt 217 lb (98.4 kg)   SpO2 97%   BMI 31.14 kg/m   Wt Readings from Last 3 Encounters:  07/14/21 217 lb (98.4 kg)  03/06/21 227 lb (103 kg)  01/01/21 228 lb 3.2 oz (103.5 kg)     Exam: General: Pt appears well and is in NAD  Lungs: Clear with good BS bilat with no rales, rhonchi,  or wheezes  Heart: RRR   Abdomen: Normoactive bowel sounds, soft, nontender, without masses or organomegaly palpable  Extremities: No pretibial edema.  Neuro: MS is good with appropriate affect, pt is alert and Ox3      DATA REVIEWED:  Lab Results  Component Value Date   HGBA1C 6.9 (A) 07/14/2021   HGBA1C 8.0 (A) 03/06/2021   HGBA1C 9.7 (H) 10/29/2020   Lab Results  Component Value Date   MICROALBUR 0.50 11/11/2010   LDLCALC 78 07/05/2020   CREATININE 1.26 (H) 12/20/2020   Lab Results  Component Value Date   MICRALBCREAT 5 01/30/2020     Lab Results  Component Value Date   CHOL 126 07/05/2020   HDL 29 (L) 07/05/2020   LDLCALC 78 07/05/2020   LDLDIRECT 93 08/20/2009   TRIG 100 07/05/2020   CHOLHDL 4.4 05/13/2020         ASSESSMENT / PLAN / RECOMMENDATIONS:   1) Type 2 Diabetes Mellitus, Optimaly controlled  , Without complications - Most recent A1c of 6.9 %. Goal A1c < 7.0 %.   -I have praised the patient on improved glycemic control -I have offered CGM in the past but he is not interested, has tried in the past and didn't like the way it feel   MEDICATIONS: - Continue Toujeo 110 units daily  - Continue Trulicity 1.5 mg weekly  - Continue jardiance 25 mg daily    EDUCATION / INSTRUCTIONS: BG monitoring instructions: Patient is instructed to check his blood sugars 1 times a day, fasting. Call [facility] if: BG persistently < 70  I reviewed the Rule of 15 for the treatment of hypoglycemia in detail with the patient. Literature supplied.    2) Diabetic complications:   Eye: Does not have known diabetic retinopathy.  Neuro/ Feet: Does not have known diabetic peripheral neuropathy .  Renal: Patient does not have known baseline CKD. He   is  on an ACEI/ARB at present.    3) Hypogonadism:   -This has been diagnosed many years ago, unknown etiology -He usually takes 1 mL of testosterone every 2 weeks, but he forgot his dose last week and is waiting until his next scheduled injection, I have advised the patient that he can take his injection this week and restart every 2-week schedule -We will check CBC, CMP, PSA, prolactin and testosterone    Medication Testosterone 200 mg IM every 2 weeks  F/U in    Signed electronically by: Lyndle Herrlich, MD  [facility]  [facility] [address] [place], [place] [address] Phone: [phone] FAX: [phone]   CC: Patient, No Pcp Per (Inactive) No address on file Phone: None  Fax: None  Return to Endocrinology clinic as below: Future Appointments  Date Time Provider Department Center  08/29/2021  8:40 AM Lyn Records, MD [facility]

## 2021-08-03 NOTE — Telephone Encounter (Signed)
Virtual visit 

## 2021-08-22 ENCOUNTER — Other Ambulatory Visit: Payer: Self-pay | Admitting: Cardiology

## 2021-08-22 ENCOUNTER — Other Ambulatory Visit: Payer: Self-pay | Admitting: Neurology

## 2021-08-22 DIAGNOSIS — E1169 Type 2 diabetes mellitus with other specified complication: Secondary | ICD-10-CM | POA: Diagnosis not present

## 2021-08-22 DIAGNOSIS — I129 Hypertensive chronic kidney disease with stage 1 through stage 4 chronic kidney disease, or unspecified chronic kidney disease: Secondary | ICD-10-CM

## 2021-08-22 DIAGNOSIS — N400 Enlarged prostate without lower urinary tract symptoms: Secondary | ICD-10-CM | POA: Diagnosis not present

## 2021-08-22 DIAGNOSIS — E785 Hyperlipidemia, unspecified: Secondary | ICD-10-CM | POA: Diagnosis not present

## 2021-08-22 MED ORDER — HYDRALAZINE HCL 25 MG PO TABS: ORAL_TABLET | ORAL | 0 refills | Status: DC

## 2021-08-27 NOTE — Progress Notes (Deleted)
Cardiology Office Note:    Date:  08/27/2021   ID:  [name] [name], DOB 07-May-1961, MRN [id number]  PCP:  Farris Has, MD  Cardiologist:  Lesleigh Noe, MD   Referring MD: No ref. provider found   No chief complaint on file.   History of Present Illness:    [name] [name] is a 60 y.o. male with a hx of non-obstructive CAD, resistant primary hypertension, DM II, hyperlipidemia, OSA, DM 2, and recent renal angiography with no abnormality found. Transferring care from Dr. Odis Hollingshead / [facility]   ***  Past Medical History:  Diagnosis Date   Anxiety    Coronary artery disease    Depression    Diabetes mellitus    Type 2   Enlarged prostate    Environmental allergies    Environmental and seasonal allergies    GERD (gastroesophageal reflux disease)    Headache(784.0)    sinus and migraines   Hyperlipidemia    Hypertension    IBS (irritable bowel syndrome)    Low testosterone    Renal artery stenosis (HCC)    Sleep apnea    has used BiPAP in past; last sleep study >10 years    Past Surgical History:  Procedure Laterality Date   COLONOSCOPY  09/2018   normal, repeat in 10 years per patient.  [place], [place]   KNEE ARTHROSCOPY Left    RENAL ANGIOGRAPHY N/A 07/16/2020   Procedure: RENAL ANGIOGRAPHY;  Surgeon: Yates Decamp, MD;  Location: [facility];  Service: Cardiovascular;  Laterality: N/A;   SACROILIAC JOINT FUSION Right 05/02/2014   Procedure: SACROILIAC JOINT FUSION;  Surgeon: Emilee Hero, MD;  Location: [facility] OR;  Service: Orthopedics;  Laterality: Right;  Right sided sacroiliac joint fusion   TONSILLECTOMY     uvula removed    Current Medications: No outpatient medications have been marked as taking for the 08/29/21 encounter (Appointment) with Lyn Records, MD.     Allergies:   Pravastatin, Trazodone and nefazodone, Hydrochlorothiazide, and Metformin and related   Social History   Socioeconomic History   Marital status: Single     Spouse name: Not on file   Number of children: Not on file   Years of education: Not on file   Highest education level: Not on file  Occupational History   Not on file  Tobacco Use   Smoking status: Former    Packs/day: 1.00    Years: 10.00    Pack years: 10.00    Types: Cigarettes    Quit date: 04/20/1992    Years since quitting: 29.3   Smokeless tobacco: Never  Vaping Use   Vaping Use: Never used  Substance and Sexual Activity   Alcohol use: No   Drug use: No   Sexual activity: Not on file  Other Topics Concern   Not on file  Social History Narrative   Not on file   Social Determinants of Health   Financial Resource Strain: Not on file  Food Insecurity: Not on file  Transportation Needs: Not on file  Physical Activity: Not on file  Stress: Not on file  Social Connections: Not on file     Family History: The patient's family history includes Cancer in his brother, father, and mother; Diabetes in his brother; Heart disease in his maternal grandmother, paternal grandfather, and paternal grandmother; Heart disease (age of onset: 96) in his brother; Lung disease in his maternal grandfather; Stroke in his paternal grandmother.  ROS:  Please see the history of present illness.    *** All other systems reviewed and are negative.  EKGs/Labs/Other Studies Reviewed:    The following studies were reviewed today:  COR CTA 03/2020: IMPRESSION: 1. Calcium score 95 which is 76 th percentile for age and sex   2.  Normal aortic root 2.9 cm   3.  CAD CAD RADS 3 most significant in LAD and diagonal   4.  Study sent for FFR CT  FINDINGS: No FFR CT findings < 0.80   RCA 0.99   Circumflex 0.90   LAD 0.94 proximally 0.88 mid vessel 0.84 distally   D1: 0.89  EKG:  EKG ***  Recent Labs: 12/20/2020: ALT 32; BUN 23; Creatinine, Ser 1.26; Hemoglobin 17.8; Platelets 275; Potassium 4.0; Sodium 139  Recent Lipid Panel    Component Value Date/Time   CHOL 126 07/05/2020 0922    TRIG 100 07/05/2020 0922   HDL 29 (L) 07/05/2020 0922   CHOLHDL 4.4 05/13/2020 0959   CHOLHDL 6.6 Ratio 06/17/2010 2242   VLDL 60 (H) 06/17/2010 2242   LDLCALC 78 07/05/2020 0922   LDLDIRECT 93 08/20/2009 2019    Physical Exam:    VS:  There were no vitals taken for this visit.    Wt Readings from Last 3 Encounters:  07/14/21 217 lb (98.4 kg)  03/06/21 227 lb (103 kg)  01/01/21 228 lb 3.2 oz (103.5 kg)     GEN: ***. No acute distress HEENT: Normal NECK: No JVD. LYMPHATICS: No lymphadenopathy CARDIAC: *** murmur. RRR *** gallop, or edema. VASCULAR: *** Normal Pulses. No bruits. RESPIRATORY:  Clear to auscultation without rales, wheezing or rhonchi  ABDOMEN: Soft, non-tender, non-distended, No pulsatile mass, MUSCULOSKELETAL: No deformity  SKIN: Warm and dry NEUROLOGIC:  Alert and oriented x 3 PSYCHIATRIC:  Normal affect   ASSESSMENT:    1. Nonobstructive atherosclerosis of coronary artery   2. Type 2 diabetes mellitus with stage 2 chronic kidney disease, with long-term current use of insulin (HCC)   3. Mixed hyperlipidemia   4. Essential hypertension, benign   5. Obstructive sleep apnea syndrome   6. Renovascular hypertension   7. Former smoker    PLAN:    In order of problems listed above:  ***   Medication Adjustments/Labs and Tests Ordered: Current medicines are reviewed at length with the patient today.  Concerns regarding medicines are outlined above.  No orders of the defined types were placed in this encounter.  No orders of the defined types were placed in this encounter.   There are no Patient Instructions on file for this visit.   Signed, Lesleigh Noe, MD  08/27/2021 8:39 PM    [facility]

## 2021-08-29 ENCOUNTER — Ambulatory Visit: Payer: BC Managed Care – PPO | Admitting: Interventional Cardiology

## 2021-08-29 DIAGNOSIS — I150 Renovascular hypertension: Secondary | ICD-10-CM

## 2021-08-29 DIAGNOSIS — I2510 Atherosclerotic heart disease of native coronary artery without angina pectoris: Secondary | ICD-10-CM

## 2021-08-29 DIAGNOSIS — I10 Essential (primary) hypertension: Secondary | ICD-10-CM

## 2021-08-29 DIAGNOSIS — E1122 Type 2 diabetes mellitus with diabetic chronic kidney disease: Secondary | ICD-10-CM

## 2021-08-29 DIAGNOSIS — Z87891 Personal history of nicotine dependence: Secondary | ICD-10-CM

## 2021-08-29 DIAGNOSIS — G4733 Obstructive sleep apnea (adult) (pediatric): Secondary | ICD-10-CM

## 2021-08-29 DIAGNOSIS — E782 Mixed hyperlipidemia: Secondary | ICD-10-CM

## 2021-10-15 DIAGNOSIS — Z0279 Encounter for issue of other medical certificate: Secondary | ICD-10-CM

## 2021-10-17 ENCOUNTER — Telehealth: Payer: Self-pay

## 2021-10-17 NOTE — Telephone Encounter (Signed)
Patient advise that paperwork is ready. Patient also advise of cost as well.

## 2021-10-28 ENCOUNTER — Other Ambulatory Visit: Payer: Self-pay | Admitting: Interventional Cardiology

## 2021-11-03 ENCOUNTER — Telehealth: Payer: Self-pay | Admitting: Internal Medicine

## 2021-11-03 ENCOUNTER — Other Ambulatory Visit: Payer: Self-pay | Admitting: Internal Medicine

## 2021-11-03 NOTE — Telephone Encounter (Signed)
NA

## 2021-11-10 ENCOUNTER — Telehealth: Payer: Self-pay | Admitting: Internal Medicine

## 2021-11-10 NOTE — Telephone Encounter (Signed)
Pt had to reschedule appt to 12/19 and needs a refill testosterone and would like to try the patched instead of the injections.  [id number] [place], [place] - [address] AT [facility] [address] [facility] [address]  [address], [place] [address]   Pt contact [phone]

## 2021-11-11 ENCOUNTER — Other Ambulatory Visit: Payer: Self-pay | Admitting: Internal Medicine

## 2021-11-11 ENCOUNTER — Other Ambulatory Visit (HOSPITAL_COMMUNITY): Payer: Self-pay

## 2021-11-11 MED ORDER — TESTOSTERONE 20.25 MG/ACT (1.62%) TD GEL: 81.0000 mg | Freq: Every day | TRANSDERMAL | 1 refills | Status: DC

## 2021-11-11 NOTE — Telephone Encounter (Signed)
Vm left for patient to callback about message below 

## 2021-11-11 NOTE — Telephone Encounter (Signed)
Patient returned call. I gave message to patient.

## 2021-11-12 ENCOUNTER — Telehealth: Payer: Self-pay | Admitting: Pharmacy Technician

## 2021-11-12 NOTE — Telephone Encounter (Signed)
Patient Advocate Encounter   Received notification from CoverMyMeds that prior authorization for Testosterone Gel 20.25mg /act (1.62%) is required by his/her insurance [employer] [place].  Per ins PA form: Does the patient have TWO pretreatment or current serum testosterone levels (free or total) measured in the morning (between 7am and 11am) on two separate days that are below the testing laboratory's normal range? Please note, lab results are required.    I wasn't able to find any lab results. Once the pt has had these 2 labs, or if I missed them somewhere and you can send them to me, I will finish submitting PA.

## 2021-11-12 NOTE — Telephone Encounter (Addendum)
Patient Advocate Encounter   Received notification from CoverMyMeds that prior authorization for Testosterone Gel 20.25mg /act (1.62%) is required by his/her insurance [employer] [place].   PA submitted on 11/12/21  Key [id number] Status is pending    [facility] will continue to follow:  Patient Advocate Fax:  [phone]

## 2021-11-13 ENCOUNTER — Other Ambulatory Visit (HOSPITAL_COMMUNITY): Payer: Self-pay

## 2021-11-13 NOTE — Telephone Encounter (Signed)
Patient Advocate Encounter  Received notification from CoverMyMeds that the request for prior authorization for Testosterone gel has been denied due to Not having 2 pretreatment or current testosterone levels measured in the morning on 2 separate day that are below the testing lab's normal range.     I filled out a form that was sent to [place] as well and re-faxed it, including additional labs from last year when the pt was off Testosterone replacement therapy. I'm not sure if this will be acceptable since they aren't recent.   This encounter will continue to be updated until final determination.

## 2021-11-14 ENCOUNTER — Ambulatory Visit: Payer: BC Managed Care – PPO | Admitting: Internal Medicine

## 2021-11-14 ENCOUNTER — Other Ambulatory Visit: Payer: Self-pay | Admitting: Internal Medicine

## 2021-11-14 DIAGNOSIS — E291 Testicular hypofunction: Secondary | ICD-10-CM

## 2021-11-14 NOTE — Telephone Encounter (Signed)
Patient Advocate Encounter  Received notification from [employer] that the request for prior authorization for Testosterone gel hasn't been approved. Since he's been off therapy one more pretreatment testosterone level needs to be measured in the morning b/w 7a and 11a. The level from 04/13/19 was accepted. Looks like there's one from 01/30/20 on the labs I sent in, but it was done after 12, if I'm reading it correctly.   Can you guys have him come in soon for a second lab?

## 2021-11-18 ENCOUNTER — Other Ambulatory Visit: Payer: Self-pay

## 2021-11-18 ENCOUNTER — Other Ambulatory Visit (INDEPENDENT_AMBULATORY_CARE_PROVIDER_SITE_OTHER): Payer: BC Managed Care – PPO

## 2021-11-18 DIAGNOSIS — E291 Testicular hypofunction: Secondary | ICD-10-CM | POA: Diagnosis not present

## 2021-11-18 LAB — PSA: PSA: 0.64 ng/mL (ref 0.10–4.00)

## 2021-11-18 LAB — CBC
HCT: 50.6 % (ref 39.0–52.0)
Hemoglobin: 16.6 g/dL (ref 13.0–17.0)
MCHC: 32.7 g/dL (ref 30.0–36.0)
MCV: 91.9 fl (ref 78.0–100.0)
Platelets: 190 10*3/uL (ref 150.0–400.0)
RBC: 5.5 Mil/uL (ref 4.22–5.81)
RDW: 16.4 % — ABNORMAL HIGH (ref 11.5–15.5)
WBC: 8 10*3/uL (ref 4.0–10.5)

## 2021-11-18 LAB — COMPREHENSIVE METABOLIC PANEL
ALT: 29 U/L (ref 0–53)
AST: 24 U/L (ref 0–37)
Albumin: 4.5 g/dL (ref 3.5–5.2)
Alkaline Phosphatase: 78 U/L (ref 39–117)
BUN: 22 mg/dL (ref 6–23)
CO2: 28 mEq/L (ref 19–32)
Calcium: 9.6 mg/dL (ref 8.4–10.5)
Chloride: 106 mEq/L (ref 96–112)
Creatinine, Ser: 1.16 mg/dL (ref 0.40–1.50)
GFR: 69.39 mL/min (ref >60.00)
Glucose, Bld: 103 mg/dL — ABNORMAL HIGH (ref 70–99)
Potassium: 4.4 mEq/L (ref 3.5–5.1)
Sodium: 142 mEq/L (ref 135–145)
Total Bilirubin: 0.5 mg/dL (ref 0.2–1.2)
Total Protein: 6.8 g/dL (ref 6.0–8.3)

## 2021-11-18 LAB — LUTEINIZING HORMONE: LH: 1.71 m[IU]/mL (ref 1.50–9.30)

## 2021-11-18 LAB — TESTOSTERONE: Testosterone: 248.19 ng/dL — ABNORMAL LOW (ref 300.00–890.00)

## 2021-11-18 NOTE — Telephone Encounter (Signed)
Patient did lab work today.

## 2021-11-18 NOTE — Telephone Encounter (Signed)
Vm left for patient to callback to schedule for lab required by insurance for testosterone

## 2021-11-19 LAB — PROLACTIN: Prolactin: 6.9 ng/mL (ref 2.0–18.0)

## 2021-11-24 NOTE — Telephone Encounter (Signed)
Faxed additional labs today, 11/24/21, to hopefully get PA approved.

## 2021-11-26 ENCOUNTER — Other Ambulatory Visit: Payer: Self-pay | Admitting: Interventional Cardiology

## 2021-11-26 ENCOUNTER — Other Ambulatory Visit (HOSPITAL_COMMUNITY): Payer: Self-pay

## 2021-11-26 DIAGNOSIS — I129 Hypertensive chronic kidney disease with stage 1 through stage 4 chronic kidney disease, or unspecified chronic kidney disease: Secondary | ICD-10-CM

## 2021-11-26 DIAGNOSIS — E1122 Type 2 diabetes mellitus with diabetic chronic kidney disease: Secondary | ICD-10-CM

## 2021-11-26 NOTE — Telephone Encounter (Signed)
Received notification from [facility] regarding a prior authorization for TESTOSTERONE 20.25 (1.62%) GEL. Authorization has been APPROVED from 11.28.22 to 11.28.23.   Per test claim, copay for 30 days supply is $10   Authorization # [id number]

## 2021-11-27 NOTE — Telephone Encounter (Signed)
Patient has been notified

## 2021-12-15 ENCOUNTER — Encounter: Payer: Self-pay | Admitting: Internal Medicine

## 2021-12-15 ENCOUNTER — Ambulatory Visit: Payer: BC Managed Care – PPO | Admitting: Internal Medicine

## 2021-12-15 ENCOUNTER — Other Ambulatory Visit: Payer: Self-pay

## 2021-12-15 VITALS — BP 134/82 | HR 75 | Ht 70.0 in | Wt 222.4 lb

## 2021-12-15 DIAGNOSIS — Z794 Long term (current) use of insulin: Secondary | ICD-10-CM | POA: Diagnosis not present

## 2021-12-15 DIAGNOSIS — E1165 Type 2 diabetes mellitus with hyperglycemia: Secondary | ICD-10-CM | POA: Diagnosis not present

## 2021-12-15 DIAGNOSIS — E291 Testicular hypofunction: Secondary | ICD-10-CM | POA: Diagnosis not present

## 2021-12-15 LAB — POCT GLYCOSYLATED HEMOGLOBIN (HGB A1C): Hemoglobin A1C: 7.2 % — AB (ref 4.0–5.6)

## 2021-12-15 MED ORDER — EMPAGLIFLOZIN 25 MG PO TABS: 25.0000 mg | ORAL_TABLET | Freq: Every day | ORAL | 3 refills | Status: DC

## 2021-12-15 MED ORDER — TRULICITY 3 MG/0.5ML ~~LOC~~ SOAJ: 3.0000 mg | SUBCUTANEOUS | 2 refills | Status: DC

## 2021-12-15 MED ORDER — TOUJEO MAX SOLOSTAR 300 UNIT/ML ~~LOC~~ SOPN: 100.0000 [IU] | PEN_INJECTOR | Freq: Every day | SUBCUTANEOUS | 3 refills | Status: DC

## 2021-12-15 NOTE — Patient Instructions (Addendum)
-   Increase Trulicity 3 mg weekly  - Decrease  Toujeo 100 units daily  - Continue Jardiance 25 mg daily  - Androgel 2 pumps to each shoulder    HOW TO TREAT LOW BLOOD SUGARS (Blood sugar LESS THAN 70 MG/DL) Please follow the RULE OF 15 for the treatment of hypoglycemia treatment (when your (blood sugars are less than 70 mg/dL)   STEP 1: Take 15 grams of carbohydrates when your blood sugar is low, which includes:  3-4 GLUCOSE TABS  OR 3-4 OZ OF JUICE OR REGULAR SODA OR ONE TUBE OF GLUCOSE GEL    STEP 2: RECHECK blood sugar in 15 MINUTES STEP 3: If your blood sugar is still low at the 15 minute recheck --> then, go back to STEP 1 and treat AGAIN with another 15 grams of carbohydrates.

## 2021-12-15 NOTE — Progress Notes (Signed)
Name: [name] [name]  Age/ Sex: 60 y.o., male   MRN/ DOB: [id number], [birth year]     PCP: Farris Has, MD   Reason for Endocrinology Evaluation: Type 2 Diabetes Mellitus  Initial Endocrine Consultative Visit: 11/13/2020    PATIENT IDENTIFIER: [name] [name] is a 60 y.o. male with a past medical history of T2DM, CAD. The patient has followed with Endocrinology clinic since 11/13/2020 for consultative assistance with management of his diabetes.  DIABETIC HISTORY:  [name] was diagnosed with DM in 2008, Metformin- diarrhea , glipizide - hypoglycemia. His hemoglobin A1c has ranged from 8.4% in 2021, peaking at 10.5% in 2020.    HYPOGONADISM HISTORY: Has been on testosterone for many years,   He has no prior radiation or surgeries on testicles.  He is not on opiates  He asked me to take this over 06/2021   By 10/2021 he asked to switch to topical testosterone.     SUBJECTIVE:   During the last visit (07/14/2021): A1c 6.9% Continued  Trulicity ,  Jardiance and Lantus     Today (12/15/2021): [name]  is here for a follow up on diabetes management and hypogonadism. He checks his blood sugars 2-3 times daily. The patient has not had hypoglycemic episodes since the last clinic visit.   He has not started testosterone gel yet, as he has 2 doses of injectable   He had an episode of diarrhea with x1 vomiting  that he attributes to bad milk . He typically has constipation    HOME ENDOCRINE  REGIMEN:  Toujeo 110 units daily  Trulicity 1.5 weekly ( Friday )  Jardiance 25 mg daily  Androgel 2 pumps to each shoulder     Statin: yes ACE-I/ARB: yes Prior Diabetic Education: yes   METER DOWNLOAD SUMMARY: Unable to download  60 day average 155 mg/dL  65-784 mg/dL    DIABETIC COMPLICATIONS: Microvascular complications:   Denies: CKD, retinopathy, neuropathy Last Eye Exam: Completed 01/2021  Macrovascular complications:  CAD Denies:  CVA,  PVD   HISTORY:  Past Medical History:  Past Medical History:  Diagnosis Date   Anxiety    Coronary artery disease    Depression    Diabetes mellitus    Type 2   Enlarged prostate    Environmental allergies    Environmental and seasonal allergies    GERD (gastroesophageal reflux disease)    Headache(784.0)    sinus and migraines   Hyperlipidemia    Hypertension    IBS (irritable bowel syndrome)    Low testosterone    Renal artery stenosis (HCC)    Sleep apnea    has used BiPAP in past; last sleep study >10 years   Past Surgical History:  Past Surgical History:  Procedure Laterality Date   COLONOSCOPY  09/2018   normal, repeat in 10 years per patient.  [place], [place]   KNEE ARTHROSCOPY Left    RENAL ANGIOGRAPHY N/A 07/16/2020   Procedure: RENAL ANGIOGRAPHY;  Surgeon: Yates Decamp, MD;  Location: [facility];  Service: Cardiovascular;  Laterality: N/A;   SACROILIAC JOINT FUSION Right 05/02/2014   Procedure: SACROILIAC JOINT FUSION;  Surgeon: Emilee Hero, MD;  Location: [facility] OR;  Service: Orthopedics;  Laterality: Right;  Right sided sacroiliac joint fusion   TONSILLECTOMY     uvula removed   Social History:  reports that he quit smoking about 29 years ago. His smoking use included cigarettes. He has a 10.00 pack-year smoking history. He has never used smokeless  tobacco. He reports that he does not drink alcohol and does not use drugs. Family History:  Family History  Problem Relation Age of Onset   Cancer Mother        lung   Cancer Father        prostate, mets to bone, lung   Cancer Brother        melanoma   Heart disease Maternal Grandmother        MI   Lung disease Maternal Grandfather        black lung   Heart disease Paternal Grandmother    Stroke Paternal Grandmother    Heart disease Paternal Grandfather    Heart disease Brother 8       MI   Diabetes Brother      HOME MEDICATIONS: Allergies as of 12/15/2021       Reactions   Pravastatin  Other (See Comments)   Joint myalgias   Trazodone And Nefazodone    Had restless legs   Hydrochlorothiazide Other (See Comments)   lightheadedness   Metformin And Related Diarrhea        Medication List        Accurate as of December 15, 2021  9:50 AM. If you have any questions, ask your nurse or doctor.          STOP taking these medications    Trulicity 1.5 MG/0.5ML Sopn Generic drug: Dulaglutide Replaced by: Trulicity 3 MG/0.5ML Sopn Stopped by: Scarlette Shorts, MD       TAKE these medications    amLODipine 10 MG tablet Commonly known as: NORVASC TAKE 1 TABLET(10 MG) BY MOUTH DAILY   atorvastatin 80 MG tablet Commonly known as: LIPITOR TAKE 1 TABLET(80 MG) BY MOUTH AT BEDTIME   BD Pen Needle Nano U/F 32G X 4 MM Misc Generic drug: Insulin Pen Needle 1 each by Does not apply route at bedtime.   carvedilol 12.5 MG tablet Commonly known as: COREG TAKE 1 TABLET BY MOUTH TWICE DAILY. HOLD IF BLOOD PRESSURE LESS THAN   cetirizine 10 MG tablet Commonly known as: ZYRTEC Take 1 tablet (10 mg total) by mouth daily.   empagliflozin 25 MG Tabs tablet Commonly known as: Jardiance Take 1 tablet (25 mg total) by mouth daily.   fluticasone 50 MCG/ACT nasal spray Commonly known as: FLONASE Place 2 sprays into both nostrils daily.   hydrALAZINE 25 MG tablet Commonly known as: APRESOLINE TAKE 1 TABLET(25 MG) BY MOUTH THREE TIMES DAILY   lisinopril 40 MG tablet Commonly known as: ZESTRIL TAKE 1 TABLET(40 MG) BY MOUTH DAILY   Needles & Syringes Misc As directed 13ml of testosterone every 14 days   rOPINIRole 0.5 MG tablet Commonly known as: REQUIP Take 1 tablet by mouth daily. What changed: Another medication with the same name was removed. Continue taking this medication, and follow the directions you see here. Changed by: Scarlette Shorts, MD   tamsulosin 0.4 MG Caps capsule Commonly known as: FLOMAX Take 1 capsule (0.4 mg total) by mouth  daily.   Testosterone 20.25 MG/ACT (1.62%) Gel Place 81 mg onto the skin daily in the afternoon. 2 pumps to each shoulder   Toujeo Max SoloStar 300 UNIT/ML Solostar Pen Generic drug: insulin glargine (2 Unit Dial) Inject 100 Units into the skin daily. What changed: how much to take Changed by: Scarlette Shorts, MD   Trintellix 20 MG Tabs tablet Generic drug: vortioxetine HBr TAKE 1 TABLET(20 MG) BY MOUTH DAILY   Trulicity  3 MG/0.5ML Sopn Generic drug: Dulaglutide Inject 3 mg as directed once a week. Replaces: Trulicity 1.5 MG/0.5ML Sopn Started by: Scarlette Shorts, MD         OBJECTIVE:   Vital Signs: BP 134/82 (BP Location: Left Arm, Patient Position: Sitting, Cuff Size: Small)    Pulse 75    Ht  (1.778 m)    Wt 222 lb 6.4 oz (100.9 kg)    SpO2 97%    BMI 31.91 kg/m   Wt Readings from Last 3 Encounters:  12/15/21 222 lb 6.4 oz (100.9 kg)  07/14/21 217 lb (98.4 kg)  03/06/21 227 lb (103 kg)     Exam: General: Pt appears well and is in NAD  Lungs: Clear with good BS bilat with no rales, rhonchi, or wheezes  Heart: RRR   Abdomen: Normoactive bowel sounds, soft, nontender, without masses or organomegaly palpable  Extremities: No pretibial edema.  Neuro: MS is good with appropriate affect, pt is alert and Ox3    DM Foot Exam 12/15/2021 The skin of the feet is intact without sores or ulcerations. The pedal pulses are 2+ on right and 2+ on left. The sensation is decreased  to a screening 5.07, 10 gram monofilament on the right toes    DATA REVIEWED:  Lab Results  Component Value Date   HGBA1C 7.2 (A) 12/15/2021   HGBA1C 6.9 (A) 07/14/2021   HGBA1C 8.0 (A) 03/06/2021   Lab Results  Component Value Date   MICROALBUR 0.50 11/11/2010   LDLCALC 78 07/05/2020   CREATININE 1.16 11/18/2021   Lab Results  Component Value Date   MICRALBCREAT 5 01/30/2020     Lab Results  Component Value Date   CHOL 126 07/05/2020   HDL 29 (L) 07/05/2020    LDLCALC 78 07/05/2020   LDLDIRECT 93 08/20/2009   TRIG 100 07/05/2020   CHOLHDL 4.4 05/13/2020         ASSESSMENT / PLAN / RECOMMENDATIONS:   1) Type 2 Diabetes Mellitus, Optimaly controlled  , With Neuropathic  complications - Most recent A1c of 7.2 %. Goal A1c < 7.0 %.   - A1c slightly increased from 6.9% to 7.2 %  - I will increase his trulicity and reduce his basal insulin as he had BG's as low as 75 mg/dL , so will reduce basal insulin preemptively -I have offered CGM in the past but he is not interested, has tried in the past and didn't like the way it feel   MEDICATIONS: - Decrease Toujeo 100 units daily  - Increase  Trulicity 3 mg weekly  - Continue jardiance 25 mg daily    EDUCATION / INSTRUCTIONS: BG monitoring instructions: Patient is instructed to check his blood sugars 1 times a day, fasting. Call [facility] if: BG persistently < 70  I reviewed the Rule of 15 for the treatment of hypoglycemia in detail with the patient. Literature supplied.    2) Diabetic complications:  Eye: Does not have known diabetic retinopathy.  Neuro/ Feet: Does have known diabetic peripheral neuropathy based on foot exam 12/15/2021 Renal: Patient does not have known baseline CKD. He   is  on an ACEI/ARB at present.    3) Hypogonadism:   -This has been diagnosed many years ago, unknown etiology - Due to issues  with injectable testosterone administration he request to switch to topical testosterone . He is finishing the last dose of injectable and will switch to topical    Medication Androgel 2  pumps to each shoulder   F/U in 4 months    Signed electronically by: Lyndle Herrlich, MD  [facility] Endocr[facility]  [facility] [address] [place], [place] [address] Phone: [phone] FAX: [phone]   CC: Farris Has, MD [address] [place] [address] Phone: [phone]  Fax: [phone]  Return  to Endocrinology clinic as below: Future Appointments  Date Time Provider Department Center  03/30/2022  4:00 PM Lyn Records, MD [facility]  04/20/2022  9:10 AM Lamont Tant, Konrad Dolores, MD [facility] None

## 2021-12-30 ENCOUNTER — Other Ambulatory Visit (HOSPITAL_COMMUNITY): Payer: Self-pay

## 2021-12-31 ENCOUNTER — Telehealth: Payer: Self-pay

## 2021-12-31 ENCOUNTER — Other Ambulatory Visit (HOSPITAL_COMMUNITY): Payer: Self-pay

## 2021-12-31 NOTE — Telephone Encounter (Signed)
Patient Advocate Encounter   Received notification from [facility] that prior authorization for Trulicity is required by his/her insurance [employer].   PA submitted on 12/31/21  Faxed PA form & chart notes to [employer]  Status is pending    [facility] will continue to follow:  Patient Advocate Fax:  [phone]

## 2022-01-02 ENCOUNTER — Other Ambulatory Visit: Payer: Self-pay

## 2022-01-02 MED ORDER — TRULICITY 3 MG/0.5ML ~~LOC~~ SOAJ: 3.0000 mg | SUBCUTANEOUS | 2 refills | Status: DC

## 2022-01-02 NOTE — Telephone Encounter (Signed)
[facility] would [facility] to see if [facility] can fill since [facility] is on back order at all of them.

## 2022-01-06 ENCOUNTER — Other Ambulatory Visit (HOSPITAL_COMMUNITY): Payer: Self-pay

## 2022-01-06 NOTE — Telephone Encounter (Signed)
Patient Advocate Encounter  Prior Authorization for Trulicity has been approved.    PA# [id number]  Effective dates: 12/01/21 through 01/01/23  RTS  Spoke with Pharmacy to Process.  Patient Advocate Fax: [phone]

## 2022-01-30 ENCOUNTER — Other Ambulatory Visit: Payer: Self-pay | Admitting: Interventional Cardiology

## 2022-02-25 ENCOUNTER — Other Ambulatory Visit: Payer: Self-pay | Admitting: Interventional Cardiology

## 2022-02-25 DIAGNOSIS — E1122 Type 2 diabetes mellitus with diabetic chronic kidney disease: Secondary | ICD-10-CM

## 2022-03-16 ENCOUNTER — Other Ambulatory Visit: Payer: Self-pay

## 2022-03-16 MED ORDER — ATORVASTATIN CALCIUM 80 MG PO TABS: ORAL_TABLET | ORAL | 0 refills | Status: DC

## 2022-03-30 ENCOUNTER — Ambulatory Visit: Payer: BC Managed Care – PPO | Admitting: Interventional Cardiology

## 2022-04-20 ENCOUNTER — Ambulatory Visit: Payer: BC Managed Care – PPO | Admitting: Internal Medicine

## 2022-09-17 ENCOUNTER — Telehealth: Payer: Self-pay | Admitting: Licensed Clinical Social Worker

## 2022-09-17 NOTE — Patient Outreach (Signed)
  Care Coordination   09/17/2022 Name: [name] [name] MRN: [id number] DOB: 26-Oct-1961   Care Coordination Outreach Attempts:  An unsuccessful telephone outreach was attempted today to offer the patient information about available care coordination services as a benefit of their health plan.   Follow Up Plan:  Additional outreach attempts will be made to offer the patient care coordination information and services.   Encounter Outcome:  No Answer  Care Coordination Interventions Activated:  No   Care Coordination Interventions:  No, not indicated    Christa See, MSW, [email or web address] Phone [phone] 4:14 PM

## 2022-09-21 ENCOUNTER — Encounter: Payer: Self-pay | Admitting: Licensed Clinical Social Worker

## 2022-09-21 ENCOUNTER — Telehealth: Payer: Self-pay | Admitting: Licensed Clinical Social Worker

## 2022-09-24 ENCOUNTER — Telehealth: Payer: Self-pay | Admitting: Licensed Clinical Social Worker

## 2022-09-28 NOTE — Patient Outreach (Signed)
  Care Coordination   Follow Up Visit Note   09/28/2022 Name: [name] [name] MRN: [id number] DOB: 06/03/1961  [name] [name] is a 61 y.o. year old male who sees [name] Pepper, MD for primary care. I spoke with  [name] [name] by phone today.  What matters to the patients health and wellness today?  Financial Assistance [name]    Goals Addressed             This Visit's Progress    COMPLETED: Care Coordination Activities       Care Coordination Interventions: Active listening / Reflection utilized  Problem [name] [name] strategies reviewed Pt informed LCSW that he did not receive [name] [name]. LCSW emailed [name] to pt, per request Pt was encouraged to contact LCSW with any questions or concerns         SDOH assessments and interventions completed:  No     Care Coordination Interventions Activated:  Yes  Care Coordination Interventions:  Yes, provided   Follow up plan: No further intervention required.   Encounter Outcome:  Pt. Visit Completed   [name] [name], MSW, [name] Phone [phone] 3:51 PM

## 2022-09-28 NOTE — Patient Instructions (Signed)
Visit Information  Thank you for taking time to visit with me today. Please don't hesitate to contact me if I can be of assistance to you.   Following are the goals we discussed today:   Goals Addressed             This Visit's Progress    Care Coordination Activities   On track    Care Coordination Interventions: Active listening / Reflection utilized  Problem Solving [place] reviewed LCSW informed patient of care coordination services. Pt reports he can not afford to visit with PCP due to loss of insurance and inability to pay for bloodwork to obtain medication LCSW informed patient of Water quality scientist and agreed to e-mail application         If you are experiencing a Mental Health or [place] or need someone to talk to, please call the Suicide and Crisis Lifeline: 988 call 911   Patient verbalizes understanding of instructions and care plan provided today and agrees to view in [place]. Active MyChart status and patient understanding of how to access instructions and care plan via MyChart confirmed with patient.     Christa See, MSW, [email or web address] Phone [phone] 3:44 PM

## 2022-09-28 NOTE — Patient Instructions (Signed)
Visit Information  Thank you for taking time to visit with me today. Please don't hesitate to contact me if I can be of assistance to you.   Following are the goals we discussed today:   Goals Addressed             This Visit's Progress    COMPLETED: Care Coordination Activities       Care Coordination Interventions: Active listening / Reflection utilized  Problem [place] strategies reviewed Pt informed LCSW that he did not receive [employer] Application. LCSW emailed application to pt, per request Pt was encouraged to contact LCSW with any questions or concerns         If you are experiencing a Mental Health or [place] or need someone to talk to, please call the Suicide and Crisis Lifeline: 988 call 911   Patient verbalizes understanding of instructions and care plan provided today and agrees to view in [place]. Active MyChart status and patient understanding of how to access instructions and care plan via MyChart confirmed with patient.     No further follow up required:    Christa See, MSW, [email or web address] Phone [phone] 3:52 PM

## 2022-09-28 NOTE — Patient Outreach (Signed)
  Care Coordination   Initial Visit Note   09/28/2022 Name: [name] [name] MRN: [id number] DOB: March 03, 1961  [name] [name] is a 61 y.o. year old male who sees London Pepper, MD for primary care. I spoke with  [name] by phone today.  What matters to the patients health and wellness today?  Care Coordination/Financial Assistance    Goals Addressed             This Visit's Progress    Care Coordination Activities   On track    Care Coordination Interventions: Active listening / Reflection utilized  Problem Solving [place] reviewed LCSW informed patient of care coordination services. Pt reports he can not afford to visit with PCP due to loss of insurance and inability to pay for bloodwork to obtain medication LCSW informed patient of Water quality scientist and agreed to e-mail application          SDOH assessments and interventions completed:  No     Care Coordination Interventions Activated:  Yes  Care Coordination Interventions:  Yes, provided   Follow up plan: No further intervention required.   Encounter Outcome:  Pt. Visit Completed   Christa See, MSW, [email or web address] Phone [phone] 3:43 PM

## 2023-03-08 ENCOUNTER — Emergency Department (HOSPITAL_COMMUNITY)
Admission: EM | Admit: 2023-03-08 | Discharge: 2023-03-08 | Disposition: A | Discharge status: Home / Self Care | Payer: Medicaid Other | Attending: Emergency Medicine | Admitting: Emergency Medicine

## 2023-03-08 ENCOUNTER — Other Ambulatory Visit: Payer: Self-pay

## 2023-03-08 ENCOUNTER — Encounter (HOSPITAL_COMMUNITY): Payer: Self-pay

## 2023-03-08 ENCOUNTER — Emergency Department (HOSPITAL_COMMUNITY): Payer: Medicaid Other

## 2023-03-08 DIAGNOSIS — Z7984 Long term (current) use of oral hypoglycemic drugs: Secondary | ICD-10-CM | POA: Diagnosis not present

## 2023-03-08 DIAGNOSIS — Z794 Long term (current) use of insulin: Secondary | ICD-10-CM | POA: Diagnosis not present

## 2023-03-08 DIAGNOSIS — U071 COVID-19: Secondary | ICD-10-CM | POA: Diagnosis not present

## 2023-03-08 DIAGNOSIS — E1165 Type 2 diabetes mellitus with hyperglycemia: Secondary | ICD-10-CM | POA: Insufficient documentation

## 2023-03-08 DIAGNOSIS — R739 Hyperglycemia, unspecified: Secondary | ICD-10-CM

## 2023-03-08 DIAGNOSIS — R059 Cough, unspecified: Secondary | ICD-10-CM | POA: Diagnosis present

## 2023-03-08 DIAGNOSIS — I10 Essential (primary) hypertension: Secondary | ICD-10-CM | POA: Insufficient documentation

## 2023-03-08 DIAGNOSIS — Z79899 Other long term (current) drug therapy: Secondary | ICD-10-CM | POA: Diagnosis not present

## 2023-03-08 DIAGNOSIS — R531 Weakness: Secondary | ICD-10-CM

## 2023-03-08 LAB — URINALYSIS, ROUTINE W REFLEX MICROSCOPIC
Bacteria, UA: NONE SEEN
Bilirubin Urine: NEGATIVE
Glucose, UA: >=500 mg/dL — AB
Hgb urine dipstick: NEGATIVE
Ketones, ur: 20 mg/dL — AB
Leukocytes,Ua: NEGATIVE
Nitrite: NEGATIVE
Protein, ur: NEGATIVE mg/dL
Specific Gravity, Urine: 1.031 — ABNORMAL HIGH (ref 1.005–1.030)
pH: 6 (ref 5.0–8.0)

## 2023-03-08 LAB — BASIC METABOLIC PANEL
Anion gap: 12 (ref 5–15)
BUN: 19 mg/dL (ref 6–20)
CO2: 24 mmol/L (ref 22–32)
Calcium: 9.4 mg/dL (ref 8.9–10.3)
Chloride: 94 mmol/L — ABNORMAL LOW (ref 98–111)
Creatinine, Ser: 1.13 mg/dL (ref 0.61–1.24)
GFR, Estimated: >60 mL/min (ref >60)
Glucose, Bld: 686 mg/dL (ref 70–99)
Potassium: 3.9 mmol/L (ref 3.5–5.1)
Sodium: 130 mmol/L — ABNORMAL LOW (ref 135–145)

## 2023-03-08 LAB — TROPONIN I (HIGH SENSITIVITY)
Troponin I (High Sensitivity): 8 ng/L (ref <18)
Troponin I (High Sensitivity): 7 ng/L (ref <18)

## 2023-03-08 LAB — CBC
HCT: 46.9 % (ref 39.0–52.0)
Hemoglobin: 16.3 g/dL (ref 13.0–17.0)
MCH: 31.7 pg (ref 26.0–34.0)
MCHC: 34.8 g/dL (ref 30.0–36.0)
MCV: 91.2 fL (ref 80.0–100.0)
Platelets: 178 10*3/uL (ref 150–400)
RBC: 5.14 MIL/uL (ref 4.22–5.81)
RDW: 12.6 % (ref 11.5–15.5)
WBC: 5 10*3/uL (ref 4.0–10.5)
nRBC: 0 % (ref 0.0–0.2)

## 2023-03-08 LAB — RESP PANEL BY RT-PCR (RSV, FLU A&B, COVID)  RVPGX2
Influenza A by PCR: NEGATIVE
Influenza B by PCR: NEGATIVE
Resp Syncytial Virus by PCR: NEGATIVE
SARS Coronavirus 2 by RT PCR: POSITIVE — AB

## 2023-03-08 MED ORDER — INSULIN ASPART 100 UNIT/ML IJ SOLN
10.0000 [IU] | Freq: Once | INTRAMUSCULAR | Status: AC
  Administered 2023-03-08: 10 [IU] via SUBCUTANEOUS

## 2023-03-08 MED ORDER — LACTATED RINGERS IV BOLUS
1000.0000 mL | Freq: Once | INTRAVENOUS | Status: AC
  Administered 2023-03-08: 1000 mL via INTRAVENOUS

## 2023-03-08 NOTE — ED Triage Notes (Signed)
Pt reports congestion & cough that started 2 days ago  and onset of chest pain today. He reports feeling weak and has a lack of energy.

## 2023-03-08 NOTE — ED Notes (Signed)
Pt d/c home per MD order. Discharge summary reviewed with pt, pt verbalizes understanding. Ambulatory off unit. No s/s of acute distress noted at discharge.  °

## 2023-03-08 NOTE — Discharge Instructions (Addendum)
Your blood glucose level was elevated today at 686.  You were given dose of insulin as a fluid in the emergency department.  He also did test positive for COVID.  You should follow-up at the clinic as we discussed on the 28th.  If you experience loss of consciousness, chest pain, shortness of breath, intractable vomiting or inability tolerate food or fluid, fever please return to the emergency department.

## 2023-03-08 NOTE — ED Provider Notes (Signed)
[facility] Provider Note   CSN: [id number] Arrival date & time: 03/08/23  1429     History {Add pertinent medical, surgical, social history, OB history to HPI:1} Chief Complaint  Patient presents with   Weakness    [name] [name] is a 62 y.o. male.   Weakness      Home Medications Prior to Admission medications   Medication Sig Start Date End Date Taking? Authorizing Provider  hydrALAZINE (APRESOLINE) 25 MG tablet TAKE 1 TABLET(25 MG) BY MOUTH THREE TIMES DAILY 02/26/22   Belva Crome, MD  amLODipine (NORVASC) 10 MG tablet TAKE 1 TABLET(10 MG) BY MOUTH DAILY 02/02/22   Belva Crome, MD  atorvastatin (LIPITOR) 80 MG tablet TAKE 1 TABLET(80 MG) BY MOUTH AT BEDTIME 03/16/22   Belva Crome, MD  carvedilol (COREG) 12.5 MG tablet TAKE 1 TABLET BY MOUTH TWICE DAILY. HOLD IF BLOOD PRESSURE LESS THAN 100MMHG 02/12/21   Belva Crome, MD  cetirizine (ZYRTEC) 10 MG tablet Take 1 tablet (10 mg total) by mouth daily. 06/28/20   Henson, Vickie L, NP-C  Dulaglutide (TRULICITY) 3 0000000 SOPN Inject 3 mg as directed once a week. 01/02/22   Shamleffer, Melanie Crazier, MD  empagliflozin (JARDIANCE) 25 MG TABS tablet Take 1 tablet (25 mg total) by mouth daily. 12/15/21   Shamleffer, Melanie Crazier, MD  fluticasone (FLONASE) 50 MCG/ACT nasal spray Place 2 sprays into both nostrils daily. 03/06/20   [provider]  insulin glargine, 2 Unit Dial, (TOUJEO MAX SOLOSTAR) 300 UNIT/ML Solostar Pen Inject 100 Units into the skin daily. 12/15/21   Shamleffer, Melanie Crazier, MD  Insulin Pen Needle (BD PEN NEEDLE NANO U/F) 32G X 4 MM MISC 1 each by Does not apply route at bedtime. 02/29/20   Tysinger, Camelia Eng, PA-C  lisinopril (ZESTRIL) 40 MG tablet TAKE 1 TABLET(40 MG) BY MOUTH DAILY 02/12/21   Belva Crome, MD  Needles & Syringes MISC As directed 20m of testosterone every 14 days 02/29/20   Tysinger, DCamelia Eng PA-C  rOPINIRole (REQUIP) 0.5 MG tablet Take  1 tablet by mouth daily. 11/17/21   [provider]  tamsulosin (FLOMAX) 0.4 MG CAPS capsule Take 1 capsule (0.4 mg total) by mouth daily. 03/19/21   Tysinger, DCamelia Eng PA-C  Testosterone 20.25 MG/ACT (1.62%) GEL Place 81 mg onto the skin daily in the afternoon. 2 pumps to each shoulder 11/11/21   Shamleffer, IMelanie Crazier MD  TRINTELLIX 20 MG TABS tablet TAKE 1 TABLET(20 MG) BY MOUTH DAILY 12/04/20   Tysinger, DCamelia Eng PA-C      Allergies    Pravastatin, Trazodone and nefazodone, Hydrochlorothiazide, and Metformin and related    Review of Systems   Review of Systems  Neurological:  Positive for weakness.    Physical Exam Updated Vital Signs BP 128/76 (BP Location: Right Arm)   Pulse 82   Temp 97.9 F (36.6 C) (Oral)   Resp 18   Ht '5\' 10"'$  (1.778 m)   Wt 80.7 kg   SpO2 98%   BMI 25.54 kg/m  Physical Exam  ED Results / Procedures / Treatments   Labs (all labs ordered are listed, but only abnormal results are displayed) Labs Reviewed  RESP PANEL BY RT-PCR (RSV, FLU A&B, COVID)  RVPGX2 - Abnormal; Notable for the following components:      Result Value   SARS Coronavirus 2 by RT PCR POSITIVE (*)    All other components within normal limits  BASIC METABOLIC PANEL - Abnormal; Notable for the following components:   Sodium 130 (*)    Chloride 94 (*)    Glucose, Bld 686 (*)    All other components within normal limits  URINALYSIS, ROUTINE W REFLEX MICROSCOPIC - Abnormal; Notable for the following components:   Color, Urine STRAW (*)    Specific Gravity, Urine 1.031 (*)    Glucose, UA >=500 (*)    Ketones, ur 20 (*)    All other components within normal limits  CBC  TROPONIN I (HIGH SENSITIVITY)  TROPONIN I (HIGH SENSITIVITY)    EKG None  Radiology DG Chest 2 View  Result Date: 03/08/2023 CLINICAL DATA:  Chest pain EXAM: CHEST - 2 VIEW COMPARISON:  03/26/2015 chest radiograph FINDINGS: The cardiomediastinal silhouette is unremarkable. There is no evidence of  focal airspace disease, pulmonary edema, suspicious pulmonary nodule/mass, pleural effusion, or pneumothorax. No acute bony abnormalities are identified. IMPRESSION: No active cardiopulmonary disease. Electronically Signed   By: Margarette Canada M.D.   On: 03/08/2023 16:24    Procedures Procedures  {Document cardiac monitor, telemetry assessment procedure when appropriate:1}  Medications Ordered in ED Medications - No data to display  ED Course/ Medical Decision Making/ A&P   {   Click here for ABCD2, HEART and other calculatorsREFRESH Note before signing :1}                          Medical Decision Making  ***  {Document critical care time when appropriate:1} {Document review of labs and clinical decision tools ie heart score, Chads2Vasc2 etc:1}  {Document your independent review of radiology images, and any outside records:1} {Document your discussion with family members, caretakers, and with consultants:1} {Document social determinants of health affecting pt's care:1} {Document your decision making why or why not admission, treatments were needed:1} Final Clinical Impression(s) / ED Diagnoses Final diagnoses:  None    Rx / DC Orders ED Discharge Orders     None

## 2023-03-08 NOTE — ED Provider Triage Note (Signed)
Emergency Medicine Provider Triage Evaluation Note  [name] [name] , a 62 y.o. male  was evaluated in triage.  Pt complains of fatigue, chest pain, body aches, chills, cough onset yesterday.  No changes in bowel or bladder, no sick contacts  Review of Systms  Positive:  Negative:   Physical Exam  There were no vitals taken for this visit. Gen:   Awake, no distress   Resp:  Normal effort  MSK:   Moves extremities without difficulty  Other:  Gait intact  Medical Decision Making  Medically screening exam initiated at 3:40 PM.  Appropriate orders placed.  Jevaun Tippens was informed that the remainder of the evaluation will be completed by another provider, this initial triage assessment does not replace that evaluation, and the importance of remaining in the ED until their evaluation is complete.     Tacy Learn, PA-C 03/08/23 1541

## 2023-03-08 NOTE — ED Provider Notes (Signed)
I saw and evaluated the patient, reviewed the resident's note and I agree with the findings and plan.   62 year old male presents with cough congestion.  History of diabetes and has been noncompliant with his medications for over a year.  Here he is hyperglycemic.  No evidence of DKA.  Will give IV fluids, insulin.  Will place back on metformin.  He has an appointment to see his doctor in 2 weeks   Lacretia Leigh, MD 03/08/23 2013

## 2023-03-25 ENCOUNTER — Ambulatory Visit: Payer: Medicaid Other

## 2023-03-25 ENCOUNTER — Telehealth: Payer: Self-pay | Admitting: *Deleted

## 2023-03-25 ENCOUNTER — Other Ambulatory Visit (HOSPITAL_COMMUNITY): Payer: Self-pay

## 2023-03-25 VITALS — BP 120/76 | HR 90 | Temp 97.4°F | Ht 70.0 in | Wt 181.0 lb

## 2023-03-25 DIAGNOSIS — Z794 Long term (current) use of insulin: Secondary | ICD-10-CM | POA: Diagnosis not present

## 2023-03-25 DIAGNOSIS — E1165 Type 2 diabetes mellitus with hyperglycemia: Secondary | ICD-10-CM | POA: Diagnosis present

## 2023-03-25 DIAGNOSIS — I1A0 Resistant hypertension: Secondary | ICD-10-CM | POA: Diagnosis not present

## 2023-03-25 DIAGNOSIS — F32A Depression, unspecified: Secondary | ICD-10-CM | POA: Diagnosis not present

## 2023-03-25 DIAGNOSIS — F419 Anxiety disorder, unspecified: Secondary | ICD-10-CM

## 2023-03-25 DIAGNOSIS — R4589 Other symptoms and signs involving emotional state: Secondary | ICD-10-CM | POA: Diagnosis not present

## 2023-03-25 LAB — BASIC METABOLIC PANEL
Anion gap: 12 (ref 5–15)
BUN: 22 mg/dL — ABNORMAL HIGH (ref 6–20)
CO2: 25 mmol/L (ref 22–32)
Calcium: 9.5 mg/dL (ref 8.9–10.3)
Chloride: 91 mmol/L — ABNORMAL LOW (ref 98–111)
Creatinine, Ser: 1.07 mg/dL (ref 0.61–1.24)
GFR, Estimated: >60 mL/min (ref >60)
Glucose, Bld: 683 mg/dL (ref 70–99)
Potassium: 4.7 mmol/L (ref 3.5–5.1)
Sodium: 128 mmol/L — ABNORMAL LOW (ref 135–145)

## 2023-03-25 LAB — GLUCOSE, CAPILLARY: Glucose-Capillary: >600 mg/dL (ref 70–99)

## 2023-03-25 LAB — POCT GLYCOSYLATED HEMOGLOBIN (HGB A1C): HbA1c POC (<> result, manual entry): >14 % — AB (ref 4.0–5.6)

## 2023-03-25 MED ORDER — LEVEMIR FLEXPEN 100 UNIT/ML ~~LOC~~ SOPN
40.0000 [IU] | PEN_INJECTOR | Freq: Every day | SUBCUTANEOUS | 11 refills | Status: DC
  Filled 2023-03-25: qty 12, 30d supply, fill #0
  Filled 2023-03-25: qty 10, 25d supply, fill #0

## 2023-03-25 MED ORDER — INSULIN LISPRO (1 UNIT DIAL) 100 UNIT/ML (KWIKPEN)
10.0000 [IU] | PEN_INJECTOR | Freq: Three times a day (TID) | SUBCUTANEOUS | 0 refills | Status: DC
  Filled 2023-03-25: qty 9, 30d supply, fill #0

## 2023-03-25 NOTE — Telephone Encounter (Addendum)
CRITICAL VALUE STICKER  CRITICAL VALUE: Glucose 683  RECEIVER (on-site recipient of call): L. Larri Yehle, BSN, RN-BC   DATE & TIME NOTIFIED: 03/25/23 @ 1328  MESSENGER (representative from lab): Leah from [facility]  MD NOTIFIED: Dr. Dema Severin  TIME OF NOTIFICATION: 1330  RESPONSE: Provider was aware that CBG was > 600 at today's office visit.

## 2023-03-25 NOTE — Assessment & Plan Note (Signed)
Patient with history of hypertension.  Previously prescribed amlodipine, carvedilol, and hydralazine.  However, today his blood pressure was 120/76 after not having had medicines for several days. -Given financial constraints and normal pressure today, will hold off on refilling antihypertensives

## 2023-03-25 NOTE — Patient Instructions (Signed)
[name], it was a pleasure seeing you today!  Today we discussed: Diabetes - take Levemir 40 u once daily. Take Humalog 10 u 3 times daily before meals. Take your sugar in the morning and 30 mins before each meal. Please schedule an appointment with [place] for early next week.  I have ordered the following labs today:   Lab Orders         Glucose, capillary         BMP w Anion Gap (STAT/Sunquest-performed on-site)         POC Hbg A1C      Tests ordered today:  none  Referrals ordered today:   Referral Orders  No referral(s) requested today     I have ordered the following medication/changed the following medications:   Stop the following medications: Medications Discontinued During This Encounter  Medication Reason   insulin glargine, 2 Unit Dial, (TOUJEO MAX SOLOSTAR) 300 UNIT/ML Solostar Pen Change in therapy     Start the following medications: Meds ordered this encounter  Medications   insulin detemir (LEVEMIR) 100 UNIT/ML injection    Sig: Inject 0.4 mLs (40 Units total) into the skin daily.    Dispense:  10 mL    Refill:  11   insulin lispro (HUMALOG) 100 UNIT/ML injection    Dispense:  10 mL    Refill:  0     Follow-up:  early next week    Please make sure to arrive 15 minutes prior to your next appointment. If you arrive late, you may be asked to reschedule.   We look forward to seeing you next time. Please call our clinic at [phone] if you have any questions or concerns. The best time to call is Monday-Friday from 9am-4pm, but there is someone available 24/7. If after hours or the weekend, call the main hospital number and ask for the Internal Medicine Resident On-Call. If you need medication refills, please notify your pharmacy one week in advance and they will send [place] a request.  Thank you for letting us take part in your care. Wishing you the best!  Thank you, Linward Natal, MD

## 2023-03-25 NOTE — Assessment & Plan Note (Addendum)
Patient presents with history of T2DM. A1c >14 today. Glucose >600. Glucose 405 this morning by his glucometer. Typically mid 300s to low 400s. Endorses polydipsia, polyuria. Endorses weight loss. Lost 40 lbs over past year since not having PCP/taking meds. Endorses abdominal pain - sharp pains he says are from gas. Denies nausea, vomiting. Endorses "horrible gas." No new vision changes. Endorses leg weakness and balance issues since covid several weeks ago.  Do not suspect HHS at this time. -Start Levemir 40 units daily -Start Humalog 10 units before meals (3 times daily) -Instructed patient to take a fasting sugar and preprandial sugars -Repeat BMP -Return early next week to titrate medications

## 2023-03-25 NOTE — Progress Notes (Signed)
CC: Establish care  HPI:  [name] is a 62 y.o. with past medical history who presents to establish care.  See detailed assessment and plan for HPI.  Medical hx DMII Sleep Apnea Hypertension Hyperlipidema Low testosterone Depression Enlarged prostate  Hx iliac fusion, knee arthroscopy   Social hx Lives in house alone. Unemployed, used to be bus driver. 3 brothers No children  Family hx Father - diabetes, prostate cancer Mother - lung cancer, smoker Older brother with melanoma, died from heart attack Younger brother with diabetes   Past Medical History:  Diagnosis Date   Anxiety    Coronary artery disease    Depression    Diabetes mellitus    Type 2   Enlarged prostate    Environmental allergies    Environmental and seasonal allergies    GERD (gastroesophageal reflux disease)    Headache(784.0)    sinus and migraines   Hyperlipidemia    Hypertension    IBS (irritable bowel syndrome)    Low testosterone    Renal artery stenosis (HCC)    Sleep apnea    has used BiPAP in past; last sleep study >10 years   Review of Systems: See detailed assessment and plan for pertinent ROS.  Physical Exam:  Vitals:   03/25/23 1032  BP: 120/76  Pulse: 90  Temp: (!) 97.4 F (36.3 C)  TempSrc: Oral  SpO2: 99%  Weight: 181 lb (82.1 kg)  Height: 5\' 10"  (1.778 m)   Physical Exam Constitutional:      General: He is not in acute distress. HENT:     Head: Normocephalic and atraumatic.     Mouth/Throat:     Mouth: Mucous membranes are dry.  Eyes:     Extraocular Movements: Extraocular movements intact.  Cardiovascular:     Rate and Rhythm: Normal rate and regular rhythm.     Heart sounds: No murmur heard. Pulmonary:     Effort: Pulmonary effort is normal.     Breath sounds: No wheezing, rhonchi or rales.  Abdominal:     General: Bowel sounds are normal.     Palpations: Abdomen is soft.     Tenderness: There is no abdominal tenderness.   Musculoskeletal:        General: Normal range of motion.     Cervical back: Normal range of motion.  Skin:    General: Skin is warm and dry.  Neurological:     General: No focal deficit present.     Mental Status: He is alert and oriented to person, place, and time.     Sensory: No sensory deficit.     Motor: No weakness.  Psychiatric:        Mood and Affect: Mood normal.        Behavior: Behavior normal.      Assessment & Plan:   See Encounters Tab for problem based charting.  Type 2 diabetes mellitus with hyperglycemia, with long-term current use of insulin [facility]) Patient presents with history of T2DM. A1c >14 today. Glucose >600. Glucose 405 this morning by his glucometer. Typically mid 300s to low 400s. Endorses polydipsia, polyuria. Endorses weight loss. Lost 40 lbs over past year since not having PCP/taking meds. Endorses abdominal pain - sharp pains he says are from gas. Denies nausea, vomiting. Endorses "horrible gas." No new vision changes. Endorses leg weakness and balance issues since covid several weeks ago.  Do not suspect HHS at this time. -Start Levemir 40 units daily -Start Humalog 10 units  before meals (3 times daily) -Instructed patient to take a fasting sugar and preprandial sugars -Repeat BMP -Return early next week to titrate medications  Depressed mood Patient reports long history of anxiety and depression previously medicated with Trintellix and, before that, SSRIs.  States he was unable to tolerate SSRI side effects.  Unable to address in much depth today. -Return to clinic early next week to discuss symptoms further  -Referral for counseling  Resistant hypertension Patient with history of hypertension.  Previously prescribed amlodipine, carvedilol, and hydralazine.  However, today his blood pressure was 120/76 after not having had medicines for several days. -Given financial constraints and normal pressure today, will hold off on refilling  antihypertensives    Patient discussed with Dr. Philipp Ovens

## 2023-03-25 NOTE — Assessment & Plan Note (Signed)
Patient reports long history of anxiety and depression previously medicated with Trintellix and, before that, SSRIs.  States he was unable to tolerate SSRI side effects.  Unable to address in much depth today. -Return to clinic early next week to discuss symptoms further  -Referral for counseling

## 2023-03-25 NOTE — Progress Notes (Signed)
Provided this patient with a sample Accuchek guide meter, 100 strips and Accu chek softclicks lancets today. He states he knows how to use the meter and supplies. He will need prescriptions for the strips and lancets once this supply runs out.  Debera Lat, RD 03/25/2023 11:50 AM.

## 2023-03-26 ENCOUNTER — Other Ambulatory Visit (HOSPITAL_COMMUNITY): Payer: Self-pay

## 2023-03-30 NOTE — Progress Notes (Signed)
Internal Medicine Clinic Attending  Case discussed with Dr. White  At the time of the visit.  We reviewed the resident's history and exam and pertinent patient test results.  I agree with the assessment, diagnosis, and plan of care documented in the resident's note.  

## 2023-04-08 ENCOUNTER — Ambulatory Visit (INDEPENDENT_AMBULATORY_CARE_PROVIDER_SITE_OTHER): Payer: Medicaid Other

## 2023-04-08 ENCOUNTER — Other Ambulatory Visit (HOSPITAL_COMMUNITY): Payer: Self-pay

## 2023-04-08 VITALS — BP 151/88 | HR 98 | Temp 98.0°F | Ht 70.0 in | Wt 265.4 lb

## 2023-04-08 DIAGNOSIS — G8929 Other chronic pain: Secondary | ICD-10-CM

## 2023-04-08 DIAGNOSIS — R519 Headache, unspecified: Secondary | ICD-10-CM

## 2023-04-08 DIAGNOSIS — Z794 Long term (current) use of insulin: Secondary | ICD-10-CM | POA: Diagnosis not present

## 2023-04-08 DIAGNOSIS — F419 Anxiety disorder, unspecified: Secondary | ICD-10-CM

## 2023-04-08 DIAGNOSIS — E1165 Type 2 diabetes mellitus with hyperglycemia: Secondary | ICD-10-CM

## 2023-04-08 DIAGNOSIS — E785 Hyperlipidemia, unspecified: Secondary | ICD-10-CM | POA: Diagnosis not present

## 2023-04-08 DIAGNOSIS — F32A Depression, unspecified: Secondary | ICD-10-CM | POA: Diagnosis not present

## 2023-04-08 DIAGNOSIS — I1A0 Resistant hypertension: Secondary | ICD-10-CM | POA: Diagnosis not present

## 2023-04-08 MED ORDER — ROPINIROLE HCL 0.5 MG PO TABS
1.0000 mg | ORAL_TABLET | Freq: Every day | ORAL | 0 refills | Status: DC
  Filled 2023-04-08: qty 60, 30d supply, fill #0

## 2023-04-08 MED ORDER — OLMESARTAN MEDOXOMIL 5 MG PO TABS
5.0000 mg | ORAL_TABLET | Freq: Every day | ORAL | 11 refills | Status: DC
  Filled 2023-04-08: qty 30, 30d supply, fill #0

## 2023-04-08 NOTE — Progress Notes (Signed)
Internal Medicine Clinic Attending  Case discussed with Dr. Cliffton Asters  At the time of the visit.  We reviewed the resident's history and exam and pertinent patient test results.  I agree with the assessment, diagnosis, and plan of care documented in the resident's note.    Patient's medical care is currently limited by his inability to afford his medicines. I agree that the most pressing issue today is the T2DM with hyperglycemia, and I agree with increasing insulin as outlined in the resident note.

## 2023-04-08 NOTE — Assessment & Plan Note (Signed)
Patient presents for follow-up of his hypertension.  His blood pressure is 151/88 today.  Blood pressure last visit was 120/76.  Blood pressure at home has typically been in the 140s systolic for him.  He endorses headaches but denies chest pain, vision changes, shortness of breath. -Start olmesartan -Follow-up in 2 weeks for blood pressure recheck and BMP

## 2023-04-08 NOTE — Progress Notes (Signed)
CC: 1 week follow-up  HPI:  [name] is a 62 y.o. with past medical history as below who presents for 1 week follow-up.  See detailed assessment and plan for HPI.  Past Medical History:  Diagnosis Date   Anxiety    Coronary artery disease    Depression    Diabetes mellitus    Type 2   Enlarged prostate    Environmental allergies    Environmental and seasonal allergies    GERD (gastroesophageal reflux disease)    Headache(784.0)    sinus and migraines   Hyperlipidemia    Hypertension    IBS (irritable bowel syndrome)    Low testosterone    Renal artery stenosis (HCC)    Sleep apnea    has used BiPAP in past; last sleep study >10 years   Review of Systems: See detailed assessment and plan for pertinent ROS.  Physical Exam:  Vitals:   04/08/23 1503  BP: (!) 151/88  Pulse: 98  Temp: 98 F (36.7 C)  TempSrc: Oral  SpO2: 98%  Weight: 265 lb 6.4 oz (120.4 kg)  Height: 5\' 10"  (1.778 m)   Physical Exam Constitutional:      General: He is not in acute distress. HENT:     Head: Normocephalic and atraumatic.  Eyes:     Extraocular Movements: Extraocular movements intact.  Cardiovascular:     Rate and Rhythm: Normal rate and regular rhythm.     Heart sounds: No murmur heard. Pulmonary:     Effort: Pulmonary effort is normal.     Breath sounds: No wheezing, rhonchi or rales.  Musculoskeletal:     Cervical back: Neck supple.     Right lower leg: No edema.     Left lower leg: No edema.  Skin:    General: Skin is warm and dry.  Neurological:     General: No focal deficit present.     Mental Status: He is alert and oriented to person, place, and time.     Sensory: No sensory deficit.     Motor: No weakness.  Psychiatric:        Mood and Affect: Mood normal.        Behavior: Behavior normal.      Assessment & Plan:   See Encounters Tab for problem based charting.  Anxiety and depression Endorses history of anxiety and depression. Feels like he's  always on edge. Endorses sleep difficulty. Endorses nightmares (feels like he's reliving prior experiences). Denies SI, HI, AVH. Previously on Trintellex 20 mg with partial relief.  Tried multiple other SSRIs before Trintellix. he has a visit with SW scheduled for counseling. -Discuss further at next follow-up, Trintellix likely cost prohibitive.  Could consider trialing other SSRIs if he has not tried one of them -Visit with social work for counseling next week schedule  Resistant hypertension Patient presents for follow-up of his hypertension.  His blood pressure is 151/88 today.  Blood pressure last visit was 120/76.  Blood pressure at home has typically been in the 140s systolic for him.  He endorses headaches but denies chest pain, vision changes, shortness of breath. -Start olmesartan -Follow-up in 2 weeks for blood pressure recheck and BMP  Type 2 diabetes mellitus with hyperglycemia, with long-term current use of insulin [facility]) Patient presents today for follow-up of his type 2 diabetes.  Upon review of readings on glucometer, still severely elevated in the 400s to 500s.  Patient decided to titrate his Levemir from 40 to 36  units daily and his Humalog from 10 to 20 units daily with meals.  His glucose is still severely elevated despite this.  Will continue to increase today.  Denies any symptoms of hypoglycemia. -Increase Levemir to 44 units twice daily -Increase Humalog to 22 units with meals -Discussed writing down fasting sugars  -Discussed with pharmacy and they will try to put medications on charge account  Hyperlipidemia Unable to discuss hyperlipidemia today given time constraint.  Patient would benefit from statin therapy given his diabetes, age, and lipid profile. -Discussed statin therapy at next follow-up  Headache Patient presents with long history of headaches.  He reports taking aspirin in the past with these.  Last week, he experienced a intense pain in the back of his  scalp that lasted for about 2 days.  He denies any vision changes, weakness, loss of sensation, facial asymmetry to his appreciation.  The symptoms completely resolved.  He has since had an off-and-on headache at the top of his scalp.  Also endorses history of sinus headaches in the frontal sinus area.  He endorses multimodality approach to sinuses and I feel that the only logical neck step would be an ENT referral though it would likely have difficulty given financial constraints.  I feel his headaches are most likely tension type.  Normal neuroexam today. -Encourage patient to stop taking aspirin for his headaches and instead take ibuprofen or Tylenol -Keep headache journal -Discussed at follow-up    Patient discussed with Dr.  Lafonda Mosses

## 2023-04-08 NOTE — Assessment & Plan Note (Addendum)
Patient presents with long history of headaches.  He reports taking aspirin in the past with these.  Last week, he experienced a intense pain in the back of his scalp that lasted for about 2 days.  He denies any vision changes, weakness, loss of sensation, facial asymmetry to his appreciation.  The symptoms completely resolved.  He has since had an off-and-on headache at the top of his scalp.  Also endorses history of sinus headaches in the frontal sinus area.  He endorses multimodality approach to sinuses and I feel that the only logical neck step would be an ENT referral though it would likely have difficulty given financial constraints.  I feel his headaches are most likely tension type.  Normal neuroexam today. -Encourage patient to stop taking aspirin for his headaches and instead take ibuprofen or Tylenol -Keep headache journal -Discussed at follow-up

## 2023-04-08 NOTE — Assessment & Plan Note (Addendum)
Endorses history of anxiety and depression. Feels like he's always on edge. Endorses sleep difficulty. Endorses nightmares (feels like he's reliving prior experiences). Denies SI, HI, AVH. Previously on Trintellex 20 mg with partial relief.  Tried multiple other SSRIs before Trintellix. he has a visit with SW scheduled for counseling. -Discuss further at next follow-up, Trintellix likely cost prohibitive.  Could consider trialing other SSRIs if he has not tried one of them -Visit with social work for counseling next week schedule

## 2023-04-08 NOTE — Assessment & Plan Note (Signed)
Patient presents today for follow-up of his type 2 diabetes.  Upon review of readings on glucometer, still severely elevated in the 400s to 500s.  Patient decided to titrate his Levemir from 40 to 80 units daily and his Humalog from 10 to 20 units daily with meals.  His glucose is still severely elevated despite this.  Will continue to increase today.  Denies any symptoms of hypoglycemia. -Increase Levemir to 44 units twice daily -Increase Humalog to 22 units with meals -Discussed writing down fasting sugars  -Discussed with pharmacy and they will try to put medications on charge account

## 2023-04-08 NOTE — Patient Instructions (Addendum)
[name], it was a pleasure seeing you today!  Today we discussed: Diabetes - Levimir 44 u twice daily. Humalog 22 u three times daily with meals.  High blood pressure - Take olmesartan 5 mg once daily   I have ordered the following labs today:  Lab Orders  No laboratory test(s) ordered today     Tests ordered today:  none  Referrals ordered today:   Referral Orders  No referral(s) requested today     I have ordered the following medication/changed the following medications:   Stop the following medications: There are no discontinued medications.   Start the following medications: No orders of the defined types were placed in this encounter.    Follow-up:  2 weeks    Please make sure to arrive 15 minutes prior to your next appointment. If you arrive late, you may be asked to reschedule.   We look forward to seeing you next time. Please call our clinic at [phone] if you have any questions or concerns. The best time to call is Monday-Friday from 9am-4pm, but there is someone available 24/7. If after hours or the weekend, call the main hospital number and ask for the Internal Medicine Resident On-Call. If you need medication refills, please notify your pharmacy one week in advance and they will send [place] a request.  Thank you for letting us take part in your care. Wishing you the best!  Thank you, Adron Bene, MD

## 2023-04-08 NOTE — Assessment & Plan Note (Signed)
Unable to discuss hyperlipidemia today given time constraint.  Patient would benefit from statin therapy given his diabetes, age, and lipid profile. -Discussed statin therapy at next follow-up

## 2023-04-13 ENCOUNTER — Ambulatory Visit (INDEPENDENT_AMBULATORY_CARE_PROVIDER_SITE_OTHER): Payer: Medicaid Other | Admitting: Licensed Clinical Social Worker

## 2023-04-13 DIAGNOSIS — R4589 Other symptoms and signs involving emotional state: Secondary | ICD-10-CM

## 2023-04-13 NOTE — BH Specialist Note (Signed)
[facility] introduced self and explained role.   Patient denied [facility] services. Patient wanted financial assistance.   [facility] had no additional resources for Medications. Patient stated he was denied [facility] financial assistance due to having medicaid. [facility] unaware of any additional agencies  assisting at this time.   Patient was previously offered assistance with [facility]. LCSW J .Lewis  sent an application to assist patient and patient admitted to not retuning application.   [facility] informed patient to contact front desk if he is interesting in therapy.   Christen Butter, MSW, LCSW-A She/Her Behavioral Health Clinician [facility]  Internal Medicine Center Direct Dial:[phone]  Fax [phone] Main Office Phone: [phone] [address] [place] [place], [place] [address] Website: [facility] Internal Medicine [facility]  [place], [place]  [place]

## 2023-04-22 ENCOUNTER — Other Ambulatory Visit (HOSPITAL_COMMUNITY): Payer: Self-pay

## 2023-06-28 DIAGNOSIS — I219 Acute myocardial infarction, unspecified: Secondary | ICD-10-CM

## 2023-06-28 HISTORY — DX: Acute myocardial infarction, unspecified: I21.9

## 2023-06-30 ENCOUNTER — Telehealth: Payer: Self-pay | Admitting: Internal Medicine

## 2023-06-30 NOTE — Telephone Encounter (Signed)
Patient called and wanted to schedule an appointment with Dr. Lonzo Cloud. Appointment made for patient in September. Patient states he wanted to speak to Dr. Lonzo Cloud, I advised pt she was Out of the office this week and would be glad to send clinical staff a message. He refused. He states that he only wanted to speak to dr. Lonzo Cloud and states he wanted her to make adjustments to her schedule to work him in. I advised patient I would note this so that someone could contact patient . Just an FYI   .

## 2023-07-02 NOTE — Telephone Encounter (Signed)
Patient moved up from wait list

## 2023-07-06 ENCOUNTER — Ambulatory Visit: Payer: Medicaid Other | Admitting: Internal Medicine

## 2023-07-25 ENCOUNTER — Encounter (HOSPITAL_COMMUNITY): Payer: Self-pay

## 2023-07-25 ENCOUNTER — Other Ambulatory Visit: Payer: Self-pay

## 2023-07-25 ENCOUNTER — Inpatient Hospital Stay (HOSPITAL_COMMUNITY)
Admission: EM | Admit: 2023-07-25 | Discharge: 2023-07-26 | DRG: 321 | Disposition: A | Discharge status: Home / Self Care | Payer: Medicaid Other | Attending: Cardiovascular Disease | Admitting: Cardiovascular Disease

## 2023-07-25 ENCOUNTER — Encounter (HOSPITAL_COMMUNITY)
Admission: EM | Disposition: A | Discharge status: Home / Self Care | Payer: Self-pay | Source: Home / Self Care | Attending: Cardiovascular Disease

## 2023-07-25 ENCOUNTER — Emergency Department (HOSPITAL_COMMUNITY): Payer: Medicaid Other

## 2023-07-25 DIAGNOSIS — Z79899 Other long term (current) drug therapy: Secondary | ICD-10-CM | POA: Diagnosis not present

## 2023-07-25 DIAGNOSIS — Z794 Long term (current) use of insulin: Secondary | ICD-10-CM

## 2023-07-25 DIAGNOSIS — Z5986 Financial insecurity: Secondary | ICD-10-CM | POA: Diagnosis not present

## 2023-07-25 DIAGNOSIS — Z981 Arthrodesis status: Secondary | ICD-10-CM | POA: Diagnosis not present

## 2023-07-25 DIAGNOSIS — Z5329 Procedure and treatment not carried out because of patient's decision for other reasons: Secondary | ICD-10-CM | POA: Diagnosis present

## 2023-07-25 DIAGNOSIS — E7800 Pure hypercholesterolemia, unspecified: Secondary | ICD-10-CM | POA: Diagnosis not present

## 2023-07-25 DIAGNOSIS — Z95820 Peripheral vascular angioplasty status with implants and grafts: Secondary | ICD-10-CM

## 2023-07-25 DIAGNOSIS — I219 Acute myocardial infarction, unspecified: Secondary | ICD-10-CM | POA: Diagnosis not present

## 2023-07-25 DIAGNOSIS — I2121 ST elevation (STEMI) myocardial infarction involving left circumflex coronary artery: Secondary | ICD-10-CM | POA: Diagnosis not present

## 2023-07-25 DIAGNOSIS — Z888 Allergy status to other drugs, medicaments and biological substances status: Secondary | ICD-10-CM

## 2023-07-25 DIAGNOSIS — F32A Depression, unspecified: Secondary | ICD-10-CM | POA: Diagnosis present

## 2023-07-25 DIAGNOSIS — Z808 Family history of malignant neoplasm of other organs or systems: Secondary | ICD-10-CM

## 2023-07-25 DIAGNOSIS — K219 Gastro-esophageal reflux disease without esophagitis: Secondary | ICD-10-CM | POA: Diagnosis present

## 2023-07-25 DIAGNOSIS — I2510 Atherosclerotic heart disease of native coronary artery without angina pectoris: Secondary | ICD-10-CM | POA: Diagnosis not present

## 2023-07-25 DIAGNOSIS — Z833 Family history of diabetes mellitus: Secondary | ICD-10-CM

## 2023-07-25 DIAGNOSIS — Z8249 Family history of ischemic heart disease and other diseases of the circulatory system: Secondary | ICD-10-CM

## 2023-07-25 DIAGNOSIS — Z7985 Long-term (current) use of injectable non-insulin antidiabetic drugs: Secondary | ICD-10-CM | POA: Diagnosis not present

## 2023-07-25 DIAGNOSIS — N400 Enlarged prostate without lower urinary tract symptoms: Secondary | ICD-10-CM | POA: Diagnosis present

## 2023-07-25 DIAGNOSIS — I10 Essential (primary) hypertension: Secondary | ICD-10-CM | POA: Diagnosis not present

## 2023-07-25 DIAGNOSIS — Z823 Family history of stroke: Secondary | ICD-10-CM | POA: Diagnosis not present

## 2023-07-25 DIAGNOSIS — I2489 Other forms of acute ischemic heart disease: Secondary | ICD-10-CM | POA: Diagnosis not present

## 2023-07-25 DIAGNOSIS — Z87891 Personal history of nicotine dependence: Secondary | ICD-10-CM

## 2023-07-25 DIAGNOSIS — E1110 Type 2 diabetes mellitus with ketoacidosis without coma: Secondary | ICD-10-CM | POA: Diagnosis not present

## 2023-07-25 DIAGNOSIS — E1165 Type 2 diabetes mellitus with hyperglycemia: Secondary | ICD-10-CM

## 2023-07-25 DIAGNOSIS — Z7984 Long term (current) use of oral hypoglycemic drugs: Secondary | ICD-10-CM | POA: Diagnosis not present

## 2023-07-25 DIAGNOSIS — E119 Type 2 diabetes mellitus without complications: Secondary | ICD-10-CM | POA: Diagnosis present

## 2023-07-25 DIAGNOSIS — R0789 Other chest pain: Secondary | ICD-10-CM | POA: Diagnosis present

## 2023-07-25 DIAGNOSIS — I213 ST elevation (STEMI) myocardial infarction of unspecified site: Principal | ICD-10-CM

## 2023-07-25 DIAGNOSIS — E785 Hyperlipidemia, unspecified: Secondary | ICD-10-CM | POA: Diagnosis present

## 2023-07-25 HISTORY — PX: CORONARY/GRAFT ACUTE MI REVASCULARIZATION: CATH118305

## 2023-07-25 HISTORY — PX: LEFT HEART CATH AND CORONARY ANGIOGRAPHY: CATH118249

## 2023-07-25 LAB — CBC WITH DIFFERENTIAL/PLATELET
Abs Immature Granulocytes: 0.06 10*3/uL (ref 0.00–0.07)
Basophils Absolute: 0.1 10*3/uL (ref 0.0–0.1)
Basophils Relative: 1 %
Eosinophils Absolute: 0.1 10*3/uL (ref 0.0–0.5)
Eosinophils Relative: 1 %
HCT: 54.5 % — ABNORMAL HIGH (ref 39.0–52.0)
Hemoglobin: 18.7 g/dL — ABNORMAL HIGH (ref 13.0–17.0)
Immature Granulocytes: 0 %
Lymphocytes Relative: 15 %
Lymphs Abs: 2 10*3/uL (ref 0.7–4.0)
MCH: 30.8 pg (ref 26.0–34.0)
MCHC: 34.3 g/dL (ref 30.0–36.0)
MCV: 89.8 fL (ref 80.0–100.0)
Monocytes Absolute: 0.9 10*3/uL (ref 0.1–1.0)
Monocytes Relative: 7 %
Neutro Abs: 10.6 10*3/uL — ABNORMAL HIGH (ref 1.7–7.7)
Neutrophils Relative %: 76 %
Platelets: 234 10*3/uL (ref 150–400)
RBC: 6.07 MIL/uL — ABNORMAL HIGH (ref 4.22–5.81)
RDW: 13.1 % (ref 11.5–15.5)
WBC: 13.9 10*3/uL — ABNORMAL HIGH (ref 4.0–10.5)
nRBC: 0 % (ref 0.0–0.2)

## 2023-07-25 LAB — BASIC METABOLIC PANEL
Anion gap: 11 (ref 5–15)
BUN: 17 mg/dL (ref 6–20)
CO2: 18 mmol/L — ABNORMAL LOW (ref 22–32)
Calcium: 8.7 mg/dL — ABNORMAL LOW (ref 8.9–10.3)
Chloride: 103 mmol/L (ref 98–111)
Creatinine, Ser: 1.23 mg/dL (ref 0.61–1.24)
GFR, Estimated: >60 mL/min (ref >60)
Glucose, Bld: 396 mg/dL — ABNORMAL HIGH (ref 70–99)
Potassium: 3.3 mmol/L — ABNORMAL LOW (ref 3.5–5.1)
Sodium: 132 mmol/L — ABNORMAL LOW (ref 135–145)

## 2023-07-25 LAB — BLOOD GAS, VENOUS
Acid-base deficit: 1.5 mmol/L (ref 0.0–2.0)
Bicarbonate: 21.3 mmol/L (ref 20.0–28.0)
Drawn by: 68774
O2 Saturation: 88.4 %
Patient temperature: 36.7
pCO2, Ven: 30 mmHg — ABNORMAL LOW (ref 44–60)
pH, Ven: 7.46 — ABNORMAL HIGH (ref 7.25–7.43)
pO2, Ven: 51 mmHg — ABNORMAL HIGH (ref 32–45)

## 2023-07-25 LAB — URINALYSIS, COMPLETE (UACMP) WITH MICROSCOPIC
Bacteria, UA: NONE SEEN
Bilirubin Urine: NEGATIVE
Glucose, UA: >=500 mg/dL — AB
Hgb urine dipstick: NEGATIVE
Ketones, ur: 80 mg/dL — AB
Leukocytes,Ua: NEGATIVE
Nitrite: NEGATIVE
Protein, ur: NEGATIVE mg/dL
Specific Gravity, Urine: >1.046 — ABNORMAL HIGH (ref 1.005–1.030)
pH: 5 (ref 5.0–8.0)

## 2023-07-25 LAB — COMPREHENSIVE METABOLIC PANEL
ALT: 60 U/L — ABNORMAL HIGH (ref 0–44)
AST: 94 U/L — ABNORMAL HIGH (ref 15–41)
Albumin: 3.8 g/dL (ref 3.5–5.0)
Alkaline Phosphatase: 80 U/L (ref 38–126)
Anion gap: 19 — ABNORMAL HIGH (ref 5–15)
BUN: 15 mg/dL (ref 6–20)
CO2: 15 mmol/L — ABNORMAL LOW (ref 22–32)
Calcium: 9.2 mg/dL (ref 8.9–10.3)
Chloride: 99 mmol/L (ref 98–111)
Creatinine, Ser: 1.05 mg/dL (ref 0.61–1.24)
GFR, Estimated: >60 mL/min (ref >60)
Glucose, Bld: 412 mg/dL — ABNORMAL HIGH (ref 70–99)
Potassium: 4.3 mmol/L (ref 3.5–5.1)
Sodium: 133 mmol/L — ABNORMAL LOW (ref 135–145)
Total Bilirubin: 0.9 mg/dL (ref 0.3–1.2)
Total Protein: 7.2 g/dL (ref 6.5–8.1)

## 2023-07-25 LAB — LIPID PANEL
Cholesterol: 240 mg/dL — ABNORMAL HIGH (ref 0–200)
HDL: 37 mg/dL — ABNORMAL LOW (ref >40)
LDL Cholesterol: 181 mg/dL — ABNORMAL HIGH (ref 0–99)
Total CHOL/HDL Ratio: 6.5 RATIO
Triglycerides: 111 mg/dL (ref <150)
VLDL: 22 mg/dL (ref 0–40)

## 2023-07-25 LAB — I-STAT CG4 LACTIC ACID, ED: Lactic Acid, Venous: 3 mmol/L (ref 0.5–1.9)

## 2023-07-25 LAB — GLUCOSE, CAPILLARY
Glucose-Capillary: 252 mg/dL — ABNORMAL HIGH (ref 70–99)
Glucose-Capillary: 390 mg/dL — ABNORMAL HIGH (ref 70–99)
Glucose-Capillary: 398 mg/dL — ABNORMAL HIGH (ref 70–99)
Glucose-Capillary: 409 mg/dL — ABNORMAL HIGH (ref 70–99)
Glucose-Capillary: 451 mg/dL — ABNORMAL HIGH (ref 70–99)
Glucose-Capillary: 460 mg/dL — ABNORMAL HIGH (ref 70–99)
Glucose-Capillary: 541 mg/dL (ref 70–99)

## 2023-07-25 LAB — APTT: aPTT: 27 seconds (ref 24–36)

## 2023-07-25 LAB — BETA-HYDROXYBUTYRIC ACID: Beta-Hydroxybutyric Acid: 1.02 mmol/L — ABNORMAL HIGH (ref 0.05–0.27)

## 2023-07-25 LAB — TROPONIN I (HIGH SENSITIVITY)
Troponin I (High Sensitivity): 13933 ng/L (ref <18)
Troponin I (High Sensitivity): 20462 ng/L (ref <18)

## 2023-07-25 LAB — PROTIME-INR
INR: 1 (ref 0.8–1.2)
Prothrombin Time: 13.6 seconds (ref 11.4–15.2)

## 2023-07-25 LAB — POCT ACTIVATED CLOTTING TIME
Activated Clotting Time: 238 seconds
Activated Clotting Time: 293 seconds

## 2023-07-25 LAB — MRSA NEXT GEN BY PCR, NASAL: MRSA by PCR Next Gen: NOT DETECTED

## 2023-07-25 SURGERY — CORONARY/GRAFT ACUTE MI REVASCULARIZATION
Anesthesia: LOCAL

## 2023-07-25 MED ORDER — SODIUM CHLORIDE 0.9 % IV SOLN: INTRAVENOUS | Status: DC

## 2023-07-25 MED ORDER — SODIUM CHLORIDE 0.9 % WEIGHT BASED INFUSION
1.0000 mL/kg/h | INTRAVENOUS | Status: AC
  Administered 2023-07-25 (×2): 1 mL/kg/h via INTRAVENOUS

## 2023-07-25 MED ORDER — ATORVASTATIN CALCIUM 80 MG PO TABS
80.0000 mg | ORAL_TABLET | Freq: Every day | ORAL | Status: DC
  Administered 2023-07-25 – 2023-07-26 (×2): 80 mg via ORAL
  Filled 2023-07-25 (×2): qty 1

## 2023-07-25 MED ORDER — ACETAMINOPHEN 325 MG PO TABS: 650.0000 mg | ORAL_TABLET | ORAL | Status: DC | PRN

## 2023-07-25 MED ORDER — INSULIN REGULAR(HUMAN) IN NACL 100-0.9 UT/100ML-% IV SOLN
INTRAVENOUS | Status: DC
  Administered 2023-07-25: 13 [IU]/h via INTRAVENOUS
  Filled 2023-07-25: qty 100

## 2023-07-25 MED ORDER — ASPIRIN 81 MG PO CHEW: 324.0000 mg | CHEWABLE_TABLET | Freq: Once | ORAL | Status: DC

## 2023-07-25 MED ORDER — VERAPAMIL HCL 2.5 MG/ML IV SOLN
INTRAVENOUS | Status: DC | PRN
  Administered 2023-07-25: 10 mL via INTRA_ARTERIAL

## 2023-07-25 MED ORDER — HEPARIN (PORCINE) IN NACL 1000-0.9 UT/500ML-% IV SOLN
INTRAVENOUS | Status: DC | PRN
  Administered 2023-07-25 (×2): 500 mL

## 2023-07-25 MED ORDER — NITROGLYCERIN 1 MG/10 ML FOR IR/CATH LAB
INTRA_ARTERIAL | Status: DC | PRN
  Administered 2023-07-25: 200 ug via INTRACORONARY

## 2023-07-25 MED ORDER — FENTANYL CITRATE (PF) 100 MCG/2ML IJ SOLN
INTRAMUSCULAR | Status: AC
  Filled 2023-07-25: qty 2

## 2023-07-25 MED ORDER — INSULIN REGULAR(HUMAN) IN NACL 100-0.9 UT/100ML-% IV SOLN: INTRAVENOUS | Status: DC

## 2023-07-25 MED ORDER — VERAPAMIL HCL 2.5 MG/ML IV SOLN
INTRAVENOUS | Status: AC
  Filled 2023-07-25: qty 2

## 2023-07-25 MED ORDER — SODIUM CHLORIDE 0.9% FLUSH: 3.0000 mL | INTRAVENOUS | Status: DC | PRN

## 2023-07-25 MED ORDER — INSULIN ASPART 100 UNIT/ML IJ SOLN
0.0000 [IU] | Freq: Three times a day (TID) | INTRAMUSCULAR | Status: DC
  Administered 2023-07-25: 15 [IU] via SUBCUTANEOUS

## 2023-07-25 MED ORDER — CARVEDILOL 12.5 MG PO TABS: 12.5000 mg | ORAL_TABLET | Freq: Two times a day (BID) | ORAL | Status: DC

## 2023-07-25 MED ORDER — SODIUM CHLORIDE 0.9 % IV SOLN
INTRAVENOUS | Status: DC
  Filled 2023-07-25: qty 1

## 2023-07-25 MED ORDER — TICAGRELOR 90 MG PO TABS
ORAL_TABLET | ORAL | Status: DC | PRN
  Administered 2023-07-25: 180 mg via ORAL

## 2023-07-25 MED ORDER — INSULIN ASPART 100 UNIT/ML IJ SOLN: 0.0000 [IU] | Freq: Every day | INTRAMUSCULAR | Status: DC

## 2023-07-25 MED ORDER — ORAL CARE MOUTH RINSE: 15.0000 mL | OROMUCOSAL | Status: DC | PRN

## 2023-07-25 MED ORDER — SODIUM CHLORIDE 0.9 % IV SOLN
INTRAVENOUS | Status: AC | PRN
  Administered 2023-07-25: 500 mL via INTRAVENOUS

## 2023-07-25 MED ORDER — POTASSIUM CHLORIDE IN NACL 20-0.45 MEQ/L-% IV SOLN
INTRAVENOUS | Status: DC
  Filled 2023-07-25: qty 1000

## 2023-07-25 MED ORDER — HEPARIN SODIUM (PORCINE) 5000 UNIT/ML IJ SOLN
4000.0000 [IU] | Freq: Once | INTRAMUSCULAR | Status: AC
  Administered 2023-07-25: 4000 [IU] via INTRAVENOUS
  Filled 2023-07-25: qty 1

## 2023-07-25 MED ORDER — OXYCODONE HCL 5 MG PO TABS
5.0000 mg | ORAL_TABLET | ORAL | Status: DC | PRN
  Administered 2023-07-26 (×2): 5 mg via ORAL
  Filled 2023-07-25 (×2): qty 1

## 2023-07-25 MED ORDER — FENTANYL CITRATE (PF) 100 MCG/2ML IJ SOLN
INTRAMUSCULAR | Status: DC | PRN
  Administered 2023-07-25: 25 ug via INTRAVENOUS

## 2023-07-25 MED ORDER — HYDRALAZINE HCL 20 MG/ML IJ SOLN: 10.0000 mg | INTRAMUSCULAR | Status: AC | PRN

## 2023-07-25 MED ORDER — LABETALOL HCL 5 MG/ML IV SOLN: 10.0000 mg | INTRAVENOUS | Status: AC | PRN

## 2023-07-25 MED ORDER — LIDOCAINE HCL (PF) 1 % IJ SOLN
INTRAMUSCULAR | Status: AC
  Filled 2023-07-25: qty 30

## 2023-07-25 MED ORDER — TICAGRELOR 90 MG PO TABS
90.0000 mg | ORAL_TABLET | Freq: Two times a day (BID) | ORAL | Status: DC
  Administered 2023-07-25 – 2023-07-26 (×2): 90 mg via ORAL
  Filled 2023-07-25 (×2): qty 1

## 2023-07-25 MED ORDER — SODIUM CHLORIDE 0.9 % IV SOLN: 250.0000 mL | INTRAVENOUS | Status: DC | PRN

## 2023-07-25 MED ORDER — SODIUM CHLORIDE 0.9% FLUSH
3.0000 mL | Freq: Two times a day (BID) | INTRAVENOUS | Status: DC
  Administered 2023-07-25 – 2023-07-26 (×2): 3 mL via INTRAVENOUS

## 2023-07-25 MED ORDER — CARVEDILOL 3.125 MG PO TABS
3.1250 mg | ORAL_TABLET | Freq: Two times a day (BID) | ORAL | Status: DC
  Administered 2023-07-25 – 2023-07-26 (×2): 3.125 mg via ORAL
  Filled 2023-07-25 (×2): qty 1

## 2023-07-25 MED ORDER — MORPHINE SULFATE (PF) 2 MG/ML IV SOLN
2.0000 mg | INTRAVENOUS | Status: DC | PRN
  Administered 2023-07-26 (×2): 2 mg via INTRAVENOUS
  Filled 2023-07-25 (×2): qty 1

## 2023-07-25 MED ORDER — ASPIRIN 81 MG PO TBEC
81.0000 mg | DELAYED_RELEASE_TABLET | Freq: Every day | ORAL | Status: DC
  Administered 2023-07-26: 81 mg via ORAL
  Filled 2023-07-25: qty 1

## 2023-07-25 MED ORDER — ONDANSETRON HCL 4 MG/2ML IJ SOLN: 4.0000 mg | Freq: Four times a day (QID) | INTRAMUSCULAR | Status: DC | PRN

## 2023-07-25 MED ORDER — INSULIN GLARGINE-YFGN 100 UNIT/ML ~~LOC~~ SOLN
10.0000 [IU] | Freq: Once | SUBCUTANEOUS | Status: DC
  Filled 2023-07-25: qty 0.1

## 2023-07-25 MED ORDER — MIDAZOLAM HCL 2 MG/2ML IJ SOLN
INTRAMUSCULAR | Status: DC | PRN
  Administered 2023-07-25: 1 mg via INTRAVENOUS

## 2023-07-25 MED ORDER — LIDOCAINE HCL (PF) 1 % IJ SOLN
INTRAMUSCULAR | Status: DC | PRN
  Administered 2023-07-25: 2 mL

## 2023-07-25 MED ORDER — HEPARIN SODIUM (PORCINE) 1000 UNIT/ML IJ SOLN
INTRAMUSCULAR | Status: DC | PRN
  Administered 2023-07-25 (×2): 4000 [IU] via INTRAVENOUS

## 2023-07-25 MED ORDER — HEPARIN SODIUM (PORCINE) 1000 UNIT/ML IJ SOLN
INTRAMUSCULAR | Status: AC
  Filled 2023-07-25: qty 10

## 2023-07-25 MED ORDER — POTASSIUM CHLORIDE CRYS ER 20 MEQ PO TBCR
40.0000 meq | EXTENDED_RELEASE_TABLET | Freq: Once | ORAL | Status: AC
  Administered 2023-07-25: 40 meq via ORAL
  Filled 2023-07-25: qty 2

## 2023-07-25 MED ORDER — TICAGRELOR 90 MG PO TABS
ORAL_TABLET | ORAL | Status: AC
  Filled 2023-07-25: qty 2

## 2023-07-25 MED ORDER — DIAZEPAM 5 MG PO TABS: 5.0000 mg | ORAL_TABLET | Freq: Three times a day (TID) | ORAL | Status: DC | PRN

## 2023-07-25 MED ORDER — MIDAZOLAM HCL 2 MG/2ML IJ SOLN
INTRAMUSCULAR | Status: AC
  Filled 2023-07-25: qty 2

## 2023-07-25 MED ORDER — CHLORHEXIDINE GLUCONATE CLOTH 2 % EX PADS
6.0000 | MEDICATED_PAD | Freq: Every day | CUTANEOUS | Status: DC
  Administered 2023-07-25 – 2023-07-26 (×2): 6 via TOPICAL

## 2023-07-25 MED ORDER — IOHEXOL 350 MG/ML SOLN
INTRAVENOUS | Status: DC | PRN
  Administered 2023-07-25: 160 mL

## 2023-07-25 MED ORDER — LORATADINE 10 MG PO TABS
10.0000 mg | ORAL_TABLET | Freq: Every day | ORAL | Status: DC
  Administered 2023-07-26: 10 mg via ORAL
  Filled 2023-07-25 (×2): qty 1

## 2023-07-25 SURGICAL SUPPLY — 19 items
BALLN EMERGE MR 2.5X12 (BALLOONS) ×1
BALLN ~~LOC~~ EMERGE MR 2.75X12 (BALLOONS) ×1
BALLOON EMERGE MR 2.5X12 (BALLOONS) IMPLANT
BALLOON ~~LOC~~ EMERGE MR 2.75X12 (BALLOONS) IMPLANT
CATH 5FR JL3.5 JR4 ANG PIG MP (CATHETERS) IMPLANT
CATH LAUNCHER 6FR EBU3.5 (CATHETERS) IMPLANT
DEVICE RAD COMP TR BAND LRG (VASCULAR PRODUCTS) IMPLANT
GLIDESHEATH SLEND SS 6F .021 (SHEATH) IMPLANT
GUIDEWIRE INQWIRE 1.5J.035X260 (WIRE) IMPLANT
INQWIRE 1.5J .035X260CM (WIRE) ×1
KIT ENCORE 26 ADVANTAGE (KITS) IMPLANT
KIT HEART LEFT (KITS) ×2 IMPLANT
PACK CARDIAC CATHETERIZATION (CUSTOM PROCEDURE TRAY) ×2 IMPLANT
STENT MEGATRON 3.5X16 (Permanent Stent) IMPLANT
STENT SYNERGY XD 2.50X24 (Permanent Stent) IMPLANT
SYNERGY XD 2.50X24 (Permanent Stent) ×1 IMPLANT
TRANSDUCER W/STOPCOCK (MISCELLANEOUS) ×2 IMPLANT
TUBING CIL FLEX 10 FLL-RA (TUBING) ×2 IMPLANT
WIRE COUGAR XT STRL 190CM (WIRE) IMPLANT

## 2023-07-25 NOTE — Progress Notes (Addendum)
    Spoke w/ pt.  Obtained his home med list from him. The list is very different from the one in the computer, Pharmacy contacted and they will update.   It turns out that he was only taking hydralazine once a day, and not on any other meds for a year and a half.  This is due to financial issues.   States he tolerated all meds, including Lipitor 80 mg every day.   BP is normal right now, HR ok.  Spoke w/ Dr Excell Seltzer, use SSI. Start low-dose Coreg, titrate as indicated. Restart Lipitor 80 mg.   In am, consult [facility] to help w/ restarting his medications and obtaining them.   Lab Results  Component Value Date   HGBA1C >14.0 (A) 03/25/2023   Theodore Demark, PA-C 07/25/2023 1:30 PM

## 2023-07-25 NOTE — ED Notes (Signed)
Activated STEMI NOW per cardiology

## 2023-07-25 NOTE — H&P (Addendum)
Cardiology Admission History and Physical   Patient ID: [name] [name] MRN: [id number]; DOB: 06/10/1961   Admission date: 07/25/2023  PCP:  Rocky Morel, DO   [facility] Providers Cardiologist:  Tonny Bollman, MD        Chief Complaint:  STEMI  Patient Profile:   [name] [name] is a 62 y.o. male with hx non-obs dz by cardiac CT 2021, DM, resistant HTN, HLD, anxiety/depression, OSA, who is being seen 07/25/2023 for the evaluation of STEMI.  History of Present Illness:   [name] [name] was previously seen by Dr. Jacinto Halim, but saw Dr. Katrinka Blazing in January 2022.  Cardiac status was stable.  At 3 AM, he woke with chest pain.  The chest pain was associated with shortness of breath.  Later, he developed nausea.  The initial chest pain level was 10/10.  He took 4 aspirins, but apparently they were 200 mg each instead of 81 mg each.  After a while, he just felt worse and worse and so decided to come to the hospital.  In the ER, he was given 4000 units of heparin.  His ECG was reviewed by Dr. Rosalia Hammers and Dr. Anne Fu and it was felt he had an inferior STEMI.  The Cath Lab team was called and and he was taken directly up to the Cath Lab.  By the time he got to the Cath Lab, his chest pain was a 3/10.   Past Medical History:  Diagnosis Date   Anxiety    Coronary artery disease    Depression    Diabetes mellitus    Type 2   Enlarged prostate    Environmental allergies    Environmental and seasonal allergies    GERD (gastroesophageal reflux disease)    Headache(784.0)    sinus and migraines   Hyperlipidemia    Hypertension    IBS (irritable bowel syndrome)    Low testosterone    Sleep apnea    has used BiPAP in past; last sleep study >10 years    Past Surgical History:  Procedure Laterality Date   COLONOSCOPY  09/2018   normal, repeat in 10 years per patient.  [place], [place]   KNEE ARTHROSCOPY Left    RENAL ANGIOGRAPHY N/A 07/16/2020   Procedure: RENAL ANGIOGRAPHY;  Surgeon:  Yates Decamp, MD;  Location: [facility];  Service: Cardiovascular;  Laterality: N/A;   SACROILIAC JOINT FUSION Right 05/02/2014   Procedure: SACROILIAC JOINT FUSION;  Surgeon: Emilee Hero, MD;  Location: [facility] OR;  Service: Orthopedics;  Laterality: Right;  Right sided sacroiliac joint fusion   TONSILLECTOMY     uvula removed     Medications Prior to Admission: Prior to Admission medications   Medication Sig Start Date End Date Taking? Authorizing Provider  hydrALAZINE (APRESOLINE) 25 MG tablet TAKE 1 TABLET(25 MG) BY MOUTH THREE TIMES DAILY 02/26/22   Lyn Records, MD  amLODipine (NORVASC) 10 MG tablet TAKE 1 TABLET(10 MG) BY MOUTH DAILY 02/02/22   Lyn Records, MD  atorvastatin (LIPITOR) 80 MG tablet TAKE 1 TABLET(80 MG) BY MOUTH AT BEDTIME 03/16/22   Lyn Records, MD  carvedilol (COREG) 12.5 MG tablet TAKE 1 TABLET BY MOUTH TWICE DAILY. HOLD IF BLOOD PRESSURE LESS THAN 02/12/21   Lyn Records, MD  cetirizine (ZYRTEC) 10 MG tablet Take 1 tablet (10 mg total) by mouth daily. 06/28/20   Henson, Vickie L, NP-C  Dulaglutide (TRULICITY) 3 MG/0.5ML SOPN Inject 3 mg as directed once a week.  01/02/22   Shamleffer, Konrad Dolores, MD  empagliflozin (JARDIANCE) 25 MG TABS tablet Take 1 tablet (25 mg total) by mouth daily. 12/15/21   Shamleffer, Konrad Dolores, MD  fluticasone (FLONASE) 50 MCG/ACT nasal spray Place 2 sprays into both nostrils daily. 03/06/20   [provider]  insulin detemir (LEVEMIR FLEXPEN) 100 UNIT/ML FlexPen Inject 40 Units into the skin daily. 03/25/23   Adron Bene, MD  insulin lispro (HUMALOG) 100 UNIT/ML KwikPen Inject 10 Units into the skin 3 (three) times daily before meals. 03/25/23   Adron Bene, MD  Insulin Pen Needle (BD PEN NEEDLE NANO U/F) 32G X 4 MM MISC 1 each by Does not apply route at bedtime. 02/29/20   Tysinger, Kermit Balo, PA-C  Needles & Syringes MISC As directed 1ml of testosterone every 14 days 02/29/20   Tysinger, Kermit Balo, PA-C   olmesartan (BENICAR) 5 MG tablet Take 1 tablet (5 mg total) by mouth daily. 04/08/23   Adron Bene, MD  rOPINIRole (REQUIP) 0.5 MG tablet Take 2 tablets (1 mg total) by mouth daily. 04/08/23   Adron Bene, MD  tamsulosin (FLOMAX) 0.4 MG CAPS capsule Take 1 capsule (0.4 mg total) by mouth daily. 03/19/21   Tysinger, Kermit Balo, PA-C  Testosterone 20.25 MG/ACT (1.62%) GEL Place 81 mg onto the skin daily in the afternoon. 2 pumps to each shoulder 11/11/21   Shamleffer, Konrad Dolores, MD  TRINTELLIX 20 MG TABS tablet TAKE 1 TABLET(20 MG) BY MOUTH DAILY 12/04/20   Tysinger, Kermit Balo, PA-C     Allergies:    Allergies  Allergen Reactions   Pravastatin Other (See Comments)    Joint myalgias   Trazodone And Nefazodone     Had restless legs   Hydrochlorothiazide Other (See Comments)    lightheadedness   Metformin And Related Diarrhea    Social History:   Social History   Socioeconomic History   Marital status: Single    Spouse name: Not on file   Number of children: Not on file   Years of education: Not on file   Highest education level: Not on file  Occupational History   Not on file  Tobacco Use   Smoking status: Former    Current packs/day: 0.00    Average packs/day: 1 pack/day for 10.0 years (10.0 ttl pk-yrs)    Types: Cigarettes    Start date: 04/20/1982    Quit date: 04/20/1992    Years since quitting: 31.2   Smokeless tobacco: Never  Vaping Use   Vaping status: Never Used  Substance and Sexual Activity   Alcohol use: No   Drug use: No   Sexual activity: Not on file  Other Topics Concern   Not on file  Social History Narrative   Not on file   Social Determinants of Health   Financial Resource Strain: Not on file  Food Insecurity: Not on file  Transportation Needs: Not on file  Physical Activity: Unknown (03/20/2019)   Exercise Vital Sign    Days of Exercise per Week: 0 days    Minutes of Exercise per Session: Not on file  Stress: Not on file  Social  Connections: Unknown (05/11/2022)   [facility]   Social Network    Social Network: Not on file  Intimate Partner Violence: Unknown (04/01/2022)   Received from [facility]   HITS    Physically Hurt: Not on file    Insult or Talk Down To: Not on file    Threaten Physical Harm:  Not on file    Scream or Curse: Not on file    Family History:  The patient's family history includes Cancer in his brother, father, and mother; Diabetes in his brother; Heart disease in his maternal grandmother, paternal grandfather, and paternal grandmother; Heart disease (age of onset: 6) in his brother; Lung disease in his maternal grandfather; Stroke in his paternal grandmother.    ROS:  Please see the history of present illness. All other ROS reviewed and negative.     Physical Exam/Data:   Vitals:   07/25/23 1044 07/25/23 1107 07/25/23 1154  BP: (!) 129/99    Pulse: (!) 108    Resp: 16    Temp: 98.1 F (36.7 C)    SpO2: 99%  98%  Weight:  74.8 kg   Height:  5\' 10"  (1.778 m)    No intake or output data in the 24 hours ending 07/25/23 1207    07/25/2023   11:07 AM 04/08/2023    3:03 PM 03/25/2023   10:32 AM  Last 3 Weights  Weight (lbs) 165 lb 265 lb 6.4 oz 181 lb  Weight (kg) 74.844 kg 120.385 kg 82.101 kg     Body mass index is 23.68 kg/m.  General:  Well nourished, well developed, in acute distress HEENT: normal Neck: no JVD Vascular: No carotid bruits; Distal pulses 2+ bilaterally   Cardiac:  normal S1, S2; RRR; no murmur  Lungs:  clear to auscultation bilaterally, no wheezing, rhonchi or rales  Abd: soft, nontender, no hepatomegaly  Ext: no edema Musculoskeletal:  No deformities, BUE and BLE strength normal and equal Skin: warm and dry  Neuro:  CNs 2-12 intact, no focal abnormalities noted Psych:  Normal affect    EKG:  The ECG that was done today was personally reviewed and demonstrates sinus tach, heart rate 103, inferior ST changes which are different from  02/2023 ECG  Relevant CV Studies:  CARDIAC CT: 04/02/2020 FINDINGS: Non-cardiac: See separate report from [facility]. No significant findings on limited lung and soft tissue windows.   Calcium score: 3 vessel calcium noted most prominent in LAD   Coronary Arteries: Right dominant with no anomalies   LM: Normal   LAD: 50-69% mixed plaque with prominent soft plaque component in proximal vessel 50-69% calcific plaque in mid/distal vessel   D1: 25-49% soft plaque in ostial vessel   D2: Small vessel 50-69% ostial calcific plaque   D3: Small vessel   Circumflex: 1-24% soft plaque in mid vessel   OM1: Normal   AV Groove: Normal   RCA: 1-24% calcified ostial stenosis   PDA: Normal   PLB: Normal   IMPRESSION: 1. Calcium score 95 which is 76 th percentile for age and sex   2.  Normal aortic root 2.9 cm   3.  CAD CAD RADS 3 most significant in LAD and diagonal   4.  Study sent for FFR CT   Charlton Haws No FFR CT findings < 0.80   RCA 0.99   Circumflex 0.90   LAD 0.94 proximally 0.88 mid vessel 0.84 distally   D1: 0.89   IMPRESSION: FFR CT does not suggest hemodynamically significant lesions in LAD/Diagonal see above. Medical Rx and aggressive risk factor modification   With close cardiology f/u indicated   ECHO: 03/12/2020 1. Left ventricle cavity is normal in size. Mild concentric hypertrophy of the left ventricle. Normal LV systolic function with EF 68%. Normal global wall motion. Doppler evidence of grade I (impaired) diastolic dysfunction,  normal LAP. 2. Mild (Grade I) mitral regurgitation. 3. Normal right atrial pressure.  Laboratory Data:  High Sensitivity Troponin:  No results for input(s): "TROPONINIHS" in the last 720 hours.    ChemistryNo results for input(s): "NA", "K", "CL", "CO2", "GLUCOSE", "BUN", "CREATININE", "CALCIUM", "MG", "GFRNONAA", "GFRAA", "ANIONGAP" in the last 168 hours.  No results for input(s): "PROT", "ALBUMIN", "AST",  "ALT", "ALKPHOS", "BILITOT" in the last 168 hours. Lipids No results for input(s): "CHOL", "TRIG", "HDL", "LABVLDL", "LDLCALC", "CHOLHDL" in the last 168 hours. Hematology Recent Labs  Lab 07/25/23 1110  WBC 13.9*  RBC 6.07*  HGB 18.7*  HCT 54.5*  MCV 89.8  MCH 30.8  MCHC 34.3  RDW 13.1  PLT 234   Thyroid No results for input(s): "TSH", "FREET4" in the last 168 hours. BNPNo results for input(s): "BNP", "PROBNP" in the last 168 hours.  DDimer No results for input(s): "DDIMER" in the last 168 hours.   Radiology/Studies:  No results found.   Assessment and Plan:   Inferior STEMI: -He is being taken directly to the Cath Lab with further evaluation and treatment depending on the results  2.  DM: -Continue home insulin, will hold Jardiance, add SSI  3.  Hypertension: Continue home medications and monitor closely   Risk Assessment/Risk Scores:    TIMI Risk Score for ST  Elevation MI:   The patient's TIMI risk score is 4, which indicates a 7.3% risk of all cause mortality at 30 days.    For questions or updates, please contact [facility] Please consult [email or web address] for contact info under     Signed, Theodore Demark, PA-C  07/25/2023 12:07 PM   Patient seen, examined. Available data reviewed. Agree with findings, assessment, and plan as outlined by Theodore Demark, PA-C. The patient is independently interviewed and examined on his arrival to the cath lab. On exam: Vitals:   07/25/23 1231 07/25/23 1236  BP: 110/77 118/72  Pulse: 88 85  Resp: 19 (!) 21  Temp:    SpO2: 97% 96%   Pt is alert and oriented, NAD HEENT: normal Neck: JVP - normal, carotids 2+= without bruits Lungs: CTA bilaterally CV: RRR without murmur or gallop Abd: soft, NT, Positive BS, no hepatomegaly Ext: no C/C/E, distal pulses intact and equal Skin: warm/dry no rash  Pt with acute inferolateral STEMI, several hours of ongoing chest discomfort. He has uncontrolled diabetes, with HgB  A1C > 14, mildly elevated lactate. Plan emergency cardiac cath and possible PCI. Further plans and disposition pending cath results. Diabetes RN consult.   Tonny Bollman, M.D. 07/25/2023 1:13 PM

## 2023-07-25 NOTE — Progress Notes (Addendum)
Brief cardiology night note:  Called overnight about CBG 390. Patient is currently on sliding scale insulin.  Per patient report, only on home PO medications for diabetes, but does not take insulin - all medications were stopped at home because patient lost insurance. No confusion/nausea/vomiting at this time.  Assessment: - Obtain urine ketones, BHB and repeat BMP - Lantus 10 U nightly + SSI, will likely need higher dose of insulin - diabetes consult in AM - If ketones or elevated BHB, will start insulin drip  Update 8:40PM - Urine ketones positive, Bicarb 15. VBG 7.46/30/51 (not c/w acidosis) - Will start insulin drip and stop SSI - carb controlled diet - Keep K>4, Mg >2  Willette Alma MD MPH [facility]

## 2023-07-25 NOTE — ED Provider Notes (Signed)
[place] EMERGENCY DEPARTMENT AT [facility] Provider Note   CSN: [id number] Arrival date & time: 07/25/23  1012     History {Add pertinent medical, surgical, social history, OB history to HPI:1} Chief Complaint  Patient presents with   Chest Pain    [name] [name] is a 62 y.o. male.  HPI 62 year old male with history of hypertension, diabetes, hypercholesterolemia, Presents today complaining of substernal chest pain radiating to his neck bilaterally.  Symptoms began last night and have been constant in nature.  He took aspirin without relief.  He states it was worsened by trying to walk into the hospital feeling very heavy and weak.  He denies similar symptoms in the past.  He is not a smoker.    Home Medications Prior to Admission medications   Medication Sig Start Date End Date Taking? Authorizing Provider  hydrALAZINE (APRESOLINE) 25 MG tablet TAKE 1 TABLET(25 MG) BY MOUTH THREE TIMES DAILY 02/26/22   Lyn Records, MD  amLODipine (NORVASC) 10 MG tablet TAKE 1 TABLET(10 MG) BY MOUTH DAILY 02/02/22   Lyn Records, MD  atorvastatin (LIPITOR) 80 MG tablet TAKE 1 TABLET(80 MG) BY MOUTH AT BEDTIME 03/16/22   Lyn Records, MD  carvedilol (COREG) 12.5 MG tablet TAKE 1 TABLET BY MOUTH TWICE DAILY. HOLD IF BLOOD PRESSURE LESS THAN 02/12/21   Lyn Records, MD  cetirizine (ZYRTEC) 10 MG tablet Take 1 tablet (10 mg total) by mouth daily. 06/28/20   Henson, Vickie L, NP-C  Dulaglutide (TRULICITY) 3 MG/0.5ML SOPN Inject 3 mg as directed once a week. 01/02/22   Shamleffer, Konrad Dolores, MD  empagliflozin (JARDIANCE) 25 MG TABS tablet Take 1 tablet (25 mg total) by mouth daily. 12/15/21   Shamleffer, Konrad Dolores, MD  fluticasone (FLONASE) 50 MCG/ACT nasal spray Place 2 sprays into both nostrils daily. 03/06/20   [provider]  insulin detemir (LEVEMIR FLEXPEN) 100 UNIT/ML FlexPen Inject 40 Units into the skin daily. 03/25/23   Adron Bene, MD  insulin lispro  (HUMALOG) 100 UNIT/ML KwikPen Inject 10 Units into the skin 3 (three) times daily before meals. 03/25/23   Adron Bene, MD  Insulin Pen Needle (BD PEN NEEDLE NANO U/F) 32G X 4 MM MISC 1 each by Does not apply route at bedtime. 02/29/20   Tysinger, Kermit Balo, PA-C  Needles & Syringes MISC As directed 1ml of testosterone every 14 days 02/29/20   Tysinger, Kermit Balo, PA-C  olmesartan (BENICAR) 5 MG tablet Take 1 tablet (5 mg total) by mouth daily. 04/08/23   Adron Bene, MD  rOPINIRole (REQUIP) 0.5 MG tablet Take 2 tablets (1 mg total) by mouth daily. 04/08/23   Adron Bene, MD  tamsulosin (FLOMAX) 0.4 MG CAPS capsule Take 1 capsule (0.4 mg total) by mouth daily. 03/19/21   Tysinger, Kermit Balo, PA-C  Testosterone 20.25 MG/ACT (1.62%) GEL Place 81 mg onto the skin daily in the afternoon. 2 pumps to each shoulder 11/11/21   Shamleffer, Konrad Dolores, MD  TRINTELLIX 20 MG TABS tablet TAKE 1 TABLET(20 MG) BY MOUTH DAILY 12/04/20   Tysinger, Kermit Balo, PA-C      Allergies    Pravastatin, Trazodone and nefazodone, Hydrochlorothiazide, and Metformin and related    Review of Systems   Review of Systems  Physical Exam Updated Vital Signs BP (!) 129/99 (BP Location: Right Arm)   Pulse (!) 108   Temp 98.1 F (36.7 C)   Resp 16   Ht 1.778 m (5\' 10" )  Wt 74.8 kg   SpO2 99%   BMI 23.68 kg/m  Physical Exam Vitals and nursing note reviewed.  Constitutional:      Appearance: He is well-developed.  HENT:     Head: Normocephalic and atraumatic.     Right Ear: External ear normal.     Left Ear: External ear normal.     Nose: Nose normal.  Eyes:     Extraocular Movements: Extraocular movements intact.  Neck:     Trachea: No tracheal deviation.  Cardiovascular:     Heart sounds: Normal heart sounds.  Pulmonary:     Effort: Pulmonary effort is normal.     Breath sounds: Normal breath sounds.  Abdominal:     General: Bowel sounds are normal.     Palpations: Abdomen is soft.  Musculoskeletal:         General: Normal range of motion.  Skin:    General: Skin is warm and dry.  Neurological:     Mental Status: He is alert and oriented to person, place, and time.  Psychiatric:        Mood and Affect: Mood normal.        Behavior: Behavior normal.     ED Results / Procedures / Treatments   Labs (all labs ordered are listed, but only abnormal results are displayed) Labs Reviewed  HEMOGLOBIN A1C  CBC WITH DIFFERENTIAL/PLATELET  PROTIME-INR  APTT  COMPREHENSIVE METABOLIC PANEL  LIPID PANEL  CBC WITH DIFFERENTIAL/PLATELET  I-STAT CG4 LACTIC ACID, ED  TROPONIN I (HIGH SENSITIVITY)    EKG EKG Interpretation Date/Time:  Sunday July 25 2023 10:50:59 EDT Ventricular Rate:  103 PR Interval:  146 QRS Duration:  82 QT Interval:  338 QTC Calculation: 442 R Axis:   90  Text Interpretation: Sinus tachycardia Rightward axis Possible Inferior infarct , age undetermined Anteroseptal infarct , age undetermined Abnormal ECG When compared with ECG of 26-Mar-2015 14:47, PREVIOUS ECG IS PRESENT st elevation II, III, and avf Confirmed by Margarita Grizzle [phone]) on 07/25/2023 11:03:44 AM  Radiology No results found.  Procedures .Critical Care  Performed by: Margarita Grizzle, MD Authorized by: Margarita Grizzle, MD   Critical care provider statement:    Critical care time (minutes):  45   Critical care was time spent personally by me on the following activities:  Development of treatment plan with patient or surrogate, discussions with consultants, evaluation of patient's response to treatment, examination of patient, ordering and review of laboratory studies, ordering and review of radiographic studies, ordering and performing treatments and interventions, pulse oximetry, re-evaluation of patient's condition and review of old charts   {Document cardiac monitor, telemetry assessment procedure when appropriate:1}  Medications Ordered in ED Medications  0.9 %  sodium chloride infusion (has no  administration in time range)  aspirin chewable tablet 324 mg (324 mg Oral Not Given 07/25/23 1122)  heparin injection 4,000 Units (has no administration in time range)    ED Course/ Medical Decision Making/ A&P   {   Click here for ABCD2, HEART and other calculatorsREFRESH Note before signing :1}                          Medical Decision Making Amount and/or Complexity of Data Reviewed Labs: ordered. Radiology: ordered.  Risk OTC drugs. Prescription drug management.   EKG reviewed with ST elevation noted in 2 3 and aVF at approximately 1 mm.  Discussed care with Dr. Anne Fu, on-call for cardiology.  He  has reviewed patient's previous coronary artery CT.  Given patient's symptoms, will initiate as code STEMI. 11:23 AM Patient had episode of unresponsiveness in triage when he was being stuck for labs.  He was not on the monitor at that time.  He is awake and alert now.  He is back in bed 24.  He states his chest pain feels much better down to 1. Discussed with Dr. Excell Seltzer who is coming in to take him to the Cath Lab.  {Document critical care time when appropriate:1} {Document review of labs and clinical decision tools ie heart score, Chads2Vasc2 etc:1}  {Document your independent review of radiology images, and any outside records:1} {Document your discussion with family members, caretakers, and with consultants:1} {Document social determinants of health affecting pt's care:1} {Document your decision making why or why not admission, treatments were needed:1} Final Clinical Impression(s) / ED Diagnoses Final diagnoses:  ST elevation myocardial infarction (STEMI), unspecified artery (HCC)    Rx / DC Orders ED Discharge Orders     None

## 2023-07-25 NOTE — Plan of Care (Signed)
Patient having no chest pain or arrythmias.  CBG is high and have paged for further orders.

## 2023-07-25 NOTE — Progress Notes (Signed)
   07/25/23 1200  Spiritual Encounters  Type of Visit Initial  Care provided to: Patient  Conversation partners present during encounter Nurse  Referral source Trauma page  Reason for visit Trauma  OnCall Visit Yes   Ch responded to code page. There was no family present. Pt is in Cath Lab. Ch is available if needed.

## 2023-07-25 NOTE — ED Triage Notes (Signed)
Pt arrived POV from home c/o centralized CP that started last night and has been constant. Pt is c/o neck pain, jaw pain, chest pain and states he had a heart attack.

## 2023-07-25 NOTE — ED Notes (Signed)
Dr. Rosalia Hammers notified of lactic level.

## 2023-07-26 ENCOUNTER — Other Ambulatory Visit (HOSPITAL_COMMUNITY): Payer: Self-pay

## 2023-07-26 ENCOUNTER — Telehealth: Payer: Self-pay | Admitting: Cardiology

## 2023-07-26 ENCOUNTER — Encounter (HOSPITAL_COMMUNITY): Payer: Self-pay | Admitting: Cardiovascular Disease

## 2023-07-26 ENCOUNTER — Inpatient Hospital Stay (HOSPITAL_COMMUNITY): Payer: Medicaid Other

## 2023-07-26 DIAGNOSIS — I2489 Other forms of acute ischemic heart disease: Secondary | ICD-10-CM | POA: Diagnosis not present

## 2023-07-26 DIAGNOSIS — I2121 ST elevation (STEMI) myocardial infarction involving left circumflex coronary artery: Secondary | ICD-10-CM | POA: Diagnosis not present

## 2023-07-26 DIAGNOSIS — Z95820 Peripheral vascular angioplasty status with implants and grafts: Secondary | ICD-10-CM

## 2023-07-26 HISTORY — DX: Peripheral vascular angioplasty status with implants and grafts: Z95.820

## 2023-07-26 LAB — GLUCOSE, CAPILLARY
Glucose-Capillary: 178 mg/dL — ABNORMAL HIGH (ref 70–99)
Glucose-Capillary: 189 mg/dL — ABNORMAL HIGH (ref 70–99)
Glucose-Capillary: 152 mg/dL — ABNORMAL HIGH (ref 70–99)
Glucose-Capillary: 155 mg/dL — ABNORMAL HIGH (ref 70–99)
Glucose-Capillary: 159 mg/dL — ABNORMAL HIGH (ref 70–99)
Glucose-Capillary: 165 mg/dL — ABNORMAL HIGH (ref 70–99)
Glucose-Capillary: 143 mg/dL — ABNORMAL HIGH (ref 70–99)
Glucose-Capillary: 177 mg/dL — ABNORMAL HIGH (ref 70–99)
Glucose-Capillary: 221 mg/dL — ABNORMAL HIGH (ref 70–99)
Glucose-Capillary: 231 mg/dL — ABNORMAL HIGH (ref 70–99)
Glucose-Capillary: 179 mg/dL — ABNORMAL HIGH (ref 70–99)
Glucose-Capillary: 207 mg/dL — ABNORMAL HIGH (ref 70–99)

## 2023-07-26 LAB — BASIC METABOLIC PANEL
Anion gap: 10 (ref 5–15)
BUN: 23 mg/dL — ABNORMAL HIGH (ref 6–20)
CO2: 21 mmol/L — ABNORMAL LOW (ref 22–32)
Calcium: 8.9 mg/dL (ref 8.9–10.3)
Chloride: 104 mmol/L (ref 98–111)
Creatinine, Ser: 1 mg/dL (ref 0.61–1.24)
GFR, Estimated: >60 mL/min (ref >60)
Glucose, Bld: 226 mg/dL — ABNORMAL HIGH (ref 70–99)
Potassium: 3.7 mmol/L (ref 3.5–5.1)
Sodium: 135 mmol/L (ref 135–145)

## 2023-07-26 LAB — TROPONIN I (HIGH SENSITIVITY): Troponin I (High Sensitivity): 9727 ng/L (ref <18)

## 2023-07-26 MED ORDER — NITROGLYCERIN 0.4 MG SL SUBL
0.4000 mg | SUBLINGUAL_TABLET | SUBLINGUAL | 4 refills | Status: DC | PRN
  Filled 2023-07-26: qty 25, 1d supply, fill #0

## 2023-07-26 MED ORDER — LEVEMIR FLEXPEN 100 UNIT/ML ~~LOC~~ SOPN
40.0000 [IU] | PEN_INJECTOR | Freq: Every day | SUBCUTANEOUS | 11 refills | Status: DC
Start: 2023-07-26 — End: 2023-07-26
  Filled 2023-07-26: qty 12, 30d supply, fill #0

## 2023-07-26 MED ORDER — INSULIN GLARGINE-YFGN 100 UNIT/ML ~~LOC~~ SOLN
35.0000 [IU] | SUBCUTANEOUS | Status: DC
  Filled 2023-07-26: qty 0.35

## 2023-07-26 MED ORDER — INSULIN GLARGINE 100 UNITS/ML SOLOSTAR PEN
40.0000 [IU] | PEN_INJECTOR | Freq: Every day | SUBCUTANEOUS | 3 refills | Status: DC
  Filled 2023-07-26: qty 15, 37d supply, fill #0
  Filled 2023-08-06: qty 15, 37d supply, fill #1

## 2023-07-26 MED ORDER — ATORVASTATIN CALCIUM 80 MG PO TABS
80.0000 mg | ORAL_TABLET | Freq: Every day | ORAL | 6 refills | Status: DC
  Filled 2023-07-26: qty 30, 30d supply, fill #0

## 2023-07-26 MED ORDER — NITROGLYCERIN 0.4 MG SL SUBL: 0.4000 mg | SUBLINGUAL_TABLET | SUBLINGUAL | Status: DC | PRN

## 2023-07-26 MED ORDER — CETIRIZINE HCL 10 MG PO TABS
10.0000 mg | ORAL_TABLET | Freq: Every day | ORAL | 0 refills | Status: DC
  Filled 2023-07-26: qty 100, 100d supply, fill #0

## 2023-07-26 MED ORDER — INSULIN GLARGINE-YFGN 100 UNIT/ML ~~LOC~~ SOLN
35.0000 [IU] | SUBCUTANEOUS | Status: AC
  Administered 2023-07-26: 35 [IU] via SUBCUTANEOUS
  Filled 2023-07-26: qty 0.35

## 2023-07-26 MED ORDER — ASPIRIN 81 MG PO TBEC
81.0000 mg | DELAYED_RELEASE_TABLET | Freq: Every day | ORAL | 2 refills | Status: DC
  Filled 2023-07-26: qty 120, 120d supply, fill #0

## 2023-07-26 MED ORDER — INSULIN LISPRO (1 UNIT DIAL) 100 UNIT/ML (KWIKPEN)
20.0000 [IU] | PEN_INJECTOR | Freq: Three times a day (TID) | SUBCUTANEOUS | 11 refills | Status: DC
  Filled 2023-07-26: qty 15, 25d supply, fill #0
  Filled 2023-08-06: qty 15, 25d supply, fill #1

## 2023-07-26 MED ORDER — TICAGRELOR 90 MG PO TABS
90.0000 mg | ORAL_TABLET | Freq: Two times a day (BID) | ORAL | 11 refills | Status: DC
  Filled 2023-07-26: qty 60, 30d supply, fill #0

## 2023-07-26 MED ORDER — ACETAMINOPHEN 325 MG PO TABS: 650.0000 mg | ORAL_TABLET | ORAL | Status: DC | PRN

## 2023-07-26 MED ORDER — PERFLUTREN LIPID MICROSPHERE
1.0000 mL | INTRAVENOUS | Status: AC | PRN
  Administered 2023-07-26: 2 mL via INTRAVENOUS

## 2023-07-26 MED ORDER — CARVEDILOL 3.125 MG PO TABS
3.1250 mg | ORAL_TABLET | Freq: Two times a day (BID) | ORAL | 6 refills | Status: DC
  Filled 2023-07-26: qty 60, 30d supply, fill #0

## 2023-07-26 MED ORDER — INSULIN PEN NEEDLE 32G X 4 MM MISC
0 refills | Status: DC
  Filled 2023-07-26: qty 100, 100d supply, fill #0

## 2023-07-26 NOTE — Progress Notes (Signed)
CARDIAC REHAB PHASE I   PRE:  Rate/Rhythm: 80 SR    BP: sitting 114/60    SpO2: 95 RA  MODE:  Ambulation: 740 ft   POST:  Rate/Rhythm: 82 SR    BP: sitting 109/61     SpO2: 96 RA  Pt reports 1/10 chest tightness, 2/10 jaw tightness, and 5/10 back tightness (between shoulder blades). Able to ambulate without change in his sx. Tolerated well.   Discussed with pt MI, stents, Brilinta importance, restrictions, diet (esp decreasing sugars), exercise, NTG, and CRPII. Pt voiced reception and has plans for change. Will refer to [facility] CRPII.   [id number]  Ethelda Chick BS, ACSM-CEP 07/26/2023 12:33 PM

## 2023-07-26 NOTE — TOC Benefit Eligibility Note (Signed)
Pharmacy Patient Advocate Encounter  Insurance verification completed.    The patient is insured through  [employer]     Ran test claim for Brilinta and the current 30 day co-pay is $4.00.   This test claim was processed through [facility]- copay amounts may vary at other pharmacies due to pharmacy/plan contracts, or as the patient moves through the different stages of their insurance plan.

## 2023-07-26 NOTE — Plan of Care (Signed)
  Problem: Education: Goal: Knowledge of General Education information will improve Description: Including pain rating scale, medication(s)/side effects and non-pharmacologic comfort measures 07/26/2023 1400 by Aryahna Spagna, [place] L, RN Outcome: Adequate for Discharge 07/26/2023 0829 by Gwendoline Judy, [place] L, RN Outcome: Progressing   Problem: Health Behavior/Discharge Planning: Goal: Ability to manage health-related needs will improve Outcome: Adequate for Discharge   Problem: Clinical Measurements: Goal: Ability to maintain clinical measurements within normal limits will improve 07/26/2023 1400 by Vonnie Spagnolo, [place] L, RN Outcome: Adequate for Discharge 07/26/2023 0829 by Shuntae Herzig, [place] L, RN Outcome: Progressing Goal: Will remain free from infection Outcome: Adequate for Discharge Goal: Diagnostic test results will improve Outcome: Adequate for Discharge Goal: Respiratory complications will improve Outcome: Adequate for Discharge Goal: Cardiovascular complication will be avoided Outcome: Adequate for Discharge   Problem: Activity: Goal: Risk for activity intolerance will decrease Outcome: Adequate for Discharge   Problem: Nutrition: Goal: Adequate nutrition will be maintained Outcome: Adequate for Discharge   Problem: Coping: Goal: Level of anxiety will decrease 07/26/2023 1400 by Elira Colasanti, [place] L, RN Outcome: Adequate for Discharge 07/26/2023 0829 by Miliyah Luper, [place] L, RN Outcome: Progressing   Problem: Elimination: Goal: Will not experience complications related to bowel motility Outcome: Adequate for Discharge Goal: Will not experience complications related to urinary retention Outcome: Adequate for Discharge   Problem: Pain Managment: Goal: General experience of comfort will improve Outcome: Adequate for Discharge   Problem: Safety: Goal: Ability to remain free from injury will improve Outcome: Adequate for Discharge   Problem: Skin Integrity: Goal: Risk for  impaired skin integrity will decrease Outcome: Adequate for Discharge   Problem: Education: Goal: Ability to describe self-care measures that may prevent or decrease complications (Diabetes Survival Skills Education) will improve Outcome: Adequate for Discharge Goal: Individualized Educational Video(s) Outcome: Adequate for Discharge   Problem: Coping: Goal: Ability to adjust to condition or change in health will improve Outcome: Adequate for Discharge   Problem: Fluid Volume: Goal: Ability to maintain a balanced intake and output will improve Outcome: Adequate for Discharge   Problem: Health Behavior/Discharge Planning: Goal: Ability to identify and utilize available resources and services will improve Outcome: Adequate for Discharge Goal: Ability to manage health-related needs will improve Outcome: Adequate for Discharge   Problem: Metabolic: Goal: Ability to maintain appropriate glucose levels will improve Outcome: Adequate for Discharge   Problem: Nutritional: Goal: Maintenance of adequate nutrition will improve Outcome: Adequate for Discharge Goal: Progress toward achieving an optimal weight will improve Outcome: Adequate for Discharge   Problem: Skin Integrity: Goal: Risk for impaired skin integrity will decrease Outcome: Adequate for Discharge   Problem: Tissue Perfusion: Goal: Adequacy of tissue perfusion will improve Outcome: Adequate for Discharge   Problem: Education: Goal: Understanding of CV disease, CV risk reduction, and recovery process will improve Outcome: Adequate for Discharge   Problem: Activity: Goal: Ability to return to baseline activity level will improve Outcome: Adequate for Discharge   Problem: Cardiovascular: Goal: Ability to achieve and maintain adequate cardiovascular perfusion will improve Outcome: Adequate for Discharge Goal: Vascular access site(s) Level 0-1 will be maintained Outcome: Adequate for Discharge   Problem: Health  Behavior/Discharge Planning: Goal: Ability to safely manage health-related needs after discharge will improve Outcome: Adequate for Discharge

## 2023-07-26 NOTE — Progress Notes (Signed)
    I have asked IM with teaching service to see for social issues and uncontrolled HTN.  They have followed in clinic.    Nada Boozer, FNP-C At [facility]  Pgr:[phone] or after 5pm and on weekends call [phone] 07/26/2023.now

## 2023-07-26 NOTE — Progress Notes (Signed)
PIVs removed. Discharge order received. AVS given and explained, all questions answered. TOC meds at bedside, education provided. Follow up appointments discussed. Taxi voucher in hand, transported to discharge lounge.

## 2023-07-26 NOTE — Discharge Summary (Addendum)
Discharge Summary    Patient ID: [name] [name] MRN: [id number]; DOB: 1961-09-28  Admit date: 07/25/2023 Discharge date: 07/26/2023  PCP:  Farris Has, MD   [facility] Providers Cardiologist:  Tonny Bollman, MD        Discharge Diagnoses    Principal Problem:   STEMI involving left circumflex coronary artery [facility]) Active Problems:   HLD (hyperlipidemia)   DKA, type 2, not at goal [employer])   CAD in native artery   S/P angioplasty with stent to distal LCX and mid LCX both DES 07/25/23    Diagnostic Studies/Procedures    Cardiac cath Emergent 2063/12/18   __   Mid Cx to Dist Cx lesion is 100% stenosed.   Prox Cx to Mid Cx lesion is 80% stenosed.   Prox LAD lesion is 70% stenosed.   A drug-eluting stent was successfully placed using a SYNERGY XD 2.50X24.   A drug-eluting stent was successfully placed using a STENT MEGATRON 3.5X16.   Post intervention, there is a 0% residual stenosis.   Post intervention, there is a 0% residual stenosis.   LV end diastolic pressure is low.   The left ventricular ejection fraction is 50-55% by visual estimate.   Recommend uninterrupted dual antiplatelet therapy with Aspirin 81mg  daily and Ticagrelor 90mg  twice daily for a minimum of 12 months (ACS-Class I recommendation).   Acute inferolateral MI secondary to occlusion of the distal circumflex, treated successful with primary PCI using a 2.5x24 mm Synergy DES Severe mid-circumflex stenosis, treated with a 3.5x16 mm Megatron DES (80%-->0) Moderately severe ulcerative plaque in the proximal LAD of 70% Nonobstructive RCA stenosis Mild segmental LV dysfunction with LVEF estimated at 50-55%   Recommend: post-MI medical therapy. Consider staged IVUS +/- PCI of the proximal LAD  ___________   Diagnostic Dominance: Right  Intervention      Echo 07/26/23   IMPRESSIONS     1. Left ventricular ejection fraction, by estimation, is 50 to 55%. The  left ventricle has low normal  function. The left ventricle demonstrates  regional wall motion abnormalities with basal inferolateral and basal  anterolateral akinesis. There is mild  concentric left ventricular hypertrophy. Left ventricular diastolic  parameters are consistent with Grade I diastolic dysfunction (impaired  relaxation).   2. Right ventricular systolic function is normal. The right ventricular  size is normal. Tricuspid regurgitation signal is inadequate for assessing  PA pressure.   3. The mitral valve is normal in structure. Trivial mitral valve  regurgitation. No evidence of mitral stenosis.   4. The aortic valve is tricuspid. There is mild calcification of the  aortic valve. Aortic valve regurgitation is not visualized. No aortic  stenosis is present.   5. The inferior vena cava is dilated in size with >50% respiratory  variability, suggesting right atrial pressure of 8 mmHg.   FINDINGS   Left Ventricle: Left ventricular ejection fraction, by estimation, is 50  to 55%. The left ventricle has low normal function. The left ventricle  demonstrates regional wall motion abnormalities. Definity contrast agent  was given IV to delineate the left  ventricular endocardial borders. The left ventricular internal cavity size  was normal in size. There is mild concentric left ventricular hypertrophy.  Left ventricular diastolic parameters are consistent with Grade I  diastolic dysfunction (impaired  relaxation).   Right Ventricle: The right ventricular size is normal. No increase in  right ventricular wall thickness. Right ventricular systolic function is  normal. Tricuspid regurgitation signal is inadequate for assessing  PA  pressure.   Left Atrium: Left atrial size was normal in size.   Right Atrium: Right atrial size was normal in size.   Pericardium: There is no evidence of pericardial effusion.   Mitral Valve: The mitral valve is normal in structure. Trivial mitral  valve regurgitation. No  evidence of mitral valve stenosis. MV peak  gradient, 2.9 mmHg. The mean mitral valve gradient is 1.0 mmHg.   Tricuspid Valve: The tricuspid valve is normal in structure. Tricuspid  valve regurgitation is trivial.   Aortic Valve: The aortic valve is tricuspid. There is mild calcification  of the aortic valve. Aortic valve regurgitation is not visualized. No  aortic stenosis is present.   Pulmonic Valve: The pulmonic valve was normal in structure. Pulmonic valve  regurgitation is not visualized.   Aorta: The aortic root is normal in size and structure.   Venous: The inferior vena cava is dilated in size with greater than 50%  respiratory variability, suggesting right atrial pressure of 8 mmHg.   IAS/Shunts: No atrial level shunt detected by color flow Doppler.    History of Present Illness     [name] [name] is a 62 y.o. male with with hx non-obs dz by cardiac CT 2021, DM, resistant HTN, HLD, anxiety/depression, OSA, and had been doing well crom cardiac standpoint.  He presented to ER 07/25/23  after waking with chest pain at 0300 and with SOB.  He later developed nausea.  Pain was 10/10.  He took 4 ASA of 200 mg each.  He felt worse and came to ER.  In ER given IV heparin 4000 u and EKF with ST elevation inf MI.  STEMI was called and pt taken to cath lab.   He continued with pain and cath revealed 100% stenosis in distal LCX and mid LCX of 80%.  He had PCI to both these sites and tolerated the procedure well.   Also found to have 70% LAD stenosis.  Pt admitted to [facility] ICU.  Hospital Course     Consultants: IM TS with Dr. Sloan Leiter   STEMI - pt doing well today no chest pain. Seen and evaluated by Dr. Excell Seltzer. Pt very stressed about his dog and no one to feed him.  Social worker consulted.  Pt demands to go home today.    --NSR, age-indeterminate anterior infarct. Since last tracing, inferolateral ST elevation has resolved -  --pk troponin 20,462 --EF stable 50-55% --has been seen by  cardiac rehab  --ASA and Brilinta 12 months.  On BB and high dose statin.   Lpa in process   Pt will be seen in office and arrangements will be made for outpt PCI to LAD in 2 weeks-    Uncontrolled DM/DKA --POC A1C of >14  pt placed on insulin drip when CBG was 686  Gap of 19   --consult with IM TS with Dr. Sloan Leiter plan to resume home insulin he was not taking prior to admit.   --he will take subcu insulin prior to discharge and then stay 2 hours after he agreed to this after discussing with Dr. Sloan Leiter.  HLD --LDL 181, HDL 37 T chol 240 TG 111 --on lipitor 80 mg     Meds through [facility]   Did the patient have an acute coronary syndrome (MI, NSTEMI, STEMI, etc) this admission?:  Yes  AHA/ACC Clinical Performance & Quality Measures: Aspirin prescribed? - Yes ADP Receptor Inhibitor (Plavix/Clopidogrel, Brilinta/Ticagrelor or Effient/Prasugrel) prescribed (includes medically managed patients)? - Yes Beta Blocker prescribed? - Yes High Intensity Statin (Lipitor 40-80mg  or Crestor 20-40mg ) prescribed? - Yes EF assessed during THIS hospitalization? - Yes For EF <40%, was ACEI/ARB prescribed? - Not Applicable (EF >/= 40%) For EF <40%, Aldosterone Antagonist (Spironolactone or Eplerenone) prescribed? - Not Applicable (EF >/= 40%) Cardiac Rehab Phase II ordered (including medically managed patients)? - Yes       The patient will be scheduled for a TOC follow up appointment in 10 days.  A message has been sent to the [facility] and Scheduling Pool at the office where the patient should be seen for follow up.  _____________  Discharge Vitals Blood pressure 114/60, pulse 76, temperature 98.3 F (36.8 C), temperature source Oral, resp. rate 17, height 5\' 10"  (1.778 m), weight 74.3 kg, SpO2 93%.  Filed Weights   07/25/23 1107 07/25/23 1300  Weight: 74.8 kg 74.3 kg    Labs & Radiologic Studies    CBC Recent Labs    07/25/23 1110 07/26/23 0536   WBC 13.9* 10.9*  NEUTROABS 10.6*  --   HGB 18.7* 15.6  HCT 54.5* 45.9  MCV 89.8 89.0  PLT 234 191   Basic Metabolic Panel Recent Labs    46/96/29 1949 07/26/23 0536  NA 132* 134*  K 3.3* 3.7  CL 103 106  CO2 18* 19*  GLUCOSE 396* 141*  BUN 17 19  CREATININE 1.23 0.91  CALCIUM 8.7* 8.5*  MG  --  2.1   Liver Function Tests Recent Labs    07/25/23 1110  AST 94*  ALT 60*  ALKPHOS 80  BILITOT 0.9  PROT 7.2  ALBUMIN 3.8   No results for input(s): "LIPASE", "AMYLASE" in the last 72 hours. High Sensitivity Troponin:   Recent Labs  Lab 07/25/23 1110 07/25/23 1316 07/26/23 0536  TROPONINIHS 13,933* 20,462* 9,727*    BNP Invalid input(s): "POCBNP" D-Dimer No results for input(s): "DDIMER" in the last 72 hours. Hemoglobin A1C No results for input(s): "HGBA1C" in the last 72 hours. Fasting Lipid Panel Recent Labs    07/25/23 1316  CHOL 240*  HDL 37*  LDLCALC 181*  TRIG 111  CHOLHDL 6.5   Thyroid Function Tests No results for input(s): "TSH", "T4TOTAL", "T3FREE", "THYROIDAB" in the last 72 hours.  Invalid input(s): "FREET3" _____________  ECHOCARDIOGRAM COMPLETE  Result Date: 07/26/2023    ECHOCARDIOGRAM REPORT   Patient Name:   Jadan Spraker Date of Exam: 07/26/2023 Medical Rec #:  [id number]      Height:       70.0 in Accession #:    [id number]     Weight:       163.8 lb Date of Birth:  November 04, 1961      BSA:          1.918 m Patient Age:    62 years       BP:           104/65 mmHg Patient Gender: M              HR:           79 bpm. Exam Location:  Inpatient Procedure: 2D Echo, Color Doppler, Cardiac Doppler and Intracardiac            Opacification Agent Indications:    Acute ischemic heart disease  History:        Patient has  prior history of Echocardiogram examinations, most                 recent 03/12/2020. CAD and Acute MI, Signs/Symptoms:Shortness of                 Breath and Chest Pain; Risk Factors:Hypertension, Diabetes,                 Dyslipidemia and  Sleep Apnea.  Sonographer:    Milda Smart Referring Phys: [phone] Jonie Burdell  Sonographer Comments: Image acquisition challenging due to respiratory motion. IMPRESSIONS  1. Left ventricular ejection fraction, by estimation, is 50 to 55%. The left ventricle has low normal function. The left ventricle demonstrates regional wall motion abnormalities with basal inferolateral and basal anterolateral akinesis. There is mild concentric left ventricular hypertrophy. Left ventricular diastolic parameters are consistent with Grade I diastolic dysfunction (impaired relaxation).  2. Right ventricular systolic function is normal. The right ventricular size is normal. Tricuspid regurgitation signal is inadequate for assessing PA pressure.  3. The mitral valve is normal in structure. Trivial mitral valve regurgitation. No evidence of mitral stenosis.  4. The aortic valve is tricuspid. There is mild calcification of the aortic valve. Aortic valve regurgitation is not visualized. No aortic stenosis is present.  5. The inferior vena cava is dilated in size with >50% respiratory variability, suggesting right atrial pressure of 8 mmHg. FINDINGS  Left Ventricle: Left ventricular ejection fraction, by estimation, is 50 to 55%. The left ventricle has low normal function. The left ventricle demonstrates regional wall motion abnormalities. Definity contrast agent was given IV to delineate the left ventricular endocardial borders. The left ventricular internal cavity size was normal in size. There is mild concentric left ventricular hypertrophy. Left ventricular diastolic parameters are consistent with Grade I diastolic dysfunction (impaired relaxation). Right Ventricle: The right ventricular size is normal. No increase in right ventricular wall thickness. Right ventricular systolic function is normal. Tricuspid regurgitation signal is inadequate for assessing PA pressure. Left Atrium: Left atrial size was normal in size. Right Atrium:  Right atrial size was normal in size. Pericardium: There is no evidence of pericardial effusion. Mitral Valve: The mitral valve is normal in structure. Trivial mitral valve regurgitation. No evidence of mitral valve stenosis. MV peak gradient, 2.9 mmHg. The mean mitral valve gradient is 1.0 mmHg. Tricuspid Valve: The tricuspid valve is normal in structure. Tricuspid valve regurgitation is trivial. Aortic Valve: The aortic valve is tricuspid. There is mild calcification of the aortic valve. Aortic valve regurgitation is not visualized. No aortic stenosis is present. Pulmonic Valve: The pulmonic valve was normal in structure. Pulmonic valve regurgitation is not visualized. Aorta: The aortic root is normal in size and structure. Venous: The inferior vena cava is dilated in size with greater than 50% respiratory variability, suggesting right atrial pressure of 8 mmHg. IAS/Shunts: No atrial level shunt detected by color flow Doppler.  LEFT VENTRICLE PLAX 2D LVIDd:         3.70 cm     Diastology LVIDs:         2.90 cm     LV e' medial:    4.68 cm/s LV PW:         1.90 cm     LV E/e' medial:  13.5 LV IVS:        1.20 cm     LV e' lateral:   4.68 cm/s LVOT diam:     2.40 cm     LV E/e' lateral: 13.5  LV SV:         75 LV SV Index:   39 LVOT Area:     4.52 cm  LV Volumes (MOD) LV vol d, MOD A2C: 68.7 ml LV vol d, MOD A4C: 58.7 ml LV vol s, MOD A4C: 27.3 ml LV SV MOD A4C:     58.7 ml RIGHT VENTRICLE RV Basal diam:  3.20 cm RV S prime:     10.30 cm/s TAPSE (M-mode): 1.9 cm LEFT ATRIUM             Index        RIGHT ATRIUM           Index LA diam:        4.00 cm 2.09 cm/m   RA Area:     12.55 cm LA Vol (A2C):   44.4 ml 23.16 ml/m  RA Volume:   30.70 ml  16.01 ml/m LA Vol (A4C):   36.8 ml 19.19 ml/m LA Biplane Vol: 41.1 ml 21.43 ml/m  AORTIC VALVE LVOT Vmax:   88.60 cm/s LVOT Vmean:  66.600 cm/s LVOT VTI:    0.166 m  AORTA Ao Root diam: 3.20 cm Ao Asc diam:  3.20 cm MITRAL VALVE MV Area (PHT): 2.60 cm    SHUNTS MV Area  VTI:   3.17 cm    Systemic VTI:  0.17 m MV Peak grad:  2.9 mmHg    Systemic Diam: 2.40 cm MV Mean grad:  1.0 mmHg MV Vmax:       0.86 m/s MV Vmean:      56.4 cm/s MV Decel Time: 292 msec MR Peak grad: 17.1 mmHg MR Vmax:      206.50 cm/s MV E velocity: 63.10 cm/s MV A velocity: 64.70 cm/s MV E/A ratio:  0.98 Dalton McleanMD Electronically signed by Wilfred Lacy Signature Date/Time: 07/26/2023/9:06:56 AM    Final    CARDIAC CATHETERIZATION  Result Date: 07/25/2023   Mid Cx to Dist Cx lesion is 100% stenosed.   Prox Cx to Mid Cx lesion is 80% stenosed.   Prox LAD lesion is 70% stenosed.   A drug-eluting stent was successfully placed using a SYNERGY XD 2.50X24.   A drug-eluting stent was successfully placed using a STENT MEGATRON 3.5X16.   Post intervention, there is a 0% residual stenosis.   Post intervention, there is a 0% residual stenosis.   LV end diastolic pressure is low.   The left ventricular ejection fraction is 50-55% by visual estimate.   Recommend uninterrupted dual antiplatelet therapy with Aspirin 81mg  daily and Ticagrelor 90mg  twice daily for a minimum of 12 months (ACS-Class I recommendation). Acute inferolateral MI secondary to occlusion of the distal circumflex, treated successful with primary PCI using a 2.5x24 mm Synergy DES Severe mid-circumflex stenosis, treated with a 3.5x16 mm Megatron DES (80%-->0) Moderately severe ulcerative plaque in the proximal LAD of 70% Nonobstructive RCA stenosis Mild segmental LV dysfunction with LVEF estimated at 50-55% Recommend: post-MI medical therapy. Consider staged IVUS +/- PCI of the proximal LAD   DG Chest Port 1 View  Result Date: 07/25/2023 CLINICAL DATA:  Provided history: Chest pain. EXAM: PORTABLE CHEST 1 VIEW COMPARISON:  Prior chest radiographs 03/08/2023 and earlier. FINDINGS: Heart size within normal limits. Aortic atherosclerosis. No appreciable airspace consolidation or pulmonary edema. No evidence of pleural effusion or pneumothorax. No  acute osseous abnormality identified. Degenerative changes of the spine. IMPRESSION: 1. No evidence of an acute cardiopulmonary abnormality. 2. Aortic atherosclerosis. Electronically Signed   By:  Jackey Loge D.O.   On: 07/25/2023 12:08   Disposition   Pt is being discharged home today in good condition.  Though AMA - with his uncontrolled DM it makes it more difficult.   Follow-up Plans & Appointments     Follow-up Information     Masters, Florentina Addison, DO Follow up on 08/02/2023.   Specialty: Internal Medicine Why: at1:15 pm at [facility] in IM clinic Contact information: [address] [place] [address] [phone]         Ronney Asters, NP Follow up on 08/03/2023.   Specialty: Cardiology Why: at 08:25 AM   this is cardiology   please try to arrive 15 min early to check in Contact information: [address] [place] [address] [phone]                Discharge Instructions     AMB Referral to Cardiac Rehabilitation - Phase II   Complete by: As directed    Diagnosis: STEMI   After initial evaluation and assessments completed: Virtual Based Care may be provided alone or in conjunction with Phase 2 Cardiac Rehab based on patient barriers.: Yes   Intensive Cardiac Rehabilitation (ICR) [facility] location only OR Traditional Cardiac Rehabilitation (TCR) *If criteria for ICR are not met will enroll in TCR [facility] only): Yes        Discharge Medications   Allergies as of 07/26/2023       Reactions   Pravastatin Other (See Comments)   Joint myalgias   Trazodone And Nefazodone    Had restless legs   Hydrochlorothiazide Other (See Comments)   lightheadedness   Metformin And Related Diarrhea        Medication List     STOP taking these medications    hydrALAZINE 25 MG tablet Commonly known as: APRESOLINE   Levemir FlexPen 100 UNIT/ML FlexPen Generic drug: insulin detemir   olmesartan 5 MG tablet Commonly known as: BENICAR   rOPINIRole 0.5  MG tablet Commonly known as: REQUIP       TAKE these medications    acetaminophen 325 MG tablet Commonly known as: TYLENOL Take 2 tablets (650 mg total) by mouth every 4 (four) hours as needed for headache or mild pain.   aspirin EC 81 MG tablet Take 1 tablet (81 mg total) by mouth daily. Swallow whole. Start taking on: July 27, 2023   atorvastatin 80 MG tablet Commonly known as: LIPITOR Take 1 tablet (80 mg total) by mouth daily. Start taking on: July 27, 2023 What changed:  how much to take how to take this when to take this additional instructions   carvedilol 3.125 MG tablet Commonly known as: COREG Take 1 tablet (3.125 mg total) by mouth 2 (two) times daily with a meal. What changed:  medication strength how much to take how to take this when to take this additional instructions   cetirizine 10 MG tablet Commonly known as: ZYRTEC Take 1 tablet (10 mg total) by mouth daily.   insulin glargine 100 unit/mL Sopn Commonly known as: LANTUS Inject 40 Units into the skin at bedtime.   insulin lispro 100 UNIT/ML KwikPen Commonly known as: HumaLOG KwikPen Inject 20 Units into the skin 3 (three) times daily.   Insulin Pen Needle 32G X 4 MM Misc Use with Levemir   nitroGLYCERIN 0.4 MG SL tablet Commonly known as: NITROSTAT Place 1 tablet (0.4 mg total) under the tongue every 5 (five) minutes as needed for chest pain.  ticagrelor 90 MG Tabs tablet Commonly known as: BRILINTA Take 1 tablet (90 mg total) by mouth 2 (two) times daily.           Outstanding Labs/Studies   BMP as outpt along with H&L in 6 weeks    Duration of Discharge Encounter   Greater than 30 minutes including physician time.  Signed, Nada Boozer, NP 07/26/2023, 12:13 PM  See progress note this same date. Appreciate medicine service evaluation and help with diabetes recommendations. Plan for DC today, close OP cardiology follow-up, and patient to return for IVUS/possible PCI of the  LAD. Meds reviewed as above and I agree. Would start ACE/ARB at OP follow-up if BP will tolerate at that time.   Tonny Bollman 07/26/2023 1:05 PM

## 2023-07-26 NOTE — Consult Note (Signed)
Date: 07/26/2023               Patient Name:  [name] [name] MRN: [id number]  DOB: November 12, 1961 Age / Sex: 62 y.o., male   PCP: Farris Has, MD         Requesting Physician: Dr. Tonny Bollman, MD    Consulting Reason:  DKA     Chief Complaint: chest pain  History of Present Illness:   Mr. Causby is a 62 year old with PMH of uncontrolled type 2 diabetes, HLD, and depression. He presented with chest pain 07/28 and was found to have inferior STEMI. He was taken to cardiac cath and received DES to distal circumflex. Hospital course was complicated by hyperglycemia. He was started on insulin drip.  Meds: Current Facility-Administered Medications  Medication Dose Route Frequency Provider Last Rate Last Admin   0.45 % NaCl with KCl 20 mEq / L infusion   Intravenous Continuous Tannu, Manasi, MD 50 mL/hr at 07/26/23 1043 Infusion Verify at 07/26/23 1043   0.9 %  sodium chloride infusion   Intravenous Continuous Margarita Grizzle, MD   Stopped at 07/25/23 1354   0.9 %  sodium chloride infusion  250 mL Intravenous PRN Tonny Bollman, MD       acetaminophen (TYLENOL) tablet 650 mg  650 mg Oral Q4H PRN Tonny Bollman, MD       aspirin EC tablet 81 mg  81 mg Oral Daily Tonny Bollman, MD   81 mg at 07/26/23 4098   atorvastatin (LIPITOR) tablet 80 mg  80 mg Oral Daily Barrett, Joline Salt, PA-C   80 mg at 07/26/23 1191   carvedilol (COREG) tablet 3.125 mg  3.125 mg Oral BID WC Barrett, Rhonda G, PA-C   3.125 mg at 07/26/23 4782   Chlorhexidine Gluconate Cloth 2 % PADS 6 each  6 each Topical Daily Tonny Bollman, MD   6 each at 07/26/23 0929   diazepam (VALIUM) tablet 5 mg  5 mg Oral Q8H PRN Tonny Bollman, MD       insulin glargine-yfgn [facility]) injection 35 Units  35 Units Subcutaneous STAT Nada Boozer R, NP       insulin regular, human (MYXREDLIN) 100 units/ 100 mL infusion   Intravenous Continuous Tannu, Manasi, MD 3.6 mL/hr at 07/26/23 1043 3.6 Units/hr at 07/26/23 1043   loratadine (CLARITIN)  tablet 10 mg  10 mg Oral Daily Barrett, Rhonda G, PA-C   10 mg at 07/26/23 9562   nitroGLYCERIN (NITROSTAT) SL tablet 0.4 mg  0.4 mg Sublingual Q5 min PRN Leone Brand, NP       ondansetron [facility]) injection 4 mg  4 mg Intravenous Q6H PRN Tonny Bollman, MD       Oral care mouth rinse  15 mL Mouth Rinse PRN Tonny Bollman, MD       oxyCODONE (Oxy IR/ROXICODONE) immediate release tablet 5-10 mg  5-10 mg Oral Q4H PRN Tonny Bollman, MD   5 mg at 07/26/23 0333   sodium chloride flush (NS) 0.9 % injection 3 mL  3 mL Intravenous Freada Bergeron, MD   3 mL at 07/26/23 0929   sodium chloride flush (NS) 0.9 % injection 3 mL  3 mL Intravenous PRN Tonny Bollman, MD       ticagrelor [facility]) tablet 90 mg  90 mg Oral BID Tonny Bollman, MD   90 mg at 07/26/23 1308    Allergies: Allergies as of 07/25/2023 - Review Complete 07/25/2023  Allergen Reaction Noted   Pravastatin Other (See  Comments) 11/29/2011   Trazodone and nefazodone  05/13/2020   Hydrochlorothiazide Other (See Comments) 03/31/2019   Metformin and related Diarrhea 03/31/2019   Past Medical History:  Diagnosis Date   Anxiety    Coronary artery disease    Depression    Diabetes mellitus    Type 2   Enlarged prostate    Environmental allergies    Environmental and seasonal allergies    GERD (gastroesophageal reflux disease)    Headache(784.0)    sinus and migraines   Hyperlipidemia    Hypertension    IBS (irritable bowel syndrome)    Low testosterone    S/P angioplasty with stent to distal LCX and mid LCX both DES 07/25/23 07/26/2023   Sleep apnea    has used BiPAP in past; last sleep study >10 years   Past Surgical History:  Procedure Laterality Date   COLONOSCOPY  09/2018   normal, repeat in 10 years per patient.  [place], [place]   CORONARY/GRAFT ACUTE MI REVASCULARIZATION N/A 07/25/2023   Procedure: Coronary/Graft Acute MI Revascularization;  Surgeon: Tonny Bollman, MD;  Location: [facility] INVASIVE CV LAB;  Service:  Cardiovascular;  Laterality: N/A;   KNEE ARTHROSCOPY Left    LEFT HEART CATH AND CORONARY ANGIOGRAPHY N/A 07/25/2023   Procedure: LEFT HEART CATH AND CORONARY ANGIOGRAPHY;  Surgeon: Tonny Bollman, MD;  Location: [facility] INVASIVE CV LAB;  Service: Cardiovascular;  Laterality: N/A;   RENAL ANGIOGRAPHY N/A 07/16/2020   Procedure: RENAL ANGIOGRAPHY;  Surgeon: Yates Decamp, MD;  Location: [facility] INVASIVE CV LAB;  Service: Cardiovascular;  Laterality: N/A;   SACROILIAC JOINT FUSION Right 05/02/2014   Procedure: SACROILIAC JOINT FUSION;  Surgeon: Emilee Hero, MD;  Location: [facility] OR;  Service: Orthopedics;  Laterality: Right;  Right sided sacroiliac joint fusion   TONSILLECTOMY     uvula removed   Family History  Problem Relation Age of Onset   Cancer Mother        lung   Cancer Father        prostate, mets to bone, lung   Cancer Brother        melanoma   Heart disease Maternal Grandmother        MI   Lung disease Maternal Grandfather        black lung   Heart disease Paternal Grandmother    Stroke Paternal Grandmother    Heart disease Paternal Grandfather    Heart disease Brother 72       MI   Diabetes Brother    Social History   Socioeconomic History   Marital status: Single    Spouse name: Not on file   Number of children: Not on file   Years of education: Not on file   Highest education level: Not on file  Occupational History   Not on file  Tobacco Use   Smoking status: Former    Current packs/day: 0.00    Average packs/day: 1 pack/day for 10.0 years (10.0 ttl pk-yrs)    Types: Cigarettes    Start date: 04/20/1982    Quit date: 04/20/1992    Years since quitting: 31.2   Smokeless tobacco: Never  Vaping Use   Vaping status: Never Used  Substance and Sexual Activity   Alcohol use: No   Drug use: No   Sexual activity: Not on file  Other Topics Concern   Not on file  Social History Narrative   Not on file   Social Determinants of Health   Financial Resource Strain: Not  on file  Food Insecurity: Not on file  Transportation Needs: Not on file  Physical Activity: Unknown (03/20/2019)   Exercise Vital Sign    Days of Exercise per Week: 0 days    Minutes of Exercise per Session: Not on file  Stress: Not on file  Social Connections: Unknown (05/11/2022)   Received from [facility]   Social Network    Social Network: Not on file  Intimate Partner Violence: Unknown (04/01/2022)   Received from [facility]   HITS    Physically Hurt: Not on file    Insult or Talk Down To: Not on file    Threaten Physical Harm: Not on file    Scream or Curse: Not on file    Review of Systems: Denies nausea, vomiting  Physical Exam: Blood pressure 114/60, pulse 76, temperature 98.3 F (36.8 C), temperature source Oral, resp. rate 17, height 5\' 10"  (1.778 m), weight 74.3 kg, SpO2 93%. Constitutional: well-appearing, in no acute distress Cardiovascular: regular rate and rhythm, no m/r/g Pulmonary/Chest: normal work of breathing on room air, lungs clear to auscultation bilaterally Abdominal: soft, non-tender, non-distended MSK: normal bulk and tone  Imaging results:  ECHOCARDIOGRAM COMPLETE  Result Date: 07/26/2023    ECHOCARDIOGRAM REPORT   Patient Name:   Matas Floor Date of Exam: 07/26/2023 Medical Rec #:  [id number]      Height:       70.0 in Accession #:    [id number]     Weight:       163.8 lb Date of Birth:  August 30, 1961      BSA:          1.918 m Patient Age:    62 years       BP:           104/65 mmHg Patient Gender: M              HR:           79 bpm. Exam Location:  Inpatient Procedure: 2D Echo, Color Doppler, Cardiac Doppler and Intracardiac            Opacification Agent Indications:    Acute ischemic heart disease  History:        Patient has prior history of Echocardiogram examinations, most                 recent 03/12/2020. CAD and Acute MI, Signs/Symptoms:Shortness of                 Breath and Chest Pain; Risk Factors:Hypertension, Diabetes,                  Dyslipidemia and Sleep Apnea.  Sonographer:    Milda Smart Referring Phys: [phone] MICHAEL COOPER  Sonographer Comments: Image acquisition challenging due to respiratory motion. IMPRESSIONS  1. Left ventricular ejection fraction, by estimation, is 50 to 55%. The left ventricle has low normal function. The left ventricle demonstrates regional wall motion abnormalities with basal inferolateral and basal anterolateral akinesis. There is mild concentric left ventricular hypertrophy. Left ventricular diastolic parameters are consistent with Grade I diastolic dysfunction (impaired relaxation).  2. Right ventricular systolic function is normal. The right ventricular size is normal. Tricuspid regurgitation signal is inadequate for assessing PA pressure.  3. The mitral valve is normal in structure. Trivial mitral valve regurgitation. No evidence of mitral stenosis.  4. The aortic valve is tricuspid. There is mild calcification of the aortic valve. Aortic valve regurgitation is not visualized. No aortic  stenosis is present.  5. The inferior vena cava is dilated in size with >50% respiratory variability, suggesting right atrial pressure of 8 mmHg. FINDINGS  Left Ventricle: Left ventricular ejection fraction, by estimation, is 50 to 55%. The left ventricle has low normal function. The left ventricle demonstrates regional wall motion abnormalities. Definity contrast agent was given IV to delineate the left ventricular endocardial borders. The left ventricular internal cavity size was normal in size. There is mild concentric left ventricular hypertrophy. Left ventricular diastolic parameters are consistent with Grade I diastolic dysfunction (impaired relaxation). Right Ventricle: The right ventricular size is normal. No increase in right ventricular wall thickness. Right ventricular systolic function is normal. Tricuspid regurgitation signal is inadequate for assessing PA pressure. Left Atrium: Left atrial size was normal in  size. Right Atrium: Right atrial size was normal in size. Pericardium: There is no evidence of pericardial effusion. Mitral Valve: The mitral valve is normal in structure. Trivial mitral valve regurgitation. No evidence of mitral valve stenosis. MV peak gradient, 2.9 mmHg. The mean mitral valve gradient is 1.0 mmHg. Tricuspid Valve: The tricuspid valve is normal in structure. Tricuspid valve regurgitation is trivial. Aortic Valve: The aortic valve is tricuspid. There is mild calcification of the aortic valve. Aortic valve regurgitation is not visualized. No aortic stenosis is present. Pulmonic Valve: The pulmonic valve was normal in structure. Pulmonic valve regurgitation is not visualized. Aorta: The aortic root is normal in size and structure. Venous: The inferior vena cava is dilated in size with greater than 50% respiratory variability, suggesting right atrial pressure of 8 mmHg. IAS/Shunts: No atrial level shunt detected by color flow Doppler.  LEFT VENTRICLE PLAX 2D LVIDd:         3.70 cm     Diastology LVIDs:         2.90 cm     LV e' medial:    4.68 cm/s LV PW:         1.90 cm     LV E/e' medial:  13.5 LV IVS:        1.20 cm     LV e' lateral:   4.68 cm/s LVOT diam:     2.40 cm     LV E/e' lateral: 13.5 LV SV:         75 LV SV Index:   39 LVOT Area:     4.52 cm  LV Volumes (MOD) LV vol d, MOD A2C: 68.7 ml LV vol d, MOD A4C: 58.7 ml LV vol s, MOD A4C: 27.3 ml LV SV MOD A4C:     58.7 ml RIGHT VENTRICLE RV Basal diam:  3.20 cm RV S prime:     10.30 cm/s TAPSE (M-mode): 1.9 cm LEFT ATRIUM             Index        RIGHT ATRIUM           Index LA diam:        4.00 cm 2.09 cm/m   RA Area:     12.55 cm LA Vol (A2C):   44.4 ml 23.16 ml/m  RA Volume:   30.70 ml  16.01 ml/m LA Vol (A4C):   36.8 ml 19.19 ml/m LA Biplane Vol: 41.1 ml 21.43 ml/m  AORTIC VALVE LVOT Vmax:   88.60 cm/s LVOT Vmean:  66.600 cm/s LVOT VTI:    0.166 m  AORTA Ao Root diam: 3.20 cm Ao Asc diam:  3.20 cm MITRAL VALVE MV Area (PHT): 2.60 cm  SHUNTS MV Area VTI:   3.17 cm    Systemic VTI:  0.17 m MV Peak grad:  2.9 mmHg    Systemic Diam: 2.40 cm MV Mean grad:  1.0 mmHg MV Vmax:       0.86 m/s MV Vmean:      56.4 cm/s MV Decel Time: 292 msec MR Peak grad: 17.1 mmHg MR Vmax:      206.50 cm/s MV E velocity: 63.10 cm/s MV A velocity: 64.70 cm/s MV E/A ratio:  0.98 Dalton McleanMD Electronically signed by Wilfred Lacy Signature Date/Time: 07/26/2023/9:06:56 AM    Final    CARDIAC CATHETERIZATION  Result Date: 07/25/2023   Mid Cx to Dist Cx lesion is 100% stenosed.   Prox Cx to Mid Cx lesion is 80% stenosed.   Prox LAD lesion is 70% stenosed.   A drug-eluting stent was successfully placed using a SYNERGY XD 2.50X24.   A drug-eluting stent was successfully placed using a STENT MEGATRON 3.5X16.   Post intervention, there is a 0% residual stenosis.   Post intervention, there is a 0% residual stenosis.   LV end diastolic pressure is low.   The left ventricular ejection fraction is 50-55% by visual estimate.   Recommend uninterrupted dual antiplatelet therapy with Aspirin 81mg  daily and Ticagrelor 90mg  twice daily for a minimum of 12 months (ACS-Class I recommendation). Acute inferolateral MI secondary to occlusion of the distal circumflex, treated successful with primary PCI using a 2.5x24 mm Synergy DES Severe mid-circumflex stenosis, treated with a 3.5x16 mm Megatron DES (80%-->0) Moderately severe ulcerative plaque in the proximal LAD of 70% Nonobstructive RCA stenosis Mild segmental LV dysfunction with LVEF estimated at 50-55% Recommend: post-MI medical therapy. Consider staged IVUS +/- PCI of the proximal LAD   DG Chest Port 1 View  Result Date: 07/25/2023 CLINICAL DATA:  Provided history: Chest pain. EXAM: PORTABLE CHEST 1 VIEW COMPARISON:  Prior chest radiographs 03/08/2023 and earlier. FINDINGS: Heart size within normal limits. Aortic atherosclerosis. No appreciable airspace consolidation or pulmonary edema. No evidence of pleural effusion or  pneumothorax. No acute osseous abnormality identified. Degenerative changes of the spine. IMPRESSION: 1. No evidence of an acute cardiopulmonary abnormality. 2. Aortic atherosclerosis. Electronically Signed   By: Jackey Loge D.O.   On: 07/25/2023 12:08     Assessment, Plan, & Recommendations by Problem: Principal Problem:   STEMI involving left circumflex coronary artery (HCC) Active Problems:   HLD (hyperlipidemia)   DM (diabetes mellitus), type 2, uncontrolled, with hyperosmolarity (HCC)   CAD in native artery   S/P angioplasty with stent to distal LCX and mid LCX both DES 07/25/23  STEMI s/p DES to distal circumflex Taken to cath lab 07/28 and PCI done. He reports pain is improved from yesterday.  Hyperglycemia/ possible mild DKA Initial bicarb at 15 with AG of 19 with lactic acid of 3. Troponin elevated in 29528 with peak 20000. EKG consistent with inferior MI. His blood sugar remained elevated with SSI. UA and BHO showed ketones with repeat BMP of bicarb at 18 with no anion gap. He was started on insulin drip overnight. Last BMP at 5AM showed bicarb 19 with anion gap of 9.  He has a glucometer at home with supplies to check blood sugars. I talked with him about checking blood sugar in morning before eating breakfast and before each meal. He will bring glucometer to [facility] on 08/05. He had GI side effects in the past with metformin. SGLT2 was not affordable in past. Patient wants to  leave as soon as possible but was willing to stay to have blood work rechecked and insulin supplies brought to bedside.  Anion gap closed on 5AM BMP. He remains on insulin drip. Repeat BMP shows improving bicarb with anion gap 10. Long acting insulin given with glargine 35 units given. Will transition off of endotool and STOP insulin drip 2 hours after long acting insulin given. Long acting insulin has been sent to [facility] pharmacy.  Overall, I think that initial AG was 2.2 to Lactic acidosis in setting of MI. BHO could  be from resolving LA. If this picture was DKA, I think it was very early as he did not fully meet criteria for this.  -STAT BMP  -start glargine 40 units at bedtime -start lispro 20 units TID -follow-up at [facility] on 8/5 at 1:15 with Dr. August Saucer, will work on getting him to diabetes coordinator and consider starting SGLT2 vs GLP-1 at follow-up.  I think that he is still a candidate for SGLT2 if these are affordable as clinical picture.   Signed: Rudene Christians, DO 07/26/2023, 11:57 AM

## 2023-07-26 NOTE — Telephone Encounter (Signed)
Patient is still currently admitted.  Will need call tomorrow

## 2023-07-26 NOTE — Progress Notes (Signed)
Rounding Note    Patient Name: [name] [name] Date of Encounter: 07/26/2023   [facility] Cardiologist: Tonny Bollman, MD   Subjective   Feels pretty good this morning.  Still with some mild jaw and back discomfort but no further chest discomfort.  No shortness of breath.  Patient started on an insulin drip overnight.  Inpatient Medications    Scheduled Meds:  aspirin EC  81 mg Oral Daily   atorvastatin  80 mg Oral Daily   carvedilol  3.125 mg Oral BID WC   Chlorhexidine Gluconate Cloth  6 each Topical Daily   loratadine  10 mg Oral Daily   sodium chloride flush  3 mL Intravenous Q12H   ticagrelor  90 mg Oral BID   Continuous Infusions:  0.45 % NaCl with KCl 20 mEq / L 50 mL/hr at 07/26/23 0744   sodium chloride Stopped (07/25/23 1354)   sodium chloride     insulin 3.8 Units/hr (07/26/23 0744)   PRN Meds: sodium chloride, acetaminophen, diazepam, morphine injection, ondansetron (ZOFRAN) IV, mouth rinse, oxyCODONE, sodium chloride flush   Vital Signs    Vitals:   07/26/23 0545 07/26/23 0600 07/26/23 0736 07/26/23 0737  BP:  112/74 102/68   Pulse: 67 71 83   Resp: 17 16    Temp:    98.3 F (36.8 C)  TempSrc:    Oral  SpO2: 96% 96%    Weight:      Height:        Intake/Output Summary (Last 24 hours) at 07/26/2023 0813 Last data filed at 07/26/2023 0744 Gross per 24 hour  Intake 2671.29 ml  Output 1625 ml  Net 1046.29 ml      07/25/2023    1:00 PM 07/25/2023   11:07 AM 04/08/2023    3:03 PM  Last 3 Weights  Weight (lbs) 163 lb 12.8 oz 165 lb 265 lb 6.4 oz  Weight (kg) 74.3 kg 74.844 kg 120.385 kg      Telemetry    This rhythm without significant arrhythmia - Personally Reviewed  ECG    NSR, age-indeterminate anterior infarct. Since last tracing, inferolateral ST elevation has resolved - Personally Reviewed  Physical Exam  Alert, oriented, NAD GEN: No acute distress.   Neck: No JVD Cardiac: RRR, no murmurs, rubs, or gallops.   Respiratory: Clear to auscultation bilaterally. GI: Soft, nontender, non-distended  MS: No edema; No deformity. Neuro:  Nonfocal  Psych: Normal affect   Labs    High Sensitivity Troponin:   Recent Labs  Lab 07/25/23 1110 07/25/23 1316 07/26/23 0536  TROPONINIHS 13,933* 20,462* 9,727*     Chemistry Recent Labs  Lab 07/25/23 1110 07/25/23 1949 07/26/23 0536  NA 133* 132* 134*  K 4.3 3.3* 3.7  CL 99 103 106  CO2 15* 18* 19*  GLUCOSE 412* 396* 141*  BUN 15 17 19   CREATININE 1.05 1.23 0.91  CALCIUM 9.2 8.7* 8.5*  MG  --   --  2.1  PROT 7.2  --   --   ALBUMIN 3.8  --   --   AST 94*  --   --   ALT 60*  --   --   ALKPHOS 80  --   --   BILITOT 0.9  --   --   GFRNONAA >60 >60 >60  ANIONGAP 19* 11 9    Lipids  Recent Labs  Lab 07/25/23 1316  CHOL 240*  TRIG 111  HDL 37*  LDLCALC 181*  CHOLHDL 6.5  Hematology Recent Labs  Lab 07/25/23 1110 07/26/23 0536  WBC 13.9* 10.9*  RBC 6.07* 5.16  HGB 18.7* 15.6  HCT 54.5* 45.9  MCV 89.8 89.0  MCH 30.8 30.2  MCHC 34.3 34.0  RDW 13.1 13.2  PLT 234 191   Thyroid No results for input(s): "TSH", "FREET4" in the last 168 hours.  BNPNo results for input(s): "BNP", "PROBNP" in the last 168 hours.  DDimer No results for input(s): "DDIMER" in the last 168 hours.   Radiology    CARDIAC CATHETERIZATION  Result Date: 07/25/2023   Mid Cx to Dist Cx lesion is 100% stenosed.   Prox Cx to Mid Cx lesion is 80% stenosed.   Prox LAD lesion is 70% stenosed.   A drug-eluting stent was successfully placed using a SYNERGY XD 2.50X24.   A drug-eluting stent was successfully placed using a STENT MEGATRON 3.5X16.   Post intervention, there is a 0% residual stenosis.   Post intervention, there is a 0% residual stenosis.   LV end diastolic pressure is low.   The left ventricular ejection fraction is 50-55% by visual estimate.   Recommend uninterrupted dual antiplatelet therapy with Aspirin 81mg  daily and Ticagrelor 90mg  twice daily for a  minimum of 12 months (ACS-Class I recommendation). Acute inferolateral MI secondary to occlusion of the distal circumflex, treated successful with primary PCI using a 2.5x24 mm Synergy DES Severe mid-circumflex stenosis, treated with a 3.5x16 mm Megatron DES (80%-->0) Moderately severe ulcerative plaque in the proximal LAD of 70% Nonobstructive RCA stenosis Mild segmental LV dysfunction with LVEF estimated at 50-55% Recommend: post-MI medical therapy. Consider staged IVUS +/- PCI of the proximal LAD   DG Chest Port 1 View  Result Date: 07/25/2023 CLINICAL DATA:  Provided history: Chest pain. EXAM: PORTABLE CHEST 1 VIEW COMPARISON:  Prior chest radiographs 03/08/2023 and earlier. FINDINGS: Heart size within normal limits. Aortic atherosclerosis. No appreciable airspace consolidation or pulmonary edema. No evidence of pleural effusion or pneumothorax. No acute osseous abnormality identified. Degenerative changes of the spine. IMPRESSION: 1. No evidence of an acute cardiopulmonary abnormality. 2. Aortic atherosclerosis. Electronically Signed   By: Jackey Loge D.O.   On: 07/25/2023 12:08    Cardiac Studies   2D Echo pending  Patient Profile     62 y.o. male with uncontrolled diabetes, off of all medications for the past year, presents with inferolateral STEMI  Assessment & Plan    STEMI involving the LCx: s/p PCI of the circumflex with 2 DES. Now on ASA and ticagrelor. Need social work consultation for medication assistance.  Patient reports that he now has Medicaid. Diabetes, uncontrolled: need to get him established with PCP. Diabetes coordinator consulted. HgB A1C > 14.  Started on insulin drip overnight.  Patient with ketones in his urine, but I am not sure he really has DKA.  Will ask for internal medicine consultation. Hyperlipidemia: LDL 181. Start high-intensity statin.  CAD: residual CAD with ulcerated plaque and moderately severe stenosis in the proximal LAD. Pt adamant about going home. He  has a dog and nobody else to care for the dog. He appears clinically stable, but should be brought back for repeat cath with IVUS and possible PCI of the proximal LAD.  Really difficult social situation.  The patient desperately needs to get home to his dog.  He does not have anyone who can go into his house and help with this.  Hopefully we can get his medications straightened out and get him home.  Will ask  for internal medicine consult and social work consult.  Will need to arrange close outpatient cardiology follow-up.  I am not sure we can get him home today with the uncontrolled diabetes.  For questions or updates, please contact [facility] Please consult [email or web address] for contact info under        Signed, Tonny Bollman, MD  07/26/2023, 8:13 AM

## 2023-07-26 NOTE — Telephone Encounter (Signed)
   Transition of Care Follow-up Phone Call Request    Patient Name: [name] Date of Birth: 20-Apr-1961 Date of Encounter: 07/26/2023  Primary Care Provider:  Farris Has, MD Primary Cardiologist:  Tonny Bollman, MD  [name] has been scheduled for a transition of care follow up appointment with a [facility] provider:  Verdon Cummins 08/03/23 post STEMI  Pt is leaving this afternoon - not yet discharged.  Thank you  Please reach out to [name] within 48 hours to confirm appointment and review transition of care protocol questionnaire.  Nada Boozer, NP  07/26/2023, 1:09 PM

## 2023-07-26 NOTE — Plan of Care (Signed)
  Problem: Education: Goal: Knowledge of General Education information will improve Description: Including pain rating scale, medication(s)/side effects and non-pharmacologic comfort measures Outcome: Progressing   Problem: Clinical Measurements: Goal: Ability to maintain clinical measurements within normal limits will improve Outcome: Progressing   Problem: Coping: Goal: Level of anxiety will decrease Outcome: Progressing   

## 2023-07-26 NOTE — Inpatient Diabetes Management (Addendum)
Inpatient Diabetes Program Recommendations  AACE/ADA: New Consensus Statement on Inpatient Glycemic Control (2015)  Target Ranges:  Prepandial:   less than 140 mg/dL      Peak postprandial:   less than 180 mg/dL (1-2 hours)      Critically ill patients:  140 - 180 mg/dL   Lab Results  Component Value Date   GLUCAP 143 (H) 07/26/2023   HGBA1C >14.0 (A) 03/25/2023    Review of Glycemic Control  Latest Reference Range & Units 07/26/23 04:29 07/26/23 05:23 07/26/23 06:28  Glucose-Capillary 70 - 99 mg/dL 161 (H) 096 (H) 045 (H)   Diabetes history: DM 2 Outpatient Diabetes medications:  Levemir 40 units/Humalog 10 units tid- it does not appear patient was taking Current orders for Inpatient glycemic control:  IV insulin (goal 110-140) Inpatient Diabetes Program Recommendations:   Agree with use of IV insulin - DKA orders.  Will follow.   1345- Spoke with RN. A1C>15.5%. Patient wants to go home due to having pet that is unattended.  Charge nurse with patient and he was crying.  Went in room and discussed use of insulin.  He has been unable to afford it for the past year and 1 1/2. TOC pharmacy has delivered insulins including Lantus and Novolog to bedside.  Patient states he has used insulin pens in the past and has meter/strips at home.  He was very tearful.  Press photographer and RN continuing to work on D/C plan.  Thanks,  Beryl Meager, RN, BC-ADM Inpatient Diabetes Coordinator Pager [phone]  (8a-5p)

## 2023-07-26 NOTE — Discharge Instructions (Addendum)
Heart Healthy Diabetic Diet   Call [facility] at [phone] if any bleeding, swelling or drainage at cath site.  May shower, no tub baths for 48 hours for groin sticks. No lifting over 5 pounds for 3 days.  No Driving for 5 days   Do not miss Brilinta and asprin- they keep your stent open, stopping could cause a heart attack  Take 1 NTG, under your tongue, while sitting.  If no relief of pain may repeat NTG, one tab every 5 minutes up to 3 tablets total over 15 minutes.  If no relief CALL 911.  If you have dizziness/lightheadness  while taking NTG, stop taking and call 911.      Do not hesitate to come to ER if return of chest pain.

## 2023-07-26 NOTE — TOC CM/SW Note (Signed)
..  07/26/2023  [name] DOB: August 17, 1961 MRN: [id number]   RIDER WAIVER AND RELEASE OF LIABILITY  For the purposes of helping with transportation needs, [facility] partners with outside transportation providers (taxi companies, [place], Catering manager.) to give [employer] patients or other approved people the choice of on-demand rides [employer]") to our buildings for non-emergency visits.  By using [employer], I, the person signing this document, on behalf of myself and/or any legal minors (in my care using the [employer]), agree:  Science writer given to me are supplied by independent, outside transportation providers who do not work for, or have any affiliation with, [employer]. [employer] is not a transportation company. [facility] has no control over the quality or safety of the rides I get using [employer]. [facility] has no control over whether any outside ride will happen on time or not. [employer] gives no guarantee on the reliability, quality, safety, or availability on any rides, or that no mistakes will happen. I know and accept that traveling by vehicle (car, truck, SVU, Zenaida Niece, bus, taxi, etc.) has risks of serious injuries such as disability, being paralyzed, and death. I know and agree the risk of using [employer] is mine alone, and not [employer]. Transport Services are provided "as is" and as are available. The transportation providers are in charge for all inspections and care of the vehicles used to provide these rides. I agree not to take legal action against [place], its agents, employees, officers, directors, representatives, insurers, attorneys, assigns, successors, subsidiaries, and affiliates at any time for any reasons related directly or indirectly to using [employer]. I also agree not to take legal action against [facility] or its affiliates for any injury, death, or damage to property caused by or related to using  [employer]. I have read this Waiver and Release of Liability, and I understand the terms used in it and their legal meaning. This Waiver is freely and voluntarily given with the understanding that my right (or any legal minors) to legal action against [place] relating to [employer] is knowingly given up to use these services.   I attest that I read the Ride Waiver and Release of Liability to [name], gave Mr. Gantert the opportunity to ask questions and answered the questions asked (if any). I affirm that [name] then provided consent for assistance with transportation.

## 2023-07-26 NOTE — Progress Notes (Signed)
CSW received consult for patient. CSW spoke with patient at bedside. CSW offered patient [facility] resource guide. Patient accepted. All questions answered. No further questions reported at this time.

## 2023-07-27 ENCOUNTER — Telehealth: Payer: Self-pay

## 2023-07-27 ENCOUNTER — Telehealth (HOSPITAL_COMMUNITY): Payer: Self-pay

## 2023-07-27 NOTE — Telephone Encounter (Signed)
Call to patient and he states to call back in 30 minutes as he is unable to talk at the moment.

## 2023-07-27 NOTE — Telephone Encounter (Signed)
Pt is not interested in the cardiac rehab program. Closed referral 

## 2023-07-27 NOTE — Transitions of Care (Post Inpatient/ED Visit) (Signed)
   07/27/2023  Name: [name] [name] MRN: [id number] DOB: 1961-07-13  Today's TOC FU Call Status: Today's TOC FU Call Status:: Successful TOC FU Call Competed TOC FU Call Complete Date: 07/27/23  Transition Care Management Follow-up Telephone Call Date of Discharge: 07/26/23 Discharge [facility] Type of Discharge: Inpatient Admission Primary Inpatient Discharge Diagnosis:: STEMI How have you been since you were released from the hospital?: Better Any questions or concerns?: No  Items Reviewed: Did you receive and understand the discharge instructions provided?: Yes Medications obtained,verified, and reconciled?: Yes (Medications Reviewed) Any new allergies since your discharge?: No Dietary orders reviewed?: Yes  Medications Reviewed Today: Medications Reviewed Today     Reviewed by Karena Addison, LPN (Licensed Practical Nurse) on 07/27/23 at [phone]  Med List Status: <None>   Medication Order Taking? Sig Documenting Provider Last Dose Status Informant  acetaminophen (TYLENOL) 325 MG tablet [id number]  Take 2 tablets (650 mg total) by mouth every 4 (four) hours as needed for headache or mild pain. Leone Brand, NP  Active   aspirin EC 81 MG tablet [id number]  Take 1 tablet (81 mg total) by mouth daily. Swallow whole. Leone Brand, NP  Active   atorvastatin (LIPITOR) 80 MG tablet [id number]  Take 1 tablet (80 mg total) by mouth daily. Leone Brand, NP  Active   carvedilol (COREG) 3.125 MG tablet [id number]  Take 1 tablet (3.125 mg total) by mouth 2 (two) times daily with a meal. Leone Brand, NP  Active   cetirizine (ZYRTEC) 10 MG tablet [id number]  Take 1 tablet (10 mg total) by mouth daily. Leone Brand, NP  Active   insulin glargine (LANTUS) 100 unit/mL SOPN [id number]  Inject 40 Units into the skin at bedtime. Masters, Florentina Addison, DO  Active   insulin lispro (HUMALOG KWIKPEN) 100 UNIT/ML KwikPen [id number]  Inject 20 Units into the skin 3 (three) times daily.  Masters, Florentina Addison, DO  Active   Insulin Pen Needle 32G X 4 MM MISC [id number]  Use with Levemir   Active   nitroGLYCERIN (NITROSTAT) 0.4 MG SL tablet [id number]  Place 1 tablet (0.4 mg total) under the tongue every 5 (five) minutes as needed for chest pain. Leone Brand, NP  Active   ticagrelor (BRILINTA) 90 MG TABS tablet [id number]  Take 1 tablet (90 mg total) by mouth 2 (two) times daily. Leone Brand, NP  Active             Home Care and Equipment/Supplies: Were Home Health Services Ordered?: NA Any new equipment or medical supplies ordered?: NA  Functional Questionnaire: Do you need assistance with bathing/showering or dressing?: No Do you need assistance with meal preparation?: No Do you need assistance with eating?: No Do you have difficulty maintaining continence: No Do you need assistance with getting out of bed/getting out of a chair/moving?: No Do you have difficulty managing or taking your medications?: No  Follow up appointments reviewed: PCP Follow-up appointment confirmed?: Yes Date of PCP follow-up appointment?: 08/02/23 Follow-up Provider: [facility] Follow-up appointment confirmed?: Yes Date of Specialist follow-up appointment?: 08/03/23 Follow-Up Specialty Provider:: Cardio Do you need transportation to your follow-up appointment?: No Do you understand care options if your condition(s) worsen?: Yes-patient verbalized understanding    SIGNATURE Karena Addison, LPN [facility] Nurse Health Advisor Direct Dial [phone]

## 2023-07-28 NOTE — Telephone Encounter (Signed)
Follow Up:     Patient is returning call from this morning. 

## 2023-07-28 NOTE — Telephone Encounter (Signed)
Patient contacted regarding discharge from [facility] on 07/27/23.  Patient understands to follow up with provider Melburn Popper on 08/03/2023 at 08:25 am at [facility]. Patient understands discharge instructions? No. He stated it was nothing but chaos and nobody explained anything to him. Discussed discharge instructions. He verbalized understanding. Patient understands medications and regiment? No. Patient stated no one went over medications with him. We discussed medication changes. He was not happy. He verbalized understanding.  Patient understands to bring all medications to this visit? yes  Ask patient:  Are you enrolled in My Chart Yes but he does use it

## 2023-07-28 NOTE — Telephone Encounter (Signed)
Left voicemail for patient to return call to office. 

## 2023-08-01 NOTE — Progress Notes (Unsigned)
Cardiology Clinic Note   Patient Name: [name] [name] Date of Encounter: 08/03/2023  Primary Care Provider:  Farris Has, MD Primary Cardiologist:  Tonny Bollman, MD  Patient Profile    [name] [name] 62 year old male presents to the clinic today for follow-up evaluation of his coronary artery disease status post STEMI on 07/25/2023.  Past Medical History    Past Medical History:  Diagnosis Date   Anxiety    Coronary artery disease    Depression    Diabetes mellitus    Type 2   Enlarged prostate    Environmental allergies    Environmental and seasonal allergies    GERD (gastroesophageal reflux disease)    Headache(784.0)    sinus and migraines   Hyperlipidemia    Hypertension    IBS (irritable bowel syndrome)    Low testosterone    S/P angioplasty with stent to distal LCX and mid LCX both DES 07/25/23 07/26/2023   Sleep apnea    has used BiPAP in past; last sleep study >10 years   Past Surgical History:  Procedure Laterality Date   COLONOSCOPY  09/2018   normal, repeat in 10 years per patient.  [place], [place]   CORONARY/GRAFT ACUTE MI REVASCULARIZATION N/A 07/25/2023   Procedure: Coronary/Graft Acute MI Revascularization;  Surgeon: Tonny Bollman, MD;  Location: [facility] INVASIVE CV LAB;  Service: Cardiovascular;  Laterality: N/A;   KNEE ARTHROSCOPY Left    LEFT HEART CATH AND CORONARY ANGIOGRAPHY N/A 07/25/2023   Procedure: LEFT HEART CATH AND CORONARY ANGIOGRAPHY;  Surgeon: Tonny Bollman, MD;  Location: [facility] INVASIVE CV LAB;  Service: Cardiovascular;  Laterality: N/A;   RENAL ANGIOGRAPHY N/A 07/16/2020   Procedure: RENAL ANGIOGRAPHY;  Surgeon: Yates Decamp, MD;  Location: [facility] INVASIVE CV LAB;  Service: Cardiovascular;  Laterality: N/A;   SACROILIAC JOINT FUSION Right 05/02/2014   Procedure: SACROILIAC JOINT FUSION;  Surgeon: Emilee Hero, MD;  Location: [facility] OR;  Service: Orthopedics;  Laterality: Right;  Right sided sacroiliac joint fusion   TONSILLECTOMY     uvula  removed    Allergies  Allergies  Allergen Reactions   Pravastatin Other (See Comments)    Joint myalgias   Trazodone And Nefazodone     Had restless legs   Hydrochlorothiazide Other (See Comments)    lightheadedness   Metformin And Related Diarrhea    History of Present Illness    [name] [name] has a PMH of coronary artery disease status post STEMI 07/25/2023 with PCI and DES x 2 to his proximal and mid circumflex, hypertension, hyperlipidemia, type 2 diabetes, obesity, depression, low testosterone, headache, anxiety, and depression.  He presented to the emergency department on 07/25/2023.  He had woken up with chest pain at 3 AM.  This was accompanied by shortness of breath.  He developed nausea.  He reported 10 out of 10 chest pain.  He took four 200 mg aspirin.  At that time he felt worse and presented to the emergency department.  He was started on IV heparin.  His EKG showed ST elevation.  Code STEMI was called and he was taken to the cardiac Cath Lab.  His cardiac catheterization showed 100% occlusion of his distal circumflex and mid circumflex.  He has received PCI with DES x 2 to his circumflex.  He was also noted to have 70% LAD stenosis.  He was placed on aspirin and Brilinta.  He presents to the clinic today for follow-up evaluation and states he noticed chest discomfort back discomfort and jaw  pain on Sunday night.  He reports he continues to have upper back pain and has been having bad headaches.  His headaches have been chronic in nature.  He feels they are related to stress.  He is also frustrated that he can no longer take his medication for restless leg syndrome.  We reviewed his cardiac catheterization and stenting.  He expressed understanding.  He reports that he did not receive discharge instructions.  His EKG today shows sinus rhythm.  His blood pressure is well-controlled at 98/68.  We reviewed the importance of heart healthy diet, increasing physical activity, and  medications.  I will plan for repeat fasting lipids and LFTs in 6 to 8 weeks, give mindfulness stress reduction sheet, have him increase his physical activity as tolerated plan follow-up in 3 to 4 months.  I will also provide him with a return to work note.  He may proceed to cardiac rehab.  Today he denies shortness of breath, lower extremity edema, palpitations, melena, hematuria, hemoptysis, diaphoresis,  presyncope, syncope.    Home Medications    Prior to Admission medications   Medication Sig Start Date End Date Taking? Authorizing Provider  acetaminophen (TYLENOL) 325 MG tablet Take 2 tablets (650 mg total) by mouth every 4 (four) hours as needed for headache or mild pain. 07/26/23   Leone Brand, NP  aspirin EC 81 MG tablet Take 1 tablet (81 mg total) by mouth daily. Swallow whole. 07/27/23   Leone Brand, NP  atorvastatin (LIPITOR) 80 MG tablet Take 1 tablet (80 mg total) by mouth daily. 07/27/23   Leone Brand, NP  carvedilol (COREG) 3.125 MG tablet Take 1 tablet (3.125 mg total) by mouth 2 (two) times daily with a meal. 07/26/23   Leone Brand, NP  cetirizine (ZYRTEC) 10 MG tablet Take 1 tablet (10 mg total) by mouth daily. 07/26/23   Leone Brand, NP  insulin glargine (LANTUS) 100 unit/mL SOPN Inject 40 Units into the skin at bedtime. 07/26/23   Masters, Katie, DO  insulin lispro (HUMALOG KWIKPEN) 100 UNIT/ML KwikPen Inject 20 Units into the skin 3 (three) times daily. 07/26/23   Masters, Florentina Addison, DO  Insulin Pen Needle 32G X 4 MM MISC Use with Levemir 07/26/23     nitroGLYCERIN (NITROSTAT) 0.4 MG SL tablet Place 1 tablet (0.4 mg total) under the tongue every 5 (five) minutes as needed for chest pain. 07/26/23   Leone Brand, NP  ticagrelor (BRILINTA) 90 MG TABS tablet Take 1 tablet (90 mg total) by mouth 2 (two) times daily. 07/26/23   Leone Brand, NP    Family History    Family History  Problem Relation Age of Onset   Cancer Mother        lung   Cancer Father         prostate, mets to bone, lung   Cancer Brother        melanoma   Heart disease Maternal Grandmother        MI   Lung disease Maternal Grandfather        black lung   Heart disease Paternal Grandmother    Stroke Paternal Grandmother    Heart disease Paternal Grandfather    Heart disease Brother 45       MI   Diabetes Brother    He indicated that his mother is deceased. He indicated that his father is deceased. He indicated that only one of his three brothers is alive. He indicated  that the status of his maternal grandmother is unknown. He indicated that the status of his maternal grandfather is unknown. He indicated that the status of his paternal grandmother is unknown. He indicated that the status of his paternal grandfather is unknown.  Social History    Social History   Socioeconomic History   Marital status: Single    Spouse name: Not on file   Number of children: Not on file   Years of education: Not on file   Highest education level: Not on file  Occupational History   Not on file  Tobacco Use   Smoking status: Former    Current packs/day: 0.00    Average packs/day: 1 pack/day for 10.0 years (10.0 ttl pk-yrs)    Types: Cigarettes    Start date: 04/20/1982    Quit date: 04/20/1992    Years since quitting: 31.3   Smokeless tobacco: Never  Vaping Use   Vaping status: Never Used  Substance and Sexual Activity   Alcohol use: No   Drug use: No   Sexual activity: Not on file  Other Topics Concern   Not on file  Social History Narrative   Not on file   Social Determinants of Health   Financial Resource Strain: Not on file  Food Insecurity: Not on file  Transportation Needs: Not on file  Physical Activity: Unknown (03/20/2019)   Exercise Vital Sign    Days of Exercise per Week: 0 days    Minutes of Exercise per Session: Not on file  Stress: Not on file  Social Connections: Unknown (05/11/2022)   Received from [facility]   Social Network    Social Network: Not  on file  Intimate Partner Violence: Unknown (04/01/2022)   Received from [facility]   HITS    Physically Hurt: Not on file    Insult or Talk Down To: Not on file    Threaten Physical Harm: Not on file    Scream or Curse: Not on file     Review of Systems    General:  No chills, fever, night sweats or weight changes.  Cardiovascular:  No chest pain, dyspnea on exertion, edema, orthopnea, palpitations, paroxysmal nocturnal dyspnea. Dermatological: No rash, lesions/masses Respiratory: No cough, dyspnea Urologic: No hematuria, dysuria Abdominal:   No nausea, vomiting, diarrhea, bright red blood per rectum, melena, or hematemesis Neurologic:  No visual changes, wkns, changes in mental status. All other systems reviewed and are otherwise negative except as noted above.  Physical Exam    VS:  BP 98/68   Pulse 77   Ht 5\' 10"  (1.778 m)   Wt 192 lb 12.8 oz (87.5 kg)   SpO2 97%   BMI 27.66 kg/m  , BMI Body mass index is 27.66 kg/m. GEN: Well nourished, well developed, in no acute distress. HEENT: normal. Neck: Supple, no JVD, carotid bruits, or masses. Cardiac: RRR, no murmurs, rubs, or gallops. No clubbing, cyanosis, edema.  Radials/DP/PT 2+ and equal bilaterally.  Respiratory:  Respirations regular and unlabored, clear to auscultation bilaterally. GI: Soft, nontender, nondistended, BS + x 4. MS: no deformity or atrophy. Skin: warm and dry, no rash.  Right radial catheterization site healing well no signs of infection Neuro:  Strength and sensation are intact. Psych: Normal affect.  Accessory Clinical Findings    Recent Labs: 07/25/2023: ALT 60 07/26/2023: BUN 23; Creatinine, Ser 1.00; Hemoglobin 15.6; Magnesium 2.1; Platelets 191; Potassium 3.7; Sodium 135   Recent Lipid Panel    Component Value Date/Time  CHOL 240 (H) 07/25/2023 1316   CHOL 126 07/05/2020 0922   TRIG 111 07/25/2023 1316   HDL 37 (L) 07/25/2023 1316   HDL 29 (L) 07/05/2020 0922   CHOLHDL 6.5 07/25/2023  1316   VLDL 22 07/25/2023 1316   LDLCALC 181 (H) 07/25/2023 1316   LDLCALC 78 07/05/2020 0922   LDLDIRECT 93 08/20/2009 2019         ECG personally reviewed by me today- EKG Interpretation Date/Time:  Tuesday August 03 2023 07:54:31 EDT Ventricular Rate:  73 PR Interval:  154 QRS Duration:  80 QT Interval:  376 QTC Calculation: 414 R Axis:   67  Text Interpretation: Normal sinus rhythm septal infarct undetermined age Confirmed by Edd Fabian [id number] on 08/03/2023 7:57:15 AM   Echocardiogram 07/26/2023 IMPRESSIONS     1. Left ventricular ejection fraction, by estimation, is 50 to 55%. The  left ventricle has low normal function. The left ventricle demonstrates  regional wall motion abnormalities with basal inferolateral and basal  anterolateral akinesis. There is mild  concentric left ventricular hypertrophy. Left ventricular diastolic  parameters are consistent with Grade I diastolic dysfunction (impaired  relaxation).   2. Right ventricular systolic function is normal. The right ventricular  size is normal. Tricuspid regurgitation signal is inadequate for assessing  PA pressure.   3. The mitral valve is normal in structure. Trivial mitral valve  regurgitation. No evidence of mitral stenosis.   4. The aortic valve is tricuspid. There is mild calcification of the  aortic valve. Aortic valve regurgitation is not visualized. No aortic  stenosis is present.   5. The inferior vena cava is dilated in size with >50% respiratory  variability, suggesting right atrial pressure of 8 mmHg.   FINDINGS   Left Ventricle: Left ventricular ejection fraction, by estimation, is 50  to 55%. The left ventricle has low normal function. The left ventricle  demonstrates regional wall motion abnormalities. Definity contrast agent  was given IV to delineate the left  ventricular endocardial borders. The left ventricular internal cavity size  was normal in size. There is mild concentric left  ventricular hypertrophy.  Left ventricular diastolic parameters are consistent with Grade I  diastolic dysfunction (impaired  relaxation).   Right Ventricle: The right ventricular size is normal. No increase in  right ventricular wall thickness. Right ventricular systolic function is  normal. Tricuspid regurgitation signal is inadequate for assessing PA  pressure.   Left Atrium: Left atrial size was normal in size.   Right Atrium: Right atrial size was normal in size.   Pericardium: There is no evidence of pericardial effusion.   Mitral Valve: The mitral valve is normal in structure. Trivial mitral  valve regurgitation. No evidence of mitral valve stenosis. MV peak  gradient, 2.9 mmHg. The mean mitral valve gradient is 1.0 mmHg.   Tricuspid Valve: The tricuspid valve is normal in structure. Tricuspid  valve regurgitation is trivial.   Aortic Valve: The aortic valve is tricuspid. There is mild calcification  of the aortic valve. Aortic valve regurgitation is not visualized. No  aortic stenosis is present.   Pulmonic Valve: The pulmonic valve was normal in structure. Pulmonic valve  regurgitation is not visualized.   Aorta: The aortic root is normal in size and structure.   Venous: The inferior vena cava is dilated in size with greater than 50%  respiratory variability, suggesting right atrial pressure of 8 mmHg.   IAS/Shunts: No atrial level shunt detected by color flow Doppler.  LHC 07/25/2023    Mid Cx to Dist Cx lesion is 100% stenosed.   Prox Cx to Mid Cx lesion is 80% stenosed.   Prox LAD lesion is 70% stenosed.   A drug-eluting stent was successfully placed using a SYNERGY XD 2.50X24.   A drug-eluting stent was successfully placed using a STENT MEGATRON 3.5X16.   Post intervention, there is a 0% residual stenosis.   Post intervention, there is a 0% residual stenosis.   LV end diastolic pressure is low.   The left ventricular ejection fraction is 50-55% by  visual estimate.   Recommend uninterrupted dual antiplatelet therapy with Aspirin 81mg  daily and Ticagrelor 90mg  twice daily for a minimum of 12 months (ACS-Class I recommendation).   Acute inferolateral MI secondary to occlusion of the distal circumflex, treated successful with primary PCI using a 2.5x24 mm Synergy DES Severe mid-circumflex stenosis, treated with a 3.5x16 mm Megatron DES (80%-->0) Moderately severe ulcerative plaque in the proximal LAD of 70% Nonobstructive RCA stenosis Mild segmental LV dysfunction with LVEF estimated at 50-55%   Recommend: post-MI medical therapy. Consider staged IVUS +/- PCI of the proximal LAD  Diagnostic Dominance: Right  Intervention      Assessment & Plan   1.  STEMI-denies chest pain today.  Did note chest discomfort on Sunday,  nonexertional, atypical.  He has started to increase his physical activity and is feeling better.  Presented to the emergency department with chest discomfort on 07/25/2023.  He was taken emergently to the cardiac Cath Lab and received PCI with DES x 2 to his circumflex.  Details above.  His LAD was also noted to be 70% occluded.  He was placed on aspirin and Brilinta. Continue aspirin, Brilinta, atorvastatin, carvedilol Increase physical activity as tolerated-May start cardiac rehab Heart healthy low-sodium diet  Hyperlipidemia-LDL 181 on 07/25/2023.  LP(a) 65.6 High-fiber diet Continue aspirin, atorvastatin Refer to pharmacy lipid clinic for consideration of PCSK9 inhibitor  Resistant hypertension-BP today 98/68.  Previously on 4 antihypertensive agents. Maintain blood pressure log Continue carvedilol Low-sodium diet  Type 2 diabetes-glucose 226 on 07/26/2023. Continue insulin Follows with PCP    Disposition: Follow-up with Dr. Excell Seltzer or APP in 3-4 months.   Thomasene Ripple.  NP-C     08/03/2023, 7:57 AM [facility] [address] Office [phone] Fax [phone]    I spent 15 minutes examining this patient, reviewing medications, and using patient centered shared decision making involving her cardiac care.  Prior to her visit I spent greater than 20 minutes reviewing her past medical history,  medications, and prior cardiac tests.

## 2023-08-02 ENCOUNTER — Ambulatory Visit: Payer: Medicaid Other | Admitting: Internal Medicine

## 2023-08-02 NOTE — Progress Notes (Deleted)
  OSA GAD MDD [place]   Hx STEMI CAD HLD Patient recently hospitalized with STEMI. He had emergent cath with DES x2 placed. Discharge recommendations per cardiology include aspirin 81 mg daily and ticagrelor 90 mg BID for a minimum of 12 months.  -signs of bleeding?  Plan: Continue ASA 81 mg daily, ticagrelor 90 mg BID, atorvastatin 80 mg daily.  T2DM During recent hospitalization patient required brief insulin drip due to CBG 686 with anion gap of 19. Patient apparently had not been taking his insulin regimen--lantus 40 units daily at bedtime and humalog 20 units TID--prior to admission. He explains that he was not taking this because ***. He does not*** check his blood sugar at home.*** Plan:  HTN BP today ***, on recheck ***. He is on carvedilol 3.125 mg BID.

## 2023-08-03 ENCOUNTER — Encounter: Payer: Self-pay | Admitting: General Practice

## 2023-08-03 ENCOUNTER — Ambulatory Visit: Payer: Medicaid Other | Attending: General Practice | Admitting: General Practice

## 2023-08-03 VITALS — BP 98/68 | HR 77 | Ht 70.0 in | Wt 192.8 lb

## 2023-08-03 DIAGNOSIS — E782 Mixed hyperlipidemia: Secondary | ICD-10-CM | POA: Diagnosis not present

## 2023-08-03 DIAGNOSIS — I1A0 Resistant hypertension: Secondary | ICD-10-CM

## 2023-08-03 DIAGNOSIS — I2121 ST elevation (STEMI) myocardial infarction involving left circumflex coronary artery: Secondary | ICD-10-CM | POA: Diagnosis not present

## 2023-08-03 NOTE — Patient Instructions (Addendum)
Medication Instructions:  The current medical regimen is effective;  continue present plan and medications as directed. Please refer to the Current Medication list given to you today. *If you need a refill on your cardiac medications before your next appointment, please call your pharmacy*  Lab Work: FASTING LIPID AND LFT IN 6-8 WEEKS If you have labs (blood work) drawn today and your tests are completely normal, you will receive your results only by:  MyChart Message (if you have MyChart) OR  A paper copy in the mail If you have any lab test that is abnormal or we need to change your treatment, we will call you to review the results.  Testing/Procedures: NONE  Other Instructions OK FOR CARDIAC REHAB Mindfulness-based stress reduction-SEE BELOW PLEASE READ AND FOLLOW ATTACHED  SALTY 6  Follow-Up: At [facility], you and your health needs are our priority.  As part of our continuing mission to provide you with exceptional heart care, we have created designated Provider Care Teams.  These Care Teams include your primary Cardiologist (physician) and Advanced Practice Providers (APPs -  Physician Assistants and Nurse Practitioners) who all work together to provide you with the care you need, when you need it.  Your next appointment:   4 month(s)  Provider:   Tonny Bollman, MD  or  [facility] APP               Mindfulness-Based Stress Reduction Mindfulness-based stress reduction (MBSR) is a program that helps people learn to practice mindfulness. Mindfulness is the practice of consciously paying attention to the present moment. MBSR focuses on developing self-awareness, which lets you respond to life stress without judgment or negative feelings. It can be learned and practiced through techniques such as education, breathing exercises, meditation, and yoga. MBSR includes several mindfulness techniques in one program. MBSR works best when you understand the treatment, are willing  to try new things, and can commit to spending time practicing what you learn. MBSR training may include learning about: How your feelings, thoughts, and reactions affect your body. New ways to respond to things that cause negative thoughts to start (triggers). How to notice your thoughts and let go of them. Practicing awareness of everyday things that you normally do without thinking. The techniques and goals of different types of meditation. What are the benefits of MBSR? MBSR can have many benefits, which include helping you to: Develop self-awareness. This means knowing and understanding yourself. Learn skills and attitudes that help you to take part in your own health care. Learn new ways to care for yourself. Be more accepting about how things are, and let things go. Be less judgmental and approach things with an open mind. Be patient with yourself and trust yourself more. MBSR has also been shown to: Reduce negative emotions, such as sadness, overwhelm, and worry. Improve memory and focus. Change how you sense and react to pain. Boost your body's ability to fight infections. Help you connect better with other people. Improve your sense of well-being. How to practice mindfulness To do a basic awareness exercise: Find a comfortable place to sit. Pay attention to the present moment. Notice your thoughts, feelings, and surroundings just as they are. Avoid judging yourself, your feelings, or your surroundings. Make note of any judgment that comes up and let it go. Your mind may wander, and that is okay. Make note of when your thoughts drift, and return your attention to the present moment. To do basic mindfulness meditation: Find a  comfortable place to sit. This may include a stable chair or a firm floor cushion. Sit upright with your back straight. Let your arms fall next to your sides, with your hands resting on your legs. If you are sitting in a chair, rest your feet flat on the  floor. If you are sitting on a cushion, cross your legs in front of you. Keep your head in a neutral position with your chin dropped slightly. Relax your jaw and rest the tip of your tongue on the roof of your mouth. Drop your gaze to the floor or close your eyes. Breathe normally and pay attention to your breath. Feel the air moving in and out of your nose. Feel your belly expanding and relaxing with each breath. Your mind may wander, and that is okay. Make note of when your thoughts drift, and return your attention to your breath. Avoid judging yourself, your feelings, or your surroundings. Make note of any judgment or feelings that come up, let them go, and bring your attention back to your breath. When you are ready, lift your gaze or open your eyes. Pay attention to how your body feels after the meditation. Follow these instructions at home:  Find a local in-person or online MBSR program. Set aside some time regularly for mindfulness practice. Practice every day if you can. Even 10 minutes of practice is helpful. Find a mindfulness practice that works best for you. This may include one or more of the following: Meditation. This involves focusing your mind on a certain thought or activity. Breathing awareness exercises. These help you to stay present by focusing on your breath. Body scan. For this practice, you lie down and pay attention to each part of your body from head to toe. You can identify tension and soreness and consciously relax parts of your body. Yoga. Yoga involves stretching and breathing, and it can improve your ability to move and be flexible. It can also help you to test your body's limits, which can help you release stress. Mindful eating. This way of eating involves focusing on the taste, texture, color, and smell of each bite of food. This slows down eating and helps you feel full sooner. For this reason, it can be an important part of a weight loss plan. Find a podcast or  recording that provides guidance for breathing awareness, body scan, or meditation exercises. You can listen to these any time when you have a free moment to rest without distractions. Follow your treatment plan as told by your health care provider. This may include taking regular medicines and making changes to your diet or lifestyle as recommended. Where to find more information You can find more information about MBSR from: Your health care provider. Community-based meditation centers or programs. Programs offered near you. Summary Mindfulness-based stress reduction (MBSR) is a program that teaches you how to consciously pay attention to the present moment. It is used to help you deal better with daily stress, feelings, and pain. MBSR focuses on developing self-awareness, which allows you to respond to life stress without judgment or negative feelings. MBSR programs may involve learning different mindfulness practices, such as breathing exercises, meditation, yoga, body scan, or mindful eating. Find a mindfulness practice that works best for you, and set aside time for it on a regular basis. This information is not intended to replace advice given to you by your health care provider. Make sure you discuss any questions you have with your health care provider. Document Revised:  07/24/2021 Document Reviewed: 07/24/2021 Elsevier Patient Education  2024 [employer].

## 2023-08-04 NOTE — Progress Notes (Signed)
He was diagnosed with Diabetes with DKA

## 2023-08-06 ENCOUNTER — Other Ambulatory Visit: Payer: Self-pay

## 2023-08-06 ENCOUNTER — Telehealth: Payer: Self-pay | Admitting: Internal Medicine

## 2023-08-06 ENCOUNTER — Other Ambulatory Visit (HOSPITAL_COMMUNITY): Payer: Self-pay

## 2023-08-06 DIAGNOSIS — E1165 Type 2 diabetes mellitus with hyperglycemia: Secondary | ICD-10-CM

## 2023-08-06 MED ORDER — INSULIN LISPRO (1 UNIT DIAL) 100 UNIT/ML (KWIKPEN)
30.0000 [IU] | PEN_INJECTOR | Freq: Three times a day (TID) | SUBCUTANEOUS | 11 refills | Status: DC
  Filled 2023-08-06: qty 24, 27d supply, fill #0
  Filled 2023-08-06: qty 6, 7d supply, fill #0
  Filled 2023-08-09: qty 24, 27d supply, fill #1
  Filled 2023-09-04: qty 24, 27d supply, fill #2

## 2023-08-06 MED ORDER — INSULIN GLARGINE 100 UNITS/ML SOLOSTAR PEN
70.0000 [IU] | PEN_INJECTOR | Freq: Every day | SUBCUTANEOUS | 11 refills | Status: DC
Start: 2023-08-06 — End: 2023-09-21
  Filled 2023-08-06: qty 30, 42d supply, fill #0
  Filled 2023-09-13: qty 30, 42d supply, fill #1

## 2023-08-06 NOTE — Telephone Encounter (Signed)
Patient recently admitted with STEMI s/p PCI to distal and mid LCX. Hospital course complicated by severe hyperglycemia. He lost insurance and was to re-establish with [facility] but missed appointment 08/05. At home he has been increasing insulin on his own based on blood sugars.  His fasting blood sugars have been in 160s and before meals blood sugars have been around 300s. He has been injecting lantus 70-80 units nightly based on evening blood sugars. With meals he has been injecting 30-40 units 3 times daily.  P: Lantus 70 units nightly Lispro 30 units TID I will send in updated prescriptions for lantus and lispro. I called [facility] and got follow-up scheduled 08/22. I encouraged patient to call clinic prior to making changes to insulin doses in future.

## 2023-08-09 ENCOUNTER — Other Ambulatory Visit (HOSPITAL_COMMUNITY): Payer: Self-pay

## 2023-08-17 ENCOUNTER — Other Ambulatory Visit (HOSPITAL_COMMUNITY): Payer: Self-pay

## 2023-08-18 ENCOUNTER — Other Ambulatory Visit (HOSPITAL_COMMUNITY): Payer: Self-pay

## 2023-08-19 ENCOUNTER — Ambulatory Visit: Payer: Medicaid Other | Admitting: Student

## 2023-08-19 ENCOUNTER — Encounter: Payer: Self-pay | Admitting: Student

## 2023-08-19 ENCOUNTER — Other Ambulatory Visit (HOSPITAL_COMMUNITY): Payer: Self-pay

## 2023-08-19 VITALS — BP 152/94 | HR 98 | Ht 70.0 in | Wt 199.5 lb

## 2023-08-19 DIAGNOSIS — I2121 ST elevation (STEMI) myocardial infarction involving left circumflex coronary artery: Secondary | ICD-10-CM | POA: Diagnosis not present

## 2023-08-19 DIAGNOSIS — R002 Palpitations: Secondary | ICD-10-CM

## 2023-08-19 DIAGNOSIS — R4589 Other symptoms and signs involving emotional state: Secondary | ICD-10-CM

## 2023-08-19 DIAGNOSIS — Z87891 Personal history of nicotine dependence: Secondary | ICD-10-CM

## 2023-08-19 DIAGNOSIS — L309 Dermatitis, unspecified: Secondary | ICD-10-CM | POA: Diagnosis not present

## 2023-08-19 DIAGNOSIS — Z794 Long term (current) use of insulin: Secondary | ICD-10-CM

## 2023-08-19 DIAGNOSIS — E1110 Type 2 diabetes mellitus with ketoacidosis without coma: Secondary | ICD-10-CM

## 2023-08-19 DIAGNOSIS — I1A0 Resistant hypertension: Secondary | ICD-10-CM | POA: Diagnosis present

## 2023-08-19 MED ORDER — TRIAMCINOLONE ACETONIDE 0.1 % EX CREA
1.0000 | TOPICAL_CREAM | Freq: Two times a day (BID) | CUTANEOUS | 0 refills | Status: DC
  Filled 2023-08-19: qty 30, 15d supply, fill #0

## 2023-08-19 MED ORDER — VORTIOXETINE HBR 20 MG PO TABS
20.0000 mg | ORAL_TABLET | Freq: Every day | ORAL | 0 refills | Status: DC
Start: 2023-08-19 — End: 2023-09-21
  Filled 2023-08-19 – 2023-08-26 (×2): qty 30, 30d supply, fill #0

## 2023-08-19 MED ORDER — TICAGRELOR 90 MG PO TABS
90.0000 mg | ORAL_TABLET | Freq: Two times a day (BID) | ORAL | 11 refills | Status: DC
  Filled 2023-08-19: qty 60, 30d supply, fill #0
  Filled 2023-09-13: qty 60, 30d supply, fill #1

## 2023-08-19 MED ORDER — CARVEDILOL 6.25 MG PO TABS
6.2500 mg | ORAL_TABLET | Freq: Two times a day (BID) | ORAL | 3 refills | Status: DC
Start: 2023-08-19 — End: 2023-10-15
  Filled 2023-08-19: qty 180, 90d supply, fill #0
  Filled 2023-09-13: qty 180, 90d supply, fill #1

## 2023-08-19 MED ORDER — ATORVASTATIN CALCIUM 80 MG PO TABS
80.0000 mg | ORAL_TABLET | Freq: Every day | ORAL | 6 refills | Status: DC
  Filled 2023-08-19: qty 30, 30d supply, fill #0
  Filled 2023-09-20: qty 30, 30d supply, fill #1
  Filled 2023-10-21: qty 30, 30d supply, fill #2
  Filled 2023-11-22: qty 30, 30d supply, fill #3
  Filled 2023-12-19: qty 30, 30d supply, fill #4
  Filled 2024-01-18: qty 30, 30d supply, fill #5
  Filled 2024-02-16: qty 30, 30d supply, fill #6

## 2023-08-19 NOTE — Progress Notes (Signed)
Established Patient Office Visit  Subjective   Patient ID: [name] [name], male    DOB: Feb 24, 1961  Age: 62 y.o. MRN: [id number]  Chief Complaint  Patient presents with   Hospitalization Follow-up   Medication Refill   Patient is a 62 y.o. with a past medical history stated below who presents today to establish care and for hospitalization follow up. Please see problem based assessment and plan for additional details.    Patient with a history of CAD, HLD, T2DM, depression, and lower leg rash recently admitted 7/28 to 7/29 for STEMI s/p PCI to distal and mid LCX. He was discharged on aspirin 81 mg and ticagrelor/Brilinta 90 mg BID (will take both for 12 months) with close cardiology follow up. Has also been taking atorvastatin 80 mg daily. Patient reports that he has been having episodes since his discharge where his heart will start racing and he will feel clammy. He endorses heart palpitations during these episodes but denies CP, SOB, syncope, or altered mental status. He reports they are intermittent, can happen at rest or during activity, and can last up to one hour. Next follow up with cardiology is 11/02/2023.  While hospitalized, patient's A1C was found to be >14%. He was discharged on 70 units of Lantus and 30 units of Lispro with meals (TID). Patient reports he checks his glucose lately, and adjusts his Lantus to 70-80 units at bedtime and Lispro 30-40 units for meals. He denies any low (<70) glucose levels. Patient is not interested in CGM. He has an upcoming appointment with endocrinology on 09/21/23.   Patient has been taking his blood pressures at home and regularly sees blood pressures of 150s/90s and 160s/100s. BP today is 152/94. He reports taking Carvedilol 3.125 mg BID regularly and is tolerating well.   Patient reports he was previously on Trintellix/Vortioxetine 20 mg for his depression. Per patient, he requested to start this medication while hospitalized and was told to wait  until his PCP follow up. Has been feeling more anxious and depressed lately. Would like to start treatment today.   Patient with history of lower leg rash (bilateral). Previously prescribed triamcinolone 0.1% cream, which helped with itch and inflammation, would like to have it prescribed again.   Patient recently employed again ([employer]), has Medicaid [employer].    Past Medical History:  Diagnosis Date   Anxiety    Coronary artery disease    Depression    Diabetes mellitus    Type 2   Enlarged prostate    Environmental allergies    Environmental and seasonal allergies    GERD (gastroesophageal reflux disease)    Headache(784.0)    sinus and migraines   Hyperlipidemia    Hypertension    IBS (irritable bowel syndrome)    Low testosterone    S/P angioplasty with stent to distal LCX and mid LCX both DES 07/25/23 07/26/2023   Sleep apnea    has used BiPAP in past; last sleep study >10 years    Review of Systems  Constitutional:  Negative for chills and fever.  Respiratory:  Negative for cough and shortness of breath.   Cardiovascular:  Negative for chest pain, palpitations and leg swelling.  Gastrointestinal:  Negative for abdominal pain, nausea and vomiting.  Genitourinary:  Negative for dysuria.  Skin:        Bilateral lower leg pinpoint rash  Neurological:  Negative for dizziness, weakness and headaches.  Psychiatric/Behavioral:  Positive for depression.  Objective:     BP (!) 152/94 (BP Location: Left Arm, Patient Position: Sitting, Cuff Size: Normal)   Pulse 98   Ht 5\' 10"  (1.778 m)   Wt 199 lb 8 oz (90.5 kg)   SpO2 97%   BMI 28.63 kg/m  BP Readings from Last 3 Encounters:  08/19/23 (!) 152/94  08/03/23 98/68  07/26/23 118/63   Wt Readings from Last 3 Encounters:  08/19/23 199 lb 8 oz (90.5 kg)  08/03/23 192 lb 12.8 oz (87.5 kg)  07/25/23 163 lb 12.8 oz (74.3 kg)    Physical Exam Constitutional:      Appearance: Normal appearance.  HENT:      Head: Normocephalic and atraumatic.     Nose: Nose normal.  Eyes:     Extraocular Movements: Extraocular movements intact.     Pupils: Pupils are equal, round, and reactive to light.  Cardiovascular:     Rate and Rhythm: Normal rate and regular rhythm.     Pulses: Normal pulses.     Heart sounds: Normal heart sounds.  Pulmonary:     Effort: Pulmonary effort is normal.     Breath sounds: Normal breath sounds.  Musculoskeletal:        General: No swelling or tenderness.     Comments: Bilateral lower leg papular eczematous rash  Skin:    General: Skin is warm and dry.  Neurological:     General: No focal deficit present.     Mental Status: He is alert and oriented to person, place, and time.  Psychiatric:        Mood and Affect: Mood normal.        Behavior: Behavior normal.    No results found for any visits on 08/19/23.  Last hemoglobin A1c Lab Results  Component Value Date   HGBA1C >15.5 (H) 07/25/2023    The ASCVD Risk score (Arnett DK, et al., 2019) failed to calculate for the following reasons:   The patient has a prior MI or stroke diagnosis    Assessment & Plan:   Problem List Items Addressed This Visit       Cardiovascular and Mediastinum   Resistant hypertension (Chronic)    Patient currently taking carvedilol 3.125 mg BID for HTN, tolerating well. Reports elevated BP at home, 152/94 in clinic today. CV exam unremarkable, patient denies CP, SOB, vision changes, headache, or N/V. Patient's BP previously well controlled on carvedilol, amlodipine, and hydralazine, but all were stopped as patient's BP were at goal and he was under financial strain to afford medications. Given elevated blood pressures since his hospitalization, will increase carvedilol dose.  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 Tolerating well. Also taking atorvastatin 80 mg consistently. Patient reports intermittent episodes of heart racing, heart palpitations and feeling clammy that can last up to 1 hour. Can happen at rest or on exertion. Due to recent PCI to distal and mid LCX, would like to rule out abnormal heart rhythms. Denies CP, SOB, altered mental status or syncope. Discussed with Edd Fabian, NP (Cardiology) who recommended the  Zio monitor for 14 days. Cardiology will follow up with recordings and will see patient as needed. Patient's future cardiology appointment in November 2024.  Plan:  - Continue aspirin, Brilinta, and atorvastatin - Ordered Zio monitor, sent to patient's home, will review results with cardiology with follow up as needed - Follow up on results at [facility] October visit      Relevant Medications   atorvastatin (LIPITOR) 80 MG tablet   carvedilol (COREG) 6.25 MG tablet   Other Relevant Orders   LONG TERM MONITOR (3-14 DAYS)     Endocrine   DKA, type 2, not at goal [facility]) (Chronic)    Patient with A1C >15.5 on 07/25/23. Patient checking glucose levels regularly, not interested in CGM. Patient to be seen by endocrinology at the end of September 2024.  Plan:  - Continue 70 units Lantus at bedtime (counseled patient on using dose of 70 consistently) - Continue 30 units Lisppro with meals (patient adjusting according to pre-meal glucose levels)  - Will repeat A1C at next [facility] visit       Relevant Medications   atorvastatin (LIPITOR) 80 MG tablet     Musculoskeletal and Integument   Eczema of lower leg    Patient with chronic, bilateral lower leg rash. Looks papular and eczematous.  Plan:  - Ordered triamcinolone 0.1% cream         Other   Depressed mood (Chronic)    Patient  feeling more depressed after recent hospitalization. Requesting antidepressant today, willing to start with medication he has been on in the past that was helpful. Discussed other options, including talk therapy. Patient opted to begin with medication therapy today.  Plan:  - Start trintellix 20 mg  - Follow up on depression/mood at next [facility] visit; review other options, including referral to integrated behavioral health      Other Visit Diagnoses     Palpitations       Relevant Orders   LONG TERM MONITOR (3-14 DAYS)       Return in about 2 months (around 10/19/2023) for BP check, diabetes medication check, holster/heart racing check .   Patient seen with Dr. Lafonda Mosses.   Sherion Dooly Colbert Coyer, MD

## 2023-08-19 NOTE — Assessment & Plan Note (Signed)
>>  ASSESSMENT AND PLAN FOR STEMI INVOLVING LEFT CIRCUMFLEX CORONARY ARTERY (HCC) WRITTEN ON 08/19/2023  9:14 PM BY Colbert Coyer, PRISCILA, MD  Patient doing well on aspirin 81 mg and Brilinta 90 mg BID. Tolerating well. Also taking atorvastatin 80 mg consistently. Patient reports intermittent episodes of heart racing, heart palpitations and feeling clammy that can last up to 1 hour. Can happen at rest or on exertion. Due to recent PCI to distal and mid LCX, would like to rule out abnormal heart rhythms. Denies CP, SOB, altered mental status or syncope. Discussed with Edd Fabian, NP (Cardiology) who recommended the Zio monitor for 14 days. Cardiology will follow up with recordings and will see patient as needed. Patient's future cardiology appointment in November 2024.  Plan:  - Continue aspirin, Brilinta, and atorvastatin - Ordered Zio monitor, sent to patient's home, will review results with cardiology with follow up as needed - Follow up on results at [facility] October visit

## 2023-08-19 NOTE — Patient Instructions (Addendum)
Thank you, [name] for allowing [place] to provide your care today. Today we discussed your hospitalization follow up, type 2 diabetes medications, elevated blood pressure, depression, and lower leg rash.   I have ordered the following labs for you:  Lab Orders  No laboratory test(s) ordered today     Tests ordered today:   Referrals ordered today:   Referral Orders  No referral(s) requested today     I have ordered the following medication/changed the following medications:   Stop the following medications: Medications Discontinued During This Encounter  Medication Reason   atorvastatin (LIPITOR) 80 MG tablet Reorder   carvedilol (COREG) 3.125 MG tablet Reorder   ticagrelor (BRILINTA) 90 MG TABS tablet Reorder     Start the following medications: Meds ordered this encounter  Medications   atorvastatin (LIPITOR) 80 MG tablet    Sig: Take 1 tablet (80 mg total) by mouth daily.    Dispense:  30 tablet    Refill:  6   carvedilol (COREG) 6.25 MG tablet    Sig: Take 1 tablet (6.25 mg total) by mouth 2 (two) times daily with a meal.    Dispense:  180 tablet    Refill:  3   ticagrelor (BRILINTA) 90 MG TABS tablet    Sig: Take 1 tablet (90 mg total) by mouth 2 (two) times daily.    Dispense:  60 tablet    Refill:  11   vortioxetine HBr (TRINTELLIX) 20 MG TABS tablet    Sig: Take 1 tablet (20 mg total) by mouth daily.    Dispense:  30 tablet    Refill:  0   triamcinolone cream (KENALOG) 0.1 %    Sig: Apply 1 Application topically 2 (two) times daily.    Dispense:  30 g    Refill:  0     Follow up:  - 2 months   Remember:   - Please pick up your medications today. Please let us know if you have any issues with any of your medications.   - We have increased the dose of your Coreg to 6.25 mg twice a day to help manage your elevate blood pressures. Please continue to monitor your BP at home.   - We will discuss a preferred heart monitor for your recent heart  racing with your cardiologist. You will receive a follow up call about this.    Should you have any questions or concerns please call the internal medicine clinic at [phone].     [name] Benning Colbert Coyer, MD PGY-1 Internal Medicine Teaching [facility] Internal [facility] Center

## 2023-08-19 NOTE — Assessment & Plan Note (Signed)
Patient with A1C >15.5 on 07/25/23. Patient checking glucose levels regularly, not interested in CGM. Patient to be seen by endocrinology at the end of September 2024.  Plan:  - Continue 70 units Lantus at bedtime (counseled patient on using dose of 70 consistently) - Continue 30 units Lisppro with meals (patient adjusting according to pre-meal glucose levels)  - Will repeat A1C at next [facility] visit

## 2023-08-19 NOTE — Assessment & Plan Note (Addendum)
Patient feeling more depressed after recent hospitalization. Requesting antidepressant today, willing to start with medication he has been on in the past that was helpful. Discussed other options, including talk therapy. Patient opted to begin with medication therapy today.  Plan:  - Start trintellix 20 mg  - Follow up on depression/mood at next [facility] visit; review other options, including referral to integrated behavioral health

## 2023-08-19 NOTE — Assessment & Plan Note (Signed)
Patient doing well on aspirin 81 mg and Brilinta 90 mg BID. Tolerating well. Also taking atorvastatin 80 mg consistently. Patient reports intermittent episodes of heart racing, heart palpitations and feeling clammy that can last up to 1 hour. Can happen at rest or on exertion. Due to recent PCI to distal and mid LCX, would like to rule out abnormal heart rhythms. Denies CP, SOB, altered mental status or syncope. Discussed with Edd Fabian, NP (Cardiology) who recommended the Zio monitor for 14 days. Cardiology will follow up with recordings and will see patient as needed. Patient's future cardiology appointment in November 2024.  Plan:  - Continue aspirin, Brilinta, and atorvastatin - Ordered Zio monitor, sent to patient's home, will review results with cardiology with follow up as needed - Follow up on results at [facility] October visit

## 2023-08-19 NOTE — Assessment & Plan Note (Signed)
Patient currently taking carvedilol 3.125 mg BID for HTN, tolerating well. Reports elevated BP at home, 152/94 in clinic today. CV exam unremarkable, patient denies CP, SOB, vision changes, headache, or N/V. Patient's BP previously well controlled on carvedilol, amlodipine, and hydralazine, but all were stopped as patient's BP were at goal and he was under financial strain to afford medications. Given elevated blood pressures since his hospitalization, will increase carvedilol dose.  Plan:  - Increase carvedilol from 3.125 mg BID to 6.25 mg BID - Continue to check and record BP at home - Consider adding another antihypertensive at 2 month follow up visit

## 2023-08-19 NOTE — Assessment & Plan Note (Signed)
Patient with chronic, bilateral lower leg rash. Looks papular and eczematous.  Plan:  - Ordered triamcinolone 0.1% cream

## 2023-08-20 ENCOUNTER — Other Ambulatory Visit (HOSPITAL_COMMUNITY): Payer: Self-pay

## 2023-08-24 ENCOUNTER — Other Ambulatory Visit (HOSPITAL_COMMUNITY): Payer: Self-pay

## 2023-08-24 NOTE — Progress Notes (Signed)
Internal Medicine Clinic Attending  I was physically present during the key portions of the resident provided service and participated in the medical decision making of patient's management care. I reviewed pertinent patient test results.  The assessment, diagnosis, and plan were formulated together and I agree with the documentation in the resident's note.  Mercie Eon, MD    Complex patient here for hospital follow up today. I agree with the plan to increase carvedilol & to order a Ziopatch given his intermittent palpitations after recent STEMI. Working on adherence to insulin regimen. Starting Trintellix, which was helpful for his depression previously.

## 2023-08-25 ENCOUNTER — Telehealth: Payer: Self-pay

## 2023-08-25 NOTE — Telephone Encounter (Signed)
Decision:Approved [name] (Key: [facility]) PA Case ID #: [id number] Need Help? Call us at [phone] Outcome Approved today by [facility] Medicaid 2017 Approved. This drug has been approved. Approved quantity: 30 tablets per 30 day(s). You may fill up to a 34 day supply at a retail pharmacy. You may fill up to a 90 day supply for maintenance drugs, please refer to the formulary for details. Please call the pharmacy to process your prescription claim. Authorization Expiration Date: 08/24/2024 Drug Trintellix (formerly Brintellix) 20MG  tablets ePA cloud logo Form [employer] Medicaid of [employer] Electronic Prior Authorization Request Form [phone] [place])

## 2023-08-25 NOTE — Telephone Encounter (Signed)
Prior Authorization for patient (VortioxetineTrintellix) was submitted to cover my meds with last office notes awaiting approval or denial.  [id number]

## 2023-08-26 ENCOUNTER — Other Ambulatory Visit (HOSPITAL_COMMUNITY): Payer: Self-pay

## 2023-08-31 ENCOUNTER — Telehealth: Payer: Self-pay | Admitting: *Deleted

## 2023-08-31 ENCOUNTER — Other Ambulatory Visit: Payer: Self-pay | Admitting: Internal Medicine

## 2023-08-31 DIAGNOSIS — R002 Palpitations: Secondary | ICD-10-CM

## 2023-08-31 DIAGNOSIS — I2121 ST elevation (STEMI) myocardial infarction involving left circumflex coronary artery: Secondary | ICD-10-CM

## 2023-09-06 ENCOUNTER — Other Ambulatory Visit (HOSPITAL_COMMUNITY): Payer: Self-pay

## 2023-09-06 ENCOUNTER — Ambulatory Visit: Payer: Medicaid Other | Admitting: Internal Medicine

## 2023-09-08 ENCOUNTER — Ambulatory Visit: Payer: Medicaid Other | Attending: Internal Medicine

## 2023-09-08 ENCOUNTER — Other Ambulatory Visit: Payer: Self-pay | Admitting: Student

## 2023-09-08 DIAGNOSIS — I2121 ST elevation (STEMI) myocardial infarction involving left circumflex coronary artery: Secondary | ICD-10-CM

## 2023-09-08 DIAGNOSIS — R002 Palpitations: Secondary | ICD-10-CM

## 2023-09-08 DIAGNOSIS — R4589 Other symptoms and signs involving emotional state: Secondary | ICD-10-CM

## 2023-09-08 DIAGNOSIS — I1A0 Resistant hypertension: Secondary | ICD-10-CM

## 2023-09-08 DIAGNOSIS — L309 Dermatitis, unspecified: Secondary | ICD-10-CM

## 2023-09-08 DIAGNOSIS — E1110 Type 2 diabetes mellitus with ketoacidosis without coma: Secondary | ICD-10-CM

## 2023-09-08 NOTE — Progress Notes (Unsigned)
Enrolled for [employer] to mail a ZIO XT long term holter monitor to the patients address on file.   Dr. Cooper to read. 

## 2023-09-10 ENCOUNTER — Other Ambulatory Visit (HOSPITAL_COMMUNITY): Payer: Self-pay

## 2023-09-13 ENCOUNTER — Other Ambulatory Visit (HOSPITAL_COMMUNITY): Payer: Self-pay

## 2023-09-13 ENCOUNTER — Other Ambulatory Visit: Payer: Self-pay

## 2023-09-14 DIAGNOSIS — I2121 ST elevation (STEMI) myocardial infarction involving left circumflex coronary artery: Secondary | ICD-10-CM | POA: Diagnosis not present

## 2023-09-14 DIAGNOSIS — R002 Palpitations: Secondary | ICD-10-CM

## 2023-09-16 ENCOUNTER — Other Ambulatory Visit (HOSPITAL_COMMUNITY): Payer: Self-pay

## 2023-09-21 ENCOUNTER — Other Ambulatory Visit (HOSPITAL_COMMUNITY): Payer: Self-pay

## 2023-09-21 ENCOUNTER — Encounter: Payer: Self-pay | Admitting: Internal Medicine

## 2023-09-21 ENCOUNTER — Ambulatory Visit (INDEPENDENT_AMBULATORY_CARE_PROVIDER_SITE_OTHER): Payer: Medicaid Other | Admitting: Internal Medicine

## 2023-09-21 ENCOUNTER — Other Ambulatory Visit: Payer: Self-pay | Admitting: Student

## 2023-09-21 ENCOUNTER — Other Ambulatory Visit: Payer: Self-pay

## 2023-09-21 ENCOUNTER — Telehealth: Payer: Self-pay

## 2023-09-21 VITALS — BP 124/72 | HR 86 | Ht 70.0 in | Wt 206.0 lb

## 2023-09-21 DIAGNOSIS — E1142 Type 2 diabetes mellitus with diabetic polyneuropathy: Secondary | ICD-10-CM | POA: Insufficient documentation

## 2023-09-21 DIAGNOSIS — Z7985 Long-term (current) use of injectable non-insulin antidiabetic drugs: Secondary | ICD-10-CM | POA: Diagnosis not present

## 2023-09-21 DIAGNOSIS — E1159 Type 2 diabetes mellitus with other circulatory complications: Secondary | ICD-10-CM | POA: Diagnosis not present

## 2023-09-21 DIAGNOSIS — Z23 Encounter for immunization: Secondary | ICD-10-CM | POA: Diagnosis not present

## 2023-09-21 DIAGNOSIS — Z794 Long term (current) use of insulin: Secondary | ICD-10-CM | POA: Insufficient documentation

## 2023-09-21 DIAGNOSIS — E1165 Type 2 diabetes mellitus with hyperglycemia: Secondary | ICD-10-CM | POA: Diagnosis not present

## 2023-09-21 DIAGNOSIS — Z7984 Long term (current) use of oral hypoglycemic drugs: Secondary | ICD-10-CM | POA: Diagnosis not present

## 2023-09-21 DIAGNOSIS — E291 Testicular hypofunction: Secondary | ICD-10-CM | POA: Insufficient documentation

## 2023-09-21 DIAGNOSIS — E119 Type 2 diabetes mellitus without complications: Secondary | ICD-10-CM | POA: Insufficient documentation

## 2023-09-21 LAB — LIPID PANEL
Chol/HDL Ratio: 3.6 ratio (ref 0.0–5.0)
Cholesterol, Total: 154 mg/dL (ref 100–199)
HDL: 43 mg/dL (ref >39)
LDL Chol Calc (NIH): 82 mg/dL (ref 0–99)
Triglycerides: 167 mg/dL — ABNORMAL HIGH (ref 0–149)
VLDL Cholesterol Cal: 29 mg/dL (ref 5–40)

## 2023-09-21 LAB — POCT GLYCOSYLATED HEMOGLOBIN (HGB A1C): Hemoglobin A1C: 8.2 % — AB (ref 4.0–5.6)

## 2023-09-21 LAB — HEPATIC FUNCTION PANEL
ALT: 20 IU/L (ref 0–44)
AST: 21 IU/L (ref 0–40)
Albumin: 4.5 g/dL (ref 3.8–4.9)
Alkaline Phosphatase: 58 IU/L (ref 44–121)
Bilirubin Total: 0.4 mg/dL (ref 0.0–1.2)
Bilirubin, Direct: 0.12 mg/dL (ref 0.00–0.40)
Total Protein: 7.1 g/dL (ref 6.0–8.5)

## 2023-09-21 MED ORDER — INSULIN LISPRO (1 UNIT DIAL) 100 UNIT/ML (KWIKPEN)
20.0000 [IU] | PEN_INJECTOR | Freq: Three times a day (TID) | SUBCUTANEOUS | 11 refills | Status: DC
Start: 2023-09-21 — End: 2023-10-15

## 2023-09-21 MED ORDER — COVID-19 MRNA VAC-TRIS(PFIZER) 30 MCG/0.3ML IM SUSY
0.3000 mL | PREFILLED_SYRINGE | Freq: Once | INTRAMUSCULAR | 0 refills | Status: AC
  Filled 2023-09-21: qty 0.3, 1d supply, fill #0

## 2023-09-21 MED ORDER — LANTUS SOLOSTAR 100 UNIT/ML ~~LOC~~ SOPN: 60.0000 [IU] | PEN_INJECTOR | Freq: Every day | SUBCUTANEOUS | 3 refills | Status: DC

## 2023-09-21 MED ORDER — SEMAGLUTIDE(0.25 OR 0.5MG/DOS) 2 MG/3ML ~~LOC~~ SOPN: 0.5000 mg | PEN_INJECTOR | SUBCUTANEOUS | 3 refills | Status: DC

## 2023-09-21 MED ORDER — EMPAGLIFLOZIN 10 MG PO TABS: 10.0000 mg | ORAL_TABLET | Freq: Every day | ORAL | 3 refills | Status: DC

## 2023-09-21 MED ORDER — INSULIN PEN NEEDLE 32G X 4 MM MISC: 1.0000 | Freq: Four times a day (QID) | 3 refills | Status: DC

## 2023-09-21 NOTE — Telephone Encounter (Signed)
Medication Samples have been provided to the patient.  Drug name: Ozempic        Strength: 0.25mg         Qty: 1  LOT: [id number]  Exp.Date: 02/24/2025  Dosing instructions: INJECT ONCE WEEKLY   The patient has been instructed regarding the correct time, dose, and frequency of taking this medication, including desired effects and most common side effects.   [name] [name] L Willia Genrich 2:17 PM 09/21/2023

## 2023-09-21 NOTE — Progress Notes (Signed)
Name: [name] [name]  Age/ Sex: 62 y.o., male   MRN/ DOB: [id number], 10/14/1961     PCP: Rocky Morel, DO   Reason for Endocrinology Evaluation: Type 2 Diabetes Mellitus  Initial Endocrine Consultative Visit: 11/13/2020    PATIENT IDENTIFIER: [name] [name] is a 62 y.o. male with a past medical history of T2DM, CAD . The patient has followed with Endocrinology clinic since 11/13/2020 for consultative assistance with management of his diabetes.  DIABETIC HISTORY:  [name] [name] was diagnosed with DM in 2008, Metformin- diarrhea , glipizide - hypoglycemia. His hemoglobin A1c has ranged from 8.4% in 2021, peaking at 10.5% in 2020.  Patient was lost to follow-up for approximately 2 years from 2022 until his return to our office by 08/2023  He was started by his PCPs office on basal/prandial insulin by 02/2023 with an A1c of >14.0%  HYPOGONADISM HISTORY: Has been on testosterone for many years,   He has no prior radiation or surgeries on testicles.  He is not on opiates  He asked me to take this over 06/2021   By 10/2021 he asked to switch to topical testosterone.     SUBJECTIVE:   During the last visit (12/15/2021): A1c 6.9% Continued  Trulicity ,  Jardiance and Lantus     Today (09/21/2023): [name] [name]  is here for a follow up on diabetes management and hypogonadism. The pt has NOT been to our office in 21 months.  Patient lost his job and had no health insurance coverage Since his last visit here he presented to the hospital  02/2023 with an A1c >14 percent, and a glucose >600 mg/dL.  He was restarted on basal insulin, prandial insulin was added as well  Unfortunately he presented to the ED by 06/2023 with ST elevation MI, he had a follow-up with cardiology 07/2023  He checks glucose 3 times a day , no hypoglycemia   Denies nausea or vomiting  Denies constipation or diarrhea  Has occasional erectile dysfunction  Has rare spontaneous erections    HOME ENDOCRINE   REGIMEN:  Lantus 70 units daily  Humalog 30 units TIDQAC Jardiance 25 mg daily - not taking  Androgel 2 pumps to each shoulder - not taking     Statin: yes ACE-I/ARB: yes Prior Diabetic Education: yes   METER DOWNLOAD SUMMARY: Unable to download  78-231 mg/dL     DIABETIC COMPLICATIONS: Microvascular complications:   Denies: CKD, retinopathy, neuropathy Last Eye Exam: Completed 01/2021  Macrovascular complications:  CAD Denies:  CVA, PVD   HISTORY:  Past Medical History:  Past Medical History:  Diagnosis Date   Anxiety    Coronary artery disease    Depression    Diabetes mellitus    Type 2   Enlarged prostate    Environmental allergies    Environmental and seasonal allergies    GERD (gastroesophageal reflux disease)    Headache(784.0)    sinus and migraines   Hyperlipidemia    Hypertension    IBS (irritable bowel syndrome)    Low testosterone    S/P angioplasty with stent to distal LCX and mid LCX both DES 07/25/23 07/26/2023   Sleep apnea    has used BiPAP in past; last sleep study >10 years   Past Surgical History:  Past Surgical History:  Procedure Laterality Date   COLONOSCOPY  09/2018   normal, repeat in 10 years per patient.  [place], [place]   CORONARY/GRAFT ACUTE MI REVASCULARIZATION N/A 07/25/2023   Procedure: Coronary/Graft Acute MI Revascularization;  Surgeon: Tonny Bollman, MD;  Location: [facility] INVASIVE CV LAB;  Service: Cardiovascular;  Laterality: N/A;   KNEE ARTHROSCOPY Left    LEFT HEART CATH AND CORONARY ANGIOGRAPHY N/A 07/25/2023   Procedure: LEFT HEART CATH AND CORONARY ANGIOGRAPHY;  Surgeon: Tonny Bollman, MD;  Location: [facility] INVASIVE CV LAB;  Service: Cardiovascular;  Laterality: N/A;   RENAL ANGIOGRAPHY N/A 07/16/2020   Procedure: RENAL ANGIOGRAPHY;  Surgeon: Yates Decamp, MD;  Location: [facility] INVASIVE CV LAB;  Service: Cardiovascular;  Laterality: N/A;   SACROILIAC JOINT FUSION Right 05/02/2014   Procedure: SACROILIAC JOINT FUSION;  Surgeon: Emilee Hero, MD;  Location: [facility];  Service: Orthopedics;  Laterality: Right;  Right sided sacroiliac joint fusion   TONSILLECTOMY     uvula removed   Social History:  reports that he quit smoking about 31 years ago. His smoking use included cigarettes. He started smoking about 41 years ago. He has a 10 pack-year smoking history. He has never used smokeless tobacco. He reports that he does not drink alcohol and does not use drugs. Family History:  Family History  Problem Relation Age of Onset   Cancer Mother        lung   Cancer Father        prostate, mets to bone, lung   Cancer Brother        melanoma   Heart disease Maternal Grandmother        MI   Lung disease Maternal Grandfather        black lung   Heart disease Paternal Grandmother    Stroke Paternal Grandmother    Heart disease Paternal Grandfather    Heart disease Brother 32       MI   Diabetes Brother      HOME MEDICATIONS: Allergies as of 09/21/2023       Reactions   Pravastatin Other (See Comments)   Joint myalgias   Trazodone And Nefazodone    Had restless legs   Hydrochlorothiazide Other (See Comments)   lightheadedness   Metformin And Related Diarrhea        Medication List        Accurate as of September 21, 2023 10:15 AM. If you have any questions, ask your nurse or doctor.          STOP taking these medications    Janumet 50-1000 MG tablet Generic drug: sitaGLIPtin-metformin Stopped by: Johnney Ou Janiqua Friscia       TAKE these medications    acetaminophen 325 MG tablet Commonly known as: TYLENOL Take 2 tablets (650 mg total) by mouth every 4 (four) hours as needed for headache or mild pain.   aspirin EC 81 MG tablet Take 1 tablet (81 mg total) by mouth daily. Swallow whole.   atorvastatin 20 MG tablet Commonly known as: LIPITOR Take by mouth.   atorvastatin 80 MG tablet Commonly known as: LIPITOR Take 1 tablet (80 mg total) by mouth daily.   BD Pen Needle Nano U/F 32G X  4 MM Misc Generic drug: Insulin Pen Needle Use with Levemir   Brilinta 90 MG Tabs tablet Generic drug: ticagrelor Take 1 tablet (90 mg total) by mouth 2 (two) times daily.   buPROPion 200 MG 12 hr tablet Commonly known as: WELLBUTRIN SR Take by mouth.   carvedilol 3.125 MG tablet Commonly known as: COREG Take by mouth.   carvedilol 6.25 MG tablet Commonly known as: COREG Take 1 tablet (6.25 mg total) by mouth 2 (two) times daily with  a meal.   cetirizine 10 MG tablet Commonly known as: ZYRTEC Take 1 tablet (10 mg total) by mouth daily.   doxepin 10 MG capsule Commonly known as: SINEQUAN Take by mouth.   fluticasone 50 MCG/ACT nasal spray Commonly known as: FLONASE 1 spray Once Daily.   hydrALAZINE 25 MG tablet Commonly known as: APRESOLINE Take by mouth.   hydrochlorothiazide 25 MG tablet Commonly known as: HYDRODIURIL Take by mouth.   insulin lispro 100 UNIT/ML KwikPen Commonly known as: HumaLOG KwikPen Inject 30 Units into the skin 3 (three) times daily.   Jardiance 10 MG Tabs tablet Generic drug: empagliflozin Take by mouth.   Lantus 100 UNIT/ML injection Generic drug: insulin glargine Inject into the skin.   Lantus SoloStar 100 UNIT/ML Solostar Pen Generic drug: insulin glargine Inject 70 Units into the skin at bedtime.   lisinopril 40 MG tablet Commonly known as: ZESTRIL Take by mouth.   lisinopril-hydrochlorothiazide 20-25 MG tablet Commonly known as: ZESTORETIC Take 1 tablet by mouth daily.   nitroGLYCERIN 0.4 MG SL tablet Commonly known as: NITROSTAT Place 1 tablet (0.4 mg total) under the tongue every 5 (five) minutes as needed for chest pain.   rOPINIRole 0.5 MG tablet Commonly known as: REQUIP 1 tablet 1 to 3 hours before bedtime Orally Once a day for 90 days   simvastatin 20 MG tablet Commonly known as: ZOCOR Take by mouth.   tamsulosin 0.4 MG Caps capsule Commonly known as: FLOMAX 1 capsule Orally Once a day for 90 days    testosterone cypionate 200 MG/ML injection Commonly known as: DEPOTESTOSTERONE CYPIONATE Inject into the muscle.   triamcinolone cream 0.1 % Commonly known as: KENALOG Apply 1 Application topically 2 (two) times daily.   Trintellix 20 MG Tabs tablet Generic drug: vortioxetine HBr Take 1 tablet (20 mg total) by mouth daily.   verapamil 120 MG tablet Commonly known as: CALAN Take 1 tablet by mouth daily.         OBJECTIVE:   Vital Signs: BP 124/72 (BP Location: Left Arm, Patient Position: Sitting, Cuff Size: Large)   Pulse 86   Ht 5\' 10"  (1.778 m)   Wt 206 lb (93.4 kg)   SpO2 99%   BMI 29.56 kg/m   Wt Readings from Last 3 Encounters:  09/21/23 206 lb (93.4 kg)  08/19/23 199 lb 8 oz (90.5 kg)  08/03/23 192 lb 12.8 oz (87.5 kg)     Exam: General: Pt appears well and is in NAD  Lungs: Clear with good BS bilat   Heart: RRR   Abdomen: Soft, nontender  Extremities: No pretibial edema.  Neuro: MS is good with appropriate affect, pt is alert and Ox3    DM Foot Exam 09/21/2023 The skin of the feet is intact without sores or ulcerations. The pedal pulses are 2+ on right and 2+ on left. The sensation is decreased  to a screening 5.07, 10 gram monofilament on the right toes    DATA REVIEWED:  Lab Results  Component Value Date   HGBA1C 8.2 (A) 09/21/2023   HGBA1C >15.5 (H) 07/25/2023   HGBA1C >14.0 (A) 03/25/2023    Latest Reference Range & Units 07/26/23 12:27  Sodium 135 - 145 mmol/L 135  Potassium 3.5 - 5.1 mmol/L 3.7  Chloride 98 - 111 mmol/L 104  CO2 22 - 32 mmol/L 21 (L)  Glucose 70 - 99 mg/dL 213 (H)  BUN 6 - 20 mg/dL 23 (H)  Creatinine 0.86 - 1.24 mg/dL 5.78  Calcium 8.9 -  10.3 mg/dL 8.9  Anion gap 5 - 15  10  GFR, Estimated >60 mL/min >60    ASSESSMENT / PLAN / RECOMMENDATIONS:   1) Type 2 Diabetes Mellitus, Poorly  controlled  , With Neuropathic  and macrovascular complications - Most recent A1c of 8.2 %. Goal A1c < 7.0 %.   -His A1c has  trended down from >15.5% to 8.2% in just 2 months. -He declines CGM technology -Used to be on Jardiance and Trulicity and would like to go back on these medications, since he has not been on them in a while we will start with a small dose first as below -I will also start him on Ozempic and replacement of Trulicity, caution against GI side effects -I will decrease in insulin preemptively to prevent hypoglycemia -Patient encouraged to contact our office with BG readings <100 mg/dL, so we could gradually reduce his insulin    MEDICATIONS: -Start Jardiance 10 mg daily  -Start Ozempic 0.25 mg weekly for 6 weeks, then increase to 0.5 mg weekly  -Decrease Lantus 60 units daily  -Decrease Humalog 20 units 3 times daily before every meal   EDUCATION / INSTRUCTIONS: BG monitoring instructions: Patient is instructed to check his blood sugars 3 times a day. Call [facility] if: BG persistently < 70  I reviewed the Rule of 15 for the treatment of hypoglycemia in detail with the patient. Literature supplied.    2) Diabetic complications:  Eye: Does not have known diabetic retinopathy.  Neuro/ Feet: Does have known diabetic peripheral neuropathy based on foot exam 12/15/2021 Renal: Patient does not have known baseline CKD. He   is  on an ACEI/ARB at present.    3) Hypogonadism:   -This has been diagnosed many years ago, unknown etiology - Due to issues  with injectable testosterone administration he requested  to switch to topical testosterone in 2022 -He has not been on testosterone therapy for over a year, given recent ST elevation MI, will have to postpone this for at least 6 months   Medication N/a  F/U in 4 months    Signed electronically by: Lyndle Herrlich, MD  [facility]  [facility] [address] [place], [place] [address] Phone: [phone] FAX: [phone]   CC: Rocky Morel, DO [address]  [place] [address] Phone: [phone]  Fax: [phone]  Return to Endocrinology clinic as below: Future Appointments  Date Time Provider Department Center  10/15/2023  8:45 AM Rocky Morel, DO [facility]  11/02/2023  8:25 AM Alben Spittle Evern Bio, PA-C [id number]

## 2023-09-21 NOTE — Patient Instructions (Signed)
-   Start Jardiance 10 mg, 1 tablet every morning , after 2 weeks of taking Jardiance please start Ozempic 0.25 mg once weekly for 6 weeks, then increase to 0.5 mg weekly -Decrease Lantus to 60 units -Decrease Humalog to 20 units with each meal       HOW TO TREAT LOW BLOOD SUGARS (Blood sugar LESS THAN 70 MG/DL) Please follow the RULE OF 15 for the treatment of hypoglycemia treatment (when your (blood sugars are less than 70 mg/dL)   STEP 1: Take 15 grams of carbohydrates when your blood sugar is low, which includes:  3-4 GLUCOSE TABS  OR 3-4 OZ OF JUICE OR REGULAR SODA OR ONE TUBE OF GLUCOSE GEL    STEP 2: RECHECK blood sugar in 15 MINUTES STEP 3: If your blood sugar is still low at the 15 minute recheck --> then, go back to STEP 1 and treat AGAIN with another 15 grams of carbohydrates.

## 2023-09-22 ENCOUNTER — Other Ambulatory Visit (HOSPITAL_COMMUNITY): Payer: Self-pay

## 2023-09-22 MED ORDER — VORTIOXETINE HBR 20 MG PO TABS
20.0000 mg | ORAL_TABLET | Freq: Every day | ORAL | 0 refills | Status: DC
  Filled 2023-09-22: qty 30, 30d supply, fill #0

## 2023-09-22 MED ORDER — TRIAMCINOLONE ACETONIDE 0.1 % EX CREA
1.0000 | TOPICAL_CREAM | Freq: Two times a day (BID) | CUTANEOUS | 0 refills | Status: DC
  Filled 2023-09-22: qty 30, 15d supply, fill #0

## 2023-09-23 ENCOUNTER — Other Ambulatory Visit: Payer: Self-pay

## 2023-09-23 DIAGNOSIS — E782 Mixed hyperlipidemia: Secondary | ICD-10-CM

## 2023-09-27 ENCOUNTER — Telehealth: Payer: Self-pay

## 2023-09-27 ENCOUNTER — Other Ambulatory Visit (HOSPITAL_COMMUNITY): Payer: Self-pay

## 2023-09-27 NOTE — Telephone Encounter (Signed)
Pharmacy Patient Advocate Encounter   Received notification from Pt Calls Messages that prior authorization for Jardiance is required/requested.    Per test claim: PA required; PA submitted to [facility] via CoverMyMeds Key/confirmation #/EOC [id number] Status is pending

## 2023-09-27 NOTE — Telephone Encounter (Signed)
Patient needs a PA for Jardiance and Ozempic

## 2023-09-27 NOTE — Telephone Encounter (Signed)
Pharmacy Patient Advocate Encounter   Received notification from Pt Calls Messages that prior authorization for Ozempic is required/requested.   Insurance verification completed.   The patient is insured through [facility] .   Per test claim: PA required; PA started via CoverMyMeds. KEY  [id number] . Waiting for clinical questions to populate.

## 2023-09-28 NOTE — Telephone Encounter (Signed)
Pharmacy Patient Advocate Encounter  Received notification from [facility] that Prior Authorization for [place] has been APPROVED through 09/26/2024   PA #/Case ID/Reference #: [id number]

## 2023-09-28 NOTE — Telephone Encounter (Signed)
Pharmacy Patient Advocate Encounter  Received notification from [facility] that Prior Authorization for Ozempic has been APPROVED through 09/26/2024

## 2023-10-07 ENCOUNTER — Ambulatory Visit: Payer: Medicaid Other

## 2023-10-07 ENCOUNTER — Other Ambulatory Visit: Payer: Self-pay | Admitting: Internal Medicine

## 2023-10-07 MED ORDER — APIXABAN 5 MG PO TABS: 5.0000 mg | ORAL_TABLET | Freq: Two times a day (BID) | ORAL | 3 refills | Status: DC

## 2023-10-07 MED ORDER — CLOPIDOGREL BISULFATE 75 MG PO TABS: 75.0000 mg | ORAL_TABLET | Freq: Every day | ORAL | 3 refills | Status: DC

## 2023-10-07 NOTE — Progress Notes (Deleted)
Patient ID: [name] [name]                 DOB: Dec 13, 1961                    MRN: [id number]     HPI: [name] [name] is a 62 y.o. male patient referred to lipid clinic by ***. PMH is significant for   Current Medications:  Intolerances:  Risk Factors:  LDL goal:   Diet:   Exercise:   Family History:   Social History:   Labs:  Past Medical History:  Diagnosis Date   Anxiety    Coronary artery disease    Depression    Diabetes mellitus    Type 2   Enlarged prostate    Environmental allergies    Environmental and seasonal allergies    GERD (gastroesophageal reflux disease)    Headache(784.0)    sinus and migraines   Hyperlipidemia    Hypertension    IBS (irritable bowel syndrome)    Low testosterone    S/P angioplasty with stent to distal LCX and mid LCX both DES 07/25/23 07/26/2023   Sleep apnea    has used BiPAP in past; last sleep study >10 years    Current Outpatient Medications on File Prior to Visit  Medication Sig Dispense Refill   acetaminophen (TYLENOL) 325 MG tablet Take 2 tablets (650 mg total) by mouth every 4 (four) hours as needed for headache or mild pain.     aspirin EC 81 MG tablet Take 1 tablet (81 mg total) by mouth daily. Swallow whole. 120 tablet 2   atorvastatin (LIPITOR) 80 MG tablet Take 1 tablet (80 mg total) by mouth daily. 30 tablet 6   carvedilol (COREG) 3.125 MG tablet Take by mouth.     carvedilol (COREG) 6.25 MG tablet Take 1 tablet (6.25 mg total) by mouth 2 (two) times daily with a meal. 180 tablet 3   cetirizine (ZYRTEC) 10 MG tablet Take 1 tablet (10 mg total) by mouth daily. 100 tablet 0   doxepin (SINEQUAN) 10 MG capsule Take by mouth.     empagliflozin (JARDIANCE) 10 MG TABS tablet Take 1 tablet (10 mg total) by mouth daily before breakfast. 90 tablet 3   fluticasone (FLONASE) 50 MCG/ACT nasal spray 1 spray Once Daily.     hydrALAZINE (APRESOLINE) 25 MG tablet Take by mouth. (Patient not taking: Reported on 09/21/2023)      hydrochlorothiazide (HYDRODIURIL) 25 MG tablet Take by mouth. (Patient not taking: Reported on 09/21/2023)     insulin glargine (LANTUS SOLOSTAR) 100 UNIT/ML Solostar Pen Inject 60 Units into the skin daily. 60 mL 3   insulin lispro (HUMALOG KWIKPEN) 100 UNIT/ML KwikPen Inject 20 Units into the skin 3 (three) times daily. 30 mL 11   Insulin Pen Needle 32G X 4 MM MISC 1 Device by Does not apply route in the morning, at noon, in the evening, and at bedtime. 400 each 3   lisinopril (ZESTRIL) 40 MG tablet Take by mouth. (Patient not taking: Reported on 09/21/2023)     lisinopril-hydrochlorothiazide (ZESTORETIC) 20-25 MG tablet Take 1 tablet by mouth daily. (Patient not taking: Reported on 09/21/2023)     nitroGLYCERIN (NITROSTAT) 0.4 MG SL tablet Place 1 tablet (0.4 mg total) under the tongue every 5 (five) minutes as needed for chest pain. 25 tablet 4   rOPINIRole (REQUIP) 0.5 MG tablet 1 tablet 1 to 3 hours before bedtime Orally Once a day for 90 days (Patient  not taking: Reported on 09/21/2023)     Semaglutide,0.25 or 0.5MG /DOS, 2 MG/3ML SOPN Inject 0.5 mg into the skin once a week. 9 mL 3   tamsulosin (FLOMAX) 0.4 MG CAPS capsule 1 capsule Orally Once a day for 90 days     testosterone cypionate (DEPOTESTOSTERONE CYPIONATE) 200 MG/ML injection Inject into the muscle. (Patient not taking: Reported on 09/21/2023)     ticagrelor (BRILINTA) 90 MG TABS tablet Take 1 tablet (90 mg total) by mouth 2 (two) times daily. 60 tablet 11   triamcinolone cream (KENALOG) 0.1 % Apply 1 Application topically 2 (two) times daily. 30 g 0   verapamil (CALAN) 120 MG tablet Take 1 tablet by mouth daily. (Patient not taking: Reported on 09/21/2023)     vortioxetine HBr (TRINTELLIX) 20 MG TABS tablet Take 1 tablet (20 mg total) by mouth daily. 30 tablet 0   No current facility-administered medications on file prior to visit.    Allergies  Allergen Reactions   Pravastatin Other (See Comments)    Joint myalgias   Trazodone  And Nefazodone     Had restless legs   Hydrochlorothiazide Other (See Comments)    lightheadedness   Metformin And Related Diarrhea    Assessment/Plan:  1. Hyperlipidemia -  Reviewed options for lowering LDL cholesterol, including statins, ezetimibe, PCSK9 inhibitors, Nexletol/Nexlizet, and Leqvio. Discussed efficacy, dosing, side effects, and copay information.

## 2023-10-07 NOTE — Progress Notes (Signed)
I was able to talk to [name] [name] today.  He will stop Brilinta and Aspirin. He will start Plavix and Eliquis.  I have sent his new medicines to his pharmacy, and he'll pick them up this Saturday. He will keep taking Brilinta and aspirin until he has the new medicines in hand.   He has close follow up with our PCP office next week, and with Cardiology in the next month.

## 2023-10-15 ENCOUNTER — Ambulatory Visit: Payer: Medicaid Other | Admitting: Student

## 2023-10-15 VITALS — BP 131/81 | HR 66 | Temp 97.8°F | Ht 70.0 in | Wt 205.5 lb

## 2023-10-15 DIAGNOSIS — Z7984 Long term (current) use of oral hypoglycemic drugs: Secondary | ICD-10-CM | POA: Diagnosis not present

## 2023-10-15 DIAGNOSIS — G4733 Obstructive sleep apnea (adult) (pediatric): Secondary | ICD-10-CM

## 2023-10-15 DIAGNOSIS — Z87891 Personal history of nicotine dependence: Secondary | ICD-10-CM

## 2023-10-15 DIAGNOSIS — N3941 Urge incontinence: Secondary | ICD-10-CM | POA: Diagnosis not present

## 2023-10-15 DIAGNOSIS — R4589 Other symptoms and signs involving emotional state: Secondary | ICD-10-CM

## 2023-10-15 DIAGNOSIS — I2510 Atherosclerotic heart disease of native coronary artery without angina pectoris: Secondary | ICD-10-CM

## 2023-10-15 DIAGNOSIS — E1165 Type 2 diabetes mellitus with hyperglycemia: Secondary | ICD-10-CM

## 2023-10-15 DIAGNOSIS — I480 Paroxysmal atrial fibrillation: Secondary | ICD-10-CM | POA: Insufficient documentation

## 2023-10-15 DIAGNOSIS — Z794 Long term (current) use of insulin: Secondary | ICD-10-CM | POA: Diagnosis not present

## 2023-10-15 DIAGNOSIS — I1A0 Resistant hypertension: Secondary | ICD-10-CM | POA: Diagnosis not present

## 2023-10-15 DIAGNOSIS — G2581 Restless legs syndrome: Secondary | ICD-10-CM

## 2023-10-15 MED ORDER — INSULIN LISPRO (1 UNIT DIAL) 100 UNIT/ML (KWIKPEN)
20.0000 [IU] | PEN_INJECTOR | Freq: Three times a day (TID) | SUBCUTANEOUS | 11 refills | Status: DC
Start: 2023-10-15 — End: 2024-01-26

## 2023-10-15 MED ORDER — CARVEDILOL 12.5 MG PO TABS
12.5000 mg | ORAL_TABLET | Freq: Two times a day (BID) | ORAL | 3 refills | Status: DC
Start: 2023-10-15 — End: 2023-11-02

## 2023-10-15 MED ORDER — LANTUS SOLOSTAR 100 UNIT/ML ~~LOC~~ SOPN
55.0000 [IU] | PEN_INJECTOR | Freq: Every day | SUBCUTANEOUS | 3 refills | Status: DC
Start: 2023-10-15 — End: 2024-01-26

## 2023-10-15 NOTE — Progress Notes (Signed)
CC: Routine Follow Up   HPI:  [name] [name] is a 62 y.o. male with pertinent PMH of CAD s/p PCI to distal/mid LCx in 06/2023, HTN, T2DM, depression who presents to the clinic for follow-up visit after being seen 08/19/2023. Please see assessment and plan below for further details.  Past Medical History:  Diagnosis Date   Anxiety    Coronary artery disease    Depression    Diabetes mellitus    Type 2   Enlarged prostate    Environmental allergies    Environmental and seasonal allergies    GERD (gastroesophageal reflux disease)    Headache(784.0)    sinus and migraines   Hyperlipidemia    Hypertension    IBS (irritable bowel syndrome)    Low testosterone    S/P angioplasty with stent to distal LCX and mid LCX both DES 07/25/23 07/26/2023   Sleep apnea    has used BiPAP in past; last sleep study >10 years    Current Outpatient Medications  Medication Instructions   acetaminophen (TYLENOL) 650 mg, Oral, Every 4 hours PRN   apixaban (ELIQUIS) 5 mg, Oral, 2 times daily   atorvastatin (LIPITOR) 80 mg, Oral, Daily   carvedilol (COREG) 12.5 mg, Oral, 2 times daily with meals   cetirizine (ZYRTEC) 10 mg, Oral, Daily   clopidogrel (PLAVIX) 75 mg, Oral, Daily   empagliflozin (JARDIANCE) 10 mg, Oral, Daily before breakfast   fluticasone (FLONASE) 50 MCG/ACT nasal spray 1 spray Once Daily.   insulin lispro (HUMALOG KWIKPEN) 20 Units, Subcutaneous, 3 times daily   Insulin Pen Needle 32G X 4 MM MISC 1 Device, Does not apply, 4 times daily   Lantus SoloStar 55 Units, Subcutaneous, Daily   nitroGLYCERIN (NITROSTAT) 0.4 mg, Sublingual, Every 5 min PRN   Semaglutide(0.25 or 0.5MG /DOS) 0.5 mg, Subcutaneous, Weekly   triamcinolone cream (KENALOG) 0.1 % 1 Application, Topical, 2 times daily   Trintellix 20 mg, Oral, Daily     Review of Systems:   Pertinent items noted in HPI and/or A&P.  Physical Exam:  Vitals:   10/15/23 0834  BP: 131/81  Pulse: 66  Temp: 97.8 F (36.6 C)   TempSrc: Oral  SpO2: 97%  Weight: 205 lb 8 oz (93.2 kg)  Height: 5\' 10"  (1.778 m)    Constitutional: Anxious but well-appearing male. In no acute distress. HEENT: Normocephalic, atraumatic, Sclera non-icteric, PERRL, EOM intact Cardio:Regular rate and rhythm. 2+ bilateral radial pulses. Pulm:Clear to auscultation bilaterally. Normal work of breathing on room air. WUJ:[id number] for extremity edema. Skin:Warm and dry. Neuro:Alert and oriented x3. No focal deficit noted. Psych:Pleasant mood and affect.   Assessment & Plan:   Resistant hypertension Blood pressure today 131/81.  I went through his medications listed which include lisinopril, hydrochlorothiazide, hydralazine, and carvedilol.  He is only taking carvedilol 6.25 mg twice daily.  Discussed in atrial fibrillation he is having some tachycardic events so we will increase his carvedilol. - Increase carvedilol to 12.5 mg twice daily - Plan to add RAAS in addition in the future  CAD in native artery S/p PCI to distal/mid LCx in 06/2023.  He has previously been on aspirin, Brilinta, and atorvastatin but due to detection of paroxysmal A-fib he is now on clopidogrel and Eliquis but continues on atorvastatin.  Since her last visit he did have a 3-hour episode of tachycardia that included some jaw pain similar to part of the pain he had when he presented with a STEMI in 06/2023 and this improved with  nitroglycerin.  He has close follow-up with his cardiologist on 10/25/2023. - Continue clopidogrel 75 mg daily, Eliquis 5 mg twice daily, and atorvastatin 80 mg daily - Increase carvedilol to 12.5 mg twice daily  Severe obstructive sleep apnea Patient has a history of sleep apnea and was previously on CPAP.  However several years ago he underwent repeat testing after tonsillectomy and was found to have less apneic episodes and the needed for CPAP.  This was confirmed if years later around 2019 with home testing.  He is still having daytime  fatigue, snoring, and I believe he would benefit from repeat sleep study. - Referral for sleep study  Type 2 diabetes mellitus (HCC) Presents for a follow-up after being seen in our clinic on 08/19/2023.  His A1c is responded well to insulin therapy going from 15 to 8.2 in the span of 2 months.  He has also been seen by endocrinology on 09/21/2023.  His current recommended treatment regimen from endocrinology is Jardiance 10 mg daily, Ozempic 0.25 mg weekly for 6 weeks with plans to increase to 0.5 mg weekly, Lantus 60 units daily, Humalog 20 units 3 times daily with meals.  Both of these insulin doses were decreases from previous.  He has been taking his Jardiance and Ozempic of which she has taken 3 total doses of the 0.25 mg.  However he has been changing his Lantus dosing based on his nighttime sugar.  He has taken between 50 and 70 units with most of the time taking 50.  He is also taking 30 units of Humalog with meals.  He has had some low sugars in the 60s with symptoms after meals.  His morning glucose today was 117 after taking 70 units of Lantus yesterday.  He currently has follow-up with endocrinology in January 2025. - Decrease Lantus to 55 units daily, Humalog 20 units 3 times daily with meals, Jardiance 10 mg daily, Ozempic 0.25 mg weekly for 3 more weeks then increase to 0.5 mg weekly - Unable to give urine sample for microalbumin creatinine ratio today but will get at next visit  Paroxysmal atrial fibrillation [facility]) Patient found to have paroxysmal atrial fibrillation on cardiac monitor and was started on Eliquis.  He was on DAPT at that point with aspirin and Brilinta.  Due to this he was changed to clopidogrel and Eliquis.  Today he is not in atrial fibrillation on exam and has a normal heart rate.  However last night he had a 3-hour period of elevated heart rates in the 120s and 130s.  He is currently on carvedilol 6.25 mg twice daily. - Increase carvedilol to 12.5 mg twice daily and  continue clopidogrel 75 mg daily and Eliquis 5 mg twice daily  Depressed mood PHQ-9 score today is stable at 18 from his previous visit.  He is currently on vortioxetine 20 mg daily which is the max dose.  He has seen some improvement since starting on this medication however still thinks there is room for improvement.  We discussed this briefly but due to him being on the highest dose we would need to talk about changing medication or adding medication.  He is agreeable to talking about this at his next visit.  He does follow with a therapist. - Continue vortioxetine 20 mg daily - Discussed medication change or addition at next visit  Restless legs syndrome Patient is currently having restless leg syndrome symptoms at night after being off his ropinirole due to its interaction with antiplatelet therapy.  Since he is to continue on antiplatelet therapy we cannot add this medication back at this time.  Will check his iron since this has not been checked since 2021. - Iron panel today  Urge incontinence We were able to briefly discuss bothersome urgency urinary incontinence while you are going through his medication list and discussing Flomax.  He states this medication did not help at all for his problem.  He has seen urology several years ago but not recently.  He continues to have urgency urinary incontinence without evidence of stress incontinence.  I would recommend further workup with urology and/or trial of Myrbetriq but we were unable to discuss this during his visit today.    Patient discussed with Dr. Julieanne Cotton, DO [facility] Resident PGY-2 Clinic Phone: [phone] Pager: [phone]

## 2023-10-15 NOTE — Assessment & Plan Note (Signed)
PHQ-9 score today is stable at 18 from his previous visit.  He is currently on vortioxetine 20 mg daily which is the max dose.  He has seen some improvement since starting on this medication however still thinks there is room for improvement.  We discussed this briefly but due to him being on the highest dose we would need to talk about changing medication or adding medication.  He is agreeable to talking about this at his next visit.  He does follow with a therapist. - Continue vortioxetine 20 mg daily - Discussed medication change or addition at next visit

## 2023-10-15 NOTE — Assessment & Plan Note (Signed)
Blood pressure today 131/81.  I went through his medications listed which include lisinopril, hydrochlorothiazide, hydralazine, and carvedilol.  He is only taking carvedilol 6.25 mg twice daily.  Discussed in atrial fibrillation he is having some tachycardic events so we will increase his carvedilol. - Increase carvedilol to 12.5 mg twice daily - Plan to add RAAS in addition in the future

## 2023-10-15 NOTE — Assessment & Plan Note (Addendum)
Presents for a follow-up after being seen in our clinic on 08/19/2023.  His A1c is responded well to insulin therapy going from 15 to 8.2 in the span of 2 months.  He has also been seen by endocrinology on 09/21/2023.  His current recommended treatment regimen from endocrinology is Jardiance 10 mg daily, Ozempic 0.25 mg weekly for 6 weeks with plans to increase to 0.5 mg weekly, Lantus 60 units daily, Humalog 20 units 3 times daily with meals.  Both of these insulin doses were decreases from previous.  He has been taking his Jardiance and Ozempic of which she has taken 3 total doses of the 0.25 mg.  However he has been changing his Lantus dosing based on his nighttime sugar.  He has taken between 50 and 70 units with most of the time taking 50.  He is also taking 30 units of Humalog with meals.  He has had some low sugars in the 60s with symptoms after meals.  His morning glucose today was 117 after taking 70 units of Lantus yesterday.  He currently has follow-up with endocrinology in January 2025. - Decrease Lantus to 55 units daily, Humalog 20 units 3 times daily with meals, Jardiance 10 mg daily, Ozempic 0.25 mg weekly for 3 more weeks then increase to 0.5 mg weekly - Unable to give urine sample for microalbumin creatinine ratio today but will get at next visit

## 2023-10-15 NOTE — Assessment & Plan Note (Signed)
Patient is currently having restless leg syndrome symptoms at night after being off his ropinirole due to its interaction with antiplatelet therapy.  Since he is to continue on antiplatelet therapy we cannot add this medication back at this time.  Will check his iron since this has not been checked since 2021. - Iron panel today

## 2023-10-15 NOTE — Patient Instructions (Addendum)
  Thank you, [name], for allowing [place] to provide your care today. Today we discussed . . .  > Diabetes       -We discussed your insulin a lot today and I want you to remember that your Lantus is a very long-acting insulin and that we would like you to take the dose that we prescribe consistently.  If you have morning glucoses below 80 I would like you to call our office and we can change the Lantus dose.  Please also take Humalog 20 units before each large meal.  I think the 30 units was causing your low blood sugars.  Please continue to take your Jardiance and Ozempic.  If you need any refills on anything please do not hesitate to call us.  Please also call your eye doctor to make sure you get a diabetic eye exam this year. > Atrial fibrillation       -Since you are still having some episodes of high heart rate I would like to increase your carvedilol to 12.5 mg daily.  Please continue with your planned follow-up with cardiology.  I am also checking your iron studies today to see if this is contributing to your restless legs since we cannot give you the ropinirole while you are on the Plavix. > Depression       -You are already on the maximum dose of Trintellix so we cannot increase this medication this time.  I will set you up for a follow-up appointment to discuss further medications or medication changes.  I will also set up a follow-up with our behavioral counselor.  I hope this can happen in the next few weeks so we can start to make more progress with your mood.   I have ordered the following labs for you:  Lab Orders         Microalbumin / Creatinine Urine Ratio         Iron, TIBC and Ferritin Panel        Referrals ordered today:   Referral Orders         Ambulatory referral to Sleep Studies        Follow up: 2-3 months    Remember:     Should you have any questions or concerns please call the internal medicine clinic at [phone].     Rocky Morel, DO [facility]

## 2023-10-15 NOTE — Assessment & Plan Note (Signed)
S/p PCI to distal/mid LCx in 06/2023.  He has previously been on aspirin, Brilinta, and atorvastatin but due to detection of paroxysmal A-fib he is now on clopidogrel and Eliquis but continues on atorvastatin.  Since her last visit he did have a 3-hour episode of tachycardia that included some jaw pain similar to part of the pain he had when he presented with a STEMI in 06/2023 and this improved with nitroglycerin.  He has close follow-up with his cardiologist on 10/25/2023. - Continue clopidogrel 75 mg daily, Eliquis 5 mg twice daily, and atorvastatin 80 mg daily - Increase carvedilol to 12.5 mg twice daily

## 2023-10-15 NOTE — Assessment & Plan Note (Addendum)
Patient found to have paroxysmal atrial fibrillation on cardiac monitor and was started on Eliquis.  He was on DAPT at that point with aspirin and Brilinta.  Due to this he was changed to clopidogrel and Eliquis.  Today he is not in atrial fibrillation on exam and has a normal heart rate.  However last night he had a 3-hour period of elevated heart rates in the 120s and 130s.  He is currently on carvedilol 6.25 mg twice daily. - Increase carvedilol to 12.5 mg twice daily and continue clopidogrel 75 mg daily and Eliquis 5 mg twice daily

## 2023-10-15 NOTE — Assessment & Plan Note (Addendum)
Patient has a history of sleep apnea and was previously on CPAP.  However several years ago he underwent repeat testing after tonsillectomy and was found to have less apneic episodes and the needed for CPAP.  This was confirmed if years later around 2019 with home testing.  He is still having daytime fatigue, snoring, and I believe he would benefit from repeat sleep study. - Referral for sleep study

## 2023-10-15 NOTE — Assessment & Plan Note (Signed)
We were able to briefly discuss bothersome urgency urinary incontinence while you are going through his medication list and discussing Flomax.  He states this medication did not help at all for his problem.  He has seen urology several years ago but not recently.  He continues to have urgency urinary incontinence without evidence of stress incontinence.  I would recommend further workup with urology and/or trial of Myrbetriq but we were unable to discuss this during his visit today.

## 2023-10-17 LAB — IRON,TIBC AND FERRITIN PANEL
Ferritin: 136 ng/mL (ref 30–400)
Iron Saturation: 26 % (ref 15–55)
Iron: 78 ug/dL (ref 38–169)
Total Iron Binding Capacity: 303 ug/dL (ref 250–450)
UIBC: 225 ug/dL (ref 111–343)

## 2023-10-18 NOTE — Progress Notes (Signed)
 Internal Medicine Clinic Attending  Case discussed with the resident physician at the time of the visit.  We reviewed the patient's history, exam, and pertinent patient test results.  I agree with the assessment, diagnosis, and plan of care documented in the resident's note.

## 2023-10-19 ENCOUNTER — Telehealth: Payer: Self-pay | Admitting: Student

## 2023-10-19 DIAGNOSIS — G2581 Restless legs syndrome: Secondary | ICD-10-CM

## 2023-10-19 MED ORDER — ROPINIROLE HCL 1 MG PO TABS
1.0000 mg | ORAL_TABLET | Freq: Every day | ORAL | 3 refills | Status: DC
Start: 2023-10-19 — End: 2024-05-03

## 2023-10-19 NOTE — Progress Notes (Signed)
Called and discussed normal iron panel with the patient.  Also discussed ropinirole since he is still having bothersome restless leg symptoms.  I have sent a message to Dr. Excell Seltzer to clarify discontinuation when he was hospitalized and underwent PCI.

## 2023-10-19 NOTE — Telephone Encounter (Addendum)
After discussion with Dr. Excell Seltzer, Cardiology, we do not have a significant medication interaction to hold his ropinirole for restless leg syndrome.  Patient was called and notified that he may resume his medication but he does not currently have it so I am sending it to his pharmacy.  He will take 0.5 mg nightly for 3 days and then increase to 1 mg nightly which was his previous dose.

## 2023-10-21 ENCOUNTER — Other Ambulatory Visit: Payer: Self-pay

## 2023-10-21 ENCOUNTER — Other Ambulatory Visit: Payer: Self-pay | Admitting: Student

## 2023-10-22 ENCOUNTER — Other Ambulatory Visit (HOSPITAL_COMMUNITY): Payer: Self-pay

## 2023-10-22 MED ORDER — TRIAMCINOLONE ACETONIDE 0.1 % EX CREA
1.0000 | TOPICAL_CREAM | Freq: Two times a day (BID) | CUTANEOUS | 0 refills | Status: AC
  Filled 2023-10-22: qty 30, 15d supply, fill #0

## 2023-10-22 MED ORDER — VORTIOXETINE HBR 20 MG PO TABS
20.0000 mg | ORAL_TABLET | Freq: Every day | ORAL | 0 refills | Status: DC
  Filled 2023-10-22: qty 30, 30d supply, fill #0

## 2023-10-25 ENCOUNTER — Telehealth: Payer: Self-pay | Admitting: Pharmacy Technician

## 2023-10-25 ENCOUNTER — Other Ambulatory Visit (HOSPITAL_COMMUNITY): Payer: Self-pay

## 2023-10-25 ENCOUNTER — Ambulatory Visit: Payer: Medicaid Other | Attending: Internal Medicine | Admitting: Student

## 2023-10-25 ENCOUNTER — Encounter: Payer: Self-pay | Admitting: Student

## 2023-10-25 ENCOUNTER — Telehealth: Payer: Self-pay | Admitting: Pharmacist

## 2023-10-25 DIAGNOSIS — E782 Mixed hyperlipidemia: Secondary | ICD-10-CM

## 2023-10-25 MED ORDER — EZETIMIBE 10 MG PO TABS: 10.0000 mg | ORAL_TABLET | Freq: Every day | ORAL | 3 refills | Status: DC

## 2023-10-25 NOTE — Telephone Encounter (Signed)
Pharmacy Patient Advocate Encounter   Received notification from Pt Calls Messages that prior authorization for repatha is required/requested.   Insurance verification completed.   The patient is insured through [facility] [place] .   Per test claim: PA required; PA submitted to [facility] Medicaid via CoverMyMeds Key/confirmation #/[id number] Status is pending

## 2023-10-25 NOTE — Telephone Encounter (Signed)
PCSK9i PA requested

## 2023-10-25 NOTE — Patient Instructions (Signed)
Your Results:             Your most recent labs Goal  Total Cholesterol 154 < 200  Triglycerides 167 < 150  HDL (happy/good cholesterol) 43 > 40  LDL (lousy/bad cholesterol 82 < 55   Medication changes: We will start the process to get PCSK9i ( Repatha or Praluent) covered by your insurance.  Once the prior authorization is complete, we will call you to let you know and confirm pharmacy information.     Praluent is a cholesterol medication that improved your body's ability to get rid of "bad cholesterol" known as LDL. It can lower your LDL up to 60%. It is an injection that is given under the skin every 2 weeks. The most common side effects of Praluent include runny nose, symptoms of the common cold, rarely flu or flu-like symptoms, back/muscle pain in about 3-4% of the patients, and redness, pain, or bruising at the injection site.    Repatha is a cholesterol medication that improved your body's ability to get rid of "bad cholesterol" known as LDL. It can lower your LDL up to 60%! It is an injection that is given under the skin every 2 weeks. The most common side effects of Repatha include runny nose, symptoms of the common cold, rarely flu or flu-like symptoms, back/muscle pain in about 3-4% of the patients, and redness, pain, or bruising at the injection site.   Lab orders: We want to repeat labs after 2-3 months.  We will send you a lab order to remind you once we get closer to that time.

## 2023-10-25 NOTE — Addendum Note (Signed)
Addended by: Tylene Fantasia on: 10/25/2023 03:36 PM   Modules accepted: Orders

## 2023-10-25 NOTE — Assessment & Plan Note (Addendum)
Assessment:  LDL goal: < 55 mg/dl last LDLc  82 mg/dl (16/1096) Tolerates high intensity statins well without any side effects  Discussed next potential options (Zetia,PCSK-9 inhibitors, bempedoic acid and inclisiran); cost, dosing efficacy, side effects  Diet has improved and due to low mood does not do regular exercise   Plan: Continue taking current medications (Lipitor 80 daily) PA for Repatha has been denied and insurance wants patient to try and fail Zetia  Will add Zetia 10 mg daily to high intensity statin repeat FLP and LFT on Jan 28,2025  In future if LDL still stays above the goal will re-apply for Repatha PA

## 2023-10-25 NOTE — Progress Notes (Signed)
Patient ID: [name] [name]                 DOB: 03-26-1961                    MRN: [id number]      HPI: [name] [name] is a 62 y.o. male patient referred to lipid clinic by Edd Fabian, NP. PMH is significant for CAD status post STEMI 07/25/2023 with PCI and DES x 2 , PAF, resistant HTN, OSA, T2DM, family hx of premature CAD, obesity.    Patient presented today for lipid clinic. Reports he tolerates current statins well without side effects. We discussed LDLc goal risk factors and significance of achieving goal LDL to reduce future risk of MI and stroke. Reviewed options for lowering LDL cholesterol, including ezetimibe, PCSK-9 inhibitors, bempedoic acid and inclisiran.  Discussed mechanisms of action, dosing, side effects and potential decreases in LDL cholesterol.  Also reviewed cost information and potential options for patient assistance.  Current Medications: Lipitor 80 mg  Intolerances: pravastatin - myalgia  Risk Factors: CADstatus post STEMI 07/25/2023 with PCI and DES x 2 , HTN, OSA, T2DM, family hx of premature CAD, obesity  LDL goal: <55 mg/dl  LDL 536 on 6/44/0347. LP(a) 65.6  09/21/2023 TC 154, TG 167, HDL 43, LDLc 82   Diet: good, it is better than it was. Has stopped drinking sodas and sugary beverages only drinks water and un sweet tea, avoids deep fried food, avoids processed food  -on Ozempic max dose doing well. A1c has improved significantly 15.5 to 8.2   Exercise: none  due to  depression   Family History:  Relation Problem Comments  Mother (Deceased) Cancer lung    Father (Deceased) Cancer prostate, mets to bone, lung    Brother (Deceased) Cancer melanoma    Brother (Deceased) Heart disease (Age: 68) MI    Brother (Alive) Diabetes     Maternal Grandmother Heart disease MI    Maternal Grandfather Lung disease black lung    Paternal Grandmother Heart disease   Stroke     Paternal Grandfather  Heart disease     Social History:  Alcohol: none Smoking:  none   Labs: Lipid Panel     Component Value Date/Time   CHOL 154 09/21/2023 0936   TRIG 167 (H) 09/21/2023 0936   HDL 43 09/21/2023 0936   CHOLHDL 3.6 09/21/2023 0936   CHOLHDL 6.5 07/25/2023 1316   VLDL 22 07/25/2023 1316   LDLCALC 82 09/21/2023 0936   LDLDIRECT 93 08/20/2009 2019   LABVLDL 29 09/21/2023 0936    Past Medical History:  Diagnosis Date   Anxiety    Coronary artery disease    Depression    Diabetes mellitus    Type 2   Enlarged prostate    Environmental allergies    Environmental and seasonal allergies    GERD (gastroesophageal reflux disease)    Headache(784.0)    sinus and migraines   Hyperlipidemia    Hypertension    IBS (irritable bowel syndrome)    Low testosterone    S/P angioplasty with stent to distal LCX and mid LCX both DES 07/25/23 07/26/2023   Sleep apnea    has used BiPAP in past; last sleep study >10 years    Current Outpatient Medications on File Prior to Visit  Medication Sig Dispense Refill   acetaminophen (TYLENOL) 325 MG tablet Take 2 tablets (650 mg total) by mouth every 4 (four) hours as needed for headache or  mild pain.     apixaban (ELIQUIS) 5 MG TABS tablet Take 1 tablet (5 mg total) by mouth 2 (two) times daily. 180 tablet 3   atorvastatin (LIPITOR) 80 MG tablet Take 1 tablet (80 mg total) by mouth daily. 30 tablet 6   carvedilol (COREG) 12.5 MG tablet Take 1 tablet (12.5 mg total) by mouth 2 (two) times daily with a meal. 60 tablet 3   cetirizine (ZYRTEC) 10 MG tablet Take 1 tablet (10 mg total) by mouth daily. 100 tablet 0   clopidogrel (PLAVIX) 75 MG tablet Take 1 tablet (75 mg total) by mouth daily. 90 tablet 3   empagliflozin (JARDIANCE) 10 MG TABS tablet Take 1 tablet (10 mg total) by mouth daily before breakfast. 90 tablet 3   fluticasone (FLONASE) 50 MCG/ACT nasal spray 1 spray Once Daily.     insulin glargine (LANTUS SOLOSTAR) 100 UNIT/ML Solostar Pen Inject 55 Units into the skin daily. 70 mL 3   insulin lispro  (HUMALOG KWIKPEN) 100 UNIT/ML KwikPen Inject 20 Units into the skin 3 (three) times daily. 30 mL 11   Insulin Pen Needle 32G X 4 MM MISC 1 Device by Does not apply route in the morning, at noon, in the evening, and at bedtime. 400 each 3   nitroGLYCERIN (NITROSTAT) 0.4 MG SL tablet Place 1 tablet (0.4 mg total) under the tongue every 5 (five) minutes as needed for chest pain. 25 tablet 4   rOPINIRole (REQUIP) 1 MG tablet Take 1 tablet (1 mg total) by mouth daily. 90 tablet 3   Semaglutide,0.25 or 0.5MG /DOS, 2 MG/3ML SOPN Inject 0.5 mg into the skin once a week. 9 mL 3   triamcinolone cream (KENALOG) 0.1 % Apply 1 Application topically 2 (two) times daily. 30 g 0   vortioxetine HBr (TRINTELLIX) 20 MG TABS tablet Take 1 tablet (20 mg total) by mouth daily. 30 tablet 0   No current facility-administered medications on file prior to visit.    Allergies  Allergen Reactions   Pravastatin Other (See Comments)    Joint myalgias   Trazodone And Nefazodone     Had restless legs   Hydrochlorothiazide Other (See Comments)    lightheadedness   Metformin And Related Diarrhea    Assessment/Plan:  1. Hyperlipidemia -  Problem  Hld (Hyperlipidemia)   HLD (hyperlipidemia) Assessment:  LDL goal: < 55 mg/dl last LDLc  82 mg/dl (69/6295) Tolerates high intensity statins well without any side effects  Discussed next potential options (Zetia,PCSK-9 inhibitors, bempedoic acid and inclisiran); cost, dosing efficacy, side effects  Diet has improved and due to low mood does not do regular exercise   Plan: Continue taking current medications (Lipitor 80 daily) PA for Repatha has been denied and insurance wants patient to try and fail Zetia  Will add Zetia 10 mg daily to high intensity statin repeat FLP and LFT on Jan 28,2025  In future if LDL still stays above the goal will re-apply for Repatha PA   Thank you,  Carmela Hurt, Pharm.D [facility] of [facility] [address], [place], [place] [address]  Phone: [phone]; Fax: [phone]

## 2023-10-25 NOTE — Telephone Encounter (Addendum)
Patient made aware of denial, will add Zetia 10 mg daily to high intensity statin and repeat lab in Jan. If LDLc still remain above goal we will re-apply for Repatha PA

## 2023-10-25 NOTE — Telephone Encounter (Signed)
Pharmacy Patient Advocate Encounter  Received notification from [facility] Medicaid that Prior Authorization for repatha has been DENIED.  See denial reason below. No denial letter attached in CMM. Will attache denial letter to Media tab once received. Insurance wants rosuvastatin used as well   PA #/Case ID/Reference #: [id number]

## 2023-11-01 NOTE — Progress Notes (Addendum)
Cardiology Office Note:    Date:  11/02/2023  ID:  [name] [name], DOB Sep 14, 1961, MRN [id number] PCP: [name] Morel, DO  [facility] Providers Cardiologist:  [name] Bollman, MD       Patient Profile:      Coronary artery disease  Inf-Lat STEMI 06/2023 s/p 2.5 x 24 mm DES to dLCx, 3.5 x 16 mm DES to [facility] 07/25/23: pLCx 80, mLCx 100, pLAD 70 (ulcerative plaque), RCA mild diffuse disease, EF 50-55 TTE 07/26/23: EF 50-55, inferolateral and anterolateral AK, mild LVH, GR 1 DD, normal RVSF, trivial MR, mild AV calcification, RAP 8 Paroxysmal atrial fibrillation  Monitor 09/28/2023: NSR, no bradycardic events or high-grade heart block; A-fib with RVR <1% burden-longest episode 20 minutes Hypertension  [place] 05/23/20: > 60% L RA stenosis  Renal angio 07/16/20: no RAS bilaterally Hyperlipidemia   Diabetes mellitus  OSA         History of Present Illness:  Discussed the use of AI scribe software for clinical note transcription with the patient, who gave verbal consent to proceed.  [name] [name] is a 62 y.o. male who returns for follow up of CAD, AFib. He was last seen by [name] Fabian, NP in 07/2023 after his MI. A monitor done with primary care showed < 1% AF burden in 08/2023. His Brilinta and ASA were stopped and he was started on Eliquis and Clopidogrel.  He is here alone.  He notes episodes of palpitations, increased respiratory rate, elevated blood pressure, and profuse sweating since his myocardial infarction. These episodes were initially daily but have since decreased in frequency.  After discontinuing the monitor, he did have 1 severe episode that lasted for 3.5 hours, during which he experienced jaw pain and took nitroglycerin.  The jaw pain subsided an hour later.  The patient also reported occasional shortness of breath, which has improved since discontinuing Brilinta. The patient also reports ongoing depression, for which he is under the care of a primary care provider and a  [name] [name]. He notes a lack of motivation to engage in physical activity or exercise due to depression.  He has not had any swelling in the legs, difficulty breathing when lying flat, or any signs of bleeding.      Review of Systems  Gastrointestinal:  Negative for hematochezia and melena.  Genitourinary:  Negative for hematuria.  Psychiatric/Behavioral:  Positive for depression.   See HPI     Studies Reviewed:              Risk Assessment/Calculations:    CHA2DS2-VASc Score = 3   This indicates a 3.2% annual risk of stroke. The patient's score is based upon: CHF History: 0 HTN History: 1 Diabetes History: 1 Stroke History: 0 Vascular Disease History: 1 Age Score: 0 Gender Score: 0            Physical Exam:   VS:  BP 130/72   Pulse 78   Ht 5\' 10"  (1.778 m)   Wt 206 lb 9.6 oz (93.7 kg)   SpO2 97%   BMI 29.64 kg/m    Wt Readings from Last 3 Encounters:  11/02/23 206 lb 9.6 oz (93.7 kg)  10/15/23 205 lb 8 oz (93.2 kg)  09/21/23 206 lb (93.4 kg)    Constitutional:      Appearance: Healthy appearance. Not in distress.  Neck:     Vascular: JVD normal.  Pulmonary:     Breath sounds: Normal breath sounds. No wheezing. No rales.  Cardiovascular:  Normal rate. Regular rhythm.     Murmurs: There is no murmur.  Edema:    Peripheral edema absent.  Abdominal:     Palpations: Abdomen is soft.        Assessment and Plan:   Assessment & Plan Paroxysmal atrial fibrillation (HCC) Paroxysmal atrial fibrillation with <1% burden noted on monitor in October 2024. He wore the monitor only for 3 days. His symptoms indicate he has more frequent PAF than that. He is quite symptomatic with tachycardia, dyspnea, hypertension, and diaphoresis. Recent severe episode lasted 3.5 hours with associated jaw pain, relieved by nitroglycerin. Discussed referral to EP for consultation to see if he is candidate for PVI ablation.   - Refer to EP for AFib and consider PVI ablation - Increase  carvedilol to 18.75 mg twice daily - Continue Eliquis 5 mg twice daily - Prescribe metoprolol to tartrate 25 mg daily as needed for rapid palpitations - Monitor symptoms and report changes Coronary artery disease involving native coronary artery of native heart without angina pectoris S/p Inferolateral STEMI in July 2024, treated with DES to distal and mid LCX. Residual 70% ulcerative plaque in proximal LAD managed medically. EF 50-55% with inf-lat and ant-lat akinesis on echocardiogram. He is not very active due to severe depression. However, he has not had exertional chest pain to suggest angina. He had some random shortness of breath at times that has resolved since stopping Brilinta. He has noted some jaw pain with rapid heart rates which may be due to demand ischemia. He was changed from ASA and Brilinta to Clopidogrel and Eliquis when atrial fibrillation was dx. He did not receive a loading dose for his first dose. However, that was about a month ago. There is no reason to give a loading dose now. The importance of continuing Plavix in addition to Eliquis for one year post-MI discussed. He can likely transition to ASA + Eliquis after 1 year.  - Continue Plavix 75 mg daily, Lipitor 80 mg daily, Zetia 10 mg daily, nitroglycerin as needed - Will review further with [name] [name] to determine if further evaluation of LAD needed  ADDENDUM: Reviewed with [name] [name]. He recommends proceeding with cardiac catheterization with IVUS/PCI of the LAD vs stress testing. I contacted the pt and reviewed this. He agrees to proceed with cardiac catheterization. This will be arranged.  Informed Consent   Shared Decision Making/Informed Consent The risks [stroke (1 in 1000), death (1 in 1000), kidney failure [usually temporary] (1 in 500), bleeding (1 in 200), allergic reaction [possibly serious] (1 in 200)], benefits (diagnostic support and management of coronary artery disease) and alternatives of a cardiac  catheterization were discussed in detail with [name] [name] and he is willing to proceed.    Resistant hypertension Previous evaluation demonstrated no evidence of renal artery stenosis. Blood pressure controlled.  Increase carvedilol to 18.75 mg twice daily as noted above. Pure hypercholesterolemia Insurance has declined PCSK9 inhibitor.  He is now on Zetia 10 mg daily in addition to Lipitor 80 mg daily.  Goal LDL is <55.  Follow-up labs pending in January 2025. Major depressive disorder, remission status unspecified, unspecified whether recurrent Depressed mood limiting desire to participate in activity. Continue follow up with primary care and counseling.          Dispo:  Return in about 3 months (around 02/02/2024) for Routine Follow Up, w/ [name] [name], or Tereso Newcomer, PA-C.  Signed, Tereso Newcomer, PA-C

## 2023-11-01 NOTE — H&P (View-Only) (Signed)
Cardiology Office Note:    Date:  11/02/2023  ID:  [name] [name], DOB Sep 14, 1961, MRN [id number] PCP: Rocky Morel, DO  [facility] Providers Cardiologist:  Tonny Bollman, MD       Patient Profile:      Coronary artery disease  Inf-Lat STEMI 06/2023 s/p 2.5 x 24 mm DES to dLCx, 3.5 x 16 mm DES to [facility] 07/25/23: pLCx 80, mLCx 100, pLAD 70 (ulcerative plaque), RCA mild diffuse disease, EF 50-55 TTE 07/26/23: EF 50-55, inferolateral and anterolateral AK, mild LVH, GR 1 DD, normal RVSF, trivial MR, mild AV calcification, RAP 8 Paroxysmal atrial fibrillation  Monitor 09/28/2023: NSR, no bradycardic events or high-grade heart block; A-fib with RVR <1% burden-longest episode 20 minutes Hypertension  [place] 05/23/20: > 60% L RA stenosis  Renal angio 07/16/20: no RAS bilaterally Hyperlipidemia   Diabetes mellitus  OSA         History of Present Illness:  Discussed the use of AI scribe software for clinical note transcription with the patient, who gave verbal consent to proceed.  [name] [name] is a 62 y.o. male who returns for follow up of CAD, AFib. He was last seen by Edd Fabian, NP in 07/2023 after his MI. A monitor done with primary care showed < 1% AF burden in 08/2023. His Brilinta and ASA were stopped and he was started on Eliquis and Clopidogrel.  He is here alone.  He notes episodes of palpitations, increased respiratory rate, elevated blood pressure, and profuse sweating since his myocardial infarction. These episodes were initially daily but have since decreased in frequency.  After discontinuing the monitor, he did have 1 severe episode that lasted for 3.5 hours, during which he experienced jaw pain and took nitroglycerin.  The jaw pain subsided an hour later.  The patient also reported occasional shortness of breath, which has improved since discontinuing Brilinta. The patient also reports ongoing depression, for which he is under the care of a primary care provider and a  therapist. He notes a lack of motivation to engage in physical activity or exercise due to depression.  He has not had any swelling in the legs, difficulty breathing when lying flat, or any signs of bleeding.      Review of Systems  Gastrointestinal:  Negative for hematochezia and melena.  Genitourinary:  Negative for hematuria.  Psychiatric/Behavioral:  Positive for depression.   See HPI     Studies Reviewed:              Risk Assessment/Calculations:    CHA2DS2-VASc Score = 3   This indicates a 3.2% annual risk of stroke. The patient's score is based upon: CHF History: 0 HTN History: 1 Diabetes History: 1 Stroke History: 0 Vascular Disease History: 1 Age Score: 0 Gender Score: 0            Physical Exam:   VS:  BP 130/72   Pulse 78   Ht 5\' 10"  (1.778 m)   Wt 206 lb 9.6 oz (93.7 kg)   SpO2 97%   BMI 29.64 kg/m    Wt Readings from Last 3 Encounters:  11/02/23 206 lb 9.6 oz (93.7 kg)  10/15/23 205 lb 8 oz (93.2 kg)  09/21/23 206 lb (93.4 kg)    Constitutional:      Appearance: Healthy appearance. Not in distress.  Neck:     Vascular: JVD normal.  Pulmonary:     Breath sounds: Normal breath sounds. No wheezing. No rales.  Cardiovascular:  Normal rate. Regular rhythm.     Murmurs: There is no murmur.  Edema:    Peripheral edema absent.  Abdominal:     Palpations: Abdomen is soft.        Assessment and Plan:   Assessment & Plan Paroxysmal atrial fibrillation (HCC) Paroxysmal atrial fibrillation with <1% burden noted on monitor in October 2024. He wore the monitor only for 3 days. His symptoms indicate he has more frequent PAF than that. He is quite symptomatic with tachycardia, dyspnea, hypertension, and diaphoresis. Recent severe episode lasted 3.5 hours with associated jaw pain, relieved by nitroglycerin. Discussed referral to EP for consultation to see if he is candidate for PVI ablation.   - Refer to EP for AFib and consider PVI ablation - Increase  carvedilol to 18.75 mg twice daily - Continue Eliquis 5 mg twice daily - Prescribe metoprolol to tartrate 25 mg daily as needed for rapid palpitations - Monitor symptoms and report changes Coronary artery disease involving native coronary artery of native heart without angina pectoris S/p Inferolateral STEMI in July 2024, treated with DES to distal and mid LCX. Residual 70% ulcerative plaque in proximal LAD managed medically. EF 50-55% with inf-lat and ant-lat akinesis on echocardiogram. He is not very active due to severe depression. However, he has not had exertional chest pain to suggest angina. He had some random shortness of breath at times that has resolved since stopping Brilinta. He has noted some jaw pain with rapid heart rates which may be due to demand ischemia. He was changed from ASA and Brilinta to Clopidogrel and Eliquis when atrial fibrillation was dx. He did not receive a loading dose for his first dose. However, that was about a month ago. There is no reason to give a loading dose now. The importance of continuing Plavix in addition to Eliquis for one year post-MI discussed. He can likely transition to ASA + Eliquis after 1 year.  - Continue Plavix 75 mg daily, Lipitor 80 mg daily, Zetia 10 mg daily, nitroglycerin as needed - Will review further with Dr. Excell Seltzer to determine if further evaluation of LAD needed  ADDENDUM: Reviewed with Dr. Excell Seltzer. He recommends proceeding with cardiac catheterization with IVUS/PCI of the LAD vs stress testing. I contacted the pt and reviewed this. He agrees to proceed with cardiac catheterization. This will be arranged.  Informed Consent   Shared Decision Making/Informed Consent The risks [stroke (1 in 1000), death (1 in 1000), kidney failure [usually temporary] (1 in 500), bleeding (1 in 200), allergic reaction [possibly serious] (1 in 200)], benefits (diagnostic support and management of coronary artery disease) and alternatives of a cardiac  catheterization were discussed in detail with [name] and he is willing to proceed.    Resistant hypertension Previous evaluation demonstrated no evidence of renal artery stenosis. Blood pressure controlled.  Increase carvedilol to 18.75 mg twice daily as noted above. Pure hypercholesterolemia Insurance has declined PCSK9 inhibitor.  He is now on Zetia 10 mg daily in addition to Lipitor 80 mg daily.  Goal LDL is <55.  Follow-up labs pending in January 2025. Major depressive disorder, remission status unspecified, unspecified whether recurrent Depressed mood limiting desire to participate in activity. Continue follow up with primary care and counseling.          Dispo:  Return in about 3 months (around 02/02/2024) for Routine Follow Up, w/ Dr. Excell Seltzer, or Tereso Newcomer, PA-C.  Signed, Tereso Newcomer, PA-C

## 2023-11-02 ENCOUNTER — Ambulatory Visit: Payer: Medicaid Other | Attending: Physician Assistant | Admitting: Physician Assistant

## 2023-11-02 ENCOUNTER — Encounter: Payer: Self-pay | Admitting: Physician Assistant

## 2023-11-02 VITALS — BP 130/72 | HR 78 | Ht 70.0 in | Wt 206.6 lb

## 2023-11-02 DIAGNOSIS — I2510 Atherosclerotic heart disease of native coronary artery without angina pectoris: Secondary | ICD-10-CM

## 2023-11-02 DIAGNOSIS — E7800 Pure hypercholesterolemia, unspecified: Secondary | ICD-10-CM | POA: Diagnosis not present

## 2023-11-02 DIAGNOSIS — I480 Paroxysmal atrial fibrillation: Secondary | ICD-10-CM | POA: Diagnosis not present

## 2023-11-02 DIAGNOSIS — I1A0 Resistant hypertension: Secondary | ICD-10-CM

## 2023-11-02 DIAGNOSIS — F329 Major depressive disorder, single episode, unspecified: Secondary | ICD-10-CM

## 2023-11-02 MED ORDER — CARVEDILOL 12.5 MG PO TABS: 18.7500 mg | ORAL_TABLET | Freq: Two times a day (BID) | ORAL | 3 refills | Status: DC

## 2023-11-02 MED ORDER — METOPROLOL TARTRATE 25 MG PO TABS: 25.0000 mg | ORAL_TABLET | Freq: Every day | ORAL | 1 refills | Status: DC | PRN

## 2023-11-02 NOTE — Assessment & Plan Note (Signed)
Insurance has declined PCSK9 inhibitor.  He is now on Zetia 10 mg daily in addition to Lipitor 80 mg daily.  Goal LDL is <55.  Follow-up labs pending in January 2025.

## 2023-11-02 NOTE — Assessment & Plan Note (Addendum)
S/p Inferolateral STEMI in July 2024, treated with DES to distal and mid LCX. Residual 70% ulcerative plaque in proximal LAD managed medically. EF 50-55% with inf-lat and ant-lat akinesis on echocardiogram. He is not very active due to severe depression. However, he has not had exertional chest pain to suggest angina. He had some random shortness of breath at times that has resolved since stopping Brilinta. He has noted some jaw pain with rapid heart rates which may be due to demand ischemia. He was changed from ASA and Brilinta to Clopidogrel and Eliquis when atrial fibrillation was dx. He did not receive a loading dose for his first dose. However, that was about a month ago. There is no reason to give a loading dose now. The importance of continuing Plavix in addition to Eliquis for one year post-MI discussed. He can likely transition to ASA + Eliquis after 1 year.  - Continue Plavix 75 mg daily, Lipitor 80 mg daily, Zetia 10 mg daily, nitroglycerin as needed - Will review further with Dr. Excell Seltzer to determine if further evaluation of LAD needed  ADDENDUM: Reviewed with Dr. Excell Seltzer. He recommends proceeding with cardiac catheterization with IVUS/PCI of the LAD vs stress testing. I contacted the pt and reviewed this. He agrees to proceed with cardiac catheterization. This will be arranged.  Informed Consent   Shared Decision Making/Informed Consent The risks [stroke (1 in 1000), death (1 in 1000), kidney failure [usually temporary] (1 in 500), bleeding (1 in 200), allergic reaction [possibly serious] (1 in 200)], benefits (diagnostic support and management of coronary artery disease) and alternatives of a cardiac catheterization were discussed in detail with [name] [name] and he is willing to proceed.

## 2023-11-02 NOTE — Assessment & Plan Note (Signed)
Paroxysmal atrial fibrillation with <1% burden noted on monitor in October 2024. He wore the monitor only for 3 days. His symptoms indicate he has more frequent PAF than that. He is quite symptomatic with tachycardia, dyspnea, hypertension, and diaphoresis. Recent severe episode lasted 3.5 hours with associated jaw pain, relieved by nitroglycerin. Discussed referral to EP for consultation to see if he is candidate for PVI ablation.   - Refer to EP for AFib and consider PVI ablation - Increase carvedilol to 18.75 mg twice daily - Continue Eliquis 5 mg twice daily - Prescribe metoprolol to tartrate 25 mg daily as needed for rapid palpitations - Monitor symptoms and report changes

## 2023-11-02 NOTE — Patient Instructions (Signed)
Medication Instructions:  Your physician has recommended you make the following change in your medication:   INCREASE Carvedilol to 12.5 mg taking 1 1/2 tablet twice a day  START Metoprolol 25 mg taking 1 tablet daily only as needed for palpitations   *If you need a refill on your cardiac medications before your next appointment, please call your pharmacy*   Lab Work: None ordered  If you have labs (blood work) drawn today and your tests are completely normal, you will receive your results only by: MyChart Message (if you have MyChart) OR A paper copy in the mail If you have any lab test that is abnormal or we need to change your treatment, we will call you to review the results.   Testing/Procedures: None ordered  You have been referred to Electrophysiology Specialist to discuss Afib Ablation   Follow-Up: At [facility], you and your health needs are our priority.  As part of our continuing mission to provide you with exceptional heart care, we have created designated Provider Care Teams.  These Care Teams include your primary Cardiologist (physician) and Advanced Practice Providers (APPs -  Physician Assistants and Nurse Practitioners) who all work together to provide you with the care you need, when you need it.  We recommend signing up for the patient portal called "MyChart".  Sign up information is provided on this After Visit Summary.  MyChart is used to connect with patients for Virtual Visits (Telemedicine).  Patients are able to view lab/test results, encounter notes, upcoming appointments, etc.  Non-urgent messages can be sent to your provider as well.   To learn more about what you can do with MyChart, go to [email or web address].    Your next appointment:   3 month(s)  Provider:   Tonny Bollman, MD  or Tereso Newcomer, PA-C         Other Instructions

## 2023-11-02 NOTE — Assessment & Plan Note (Signed)
Previous evaluation demonstrated no evidence of renal artery stenosis. Blood pressure controlled.  Increase carvedilol to 18.75 mg twice daily as noted above.

## 2023-11-03 ENCOUNTER — Telehealth: Payer: Self-pay | Admitting: Cardiovascular Disease

## 2023-11-03 ENCOUNTER — Other Ambulatory Visit: Payer: Self-pay

## 2023-11-03 DIAGNOSIS — I480 Paroxysmal atrial fibrillation: Secondary | ICD-10-CM

## 2023-11-03 DIAGNOSIS — I2510 Atherosclerotic heart disease of native coronary artery without angina pectoris: Secondary | ICD-10-CM

## 2023-11-03 NOTE — Telephone Encounter (Signed)
Patient is calling stating he is returning a call from [facility]. Please call

## 2023-11-03 NOTE — Telephone Encounter (Signed)
Tried to call pt back. Left message on voicemail. Please make sure we get a good call back number and time to call. Tereso Newcomer, PA-C    11/03/2023 4:41 PM

## 2023-11-04 NOTE — Telephone Encounter (Signed)
Patient is returning phone call.  °

## 2023-11-04 NOTE — Telephone Encounter (Signed)
Spoke with patient tonight. Discussed recommendations from Dr. Excell Seltzer to proceed with cardiac catheterization with an eye towards PCI of the LAD vs stress testing. He is agreeable to proceed with cardiac catheterization.  PLAN:  -Please arrange cardiac catheterization with Dr. Excell Seltzer in the next 2 weeks  -Pt leaves for work at 11 am. Please call before 11 am.  Tereso Newcomer, PA-C    11/04/2023 6:49 PM

## 2023-11-04 NOTE — Telephone Encounter (Signed)
Spoke with patient, he states he will be busy getting ready for work and will be there from 11am to 5pm today. He will not be available to talk until after 5:15pm today.  Best callback number is [phone].

## 2023-11-05 ENCOUNTER — Other Ambulatory Visit: Payer: Self-pay | Admitting: *Deleted

## 2023-11-05 ENCOUNTER — Encounter: Payer: Self-pay | Admitting: *Deleted

## 2023-11-05 DIAGNOSIS — I2510 Atherosclerotic heart disease of native coronary artery without angina pectoris: Secondary | ICD-10-CM

## 2023-11-05 NOTE — Addendum Note (Signed)
Addended byAlben Spittle, Lorin Picket T on: 11/05/2023 12:59 PM   Modules accepted: Orders

## 2023-11-05 NOTE — Telephone Encounter (Signed)
Pt aware that my assignment has changed and I will be at a different office.  Harlow Ohms, RN, has agreed to do the EKG for me.  Pt aware to ask for her when he gets here.

## 2023-11-05 NOTE — Telephone Encounter (Signed)
Spoke with pt.  He is agreeable to scheduling cath with Dr. Excell Seltzer, 11/10/23.  Pt will come to office, Monday, 11/08/23 for EKG and BMET & CBC.  Pt aware to ask for me.  Pt will pick up his instructions on Monday, but has already been aware to start holding his Eliquis after the Sunday night (11/07/23) dose.            Cardiac/Peripheral Catheterization   You are scheduled for a Cardiac Catheterization on Wednesday, November 13 with Dr. Tonny Bollman.  1. Please arrive at the [facility] (Main Entrance A) at [facility]: [address] [place], [place] [address] at 12:30 PM (This time is 2 hour(s) before your procedure to ensure your preparation). Free valet parking service is available. You will check in at ADMITTING. The support person will be asked to wait in the waiting room.  It is OK to have someone drop you off and come back when you are ready to be discharged.        Special note: Every effort is made to have your procedure done on time. Please understand that emergencies sometimes delay scheduled procedures.  2. Diet: Do not eat solid foods after midnight.  You may have clear liquids until 5 AM the day of the procedure.  3. Labs: You will need to have blood drawn on  [facility] 11/08/23,   4. Medication instructions in preparation for your procedure:   Contrast Allergy: No  Stop taking Eliquis (Apixiban) on Sunday, November 10. WILL BE YOUR LAST DOSE   Take only 27.5 units of insulin the night before your procedure. Do not take any insulin on the day of the procedure.  On the morning of your procedure, take Aspirin 81 mg and Plavix/Clopidogrel and any morning medicines NOT listed above.  You may use sips of water.  5. Plan to go home the same day, you will only stay overnight if medically necessary. 6. You MUST have a responsible adult to drive you home. 7. An adult MUST be with you the first 24 hours after you arrive home. 8. Bring a current list of your  medications, and the last time and date medication taken. 9. Bring ID and current insurance cards. 10.Please wear clothes that are easy to get on and off and wear slip-on shoes.  Thank you for allowing [place] to care for you!   -- [place] [facility]

## 2023-11-08 ENCOUNTER — Other Ambulatory Visit (INDEPENDENT_AMBULATORY_CARE_PROVIDER_SITE_OTHER): Payer: Medicaid Other

## 2023-11-08 ENCOUNTER — Ambulatory Visit: Payer: Medicaid Other | Attending: Cardiology

## 2023-11-08 VITALS — HR 76 | Ht 70.0 in | Wt 209.0 lb

## 2023-11-08 DIAGNOSIS — I2510 Atherosclerotic heart disease of native coronary artery without angina pectoris: Secondary | ICD-10-CM | POA: Diagnosis not present

## 2023-11-08 NOTE — Progress Notes (Signed)
   Nurse Visit   Date of Encounter: 11/08/2023 ID: [name] [name], DOB Mar 28, 1961, MRN [id number]  PCP:  Rocky Morel, DO   [facility] Providers Cardiologist:  Tonny Bollman, MD      Visit Details   VS:  Pulse 76   Ht 5\' 10"  (1.778 m)   Wt 209 lb (94.8 kg)   BMI 29.99 kg/m  , BMI Body mass index is 29.99 kg/m.  Wt Readings from Last 3 Encounters:  11/08/23 209 lb (94.8 kg)  11/02/23 206 lb 9.6 oz (93.7 kg)  10/15/23 205 lb 8 oz (93.2 kg)     Reason for visit: EKG Performed today: Vitals, EKG, and Education Changes (medications, testing, etc.) : None Length of Visit: 10 minutes    Medications Adjustments/Labs and Tests Ordered: No orders of the defined types were placed in this encounter.  No orders of the defined types were placed in this encounter.    Gevena Mart, RN  11/08/2023 9:02 AM   EKG reviewed. Tereso Newcomer, PA-C    11/08/2023 10:23 AM

## 2023-11-08 NOTE — Patient Instructions (Signed)
Medication Instructions:  Your physician recommends that you continue on your current medications as directed. Please refer to the Current Medication list given to you today.  *If you need a refill on your cardiac medications before your next appointment, please call your pharmacy*  Lab Work: CBC completed today/.  If you have labs (blood work) drawn today and your tests are completely normal, you will receive your results only by: MyChart Message (if you have MyChart) OR A paper copy in the mail If you have any lab test that is abnormal or we need to change your treatment, we will call you to review the results.  Testing/Procedures: EKG completed today.  Follow-Up: At [facility], you and your health needs are our priority.  As part of our continuing mission to provide you with exceptional heart care, we have created designated Provider Care Teams.  These Care Teams include your primary Cardiologist (physician) and Advanced Practice Providers (APPs -  Physician Assistants and Nurse Practitioners) who all work together to provide you with the care you need, when you need it.

## 2023-11-09 ENCOUNTER — Telehealth: Payer: Self-pay | Admitting: *Deleted

## 2023-11-09 LAB — BASIC METABOLIC PANEL
BUN/Creatinine Ratio: 22 (ref 10–24)
BUN: 22 mg/dL (ref 8–27)
CO2: 25 mmol/L (ref 20–29)
Calcium: 9.7 mg/dL (ref 8.6–10.2)
Chloride: 104 mmol/L (ref 96–106)
Creatinine, Ser: 0.99 mg/dL (ref 0.76–1.27)
Glucose: 153 mg/dL — ABNORMAL HIGH (ref 70–99)
Potassium: 4.6 mmol/L (ref 3.5–5.2)
Sodium: 141 mmol/L (ref 134–144)
eGFR: 87 mL/min/{1.73_m2} (ref >59)

## 2023-11-09 LAB — CBC
Hematocrit: 50.6 % (ref 37.5–51.0)
Hemoglobin: 16.6 g/dL (ref 13.0–17.7)
MCH: 31.4 pg (ref 26.6–33.0)
MCHC: 32.8 g/dL (ref 31.5–35.7)
MCV: 96 fL (ref 79–97)
Platelets: 214 10*3/uL (ref 150–450)
RBC: 5.28 x10E6/uL (ref 4.14–5.80)
RDW: 12.8 % (ref 11.6–15.4)
WBC: 7.1 10*3/uL (ref 3.4–10.8)

## 2023-11-09 NOTE — Telephone Encounter (Signed)
Coronary Stent Intervention scheduled at [facility] for: Wednesday November 10, 2023 2:30 PM Arrival time [facility] Main Entrance A at: 12:30 PM  Nothing to eat after midnight prior to procedure, clear liquids until 5 AM day of procedure  Medication instructions: -Hold:  Eliquis-none 11/08/23 until post procedure  Insulin-AM of procedure/1/2 usual dose HS prior to procedure  Jardiance -AM of procedure -Other usual morning medications can be taken with sips of water including aspirin 81 mg and Plavix 75 mg.  Patient reports weekly semaglutide on Saturdays-last dose 11/06/23  Plan to go home the same day, you will only stay overnight if medically necessary.  You must have responsible adult to drive you home.  Someone must be with you the first 24 hours after you arrive home.  Reviewed procedure instructions with patient.

## 2023-11-10 ENCOUNTER — Ambulatory Visit (HOSPITAL_COMMUNITY)
Admission: RE | Admit: 2023-11-10 | Discharge: 2023-11-11 | Disposition: A | Discharge status: Home / Self Care | Payer: Medicaid Other | Attending: Cardiovascular Disease | Admitting: Cardiovascular Disease

## 2023-11-10 ENCOUNTER — Encounter (HOSPITAL_COMMUNITY)
Admission: RE | Disposition: A | Discharge status: Home / Self Care | Payer: Self-pay | Source: Home / Self Care | Attending: Cardiovascular Disease

## 2023-11-10 ENCOUNTER — Other Ambulatory Visit: Payer: Self-pay

## 2023-11-10 ENCOUNTER — Encounter (HOSPITAL_COMMUNITY): Payer: Self-pay | Admitting: Cardiovascular Disease

## 2023-11-10 DIAGNOSIS — E119 Type 2 diabetes mellitus without complications: Secondary | ICD-10-CM | POA: Diagnosis not present

## 2023-11-10 DIAGNOSIS — I2510 Atherosclerotic heart disease of native coronary artery without angina pectoris: Secondary | ICD-10-CM

## 2023-11-10 DIAGNOSIS — Z79899 Other long term (current) drug therapy: Secondary | ICD-10-CM | POA: Diagnosis not present

## 2023-11-10 DIAGNOSIS — I25118 Atherosclerotic heart disease of native coronary artery with other forms of angina pectoris: Secondary | ICD-10-CM | POA: Diagnosis present

## 2023-11-10 DIAGNOSIS — I480 Paroxysmal atrial fibrillation: Secondary | ICD-10-CM | POA: Diagnosis not present

## 2023-11-10 DIAGNOSIS — I25119 Atherosclerotic heart disease of native coronary artery with unspecified angina pectoris: Secondary | ICD-10-CM

## 2023-11-10 DIAGNOSIS — Z7901 Long term (current) use of anticoagulants: Secondary | ICD-10-CM | POA: Diagnosis not present

## 2023-11-10 DIAGNOSIS — Z7902 Long term (current) use of antithrombotics/antiplatelets: Secondary | ICD-10-CM | POA: Insufficient documentation

## 2023-11-10 DIAGNOSIS — I252 Old myocardial infarction: Secondary | ICD-10-CM | POA: Diagnosis not present

## 2023-11-10 DIAGNOSIS — E7800 Pure hypercholesterolemia, unspecified: Secondary | ICD-10-CM | POA: Insufficient documentation

## 2023-11-10 DIAGNOSIS — I10 Essential (primary) hypertension: Secondary | ICD-10-CM | POA: Insufficient documentation

## 2023-11-10 DIAGNOSIS — G4733 Obstructive sleep apnea (adult) (pediatric): Secondary | ICD-10-CM | POA: Insufficient documentation

## 2023-11-10 DIAGNOSIS — Z955 Presence of coronary angioplasty implant and graft: Secondary | ICD-10-CM | POA: Insufficient documentation

## 2023-11-10 DIAGNOSIS — E785 Hyperlipidemia, unspecified: Secondary | ICD-10-CM | POA: Diagnosis present

## 2023-11-10 DIAGNOSIS — F32A Depression, unspecified: Secondary | ICD-10-CM | POA: Diagnosis not present

## 2023-11-10 DIAGNOSIS — E1159 Type 2 diabetes mellitus with other circulatory complications: Secondary | ICD-10-CM | POA: Diagnosis present

## 2023-11-10 DIAGNOSIS — I1A0 Resistant hypertension: Secondary | ICD-10-CM | POA: Diagnosis present

## 2023-11-10 DIAGNOSIS — E1169 Type 2 diabetes mellitus with other specified complication: Secondary | ICD-10-CM | POA: Diagnosis present

## 2023-11-10 DIAGNOSIS — I152 Hypertension secondary to endocrine disorders: Secondary | ICD-10-CM | POA: Diagnosis present

## 2023-11-10 HISTORY — PX: CORONARY ULTRASOUND/IVUS: CATH118244

## 2023-11-10 HISTORY — PX: CORONARY STENT INTERVENTION: CATH118234

## 2023-11-10 LAB — GLUCOSE, CAPILLARY
Glucose-Capillary: 154 mg/dL — ABNORMAL HIGH (ref 70–99)
Glucose-Capillary: 170 mg/dL — ABNORMAL HIGH (ref 70–99)

## 2023-11-10 LAB — POCT ACTIVATED CLOTTING TIME: Activated Clotting Time: 337 s

## 2023-11-10 SURGERY — CORONARY STENT INTERVENTION
Anesthesia: LOCAL

## 2023-11-10 MED ORDER — LABETALOL HCL 5 MG/ML IV SOLN: 10.0000 mg | INTRAVENOUS | Status: AC | PRN

## 2023-11-10 MED ORDER — IOHEXOL 350 MG/ML SOLN
INTRAVENOUS | Status: DC | PRN
  Administered 2023-11-10: 70 mL

## 2023-11-10 MED ORDER — EMPAGLIFLOZIN 10 MG PO TABS
10.0000 mg | ORAL_TABLET | Freq: Every day | ORAL | Status: DC
  Administered 2023-11-11: 10 mg via ORAL
  Filled 2023-11-10: qty 1

## 2023-11-10 MED ORDER — VORTIOXETINE HBR 20 MG PO TABS
20.0000 mg | ORAL_TABLET | Freq: Every day | ORAL | Status: DC
  Administered 2023-11-11: 20 mg via ORAL
  Filled 2023-11-10: qty 1

## 2023-11-10 MED ORDER — HYDRALAZINE HCL 20 MG/ML IJ SOLN
10.0000 mg | INTRAMUSCULAR | Status: AC | PRN
Start: 2023-11-10 — End: 2023-11-10

## 2023-11-10 MED ORDER — INSULIN GLARGINE-YFGN 100 UNIT/ML ~~LOC~~ SOLN
55.0000 [IU] | Freq: Every day | SUBCUTANEOUS | Status: DC
  Administered 2023-11-11: 55 [IU] via SUBCUTANEOUS
  Filled 2023-11-10 (×3): qty 0.55

## 2023-11-10 MED ORDER — CARVEDILOL 6.25 MG PO TABS
18.7500 mg | ORAL_TABLET | Freq: Two times a day (BID) | ORAL | Status: DC
  Administered 2023-11-10 – 2023-11-11 (×2): 18.75 mg via ORAL
  Filled 2023-11-10 (×2): qty 1

## 2023-11-10 MED ORDER — HEPARIN (PORCINE) IN NACL 1000-0.9 UT/500ML-% IV SOLN
INTRAVENOUS | Status: DC | PRN
  Administered 2023-11-10 (×2): 500 mL

## 2023-11-10 MED ORDER — VERAPAMIL HCL 2.5 MG/ML IV SOLN
INTRAVENOUS | Status: DC | PRN
  Administered 2023-11-10: 10 mL via INTRA_ARTERIAL

## 2023-11-10 MED ORDER — ONDANSETRON HCL 4 MG/2ML IJ SOLN: 4.0000 mg | Freq: Four times a day (QID) | INTRAMUSCULAR | Status: DC | PRN

## 2023-11-10 MED ORDER — SODIUM CHLORIDE 0.9% FLUSH: 3.0000 mL | INTRAVENOUS | Status: DC | PRN

## 2023-11-10 MED ORDER — VERAPAMIL HCL 2.5 MG/ML IV SOLN
INTRAVENOUS | Status: AC
  Filled 2023-11-10: qty 2

## 2023-11-10 MED ORDER — MIDAZOLAM HCL 2 MG/2ML IJ SOLN
INTRAMUSCULAR | Status: AC
  Filled 2023-11-10: qty 2

## 2023-11-10 MED ORDER — INSULIN ASPART 100 UNIT/ML IJ SOLN
20.0000 [IU] | Freq: Three times a day (TID) | INTRAMUSCULAR | Status: DC
  Administered 2023-11-11: 20 [IU] via SUBCUTANEOUS

## 2023-11-10 MED ORDER — ACETAMINOPHEN 325 MG PO TABS
650.0000 mg | ORAL_TABLET | Freq: Once | ORAL | Status: AC
  Administered 2023-11-10: 650 mg via ORAL

## 2023-11-10 MED ORDER — HEPARIN SODIUM (PORCINE) 1000 UNIT/ML IJ SOLN
INTRAMUSCULAR | Status: AC
  Filled 2023-11-10: qty 10

## 2023-11-10 MED ORDER — FENTANYL CITRATE (PF) 100 MCG/2ML IJ SOLN
INTRAMUSCULAR | Status: DC | PRN
  Administered 2023-11-10 (×2): 25 ug via INTRAVENOUS

## 2023-11-10 MED ORDER — ROPINIROLE HCL 1 MG PO TABS: 1.0000 mg | ORAL_TABLET | Freq: Every day | ORAL | Status: DC

## 2023-11-10 MED ORDER — SODIUM CHLORIDE 0.9 % IV SOLN: 250.0000 mL | INTRAVENOUS | Status: DC | PRN

## 2023-11-10 MED ORDER — SODIUM CHLORIDE 0.9 % IV SOLN
INTRAVENOUS | Status: DC | PRN
  Administered 2023-11-10: 10 mL/h via INTRAVENOUS

## 2023-11-10 MED ORDER — INSULIN LISPRO (1 UNIT DIAL) 100 UNIT/ML (KWIKPEN): 20.0000 [IU] | PEN_INJECTOR | Freq: Three times a day (TID) | SUBCUTANEOUS | Status: DC

## 2023-11-10 MED ORDER — SODIUM CHLORIDE 0.9% FLUSH: 10.0000 mL | Freq: Two times a day (BID) | INTRAVENOUS | Status: DC

## 2023-11-10 MED ORDER — SODIUM CHLORIDE 0.9 % WEIGHT BASED INFUSION: 1.0000 mL/kg/h | INTRAVENOUS | Status: AC

## 2023-11-10 MED ORDER — ACETAMINOPHEN 325 MG PO TABS: 650.0000 mg | ORAL_TABLET | ORAL | Status: DC | PRN

## 2023-11-10 MED ORDER — ACETAMINOPHEN 325 MG PO TABS
ORAL_TABLET | ORAL | Status: AC
  Filled 2023-11-10: qty 2

## 2023-11-10 MED ORDER — NITROGLYCERIN 1 MG/10 ML FOR IR/CATH LAB
INTRA_ARTERIAL | Status: AC
  Filled 2023-11-10: qty 10

## 2023-11-10 MED ORDER — APIXABAN 5 MG PO TABS
5.0000 mg | ORAL_TABLET | Freq: Two times a day (BID) | ORAL | Status: DC
  Administered 2023-11-11: 5 mg via ORAL
  Filled 2023-11-10: qty 1

## 2023-11-10 MED ORDER — EZETIMIBE 10 MG PO TABS
10.0000 mg | ORAL_TABLET | Freq: Every day | ORAL | Status: DC
  Administered 2023-11-11: 10 mg via ORAL
  Filled 2023-11-10: qty 1

## 2023-11-10 MED ORDER — LORATADINE 10 MG PO TABS
10.0000 mg | ORAL_TABLET | Freq: Every day | ORAL | Status: DC
  Administered 2023-11-11: 10 mg via ORAL
  Filled 2023-11-10: qty 1

## 2023-11-10 MED ORDER — SODIUM CHLORIDE 0.9% FLUSH
3.0000 mL | Freq: Two times a day (BID) | INTRAVENOUS | Status: DC
  Administered 2023-11-11 (×2): 3 mL via INTRAVENOUS

## 2023-11-10 MED ORDER — HEPARIN SODIUM (PORCINE) 1000 UNIT/ML IJ SOLN
INTRAMUSCULAR | Status: DC | PRN
  Administered 2023-11-10: 10000 [IU] via INTRAVENOUS

## 2023-11-10 MED ORDER — ASPIRIN 81 MG PO CHEW: 81.0000 mg | CHEWABLE_TABLET | ORAL | Status: DC

## 2023-11-10 MED ORDER — LIDOCAINE HCL (PF) 1 % IJ SOLN
INTRAMUSCULAR | Status: AC
  Filled 2023-11-10: qty 30

## 2023-11-10 MED ORDER — LIDOCAINE HCL (PF) 1 % IJ SOLN
INTRAMUSCULAR | Status: DC | PRN
  Administered 2023-11-10: 2 mL

## 2023-11-10 MED ORDER — ATORVASTATIN CALCIUM 80 MG PO TABS
80.0000 mg | ORAL_TABLET | Freq: Every day | ORAL | Status: DC
  Administered 2023-11-11: 80 mg via ORAL
  Filled 2023-11-10: qty 1

## 2023-11-10 MED ORDER — CLOPIDOGREL BISULFATE 75 MG PO TABS
75.0000 mg | ORAL_TABLET | Freq: Every day | ORAL | Status: DC
  Administered 2023-11-11: 75 mg via ORAL
  Filled 2023-11-10: qty 1

## 2023-11-10 MED ORDER — MIDAZOLAM HCL 2 MG/2ML IJ SOLN
INTRAMUSCULAR | Status: DC | PRN
  Administered 2023-11-10 (×2): 2 mg via INTRAVENOUS

## 2023-11-10 MED ORDER — INSULIN ASPART 100 UNIT/ML IJ SOLN
0.0000 [IU] | Freq: Three times a day (TID) | INTRAMUSCULAR | Status: DC
  Administered 2023-11-10: 2 [IU] via SUBCUTANEOUS
  Administered 2023-11-11: 3 [IU] via SUBCUTANEOUS

## 2023-11-10 MED ORDER — FENTANYL CITRATE (PF) 100 MCG/2ML IJ SOLN
INTRAMUSCULAR | Status: AC
  Filled 2023-11-10: qty 2

## 2023-11-10 SURGICAL SUPPLY — 17 items
BALL SAPPHIRE NC24 4.5X12 (BALLOONS) ×1
BALLOON SAPPHIRE NC24 4.5X12 (BALLOONS) IMPLANT
CATH OPTICROSS HD (CATHETERS) ×1 IMPLANT
CATH VISTA GUIDE 6FR XBLAD3.5 (CATHETERS) ×1 IMPLANT
DEVICE RAD COMP TR BAND LRG (VASCULAR PRODUCTS) ×1 IMPLANT
ELECT DEFIB PAD ADLT CADENCE (PAD) ×1 IMPLANT
GLIDESHEATH SLEND SS 6F .021 (SHEATH) ×1 IMPLANT
GUIDEWIRE INQWIRE 1.5J.035X260 (WIRE) IMPLANT
INQWIRE 1.5J .035X260CM (WIRE) ×1
KIT ENCORE 26 ADVANTAGE (KITS) ×1 IMPLANT
PACK CARDIAC CATHETERIZATION (CUSTOM PROCEDURE TRAY) ×2 IMPLANT
SET ATX-X65L (MISCELLANEOUS) ×1 IMPLANT
SLED PULL BACK IVUS (MISCELLANEOUS) ×1 IMPLANT
STENT SYNERGY XD 4.0X20 (Permanent Stent) IMPLANT
SYNERGY XD 4.0X20 (Permanent Stent) ×1 IMPLANT
TUBING CIL FLEX 10 FLL-RA (TUBING) ×1 IMPLANT
WIRE ASAHI PROWATER 180CM (WIRE) ×1 IMPLANT

## 2023-11-10 NOTE — Progress Notes (Signed)
[name] [name], Cardiology, notified. Upon removal of TR band; pt has minimal swelling above right radial site; no ecchymosis noted. Manual pressure with manual BP cuff held for 15 min; per protocol.

## 2023-11-10 NOTE — Interval H&P Note (Signed)
History and Physical Interval Note:  11/10/2023 2:37 PM  Shahbaz Plachy  has presented today for surgery, with the diagnosis of cad.  The various methods of treatment have been discussed with the patient and family. After consideration of risks, benefits and other options for treatment, the patient has consented to  Procedure(s): CORONARY STENT INTERVENTION (N/A) as a surgical intervention.  The patient's history has been reviewed, patient examined, no change in status, stable for surgery.  I have reviewed the patient's chart and labs.  Questions were answered to the patient's satisfaction.     Tonny Bollman

## 2023-11-10 NOTE — Plan of Care (Signed)
  Problem: Education: Goal: Understanding of CV disease, CV risk reduction, and recovery process will improve Outcome: Progressing Goal: Individualized Educational Video(s) Outcome: Progressing   Problem: Activity: Goal: Ability to return to baseline activity level will improve Outcome: Progressing   Problem: Cardiovascular: Goal: Ability to achieve and maintain adequate cardiovascular perfusion will improve Outcome: Progressing Goal: Vascular access site(s) Level 0-1 will be maintained Outcome: Progressing   Problem: Health Behavior/Discharge Planning: Goal: Ability to safely manage health-related needs after discharge will improve Outcome: Progressing   Problem: Education: Goal: Knowledge of General Education information will improve Description: Including pain rating scale, medication(s)/side effects and non-pharmacologic comfort measures Outcome: Progressing   Problem: Health Behavior/Discharge Planning: Goal: Ability to manage health-related needs will improve Outcome: Progressing   Problem: Clinical Measurements: Goal: Ability to maintain clinical measurements within normal limits will improve Outcome: Progressing Goal: Will remain free from infection Outcome: Progressing Goal: Diagnostic test results will improve Outcome: Progressing Goal: Respiratory complications will improve Outcome: Progressing Goal: Cardiovascular complication will be avoided Outcome: Progressing   Problem: Activity: Goal: Risk for activity intolerance will decrease Outcome: Progressing   Problem: Nutrition: Goal: Adequate nutrition will be maintained Outcome: Progressing   Problem: Coping: Goal: Level of anxiety will decrease Outcome: Progressing   Problem: Elimination: Goal: Will not experience complications related to bowel motility Outcome: Progressing Goal: Will not experience complications related to urinary retention Outcome: Progressing   Problem: Pain Management: Goal:  General experience of comfort will improve Outcome: Progressing   Problem: Safety: Goal: Ability to remain free from injury will improve Outcome: Progressing   Problem: Skin Integrity: Goal: Risk for impaired skin integrity will decrease Outcome: Progressing   Problem: Education: Goal: Ability to describe self-care measures that may prevent or decrease complications (Diabetes Survival Skills Education) will improve Outcome: Progressing Goal: Individualized Educational Video(s) Outcome: Progressing   Problem: Coping: Goal: Ability to adjust to condition or change in health will improve Outcome: Progressing   Problem: Fluid Volume: Goal: Ability to maintain a balanced intake and output will improve Outcome: Progressing   Problem: Health Behavior/Discharge Planning: Goal: Ability to identify and utilize available resources and services will improve Outcome: Progressing Goal: Ability to manage health-related needs will improve Outcome: Progressing   Problem: Metabolic: Goal: Ability to maintain appropriate glucose levels will improve Outcome: Progressing   Problem: Nutritional: Goal: Maintenance of adequate nutrition will improve Outcome: Progressing Goal: Progress toward achieving an optimal weight will improve Outcome: Progressing   Problem: Skin Integrity: Goal: Risk for impaired skin integrity will decrease Outcome: Progressing   Problem: Tissue Perfusion: Goal: Adequacy of tissue perfusion will improve Outcome: Progressing

## 2023-11-11 ENCOUNTER — Other Ambulatory Visit (HOSPITAL_COMMUNITY): Payer: Self-pay

## 2023-11-11 ENCOUNTER — Other Ambulatory Visit: Payer: Self-pay | Admitting: Student

## 2023-11-11 ENCOUNTER — Encounter (HOSPITAL_COMMUNITY): Payer: Self-pay | Admitting: Cardiovascular Disease

## 2023-11-11 DIAGNOSIS — I25118 Atherosclerotic heart disease of native coronary artery with other forms of angina pectoris: Secondary | ICD-10-CM | POA: Diagnosis not present

## 2023-11-11 DIAGNOSIS — Z955 Presence of coronary angioplasty implant and graft: Secondary | ICD-10-CM | POA: Diagnosis not present

## 2023-11-11 DIAGNOSIS — I10 Essential (primary) hypertension: Secondary | ICD-10-CM | POA: Diagnosis not present

## 2023-11-11 DIAGNOSIS — I480 Paroxysmal atrial fibrillation: Secondary | ICD-10-CM | POA: Diagnosis not present

## 2023-11-11 DIAGNOSIS — I25119 Atherosclerotic heart disease of native coronary artery with unspecified angina pectoris: Secondary | ICD-10-CM | POA: Diagnosis not present

## 2023-11-11 LAB — GLUCOSE, CAPILLARY
Glucose-Capillary: 141 mg/dL — ABNORMAL HIGH (ref 70–99)
Glucose-Capillary: 206 mg/dL — ABNORMAL HIGH (ref 70–99)
Glucose-Capillary: 183 mg/dL — ABNORMAL HIGH (ref 70–99)

## 2023-11-11 LAB — CBC
HCT: 48.4 % (ref 39.0–52.0)
Hemoglobin: 16.3 g/dL (ref 13.0–17.0)
MCH: 31 pg (ref 26.0–34.0)
MCHC: 33.7 g/dL (ref 30.0–36.0)
MCV: 92.2 fL (ref 80.0–100.0)
Platelets: 197 10*3/uL (ref 150–400)
RBC: 5.25 MIL/uL (ref 4.22–5.81)
RDW: 12.7 % (ref 11.5–15.5)
WBC: 8.1 10*3/uL (ref 4.0–10.5)
nRBC: 0 % (ref 0.0–0.2)

## 2023-11-11 LAB — BASIC METABOLIC PANEL
Anion gap: 6 (ref 5–15)
BUN: 24 mg/dL — ABNORMAL HIGH (ref 6–20)
CO2: 26 mmol/L (ref 22–32)
Calcium: 9 mg/dL (ref 8.9–10.3)
Chloride: 105 mmol/L (ref 98–111)
Creatinine, Ser: 1.1 mg/dL (ref 0.61–1.24)
GFR, Estimated: >60 mL/min (ref >60)
Glucose, Bld: 98 mg/dL (ref 70–99)
Potassium: 3.9 mmol/L (ref 3.5–5.1)
Sodium: 137 mmol/L (ref 135–145)

## 2023-11-11 LAB — HEMOGLOBIN A1C
Hgb A1c MFr Bld: 6.3 % — ABNORMAL HIGH (ref 4.8–5.6)
Mean Plasma Glucose: 134.11 mg/dL

## 2023-11-11 NOTE — Plan of Care (Signed)
  Problem: Education: Goal: Understanding of CV disease, CV risk reduction, and recovery process will improve Outcome: Progressing Goal: Individualized Educational Video(s) Outcome: Progressing   Problem: Activity: Goal: Ability to return to baseline activity level will improve Outcome: Progressing   Problem: Cardiovascular: Goal: Ability to achieve and maintain adequate cardiovascular perfusion will improve Outcome: Progressing Goal: Vascular access site(s) Level 0-1 will be maintained Outcome: Progressing   Problem: Health Behavior/Discharge Planning: Goal: Ability to safely manage health-related needs after discharge will improve Outcome: Progressing   Problem: Education: Goal: Knowledge of General Education information will improve Description: Including pain rating scale, medication(s)/side effects and non-pharmacologic comfort measures Outcome: Progressing   Problem: Health Behavior/Discharge Planning: Goal: Ability to manage health-related needs will improve Outcome: Progressing   Problem: Clinical Measurements: Goal: Ability to maintain clinical measurements within normal limits will improve Outcome: Progressing Goal: Will remain free from infection Outcome: Progressing Goal: Diagnostic test results will improve Outcome: Progressing Goal: Respiratory complications will improve Outcome: Progressing Goal: Cardiovascular complication will be avoided Outcome: Progressing   Problem: Activity: Goal: Risk for activity intolerance will decrease Outcome: Progressing   Problem: Nutrition: Goal: Adequate nutrition will be maintained Outcome: Progressing   Problem: Coping: Goal: Level of anxiety will decrease Outcome: Progressing   Problem: Elimination: Goal: Will not experience complications related to bowel motility Outcome: Progressing Goal: Will not experience complications related to urinary retention Outcome: Progressing   Problem: Pain Management: Goal:  General experience of comfort will improve Outcome: Progressing   Problem: Safety: Goal: Ability to remain free from injury will improve Outcome: Progressing   Problem: Skin Integrity: Goal: Risk for impaired skin integrity will decrease Outcome: Progressing   Problem: Education: Goal: Ability to describe self-care measures that may prevent or decrease complications (Diabetes Survival Skills Education) will improve Outcome: Progressing Goal: Individualized Educational Video(s) Outcome: Progressing   Problem: Coping: Goal: Ability to adjust to condition or change in health will improve Outcome: Progressing   Problem: Fluid Volume: Goal: Ability to maintain a balanced intake and output will improve Outcome: Progressing   Problem: Health Behavior/Discharge Planning: Goal: Ability to identify and utilize available resources and services will improve Outcome: Progressing Goal: Ability to manage health-related needs will improve Outcome: Progressing   Problem: Metabolic: Goal: Ability to maintain appropriate glucose levels will improve Outcome: Progressing   Problem: Nutritional: Goal: Maintenance of adequate nutrition will improve Outcome: Progressing Goal: Progress toward achieving an optimal weight will improve Outcome: Progressing   Problem: Skin Integrity: Goal: Risk for impaired skin integrity will decrease Outcome: Progressing   Problem: Tissue Perfusion: Goal: Adequacy of tissue perfusion will improve Outcome: Progressing

## 2023-11-11 NOTE — Discharge Summary (Addendum)
Discharge Summary    Patient ID: [name] [name] MRN: [id number]; DOB: 02/25/1961  Admit date: 11/10/2023 Discharge date: 11/11/2023  PCP:  Rocky Morel, DO   [facility] Providers Cardiologist:  Tonny Bollman, MD     Discharge Diagnoses    Principal Problem:   Coronary artery disease of native artery of native heart with stable angina pectoris [facility]) Active Problems:   HLD (hyperlipidemia)   Resistant hypertension   Paroxysmal atrial fibrillation [facility])   Diagnostic Studies/Procedures    Cath: 11/10/2023  Successful IVUS guided PCI of severe ulcerated stenosis in the proximal LAD, reducing 70% stenosis to 0% with a 4.0 x 16 mm Synergy DES   Recommendations: Continue clopidogrel without interruption for at least another 6 months.  Resume apixaban tomorrow.  No concomitant aspirin until clopidogrel discontinued.  Anticipate hospital discharge tomorrow morning.   IVUS findings: Reference vessel diameter 4.5 mm maximum.  Lesion is calcified and ulcerated also with concentric fibrous plaquing noted.  Lesion extends back to the ostium of the LAD.   Post PCI IVUS findings: Stent well-expanded, well apposed throughout with good proximal and distal transitions.  Diagnostic Dominance: Right  Intervention   _____________   History of Present Illness     [name] [name] is a 62 y.o. male with past medical history of CAD s/p inferior lateral STEMI 06/2023 with PCI to circumflex, paroxysmal atrial fibrillation, hypertension, hyperlipidemia, diabetes, OSA who has been followed by Dr. Excell Seltzer as an outpatient.  He was recently seen in the office on 11/5 for follow-up after wearing a monitor done by his primary care which showed less than 1% of A-fib burden.  Of note his Brilinta and aspirin were stopped as he was started on Eliquis and clopidogrel.  He presented with an inferior lateral STEMI July 2024 and underwent DES to mid/distal circumflex with residual 70% ulcerated  plaque that was managed medically.  At this office visit he noted to some jaw pain with his rapid heart rates which were likely due to demand ischemia.  His case was reviewed with Dr. Excell Seltzer with recommendations to proceed with outpatient cardiac catheterization with IVUS/PCI of LAD.  Hospital Course     Underwent outpatient cardiac catheterization with successful IVUS guided PCI of ulcerated stenosis in the proximal LAD reducing 70% lesion to 0%.  Recommendations to continue Plavix without interruption for at least 6 months and resume Eliquis.  No plans for concomitant aspirin until Plavix is discontinued.  He was observed overnight without complications.  Seen by cardiac rehab.  General: Well developed, well nourished, male appearing in no acute distress. Head: Normocephalic, atraumatic.  Neck: Supple without bruits, JVD. Lungs:  Resp regular and unlabored, CTA. Heart: RRR, S1, S2, no S3, S4, or murmur; no rub. Abdomen: Soft, non-tender, non-distended with normoactive bowel sounds. No hepatomegaly. No rebound/guarding. No obvious abdominal masses. Extremities: No clubbing, cyanosis, edema. Distal pedal pulses are 2+ bilaterally. Right radial cath site stable without bruising or hematoma Neuro: Alert and oriented X 3. Moves all extremities spontaneously. Psych: Normal affect.  Patient seen by Dr. Excell Seltzer and deemed stable for discharge home. Follow up arranged in the office.  Did the patient have an acute coronary syndrome (MI, NSTEMI, STEMI, etc) this admission?:  No                               Did the patient have a percutaneous coronary intervention (stent / angioplasty)?:  Yes.  Cath/PCI Registry Performance & Quality Measures: Aspirin prescribed? - No - on Eliquis ADP Receptor Inhibitor (Plavix/Clopidogrel, Brilinta/Ticagrelor or Effient/Prasugrel) prescribed (includes medically managed patients)? - Yes High Intensity Statin (Lipitor 40-80mg  or Crestor 20-40mg ) prescribed? -  Yes For EF <40%, was ACEI/ARB prescribed? - Not Applicable (EF >/= 40%) For EF <40%, Aldosterone Antagonist (Spironolactone or Eplerenone) prescribed? - Not Applicable (EF >/= 40%) Cardiac Rehab Phase II ordered? - Yes  _____________  Discharge Vitals Blood pressure (!) 152/81, pulse 67, temperature 98.1 F (36.7 C), temperature source Oral, resp. rate 18, height 5\' 10"  (1.778 m), weight 90.3 kg, SpO2 96%.  Filed Weights   11/10/23 1303  Weight: 90.3 kg    Labs & Radiologic Studies    CBC Recent Labs    11/08/23 0908 11/11/23 0606  WBC 7.1 8.1  HGB 16.6 16.3  HCT 50.6 48.4  MCV 96 92.2  PLT 214 197   Basic Metabolic Panel Recent Labs    29/56/21 0908 11/11/23 0606  NA 141 137  K 4.6 3.9  CL 104 105  CO2 25 26  GLUCOSE 153* 98  BUN 22 24*  CREATININE 0.99 1.10  CALCIUM 9.7 9.0   Liver Function Tests No results for input(s): "AST", "ALT", "ALKPHOS", "BILITOT", "PROT", "ALBUMIN" in the last 72 hours. No results for input(s): "LIPASE", "AMYLASE" in the last 72 hours. High Sensitivity Troponin:   No results for input(s): "TROPONINIHS" in the last 720 hours.  BNP Invalid input(s): "POCBNP" D-Dimer No results for input(s): "DDIMER" in the last 72 hours. Hemoglobin A1C No results for input(s): "HGBA1C" in the last 72 hours. Fasting Lipid Panel No results for input(s): "CHOL", "HDL", "LDLCALC", "TRIG", "CHOLHDL", "LDLDIRECT" in the last 72 hours. Thyroid Function Tests No results for input(s): "TSH", "T4TOTAL", "T3FREE", "THYROIDAB" in the last 72 hours.  Invalid input(s): "FREET3" _____________  CARDIAC CATHETERIZATION  Result Date: 11/10/2023 Successful IVUS guided PCI of severe ulcerated stenosis in the proximal LAD, reducing 70% stenosis to 0% with a 4.0 x 16 mm Synergy DES Recommendations: Continue clopidogrel without interruption for at least another 6 months.  Resume apixaban tomorrow.  No concomitant aspirin until clopidogrel discontinued.  Anticipate  hospital discharge tomorrow morning. IVUS findings: Reference vessel diameter 4.5 mm maximum.  Lesion is calcified and ulcerated also with concentric fibrous plaquing noted.  Lesion extends back to the ostium of the LAD. Post PCI IVUS findings: Stent well-expanded, well apposed throughout with good proximal and distal transitions.  Disposition   Pt is being discharged home today in good condition.  Follow-up Plans & Appointments     Discharge Instructions     AMB Referral to Cardiac Rehabilitation - Phase II   Complete by: As directed    Diagnosis: Coronary Stents   After initial evaluation and assessments completed: Virtual Based Care may be provided alone or in conjunction with Phase 2 Cardiac Rehab based on patient barriers.: Yes   Intensive Cardiac Rehabilitation (ICR) [facility] location only OR Traditional Cardiac Rehabilitation (TCR) *If criteria for ICR are not met will enroll in TCR [facility] only): Yes   Call MD for:  redness, tenderness, or signs of infection (pain, swelling, redness, odor or green/yellow discharge around incision site)   Complete by: As directed    Diet - low sodium heart healthy   Complete by: As directed    Discharge instructions   Complete by: As directed    Radial Site Care Refer to this sheet in the next few weeks. These instructions provide you with information  on caring for yourself after your procedure. Your caregiver may also give you more specific instructions. Your treatment has been planned according to current medical practices, but problems sometimes occur. Call your caregiver if you have any problems or questions after your procedure. HOME CARE INSTRUCTIONS You may shower the day after the procedure. Remove the bandage (dressing) and gently wash the site with plain soap and water. Gently pat the site dry.  Do not apply powder or lotion to the site.  Do not submerge the affected site in water for 3 to 5 days.  Inspect the site at least twice daily.  Do not  flex or bend the affected arm for 24 hours.  No lifting over 5 pounds (2.3 kg) for 5 days after your procedure.  Do not drive home if you are discharged the same day of the procedure. Have someone else drive you.  You may drive 24 hours after the procedure unless otherwise instructed by your caregiver.  What to expect: Any bruising will usually fade within 1 to 2 weeks.  Blood that collects in the tissue (hematoma) may be painful to the touch. It should usually decrease in size and tenderness within 1 to 2 weeks.  SEEK IMMEDIATE MEDICAL CARE IF: You have unusual pain at the radial site.  You have redness, warmth, swelling, or pain at the radial site.  You have drainage (other than a small amount of blood on the dressing).  You have chills.  You have a fever or persistent symptoms for more than 72 hours.  You have a fever and your symptoms suddenly get worse.  Your arm becomes pale, cool, tingly, or numb.  You have heavy bleeding from the site. Hold pressure on the site.   PLEASE DO NOT MISS ANY DOSES OF YOUR PLAVIX!!!!! Also keep a log of you blood pressures and bring back to your follow up appt. Please call the office with any questions.   Patients taking blood thinners should generally stay away from medicines like ibuprofen, Advil, Motrin, naproxen, and Aleve due to risk of stomach bleeding. You may take Tylenol as directed or talk to your primary doctor about alternatives.   PLEASE ENSURE THAT YOU DO NOT RUN OUT OF YOUR PLAVIX. This medication is very important to remain on for at least 6 months. IF you have issues obtaining this medication due to cost please CALL the office 3-5 business days prior to running out in order to prevent missing doses of this medication.   Increase activity slowly   Complete by: As directed         Discharge Medications   Allergies as of 11/11/2023       Reactions   Pravastatin Other (See Comments)   Joint myalgias   Trazodone And Nefazodone     Had restless legs   Hydrochlorothiazide Other (See Comments)   lightheadedness   Metformin And Related Diarrhea        Medication List     TAKE these medications    acetaminophen 500 MG tablet Commonly known as: TYLENOL Take 1,000 mg by mouth every 6 (six) hours as needed for moderate pain (pain score 4-6).   apixaban 5 MG Tabs tablet Commonly known as: ELIQUIS Take 1 tablet (5 mg total) by mouth 2 (two) times daily.   atorvastatin 80 MG tablet Commonly known as: LIPITOR Take 1 tablet (80 mg total) by mouth daily.   carvedilol 12.5 MG tablet Commonly known as: COREG Take 1.5 tablets (18.75 mg total)  by mouth 2 (two) times daily.   cetirizine 10 MG tablet Commonly known as: ZYRTEC Take 1 tablet (10 mg total) by mouth daily.   clopidogrel 75 MG tablet Commonly known as: Plavix Take 1 tablet (75 mg total) by mouth daily.   empagliflozin 10 MG Tabs tablet Commonly known as: Jardiance Take 1 tablet (10 mg total) by mouth daily before breakfast.   ezetimibe 10 MG tablet Commonly known as: ZETIA Take 1 tablet (10 mg total) by mouth daily.   fluticasone 50 MCG/ACT nasal spray Commonly known as: FLONASE Place 1 spray into both nostrils as needed for allergies or rhinitis.   insulin lispro 100 UNIT/ML KwikPen Commonly known as: HumaLOG KwikPen Inject 20 Units into the skin 3 (three) times daily.   Insulin Pen Needle 32G X 4 MM Misc 1 Device by Does not apply route in the morning, at noon, in the evening, and at bedtime.   Lantus SoloStar 100 UNIT/ML Solostar Pen Generic drug: insulin glargine Inject 55 Units into the skin daily.   metoprolol tartrate 25 MG tablet Commonly known as: LOPRESSOR Take 1 tablet (25 mg total) by mouth daily as needed (palpitations).   nitroGLYCERIN 0.4 MG SL tablet Commonly known as: NITROSTAT Place 1 tablet (0.4 mg total) under the tongue every 5 (five) minutes as needed for chest pain.   rOPINIRole 1 MG tablet Commonly known as:  REQUIP Take 1 tablet (1 mg total) by mouth daily.   Semaglutide(0.25 or 0.5MG /DOS) 2 MG/3ML Sopn Inject 0.5 mg into the skin once a week.   triamcinolone cream 0.1 % Commonly known as: KENALOG Apply 1 Application topically 2 (two) times daily. What changed:  when to take this reasons to take this   Trintellix 20 MG Tabs tablet Generic drug: vortioxetine HBr Take 1 tablet (20 mg total) by mouth daily.        Outstanding Labs/Studies   N/a   Duration of Discharge Encounter   Greater than 30 minutes including physician time.  Signed, Laverda Page, NP 11/11/2023, 8:08 AM  Patient seen, examined. Available data reviewed. Agree with findings, assessment, and plan as outlined by Laverda Page, NP.  The patient is independently interviewed and examined.  He is alert, oriented, no distress.  Lungs are clear to auscultation bilaterally, heart is regular rate and rhythm no murmur gallop, abdomen soft nontender, extremities have no edema, right radial cath site is clear with no hematoma or ecchymosis.  The patient is clinically stable and ready for discharge today.  I reinforced the need to stay on clopidogrel once daily and the importance of compliance with this.  He will resume apixaban today for anticoagulation in the setting of paroxysmal atrial fibrillation.  Otherwise as outlined above.  Tonny Bollman, M.D. 11/11/2023 10:59 AM

## 2023-11-11 NOTE — Care Management (Signed)
  Transition of Care [facility]) Screening Note   Patient Details  Name: Calex Quirindongo Date of Birth: 26-May-1961   Transition of Care [facility]) CM/SW Contact:    Lockie Pares, RN Phone Number: 11/11/2023, 8:48 AM    Transition of Care Department [facility]) has reviewed patient and no TOC needs have been identified at this time. We will continue to monitor patient advancement through interdisciplinary progression rounds. If new patient transition needs arise, please place a TOC consult.

## 2023-11-11 NOTE — Progress Notes (Signed)
CARDIAC REHAB PHASE I   PRE:  Rate/Rhythm: 84 NSR  BP:  Sitting: 152/81      SpO2: 98 RA  MODE:  Ambulation: 470 ft    POST:  Rate/Rhythm: 94 NSR  BP:  Sitting: 142/93      SpO2: 98 RA  Pt ambulated in the hallway with supervision assistance, pt walked well w/o unusual symptoms.   Pt was educated on stent card, stent location, Antiplatelet and ASA use, wt restrictions, no baths/daily wash-ups, s/s of infection, ex guidelines, s/s to stop exercising, NTG use and calling 911, heart healthy diet, risk factors and CRPII. Pt received materials on exercise, diet, and CRPII. Will refer to [facility].    Faustino Congress  MS, ACSM-CEP 9:25 AM 11/11/2023    Service time is from 0904 to 0924.

## 2023-11-12 ENCOUNTER — Other Ambulatory Visit (HOSPITAL_COMMUNITY): Payer: Self-pay

## 2023-11-12 ENCOUNTER — Other Ambulatory Visit: Payer: Self-pay

## 2023-11-12 MED ORDER — BD PEN NEEDLE NANO U/F 32G X 4 MM MISC
0 refills | Status: DC
  Filled 2023-11-12: qty 100, 100d supply, fill #0

## 2023-11-12 MED ORDER — VORTIOXETINE HBR 20 MG PO TABS
20.0000 mg | ORAL_TABLET | Freq: Every day | ORAL | 0 refills | Status: DC
  Filled 2023-11-12 – 2023-11-22 (×2): qty 30, 30d supply, fill #0

## 2023-11-12 MED ORDER — CETIRIZINE HCL 10 MG PO TABS
10.0000 mg | ORAL_TABLET | Freq: Every day | ORAL | 0 refills | Status: DC
  Filled 2023-11-12: qty 100, 100d supply, fill #0

## 2023-11-15 ENCOUNTER — Other Ambulatory Visit (HOSPITAL_COMMUNITY): Payer: Self-pay

## 2023-11-15 ENCOUNTER — Other Ambulatory Visit: Payer: Self-pay

## 2023-11-16 ENCOUNTER — Telehealth (HOSPITAL_COMMUNITY): Payer: Self-pay

## 2023-11-16 NOTE — Telephone Encounter (Signed)
Called and spoke with pt in regards to CR, pt stated he is not able to participate at this time due to his work schedule.   Closed referral 

## 2023-11-22 ENCOUNTER — Other Ambulatory Visit: Payer: Self-pay | Admitting: Cardiology

## 2023-11-22 ENCOUNTER — Other Ambulatory Visit: Payer: Self-pay

## 2023-11-22 ENCOUNTER — Other Ambulatory Visit (HOSPITAL_COMMUNITY): Payer: Self-pay

## 2023-11-22 ENCOUNTER — Telehealth: Payer: Self-pay | Admitting: Cardiovascular Disease

## 2023-11-22 NOTE — Telephone Encounter (Signed)
Pt states he would like to speak with a pharmacist regarding his medication called Zetia 10mg . Please advise

## 2023-11-22 NOTE — Telephone Encounter (Addendum)
Patient reports that he has new-onset aches in his legs, arms, hips and joints which started a couple days after initiating ezetimibe 10 mg PO daily in Oct 2024. Not able to tolerate the medication. Continues to take atorvastatin 80 mg PO daily. He is inquiring about attempting to obtain coverage for alternate lipid-lowering medication, Repatha. Pt has Medicaid insurance which required trial of ezetimibe prior to covering Repatha. PA last submitted on 10/28, likely will need to wait until at least 11/27 (30 days) to resubmit. LDL-C of 82 mg/dL uncontrolled above goal <55 mg/dL given CAD + O9GE.   Plan - Stop ezetimibe - Will resubmit PA for Repatha on 11/27 given intolerance to ezetimibe - Continue atorvastatin 80 mg PO daily - Plan for repeat lipid panel 2-3 months after initiating Repatha  Nils Pyle, PharmD PGY1 Pharmacy Resident

## 2023-11-24 ENCOUNTER — Other Ambulatory Visit (HOSPITAL_COMMUNITY): Payer: Self-pay

## 2023-11-24 ENCOUNTER — Telehealth: Payer: Self-pay | Admitting: Pharmacy Technician

## 2023-11-24 DIAGNOSIS — I25118 Atherosclerotic heart disease of native coronary artery with other forms of angina pectoris: Secondary | ICD-10-CM

## 2023-11-24 DIAGNOSIS — E7800 Pure hypercholesterolemia, unspecified: Secondary | ICD-10-CM

## 2023-11-24 MED ORDER — ACETAMINOPHEN 325 MG PO TABS
650.0000 mg | ORAL_TABLET | ORAL | 0 refills | Status: DC | PRN
  Filled 2023-11-24: qty 250, 21d supply, fill #0

## 2023-11-24 MED ORDER — REPATHA SURECLICK 140 MG/ML ~~LOC~~ SOAJ: 1.0000 mL | SUBCUTANEOUS | 3 refills | Status: DC

## 2023-11-24 NOTE — Telephone Encounter (Signed)
Pharmacy Patient Advocate Encounter   Received notification from Pt Calls Messages that prior authorization for repatha is required/requested.   Insurance verification completed.   The patient is insured through [facility] .   Per test claim: PA required; PA submitted to above mentioned insurance via Fax Key/confirmation #/EOC faxed  Status is pending

## 2023-11-24 NOTE — Telephone Encounter (Signed)
Attempted to contact pt to notify of Repatha PA approval. LVM. Will cont to follow.   Nils Pyle, PharmD PGY1 Pharmacy Resident

## 2023-11-24 NOTE — Addendum Note (Signed)
Addended by: Malena Peer D on: 11/24/2023 02:41 PM   Modules accepted: Orders

## 2023-11-24 NOTE — Telephone Encounter (Signed)
Pharmacy Patient Advocate Encounter  Received notification from [facility] that Prior Authorization for repatha has been APPROVED from 10/25/23 to 11/23/24   PA #/Case ID/Reference #: [id number]

## 2023-11-24 NOTE — Telephone Encounter (Signed)
Pt called back. Confirmed pharmacy. Rx sent. Labs ordered. Pt aware to go for lab work in 2-3 months.

## 2023-11-26 ENCOUNTER — Other Ambulatory Visit: Payer: Self-pay

## 2023-11-26 ENCOUNTER — Other Ambulatory Visit (HOSPITAL_COMMUNITY): Payer: Self-pay

## 2023-11-29 ENCOUNTER — Telehealth: Payer: Self-pay | Admitting: Cardiovascular Disease

## 2023-11-29 NOTE — Telephone Encounter (Signed)
Pt states to get the Repatha approved . Call [facility] [phone]

## 2023-11-30 ENCOUNTER — Other Ambulatory Visit (HOSPITAL_COMMUNITY): Payer: Self-pay

## 2023-12-02 ENCOUNTER — Telehealth: Payer: Self-pay | Admitting: Pharmacy Technician

## 2023-12-02 ENCOUNTER — Other Ambulatory Visit (HOSPITAL_COMMUNITY): Payer: Self-pay

## 2023-12-02 NOTE — Telephone Encounter (Signed)
Pharmacy Patient Advocate Encounter   Received notification from Phone that prior authorization for repatha is required/requested.   Insurance verification completed.   The patient is insured through [facility] .   Per test claim: PA required; PA submitted to above mentioned insurance via CoverMyMeds Key/confirmation #/EOC [id number] Status is pending

## 2023-12-03 NOTE — Telephone Encounter (Signed)
Faxed appeal on 12/03/23 to verify labs

## 2023-12-04 NOTE — Telephone Encounter (Signed)
Pharmacy Patient Advocate Encounter  Received notification from [facility] that Prior Authorization for repatha has been APPROVED from 12/04/23 to 12/03/24   PA #/Case ID/Reference #: [id number]

## 2023-12-06 ENCOUNTER — Other Ambulatory Visit: Payer: Self-pay | Admitting: Cardiovascular Disease

## 2023-12-06 ENCOUNTER — Telehealth: Payer: Self-pay | Admitting: Cardiovascular Disease

## 2023-12-06 NOTE — Telephone Encounter (Signed)
Left voicemail to return call to office.

## 2023-12-06 NOTE — Telephone Encounter (Signed)
Pt c/o medication issue:  1. Name of Medication:   Evolocumab (REPATHA SURECLICK) 140 MG/ML SOAJ    2. How are you currently taking this medication (dosage and times per day)?  Inject 140 mg into the skin every 14 (fourteen) days.       3. Are you having a reaction (difficulty breathing--STAT)? No  4. What is your medication issue? Pt is requesting a callback regarding him trying to pick up his medication today and they wouldn't release it to him since they hadn't received the PA approval. Pt stated he thought it was sent to pharm already so he's unsure of why they hadn't received it but he needs his medication. Pt stated if no answer leave a detailed message due to him being at work or if nurse could contact the pharm it would be very helpful. Please advise

## 2023-12-07 ENCOUNTER — Ambulatory Visit (INDEPENDENT_AMBULATORY_CARE_PROVIDER_SITE_OTHER): Payer: Medicaid Other

## 2023-12-07 ENCOUNTER — Encounter: Payer: Self-pay | Admitting: Cardiology

## 2023-12-07 ENCOUNTER — Ambulatory Visit: Payer: Medicaid Other | Attending: Cardiology | Admitting: Cardiology

## 2023-12-07 ENCOUNTER — Other Ambulatory Visit (HOSPITAL_COMMUNITY): Payer: Self-pay

## 2023-12-07 VITALS — BP 130/86 | HR 74 | Ht 70.0 in | Wt 211.6 lb

## 2023-12-07 DIAGNOSIS — D6869 Other thrombophilia: Secondary | ICD-10-CM | POA: Diagnosis not present

## 2023-12-07 DIAGNOSIS — I480 Paroxysmal atrial fibrillation: Secondary | ICD-10-CM

## 2023-12-07 DIAGNOSIS — G4733 Obstructive sleep apnea (adult) (pediatric): Secondary | ICD-10-CM

## 2023-12-07 DIAGNOSIS — I2510 Atherosclerotic heart disease of native coronary artery without angina pectoris: Secondary | ICD-10-CM

## 2023-12-07 DIAGNOSIS — I10 Essential (primary) hypertension: Secondary | ICD-10-CM

## 2023-12-07 MED ORDER — DRONEDARONE HCL 400 MG PO TABS: 400.0000 mg | ORAL_TABLET | Freq: Two times a day (BID) | ORAL | 3 refills | Status: DC

## 2023-12-07 NOTE — Patient Instructions (Addendum)
Medication Instructions:  Your physician has recommended you make the following change in your medication:  1) START taking Multaq 400 mg twice daily - one week after you start wearing your heart monitor *If you need a refill on your cardiac medications before your next appointment, please call your pharmacy*  Lab Work: BMET and CBC at [facility] - prior to CT scan  Testing/Procedures: Your physician has recommended that you wear an event monitor. Event monitors are medical devices that record the heart's electrical activity. Doctors most often [place] these monitors to diagnose arrhythmias. Arrhythmias are problems with the speed or rhythm of the heartbeat. The monitor is a small, portable device. You can wear one while you do your normal daily activities. This is usually used to diagnose what is causing palpitations/syncope (passing out).  Your physician has requested that you have cardiac CT. Cardiac computed tomography (CT) is a painless test that uses an x-ray machine to take clear, detailed pictures of your heart. Please follow instruction sheet as given. We will call you to schedule your CT scan. It will be done about three weeks prior to your ablation.   Your physician has recommended that you have an ablation. Catheter ablation is a medical procedure used to treat some cardiac arrhythmias (irregular heartbeats). During catheter ablation, a long, thin, flexible tube is put into a blood vessel in your groin (upper thigh), or neck. This tube is called an ablation catheter. It is then guided to your heart through the blood vessel. Radio frequency waves destroy small areas of heart tissue where abnormal heartbeats may cause an arrhythmia to start.  You are scheduled for Atrial Fibrillation Ablation on Monday, February 3 with Dr. Michele Rockers.Please arrive at the Main Entrance A at [facility]: [address] [place], [place] [address] at 5:30 AM    Follow-Up: At [facility], you and  your health needs are our priority.  As part of our continuing mission to provide you with exceptional heart care, we have created designated Provider Care Teams.  These Care Teams include your primary Cardiologist (physician) and Advanced Practice Providers (APPs -  Physician Assistants and Nurse Practitioners) who all work together to provide you with the care you need, when you need it.  Your next appointment:   1/24 at 9:30am  Provider:   Nobie Putnam, MD    Other Instructions  Christena Deem- Long Term Monitor Instructions  Your physician has requested you wear a ZIO patch monitor for 14 days.  This is a single patch monitor. [employer] supplies one patch monitor per enrollment. Additional stickers are not available. Please do not apply patch if you will be having a Nuclear Stress Test,  Echocardiogram, Cardiac CT, MRI, or Chest Xray during the period you would be wearing the  monitor. The patch cannot be worn during these tests. You cannot remove and re-apply the  ZIO XT patch monitor.  Your ZIO patch monitor will be mailed 3 day USPS to your address on file. It may take 3-5 days  to receive your monitor after you have been enrolled.  Once you have received your monitor, please review the enclosed instructions. Your monitor  has already been registered assigning a specific monitor serial # to you.  Billing and Patient Assistance Program Information  We have supplied [employer] with any of your insurance information on file for billing purposes. [employer] offers a sliding scale Patient Assistance Program for patients that do not have  insurance, or whose insurance does  not completely cover the cost of the ZIO monitor.  You must apply for the Patient Assistance Program to qualify for this discounted rate.  To apply, please call [employer] at [phone], select option 4, select option 2, ask to apply for  Patient Assistance Program. Meredeth Ide will ask your household income, and how many people  are in  your household. They will quote your out-of-pocket cost based on that information.  [employer] will also be able to set up a 74-month, interest-free payment plan if needed.  Applying the monitor   Shave hair from upper left chest.  Hold abrader disc by orange tab. Rub abrader in 40 strokes over the upper left chest as  indicated in your monitor instructions.  Clean area with 4 enclosed alcohol pads. Let dry.  Apply patch as indicated in monitor instructions. Patch will be placed under collarbone on left  side of chest with arrow pointing upward.  Rub patch adhesive wings for 2 minutes. Remove white label marked "1". Remove the white  label marked "2". Rub patch adhesive wings for 2 additional minutes.  While looking in a mirror, press and release button in center of patch. A small green light will  flash 3-4 times. This will be your only indicator that the monitor has been turned on.  Do not shower for the first 24 hours. You may shower after the first 24 hours.  Press the button if you feel a symptom. You will hear a small click. Record Date, Time and  Symptom in the Patient Logbook.  When you are ready to remove the patch, follow instructions on the last 2 pages of Patient  Logbook. Stick patch monitor onto the last page of Patient Logbook.  Place Patient Logbook in the blue and white box. Use locking tab on box and tape box closed  securely. The blue and white box has prepaid postage on it. Please place it in the mailbox as  soon as possible. Your physician should have your test results approximately 7 days after the  monitor has been mailed back to [facility].  Call [facility] Customer Care at [phone] if you have questions regarding  your ZIO XT patch monitor. Call them immediately if you see an orange light blinking on your  monitor.  If your monitor falls off in less than 4 days, contact our Monitor department at [phone].  If your monitor becomes loose or falls off  after 4 days call [employer] at [phone] for  suggestions on securing your monitor

## 2023-12-07 NOTE — Progress Notes (Unsigned)
Enrolled for [employer] to mail a ZIO XT long term holter monitor to the patients address on file.   ZIO XT serial # [id number] mailed to patient 12/07/23, applied in office using tincture of benzoin.

## 2023-12-07 NOTE — Progress Notes (Signed)
Electrophysiology Office Note:   Date:  12/07/2023  ID:  [name] [name], DOB [birth year], MRN [id number]  Primary Cardiologist: Tonny Bollman, MD Electrophysiologist: Nobie Putnam, MD      History of Present Illness:   [name] [name] is a 62 y.o. male with h/o CAD s/p inferior lateral STEMI 06/2023 with PCI to circumflex and pLAD PCI 11/10/23, paroxysmal atrial fibrillation, hypertension, hyperlipidemia, diabetes, OSA  seen today for evaluation of his atrial fibrillation at the request of Tonny Bollman, MD.  Patient reports that he first had an episode of atrial fibrillation around the time of his heart attack in July 2024.  He did wear a heart monitor after endorsing these palpitations to his cardiologist and was found to have atrial fibrillation, but he only was able to wear the monitor for 3 days.His primary symptoms are palpitations, feeling his heart racing and skipping, which usually occur in the evening.  They were occurring almost daily but since he has started beta-blocker they have decreased in frequency to 3-4 times in the past 2 weeks.  Episodes have lasted as long as 30 minutes to 3 hours in duration.  Sometimes episodes are accompanied by lightheadedness and feeling flushed.  He is otherwise doing relatively well and has no new or acute complaints today.  Review of systems complete and found to be negative unless listed in HPI.   EP Information / Studies Reviewed:    EKG is not ordered today. EKG from 11/12/23 reviewed which showed NSR.      Echo 07/26/23:  Low normal LV systolic function.  LVEF 50 to 55%.  Mild concentric LVH.  Grade 1 diastolic dysfunction. Normal RV size and function. No significant valvular disease. Left and right atrium are normal in size.  Zio Monitor 08/2023 Worn for only 3 days. 5 total AF episodes, longest lasting 20 minutes.  Risk Assessment/Calculations:    CHA2DS2-VASc Score = 3   This indicates a 3.2% annual risk of stroke. The patient's  score is based upon: CHF History: 0 HTN History: 1 Diabetes History: 1 Stroke History: 0 Vascular Disease History: 1 Age Score: 0 Gender Score: 0             Physical Exam:   VS:  BP 130/86 (BP Location: Left Arm, Patient Position: Sitting, Cuff Size: Large)   Pulse 74   Ht 5\' 10"  (1.778 m)   Wt 211 lb 9.6 oz (96 kg)   SpO2 97%   BMI 30.36 kg/m    Wt Readings from Last 3 Encounters:  12/07/23 211 lb 9.6 oz (96 kg)  11/10/23 199 lb (90.3 kg)  11/08/23 209 lb (94.8 kg)     GEN: Well nourished, well developed in no acute distress NECK: No JVD. CARDIAC: Normal rate and regular rhythm. RESPIRATORY:  Clear to auscultation without rales, wheezing or rhonchi  ABDOMEN: Soft, non-tender, non-distended EXTREMITIES:  No edema; No deformity   ASSESSMENT AND PLAN:   [name] [name] is a 62 y.o. male with h/o CAD s/p inferior lateral STEMI 06/2023 with PCI to circumflex and pLAD PCI 11/10/23, paroxysmal atrial fibrillation, hypertension, hyperlipidemia, diabetes, OSA  seen today for evaluation of his atrial fibrillation at the request of Tonny Bollman, MD.  #. Paroxysmal atrial fibrillation, symptomatic:  -Continue carvedilol. -He only wore his Zio monitor for 3 days.  We will repeat Zio and he will try to wear for 2 weeks so that we can fully understand his AF burden and confirm. -Discussed treatment options today for AF including antiarrhythmic  drug therapy and ablation. Discussed risks, recovery and likelihood of success with each treatment strategy. Risk, benefits, and alternatives to EP study and ablation for afib were discussed. These risks include but are not limited to stroke, bleeding, vascular damage, tamponade, perforation, damage to the esophagus, lungs, phrenic nerve and other structures, pulmonary vein stenosis, worsening renal function, coronary vasospasm and death.  Discussed potential need for repeat ablation procedures and antiarrhythmic drugs after an initial ablation. The  patient understands these risk and wishes to proceed.  We will therefore proceed with catheter ablation at the next available time.  Carto, ICE, anesthesia are requested for the procedure.  Will also obtain CT PV protocol prior to the procedure to exclude LAA thrombus and further evaluate atrial anatomy. -After he completes his Zio monitor he will start Multaq as a bridge to ablation.  #. Secondary hypercoagulable state due to atrial fibrillation:  -CHA2DS2-VASc score of 3. -Continue Eliquis.  #. CAD: Inferior lateral STEMI 06/2023 with PCI to circumflex and pLAD PCI 11/10/23 -No chest pain currently. -Continue Plavix, statin, beta-blocker.  #. Hypertension -At goal today.  Recommend checking blood pressures 1-2 times per week at home and recording the values.  Recommend bringing these recordings to the primary care physician.  #. OSA: -He currently has a referral for another sleep evaluation.  Scheduling is pending.  Encouraged him to go forward with sleep study.  Follow up with Dr. Jimmey Ralph  2 weeks prior to scheduled ablation date.    Total time of encounter: 60 minutes total time of encounter, including chart review, face-to-face patient care, coordination of care and counseling regarding high complexity medical decision making.   Signed, Nobie Putnam, MD

## 2023-12-08 ENCOUNTER — Ambulatory Visit: Payer: Medicaid Other | Admitting: Physician Assistant

## 2023-12-08 NOTE — Telephone Encounter (Signed)
Spoke with [employer].  Pt plan only allows 1 month supply at a time on [place].  Patient picked up today.

## 2023-12-10 ENCOUNTER — Telehealth: Payer: Self-pay | Admitting: Cardiovascular Disease

## 2023-12-10 NOTE — Telephone Encounter (Signed)
Patient stated he received his heart monitor and wants to get assistance placing the monitor.

## 2023-12-13 NOTE — Telephone Encounter (Signed)
Scheduled to have monitor applied 12/14/2023, 10:00 AM.

## 2023-12-14 ENCOUNTER — Ambulatory Visit: Payer: Medicaid Other | Attending: Obstetrics and Gynecology

## 2023-12-14 DIAGNOSIS — I480 Paroxysmal atrial fibrillation: Secondary | ICD-10-CM

## 2023-12-17 ENCOUNTER — Telehealth: Payer: Self-pay

## 2023-12-17 NOTE — Telephone Encounter (Signed)
Left message for patient to call back. We need to move his ablation from 2/3 to 2/4.

## 2023-12-19 ENCOUNTER — Other Ambulatory Visit: Payer: Self-pay | Admitting: Student

## 2023-12-20 ENCOUNTER — Other Ambulatory Visit: Payer: Self-pay

## 2023-12-20 NOTE — Telephone Encounter (Signed)
Spoke with the patient and rescheduled his ablation to 2/4.

## 2023-12-20 NOTE — Telephone Encounter (Signed)
Pt returning call to nurse

## 2023-12-24 ENCOUNTER — Other Ambulatory Visit (HOSPITAL_COMMUNITY): Payer: Self-pay

## 2023-12-24 ENCOUNTER — Other Ambulatory Visit: Payer: Self-pay

## 2023-12-24 MED ORDER — ACETAMINOPHEN 500 MG PO TABS
1000.0000 mg | ORAL_TABLET | Freq: Four times a day (QID) | ORAL | 2 refills | Status: DC | PRN
  Filled 2023-12-24: qty 30, 4d supply, fill #0
  Filled 2024-01-18 – 2024-01-19 (×2): qty 30, 4d supply, fill #1

## 2023-12-24 MED ORDER — VORTIOXETINE HBR 20 MG PO TABS
20.0000 mg | ORAL_TABLET | Freq: Every day | ORAL | 0 refills | Status: DC
  Filled 2023-12-24: qty 30, 30d supply, fill #0

## 2023-12-24 NOTE — Telephone Encounter (Signed)
CMA unable to locate where [facility] has refilled tylenol Dose also avail otc Will send to pcp to refill

## 2023-12-24 NOTE — Telephone Encounter (Signed)
Medication sent to pharmacy  

## 2023-12-27 ENCOUNTER — Telehealth: Payer: Self-pay | Admitting: Internal Medicine

## 2023-12-27 NOTE — Telephone Encounter (Signed)
Pt needs a PA for Ozempic

## 2023-12-27 NOTE — Telephone Encounter (Signed)
Pet patient he has new insurance through [facility] and they are requiring him to have a prior authorization for Ozempic.  Pharmaracy is [facility] at [address] [facility] [address]

## 2023-12-28 ENCOUNTER — Other Ambulatory Visit (HOSPITAL_COMMUNITY): Payer: Self-pay

## 2023-12-28 ENCOUNTER — Telehealth: Payer: Self-pay

## 2023-12-28 NOTE — Telephone Encounter (Signed)
 Pharmacy Patient Advocate Encounter   Received notification from Pt Calls Messages that prior authorization for Ozempic  is required/requested.   Insurance verification completed.   The patient is insured through [facility] .   Per test claim: PA required; PA submitted to above mentioned insurance via CoverMyMeds Key/confirmation #/EOC [facility] Status is pending

## 2023-12-31 ENCOUNTER — Encounter: Payer: Medicaid Other | Admitting: Student

## 2024-01-03 NOTE — Telephone Encounter (Signed)
 Pharmacy Patient Advocate Encounter  Received notification from [facility] that Prior Authorization for Ozempic has been APPROVED through 12/27/2024   PA #/Case ID/Reference #: [id number]

## 2024-01-07 ENCOUNTER — Telehealth (HOSPITAL_COMMUNITY): Payer: Self-pay | Admitting: Emergency Medicine

## 2024-01-07 ENCOUNTER — Telehealth (HOSPITAL_COMMUNITY): Payer: Self-pay | Admitting: *Deleted

## 2024-01-07 NOTE — Telephone Encounter (Signed)
 Patient returning call about his coming cardiac imaging study; pt verbalizes understanding of appt date/time, parking situation and where to check in, pre-test NPO status, and verified current allergies; name and call back number provided for further questions should they arise  Chantal Requena RN Navigator Cardiac Imaging [facility] [phone] office [phone] cell  Patient aware to arrive at 9:30 am.

## 2024-01-07 NOTE — Telephone Encounter (Signed)
 Attempted to call patient regarding upcoming cardiac CT appointment. Left message on voicemail with name and callback number Rockwell Alexandria RN Navigator Cardiac Imaging [facility] [phone] Office [phone] Cell

## 2024-01-10 ENCOUNTER — Ambulatory Visit (HOSPITAL_COMMUNITY)
Admission: RE | Admit: 2024-01-10 | Discharge: 2024-01-10 | Disposition: A | Discharge status: Home / Self Care | Payer: Medicaid Other | Source: Ambulatory Visit | Attending: Cardiology | Admitting: Cardiology

## 2024-01-10 DIAGNOSIS — I480 Paroxysmal atrial fibrillation: Secondary | ICD-10-CM | POA: Insufficient documentation

## 2024-01-10 MED ORDER — IOHEXOL 350 MG/ML SOLN
75.0000 mL | Freq: Once | INTRAVENOUS | Status: AC | PRN
  Administered 2024-01-10: 75 mL via INTRAVENOUS

## 2024-01-17 ENCOUNTER — Telehealth: Payer: Self-pay | Admitting: Cardiology

## 2024-01-17 ENCOUNTER — Telehealth: Payer: Self-pay | Admitting: Neurology

## 2024-01-17 NOTE — Telephone Encounter (Signed)
New Message:      Patient wants to know if he need both of the appointments that he has on 01-21-24?

## 2024-01-17 NOTE — Telephone Encounter (Signed)
Pt said LVM  on 12/23 that had a referral from Dr. Geraldo Pitter to schedule for sleep consult. Would like a call back. Pt stated,did not want to be transferred because will leave a vm. Pt informed him nurses have 24 to 48 hours to respond to message. Pt verbalized understand.

## 2024-01-17 NOTE — Telephone Encounter (Signed)
Pt returning call. Requesting call back.  

## 2024-01-17 NOTE — Telephone Encounter (Signed)
Spoke with patient regarding his two appointments on 01/24. Patient did not understand why he needed to see both [name] [name] and Jimmey Ralph. I explained that [name] [name] is general cardiology PA and wanted to see the patient for a 3 month f/u after his last OV and that Jimmey Ralph is an EP doctor and would be giving him instructions for his upcoming ablation. Patient expressed understanding and asked to cancel appointment with [name] [name]. The appointment was canceled.

## 2024-01-18 ENCOUNTER — Other Ambulatory Visit: Payer: Self-pay | Admitting: Student

## 2024-01-19 ENCOUNTER — Telehealth: Payer: Self-pay | Admitting: Cardiology

## 2024-01-19 ENCOUNTER — Other Ambulatory Visit (HOSPITAL_COMMUNITY): Payer: Self-pay

## 2024-01-19 MED ORDER — VORTIOXETINE HBR 20 MG PO TABS
20.0000 mg | ORAL_TABLET | Freq: Every day | ORAL | 0 refills | Status: DC
  Filled 2024-01-19: qty 30, 30d supply, fill #0

## 2024-01-19 MED ORDER — UNIFINE PENTIPS 32G X 4 MM MISC
0 refills | Status: DC
  Filled 2024-01-19: qty 100, fill #0
  Filled 2024-01-22: qty 100, 90d supply, fill #0
  Filled 2024-01-24: qty 100, 30d supply, fill #0

## 2024-01-19 NOTE — Telephone Encounter (Signed)
Pt c/o medication issue:  1. Name of Medication: dronedarone (MULTAQ) 400 MG tablet   2. How are you currently taking this medication (dosage and times per day)?   3. Are you having a reaction (difficulty breathing--STAT)?   4. What is your medication issue? Patient states that a PA is needed for this med.

## 2024-01-20 ENCOUNTER — Telehealth: Payer: Self-pay | Admitting: Pharmacy Technician

## 2024-01-20 ENCOUNTER — Other Ambulatory Visit (HOSPITAL_COMMUNITY): Payer: Self-pay

## 2024-01-20 ENCOUNTER — Other Ambulatory Visit: Payer: Self-pay

## 2024-01-20 NOTE — Telephone Encounter (Signed)
Pharmacy Patient Advocate Encounter   Received notification from Pt Calls Messages that prior authorization for multaq is required/requested.   Insurance verification completed.   The patient is insured through [facility] .   Per test claim: PA required; PA submitted to above mentioned insurance via CoverMyMeds Key/confirmation #/EOC [facility] Status is pending

## 2024-01-20 NOTE — H&P (View-Only) (Signed)
Electrophysiology Office Note:   Date:  01/22/2024  ID:  [name] [name], DOB 04-15-1961, MRN [id number]  Primary Cardiologist: Tonny Bollman, MD Electrophysiologist: Nobie Putnam, MD      History of Present Illness:   [name] [name] is a 63 y.o. male with h/o CAD s/p inferior lateral STEMI 06/2023 with PCI to circumflex and pLAD PCI 11/10/23, paroxysmal atrial fibrillation, hypertension, hyperlipidemia, diabetes, OSA who is being seen today for follow up.  Patient reports that he first had an episode of atrial fibrillation around the time of his heart attack in July 2024.  He did wear a heart monitor after endorsing these palpitations to his cardiologist and was found to have atrial fibrillation, but he only was able to wear the monitor for 3 days.His primary symptoms are palpitations, feeling his heart racing and skipping, which usually occur in the evening.  They were occurring almost daily but since he has started beta-blocker they have decreased in frequency to 3-4 times in the past 2 weeks.  Episodes have lasted as long as 30 minutes to 3 hours in duration.  Sometimes episodes are accompanied by lightheadedness and feeling flushed.  He is otherwise doing relatively well and has no new or acute complaints today.   Discussed the use of AI scribe software for clinical note transcription with the patient, who gave verbal consent to proceed.  History of Present Illness   The patient, with a history of atrial fibrillation (AFib), presents for a follow-up visit after wearing a heart monitor. They report experiencing episodes of heart racing or fluttering, with one significant episode on the 23rd and 24th of the December where they felt extreme fatigue to the point of near fainting. The patient describes this level of exhaustion as unusual and more severe than their usual tiredness associated with sleep apnea. Unfortunately, not wearing monitor during that time. He has been taking dronedarone (Multaq),  which they have been taking for their AFib. They express concern about potential side effects of the medication, particularly increased fatigue. The patient reports that they have run out of this medication and are awaiting preauthorization for a refill. The patient is scheduled for an ablation procedure in the near future, which he wants to keep, and is also due for a sleep study to further investigate their ongoing tiredness.     Review of systems complete and found to be negative unless listed in HPI.   EP Information / Studies Reviewed:    EKG is not ordered today. EKG from 11/12/23 reviewed which showed sinus rhythm.      Echo 07/26/23:  Low normal LV systolic function.  LVEF 50 to 55%.  Mild concentric LVH.  Grade 1 diastolic dysfunction. Normal RV size and function. No significant valvular disease. Left and right atrium are normal in size.   Zio Monitor 08/2023 Worn for only 3 days. 5 total AF episodes.   Risk Assessment/Calculations:    CHA2DS2-VASc Score = 3   This indicates a 3.2% annual risk of stroke. The patient's score is based upon: CHF History: 0 HTN History: 1 Diabetes History: 1 Stroke History: 0 Vascular Disease History: 1 Age Score: 0 Gender Score: 0             Physical Exam:   VS:  BP 122/78   Pulse 75   Ht 5\' 10"  (1.778 m)   Wt 211 lb 12.8 oz (96.1 kg)   SpO2 96%   BMI 30.39 kg/m    Wt Readings from Last 3 Encounters:  01/21/24 211 lb 12.8 oz (96.1 kg)  12/07/23 211 lb 9.6 oz (96 kg)  11/10/23 199 lb (90.3 kg)     GEN: Well nourished, well developed in no acute distress NECK: No JVD. CARDIAC: Normal rate and regular rhythm. RESPIRATORY:  Clear to auscultation without rales, wheezing or rhonchi  ABDOMEN: Soft, non-tender, non-distended EXTREMITIES:  No edema; No deformity   ASSESSMENT AND PLAN:   [name] [name] is a 63 y.o. male with h/o CAD s/p inferior lateral STEMI 06/2023 with PCI to circumflex and pLAD PCI 11/10/23, paroxysmal atrial  fibrillation, hypertension, hyperlipidemia, diabetes, OSA  seen today for evaluation of his atrial fibrillation at the request of Tonny Bollman, MD.   #. Paroxysmal atrial fibrillation, symptomatic: He had documented episodes of atrial fibrillation on his initial Zio monitor which he only wore for 3 days.  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  He has had continued palpitations accompanied by fatigue in the interim. -Continue carvedilol.  Stop Multaq. -Discussed treatment options today for AF including antiarrhythmic drug therapy and ablation. Discussed risks, recovery and likelihood of success with each treatment strategy. Risk, benefits, and alternatives to EP study and ablation for afib were discussed. These risks include but are not limited to stroke, bleeding, vascular damage, tamponade, perforation, damage to the esophagus, lungs, phrenic nerve and other structures, pulmonary vein stenosis, worsening renal function, coronary vasospasm and death.  Discussed potential need for repeat ablation procedures and antiarrhythmic drugs after an initial ablation. The patient understands these risk and wishes to proceed.  We will therefore proceed with catheter ablation for atrial fibrillation and EP study on February 4.   #. Secondary hypercoagulable state due to atrial fibrillation:  -CHA2DS2-VASc score of 3. -Continue Eliquis.   #. CAD: Inferior lateral STEMI 06/2023 with PCI to circumflex and pLAD PCI 11/10/23 -No chest pain currently. -Continue Plavix, statin, beta-blocker.   #. Hypertension -At goal today.  Recommend checking blood pressures 1-2 times per week at home and recording the values.  Recommend bringing these recordings to the primary care physician.   #. OSA: -He is currently awaiting sleep study.  Encouraged him to go forward with sleep study.  Signed, Nobie Putnam, MD

## 2024-01-20 NOTE — Progress Notes (Unsigned)
Electrophysiology Office Note:   Date:  01/20/2024  ID:  [name] [name], DOB 16-Mar-1961, MRN [id number]  Primary Cardiologist: Tonny Bollman, MD Electrophysiologist: Nobie Putnam, MD  {Click to update primary MD,subspecialty MD or APP then REFRESH:1}    History of Present Illness:   [name] [name] is a 63 y.o. male with h/o CAD s/p inferior lateral STEMI 06/2023 with PCI to circumflex and pLAD PCI 11/10/23, paroxysmal atrial fibrillation, hypertension, hyperlipidemia, diabetes, OSA who is being seen today for follow up.  Patient reports that he first had an episode of atrial fibrillation around the time of his heart attack in July 2024.  He did wear a heart monitor after endorsing these palpitations to his cardiologist and was found to have atrial fibrillation, but he only was able to wear the monitor for 3 days.His primary symptoms are palpitations, feeling his heart racing and skipping, which usually occur in the evening.  They were occurring almost daily but since he has started beta-blocker they have decreased in frequency to 3-4 times in the past 2 weeks.  Episodes have lasted as long as 30 minutes to 3 hours in duration.  Sometimes episodes are accompanied by lightheadedness and feeling flushed.  He is otherwise doing relatively well and has no new or acute complaints today.   Discussed the use of AI scribe software for clinical note transcription with the patient, who gave verbal consent to proceed.  History of Present Illness            Review of systems complete and found to be negative unless listed in HPI.   EP Information / Studies Reviewed:    EKG is not ordered today. EKG from 11/12/23 reviewed which showed sinus rhythm.      Echo 07/26/23:  Low normal LV systolic function.  LVEF 50 to 55%.  Mild concentric LVH.  Grade 1 diastolic dysfunction. Normal RV size and function. No significant valvular disease. Left and right atrium are normal in size.   Zio Monitor  08/2023 Worn for only 3 days. 5 total AF episodes.   Risk Assessment/Calculations:    CHA2DS2-VASc Score = 3  {Confirm score is correct.  If not, click here to update score.  REFRESH note.  :1} This indicates a 3.2% annual risk of stroke. The patient's score is based upon: CHF History: 0 HTN History: 1 Diabetes History: 1 Stroke History: 0 Vascular Disease History: 1 Age Score: 0 Gender Score: 0   {This patient has a significant risk of stroke if diagnosed with atrial fibrillation.  Please consider VKA or DOAC agent for anticoagulation if the bleeding risk is acceptable.   You can also use the SmartPhrase .[email or web address] for documentation.   :[id number]} No BP recorded.  {Refresh Note OR Click here to enter BP  :1}***        Physical Exam:   VS:  There were no vitals taken for this visit.   Wt Readings from Last 3 Encounters:  12/07/23 211 lb 9.6 oz (96 kg)  11/10/23 199 lb (90.3 kg)  11/08/23 209 lb (94.8 kg)     GEN: Well nourished, well developed in no acute distress NECK: No JVD. CARDIAC: {EPRHYTHM:28826}, no murmurs, rubs, gallops RESPIRATORY:  Clear to auscultation without rales, wheezing or rhonchi  ABDOMEN: Soft, non-tender, non-distended EXTREMITIES:  No edema; No deformity   ASSESSMENT AND PLAN:   [name] [name] is a 63 y.o. male with h/o CAD s/p inferior lateral STEMI 06/2023 with PCI to circumflex and pLAD PCI 11/10/23, paroxysmal atrial  fibrillation, hypertension, hyperlipidemia, diabetes, OSA  seen today for evaluation of his atrial fibrillation at the request of Tonny Bollman, MD.   #. Paroxysmal atrial fibrillation, symptomatic:  -Continue carvedilol. -He only wore his Zio monitor for 3 days.  We will repeat Zio and he will try to wear for 2 weeks so that we can fully understand his AF burden and confirm. -Discussed treatment options today for AF including antiarrhythmic drug therapy and ablation. Discussed risks, recovery and likelihood of success with each  treatment strategy. Risk, benefits, and alternatives to EP study and ablation for afib were discussed. These risks include but are not limited to stroke, bleeding, vascular damage, tamponade, perforation, damage to the esophagus, lungs, phrenic nerve and other structures, pulmonary vein stenosis, worsening renal function, coronary vasospasm and death.  Discussed potential need for repeat ablation procedures and antiarrhythmic drugs after an initial ablation. The patient understands these risk and wishes to proceed.  We will therefore proceed with catheter ablation at the next available time.  Carto, ICE, anesthesia are requested for the procedure.  Will also obtain CT PV protocol prior to the procedure to exclude LAA thrombus and further evaluate atrial anatomy. -After he completes his Zio monitor he will start Multaq as a bridge to ablation.   #. Secondary hypercoagulable state due to atrial fibrillation:  -CHA2DS2-VASc score of 3. -Continue Eliquis.   #. CAD: Inferior lateral STEMI 06/2023 with PCI to circumflex and pLAD PCI 11/10/23 -No chest pain currently. -Continue Plavix, statin, beta-blocker.   #. Hypertension -At goal today.  Recommend checking blood pressures 1-2 times per week at home and recording the values.  Recommend bringing these recordings to the primary care physician.   #. OSA: -He currently has a referral for another sleep evaluation.  Scheduling is pending.  Encouraged him to go forward with sleep study.  Follow up with {ZOXWR:60454} {EPFOLLOW UJ:81191}  Signed, Nobie Putnam, MD

## 2024-01-21 ENCOUNTER — Encounter: Payer: Self-pay | Admitting: Cardiology

## 2024-01-21 ENCOUNTER — Ambulatory Visit: Payer: Medicaid Other | Admitting: Physician Assistant

## 2024-01-21 ENCOUNTER — Ambulatory Visit: Payer: Medicaid Other | Attending: Cardiology | Admitting: Cardiology

## 2024-01-21 VITALS — BP 122/78 | HR 75 | Ht 70.0 in | Wt 211.8 lb

## 2024-01-21 DIAGNOSIS — I480 Paroxysmal atrial fibrillation: Secondary | ICD-10-CM

## 2024-01-21 DIAGNOSIS — D6869 Other thrombophilia: Secondary | ICD-10-CM

## 2024-01-21 DIAGNOSIS — I2510 Atherosclerotic heart disease of native coronary artery without angina pectoris: Secondary | ICD-10-CM

## 2024-01-21 DIAGNOSIS — I10 Essential (primary) hypertension: Secondary | ICD-10-CM

## 2024-01-21 DIAGNOSIS — G4733 Obstructive sleep apnea (adult) (pediatric): Secondary | ICD-10-CM

## 2024-01-21 NOTE — Patient Instructions (Addendum)
Medication Instructions:  Your physician has recommended you make the following change in your medication:  1) STOP Multaq (dronederone)  *If you need a refill on your cardiac medications before your next appointment, please call your pharmacy*  Lab Work: TODAY: BMET and CBC  If you have labs (blood work) drawn today and your tests are completely normal, you will receive your results only by: MyChart Message (if you have MyChart) OR A paper copy in the mail If you have any lab test that is abnormal or we need to change your treatment, we will call you to review the results.   Testing/Procedures: Ablation - as scheduled on 2/4 Your physician has recommended that you have an ablation. Catheter ablation is a medical procedure used to treat some cardiac arrhythmias (irregular heartbeats). During catheter ablation, a long, thin, flexible tube is put into a blood vessel in your groin (upper thigh), or neck. This tube is called an ablation catheter. It is then guided to your heart through the blood vessel. Radio frequency waves destroy small areas of heart tissue where abnormal heartbeats may cause an arrhythmia to start. Please see the instruction sheet given to you today.   Follow-Up: At [facility], you and your health needs are our priority.  As part of our continuing mission to provide you with exceptional heart care, we have created designated Provider Care Teams.  These Care Teams include your primary Cardiologist (physician) and Advanced Practice Providers (APPs -  Physician Assistants and Nurse Practitioners) who all work together to provide you with the care you need, when you need it.   Your next appointment:   We will call you to schedule your follow up appointments

## 2024-01-23 ENCOUNTER — Other Ambulatory Visit (HOSPITAL_COMMUNITY): Payer: Self-pay

## 2024-01-23 NOTE — Pre-Procedure Instructions (Signed)
Spoke with patient regarding procedure 2/4 with anesthesia.  He is aware that he has to hold his ozempic for 7 days before procedure.  He takes on Satrudays, so last dose was 1/25, he knows to not take dose on Saturday 2/1.

## 2024-01-24 ENCOUNTER — Other Ambulatory Visit (HOSPITAL_COMMUNITY): Payer: Self-pay

## 2024-01-24 ENCOUNTER — Other Ambulatory Visit: Payer: Self-pay

## 2024-01-25 NOTE — Telephone Encounter (Signed)
Pharmacy Patient Advocate Encounter  Received notification from [facility] that Prior Authorization for multaq has been DENIED.  Full denial letter will be uploaded to the media tab. See denial reason below.   PA #/Case ID/Reference #: [id number]

## 2024-01-26 ENCOUNTER — Encounter: Payer: Self-pay | Admitting: Internal Medicine

## 2024-01-26 ENCOUNTER — Ambulatory Visit: Payer: Medicaid Other | Admitting: Internal Medicine

## 2024-01-26 VITALS — BP 124/76 | HR 78 | Ht 70.0 in | Wt 207.0 lb

## 2024-01-26 DIAGNOSIS — E1142 Type 2 diabetes mellitus with diabetic polyneuropathy: Secondary | ICD-10-CM | POA: Diagnosis not present

## 2024-01-26 DIAGNOSIS — R5383 Other fatigue: Secondary | ICD-10-CM | POA: Diagnosis not present

## 2024-01-26 DIAGNOSIS — E1165 Type 2 diabetes mellitus with hyperglycemia: Secondary | ICD-10-CM

## 2024-01-26 DIAGNOSIS — Z794 Long term (current) use of insulin: Secondary | ICD-10-CM | POA: Diagnosis not present

## 2024-01-26 DIAGNOSIS — E1159 Type 2 diabetes mellitus with other circulatory complications: Secondary | ICD-10-CM

## 2024-01-26 LAB — CBC
Hematocrit: 53.2 % — ABNORMAL HIGH (ref 37.5–51.0)
Hemoglobin: 18 g/dL — ABNORMAL HIGH (ref 13.0–17.7)
MCH: 31.5 pg (ref 26.6–33.0)
MCHC: 33.8 g/dL (ref 31.5–35.7)
MCV: 93 fL (ref 79–97)
Platelets: 219 10*3/uL (ref 150–450)
RBC: 5.71 x10E6/uL (ref 4.14–5.80)
RDW: 12.9 % (ref 11.6–15.4)
WBC: 10.2 10*3/uL (ref 3.4–10.8)

## 2024-01-26 LAB — BASIC METABOLIC PANEL
BUN/Creatinine Ratio: 19 (ref 10–24)
BUN: 18 mg/dL (ref 8–27)
CO2: 27 mmol/L (ref 20–29)
Calcium: 9.9 mg/dL (ref 8.6–10.2)
Chloride: 105 mmol/L (ref 96–106)
Creatinine, Ser: 0.94 mg/dL (ref 0.76–1.27)
Glucose: 114 mg/dL — ABNORMAL HIGH (ref 70–99)
Potassium: 4.6 mmol/L (ref 3.5–5.2)
Sodium: 145 mmol/L — ABNORMAL HIGH (ref 134–144)
eGFR: 93 mL/min/{1.73_m2} (ref >59)

## 2024-01-26 LAB — MICROALBUMIN / CREATININE URINE RATIO
Creatinine, Urine: 169 mg/dL (ref 20–320)
Microalb Creat Ratio: 7 mg/g{creat} (ref <30)
Microalb, Ur: 1.1 mg/dL

## 2024-01-26 LAB — POCT GLYCOSYLATED HEMOGLOBIN (HGB A1C): Hemoglobin A1C: 6.1 % — AB (ref 4.0–5.6)

## 2024-01-26 LAB — TSH: TSH: 1.6 m[IU]/L (ref 0.40–4.50)

## 2024-01-26 LAB — T4, FREE: Free T4: 0.9 ng/dL (ref 0.8–1.8)

## 2024-01-26 MED ORDER — EMPAGLIFLOZIN 25 MG PO TABS: 25.0000 mg | ORAL_TABLET | Freq: Every day | ORAL | 3 refills | Status: DC

## 2024-01-26 MED ORDER — HUMALOG KWIKPEN 200 UNIT/ML ~~LOC~~ SOPN: PEN_INJECTOR | SUBCUTANEOUS | 3 refills | Status: DC

## 2024-01-26 MED ORDER — LANTUS SOLOSTAR 100 UNIT/ML ~~LOC~~ SOPN: 60.0000 [IU] | PEN_INJECTOR | Freq: Every day | SUBCUTANEOUS | 3 refills | Status: DC

## 2024-01-26 MED ORDER — INSULIN PEN NEEDLE 32G X 4 MM MISC: 1.0000 | Freq: Four times a day (QID) | 3 refills | Status: AC

## 2024-01-26 MED ORDER — SEMAGLUTIDE (1 MG/DOSE) 4 MG/3ML ~~LOC~~ SOPN: 1.0000 mg | PEN_INJECTOR | SUBCUTANEOUS | 3 refills | Status: DC

## 2024-01-26 NOTE — Patient Instructions (Addendum)
-   Increase Jardiance 25 mg, 1 tablet every morning ,  - Increase Ozempic 1 mg once weekly - Lantus 70 units - Humalog 40 units with each meal    HOW TO TREAT LOW BLOOD SUGARS (Blood sugar LESS THAN 70 MG/DL) Please follow the RULE OF 15 for the treatment of hypoglycemia treatment (when your (blood sugars are less than 70 mg/dL)   STEP 1: Take 15 grams of carbohydrates when your blood sugar is low, which includes:  3-4 GLUCOSE TABS  OR 3-4 OZ OF JUICE OR REGULAR SODA OR ONE TUBE OF GLUCOSE GEL    STEP 2: RECHECK blood sugar in 15 MINUTES STEP 3: If your blood sugar is still low at the 15 minute recheck --> then, go back to STEP 1 and treat AGAIN with another 15 grams of carbohydrates.

## 2024-01-26 NOTE — Progress Notes (Signed)
Name: [name] [name]  Age/ Sex: 63 y.o., male   MRN/ DOB: [id number], March 02, 1961     PCP: Rocky Morel, DO   Reason for Endocrinology Evaluation: Type 2 Diabetes Mellitus  Initial Endocrine Consultative Visit: 11/13/2020    PATIENT IDENTIFIER: [name] [name] is a 63 y.o. male with a past medical history of T2DM, CAD . The patient has followed with Endocrinology clinic since 11/13/2020 for consultative assistance with management of his diabetes.  DIABETIC HISTORY:  [name] [name] was diagnosed with DM in 2008, Metformin- diarrhea , glipizide - hypoglycemia. His hemoglobin A1c has ranged from 8.4% in 2021, peaking at 10.5% in 2020.  Patient was lost to follow-up for approximately 2 years from 2022 until his return to our office by 08/2023  He was started by his PCPs office on basal/prandial insulin by 02/2023 with an A1c of >14.0%  Ozempic started 08/2023  HYPOGONADISM HISTORY: Has been on testosterone for many years,   He has no prior radiation or surgeries on testicles.  He is not on opiates  He asked me to take this over 06/2021   By 10/2021 he asked to switch to topical testosterone.   Testosterone was on hold in 2024 due to MI  SUBJECTIVE:   During the last visit (09/21/2023): A1c 8.2%      Today (01/26/2024): [name] [name]  is here for a follow up on diabetes management and hypogonadism.   Unfortunately he presented to the ED by 06/2023 with ST elevation MI  He is s/p successful PCI with stent placement 10/2023 Patient continues to follow-up with cardiology for CAD and A-fib, he is scheduled for cardioversion 2//2025   He checks glucose 3 times a day , no hypoglycemia  Palpitations have been improving  Denies nausea or vomiting  Has occasional constipation but no diarrhea  Was on Multaq for a month , noted fatigue while on it    HOME ENDOCRINE  REGIMEN:  Jardiance 10 mg daily  Ozempic 0.5 mg weekly Lantus 55 units daily - depends on the sugar 60-80 units   Humalog 20 units TIDQAC- 50 units     Statin: yes ACE-I/ARB: yes Prior Diabetic Education: yes   METER DOWNLOAD SUMMARY: Unable to download  90 day average 150 mg/dL  782- 956 mg/dL     DIABETIC COMPLICATIONS: Microvascular complications:   Denies: CKD, retinopathy, neuropathy Last Eye Exam: Completed 01/2021  Macrovascular complications:  CAD Denies:  CVA, PVD   HISTORY:  Past Medical History:  Past Medical History:  Diagnosis Date   Anxiety    Coronary artery disease    Depression    Diabetes mellitus    Type 2   Enlarged prostate    Environmental allergies    Environmental and seasonal allergies    GERD (gastroesophageal reflux disease)    Headache(784.0)    sinus and migraines   Hyperlipidemia    Hypertension    IBS (irritable bowel syndrome)    Low testosterone    S/P angioplasty with stent to distal LCX and mid LCX both DES 07/25/23 07/26/2023   Sleep apnea    has used BiPAP in past; last sleep study >10 years   Past Surgical History:  Past Surgical History:  Procedure Laterality Date   COLONOSCOPY  09/2018   normal, repeat in 10 years per patient.  [place], [place]   CORONARY STENT INTERVENTION N/A 11/10/2023   Procedure: CORONARY STENT INTERVENTION;  Surgeon: Tonny Bollman, MD;  Location: [facility] INVASIVE CV LAB;  Service: Cardiovascular;  Laterality: N/A;   CORONARY ULTRASOUND/IVUS N/A 11/10/2023   Procedure: Coronary Ultrasound/IVUS;  Surgeon: Tonny Bollman, MD;  Location: [facility] INVASIVE CV LAB;  Service: Cardiovascular;  Laterality: N/A;   CORONARY/GRAFT ACUTE MI REVASCULARIZATION N/A 07/25/2023   Procedure: Coronary/Graft Acute MI Revascularization;  Surgeon: Tonny Bollman, MD;  Location[facility] INVASIVE CV LAB;  Service: Cardiovascular;  Laterality: N/A;   KNEE ARTHROSCOPY Left    LEFT HEART CATH AND CORONARY ANGIOGRAPHY N/A 07/25/2023   Procedure: LEFT HEART CATH AND CORONARY ANGIOGRAPHY;  Surgeon: Tonny Bollman, MD;  Location: [facility] INVASIVE CV LAB;   Service: Cardiovascular;  Laterality: N/A;   RENAL ANGIOGRAPHY N/A 07/16/2020   Procedure: RENAL ANGIOGRAPHY;  Surgeon: Yates Decamp, MD;  Location: [facility] INVASIVE CV LAB;  Service: Cardiovascular;  Laterality: N/A;   SACROILIAC JOINT FUSION Right 05/02/2014   Procedure: SACROILIAC JOINT FUSION;  Surgeon: Emilee Hero, MD;  Location: [facility] OR;  Service: Orthopedics;  Laterality: Right;  Right sided sacroiliac joint fusion   TONSILLECTOMY     uvula removed   Social History:  reports that he quit smoking about 31 years ago. His smoking use included cigarettes. He started smoking about 41 years ago. He has a 10 pack-year smoking history. He has never used smokeless tobacco. He reports that he does not drink alcohol and does not use drugs. Family History:  Family History  Problem Relation Age of Onset   Cancer Mother        lung   Cancer Father        prostate, mets to bone, lung   Cancer Brother        melanoma   Heart disease Maternal Grandmother        MI   Lung disease Maternal Grandfather        black lung   Heart disease Paternal Grandmother    Stroke Paternal Grandmother    Heart disease Paternal Grandfather    Heart disease Brother 83       MI   Diabetes Brother      HOME MEDICATIONS: Allergies as of 01/26/2024       Reactions   Pravastatin Other (See Comments)   Joint myalgias   Trazodone And Nefazodone    Had restless legs   Ezetimibe Other (See Comments)   Myalgias, joint pain   Hydrochlorothiazide Other (See Comments)   lightheadedness   Metformin And Related Diarrhea        Medication List        Accurate as of January 26, 2024  8:25 AM. If you have any questions, ask your nurse or doctor.          STOP taking these medications    insulin lispro 100 UNIT/ML KwikPen Commonly known as: HumaLOG KwikPen Replaced by: HumaLOG KwikPen 200 UNIT/ML KwikPen Stopped by: Johnney Ou Jurell Basista   Semaglutide(0.25 or 0.5MG /DOS) 2 MG/3ML Sopn Replaced by:  Semaglutide (1 MG/DOSE) 4 MG/3ML Sopn Stopped by: Johnney Ou Gilliam Hawkes       TAKE these medications    Acetaminophen Extra Strength 500 MG Tabs Take 2 tablets (1,000 mg total) by mouth every 6 (six) hours as needed for moderate pain (pain score 4-6).   apixaban 5 MG Tabs tablet Commonly known as: ELIQUIS Take 1 tablet (5 mg total) by mouth 2 (two) times daily.   atorvastatin 80 MG tablet Commonly known as: LIPITOR Take 1 tablet (80 mg total) by mouth daily.   carvedilol 12.5 MG tablet Commonly known as: COREG Take 1.5 tablets (18.75  mg total) by mouth 2 (two) times daily.   cetirizine 10 MG tablet Commonly known as: ZYRTEC Take 1 tablet (10 mg total) by mouth daily.   clopidogrel 75 MG tablet Commonly known as: Plavix Take 1 tablet (75 mg total) by mouth daily.   empagliflozin 25 MG Tabs tablet Commonly known as: Jardiance Take 1 tablet (25 mg total) by mouth daily before breakfast. What changed:  medication strength how much to take Changed by: Johnney Ou Rossana Molchan   fluticasone 50 MCG/ACT nasal spray Commonly known as: FLONASE Place 1 spray into both nostrils as needed for allergies or rhinitis.   HumaLOG KwikPen 200 UNIT/ML KwikPen Generic drug: insulin lispro Max daily 100 units Replaces: insulin lispro 100 UNIT/ML KwikPen Started by: Johnney Ou Elyse Prevo   Lantus SoloStar 100 UNIT/ML Solostar Pen Generic drug: insulin glargine Inject 60 Units into the skin daily. What changed: how much to take Changed by: Johnney Ou Dilynn Munroe   metoprolol tartrate 25 MG tablet Commonly known as: LOPRESSOR Take 1 tablet (25 mg total) by mouth daily as needed (palpitations).   nitroGLYCERIN 0.4 MG SL tablet Commonly known as: NITROSTAT Place 1 tablet (0.4 mg total) under the tongue every 5 (five) minutes as needed for chest pain.   Repatha SureClick 140 MG/ML Soaj Generic drug: Evolocumab ADMINISTER 1 ML UNDER THE SKIN EVERY 14 DAYS.   rOPINIRole 1 MG  tablet Commonly known as: REQUIP Take 1 tablet (1 mg total) by mouth daily.   Semaglutide (1 MG/DOSE) 4 MG/3ML Sopn Inject 1 mg as directed once a week. Replaces: Semaglutide(0.25 or 0.5MG /DOS) 2 MG/3ML Sopn Started by: Johnney Ou Kenzee Bassin   triamcinolone cream 0.1 % Commonly known as: KENALOG Apply 1 Application topically 2 (two) times daily. What changed:  when to take this reasons to take this   Unifine Pentips 32G X 4 MM Misc Generic drug: Insulin Pen Needle Use with Levemir   Insulin Pen Needle 32G X 4 MM Misc 1 Device by Does not apply route in the morning, at noon, in the evening, and at bedtime.   vortioxetine HBr 20 MG Tabs tablet Commonly known as: Trintellix Take 1 tablet (20 mg total) by mouth daily.         OBJECTIVE:   Vital Signs: BP 124/76 (BP Location: Left Arm, Patient Position: Sitting, Cuff Size: Normal)   Pulse 78   Ht 5\' 10"  (1.778 m)   Wt 207 lb (93.9 kg)   SpO2 97%   BMI 29.70 kg/m   Wt Readings from Last 3 Encounters:  01/26/24 207 lb (93.9 kg)  01/21/24 211 lb 12.8 oz (96.1 kg)  12/07/23 211 lb 9.6 oz (96 kg)     Exam: General: Pt appears well and is in NAD  Lungs: Clear with good BS bilat   Heart: RRR   Abdomen: Soft, nontender  Extremities: No pretibial edema.  Neuro: MS is good with appropriate affect, pt is alert and Ox3    DM Foot Exam 09/21/2023 The skin of the feet is intact without sores or ulcerations. The pedal pulses are 2+ on right and 2+ on left. The sensation is decreased  to a screening 5.07, 10 gram monofilament on the right toes    DATA REVIEWED:  Lab Results  Component Value Date   HGBA1C 6.1 (A) 01/26/2024   HGBA1C 6.3 (H) 11/11/2023   HGBA1C 8.2 (A) 09/21/2023    Latest Reference Range & Units 01/26/24 08:31  TSH 0.40 - 4.50 mIU/L 1.60  T4,Free(Direct) 0.8 -  1.8 ng/dL 0.9    Latest Reference Range & Units 01/26/24 08:31  Microalb, Ur mg/dL 1.1  MICROALB/CREAT RATIO <30 mg/g creat 7   Creatinine, Urine 20 - 320 mg/dL 191      Latest Reference Range & Units 01/25/24 08:17  Sodium 134 - 144 mmol/L 145 (H)  Potassium 3.5 - 5.2 mmol/L 4.6  Chloride 96 - 106 mmol/L 105  CO2 20 - 29 mmol/L 27  Glucose 70 - 99 mg/dL 478 (H)  BUN 8 - 27 mg/dL 18  Creatinine 2.95 - 6.21 mg/dL 3.08  Calcium 8.6 - 65.7 mg/dL 9.9  BUN/Creatinine Ratio 10 - 24  19  eGFR >59 mL/min/1.73 93  (H): Data is abnormally high  ASSESSMENT / PLAN / RECOMMENDATIONS:   1) Type 2 Diabetes Mellitus, Optimally  controlled  , With Neuropathic  and macrovascular complications - Most recent A1c of 6.1 %. Goal A1c < 7.0 %.   -His A1c 6.1%, I am concerned with a low A1c that he is having hypoglycemia, there is no hypoglycemia or glucose meter review -He declines CGM technology -He has been using more insulin than previously prescribed, I did explain to the patient this is why he is gaining weight, as he is questioning weight gain -I will increase Jardiance and Ozempic, I did explain to the patient they should not adjust his Lantus based on bedtime BG readings -I have also advised the patient to decrease Humalog as below   MEDICATIONS: -Increase Jardiance 25 mg daily  -Increase Ozempic 1 mg weekly -Decrease Lantus 60 units daily  -Decrease Humalog 40 units 3 times daily before every meal   EDUCATION / INSTRUCTIONS: BG monitoring instructions: Patient is instructed to check his blood sugars 3 times a day. Call [facility] [place] [facility] if: BG persistently < 70  I reviewed the Rule of 15 for the treatment of hypoglycemia in detail with the patient. Literature supplied.    2) Diabetic complications:  Eye: Does not have known diabetic retinopathy.  Neuro/ Feet: Does have known diabetic peripheral neuropathy based on foot exam 12/15/2021 Renal: Patient does not have known baseline CKD. He   is  on an ACEI/ARB at present.    3) Hypogonadism:   -This has been diagnosed many years ago, unknown  etiology - Due to issues  with injectable testosterone administration he requested  to switch to topical testosterone in 2022 -He has not been on testosterone therapy for over a year, given recent ST elevation MI, will have to postpone this for at least 6 months   Medication N/a   4) CAD/Dyslipidemia:  - Per Cardiology    5) Fatigue :  -Patient was on Multaq and he attributes fatigue during that period of time as well as weight gain  - TFT's are normal  F/U in 4 months    Signed electronically by: Lyndle Herrlich, MD  [facility] End[facility]  [facility] [address] [place] [place], [place] [address] Phone: [phone] FAX: [phone]   CC: Rocky Morel, DO [address] [place] [address] Phone: [phone]  Fax: [phone]  Return to Endocrinology clinic as below: Future Appointments  Date Time Provider Department Center  02/02/2024  3:00 PM Dohmeier, Porfirio Mylar, MD [facility] None  02/10/2024  8:40 AM Tonny Bollman, MD [id number]

## 2024-01-27 ENCOUNTER — Other Ambulatory Visit (HOSPITAL_COMMUNITY): Payer: Self-pay

## 2024-01-28 ENCOUNTER — Encounter: Payer: Self-pay | Admitting: Internal Medicine

## 2024-01-31 ENCOUNTER — Telehealth: Payer: Self-pay

## 2024-01-31 NOTE — Pre-Procedure Instructions (Signed)
 Instructed patient on the following items: Arrival time 515 Nothing to eat or drink after midnight No meds AM of procedure Responsible person to drive you home and stay with you for 24 hrs  Have you missed any doses of anti-coagulant Eliquis- takes twice a day, hasn't missed any doses.  Don't take dose in the morning.

## 2024-01-31 NOTE — Telephone Encounter (Signed)
Pharmacy Patient Advocate Encounter   Received notification from CoverMyMeds that prior authorization for TRINTELLIX is required/requested.   Insurance verification completed.   The patient is insured through [facility] MEDICAID .   PA required; PA submitted to above mentioned insurance via CoverMyMeds Key/confirmation #/EOC [id number]. Status is pending

## 2024-02-01 ENCOUNTER — Encounter (HOSPITAL_COMMUNITY)
Admission: RE | Disposition: A | Discharge status: Home / Self Care | Payer: Self-pay | Source: Home / Self Care | Attending: Cardiology

## 2024-02-01 ENCOUNTER — Other Ambulatory Visit: Payer: Self-pay

## 2024-02-01 ENCOUNTER — Ambulatory Visit (HOSPITAL_COMMUNITY): Payer: Self-pay

## 2024-02-01 ENCOUNTER — Other Ambulatory Visit (HOSPITAL_COMMUNITY): Payer: Self-pay

## 2024-02-01 ENCOUNTER — Encounter (HOSPITAL_COMMUNITY): Payer: Self-pay | Admitting: Cardiology

## 2024-02-01 ENCOUNTER — Ambulatory Visit (HOSPITAL_COMMUNITY)
Admission: RE | Admit: 2024-02-01 | Discharge: 2024-02-01 | Disposition: A | Discharge status: Home / Self Care | Payer: Medicaid Other | Attending: Cardiology | Admitting: Cardiology

## 2024-02-01 ENCOUNTER — Ambulatory Visit (HOSPITAL_BASED_OUTPATIENT_CLINIC_OR_DEPARTMENT_OTHER): Payer: Self-pay

## 2024-02-01 DIAGNOSIS — I480 Paroxysmal atrial fibrillation: Secondary | ICD-10-CM

## 2024-02-01 DIAGNOSIS — I252 Old myocardial infarction: Secondary | ICD-10-CM | POA: Diagnosis not present

## 2024-02-01 DIAGNOSIS — I2510 Atherosclerotic heart disease of native coronary artery without angina pectoris: Secondary | ICD-10-CM | POA: Insufficient documentation

## 2024-02-01 DIAGNOSIS — I10 Essential (primary) hypertension: Secondary | ICD-10-CM

## 2024-02-01 DIAGNOSIS — Z7902 Long term (current) use of antithrombotics/antiplatelets: Secondary | ICD-10-CM | POA: Diagnosis not present

## 2024-02-01 DIAGNOSIS — Z7901 Long term (current) use of anticoagulants: Secondary | ICD-10-CM | POA: Insufficient documentation

## 2024-02-01 DIAGNOSIS — E119 Type 2 diabetes mellitus without complications: Secondary | ICD-10-CM | POA: Diagnosis not present

## 2024-02-01 DIAGNOSIS — D6869 Other thrombophilia: Secondary | ICD-10-CM | POA: Diagnosis not present

## 2024-02-01 DIAGNOSIS — Z79899 Other long term (current) drug therapy: Secondary | ICD-10-CM | POA: Insufficient documentation

## 2024-02-01 DIAGNOSIS — G4733 Obstructive sleep apnea (adult) (pediatric): Secondary | ICD-10-CM | POA: Diagnosis not present

## 2024-02-01 DIAGNOSIS — Z955 Presence of coronary angioplasty implant and graft: Secondary | ICD-10-CM | POA: Insufficient documentation

## 2024-02-01 HISTORY — PX: ATRIAL FIBRILLATION ABLATION: EP1191

## 2024-02-01 LAB — POCT ACTIVATED CLOTTING TIME
Activated Clotting Time: 273 s
Activated Clotting Time: 331 s

## 2024-02-01 LAB — GLUCOSE, CAPILLARY
Glucose-Capillary: 129 mg/dL — ABNORMAL HIGH (ref 70–99)
Glucose-Capillary: 133 mg/dL — ABNORMAL HIGH (ref 70–99)

## 2024-02-01 SURGERY — ATRIAL FIBRILLATION ABLATION
Anesthesia: General

## 2024-02-01 MED ORDER — DEXAMETHASONE SODIUM PHOSPHATE 10 MG/ML IJ SOLN
INTRAMUSCULAR | Status: DC | PRN
  Administered 2024-02-01: 4 mg via INTRAVENOUS

## 2024-02-01 MED ORDER — SODIUM CHLORIDE 0.9% FLUSH: 3.0000 mL | Freq: Two times a day (BID) | INTRAVENOUS | Status: DC

## 2024-02-01 MED ORDER — ROCURONIUM BROMIDE 10 MG/ML (PF) SYRINGE
PREFILLED_SYRINGE | INTRAVENOUS | Status: DC | PRN
  Administered 2024-02-01: 70 mg via INTRAVENOUS
  Administered 2024-02-01: 30 mg via INTRAVENOUS

## 2024-02-01 MED ORDER — PROTAMINE SULFATE 10 MG/ML IV SOLN
INTRAVENOUS | Status: DC | PRN
  Administered 2024-02-01: 35 mg via INTRAVENOUS

## 2024-02-01 MED ORDER — ATROPINE SULFATE 1 MG/10ML IJ SOSY
PREFILLED_SYRINGE | INTRAMUSCULAR | Status: DC | PRN
  Administered 2024-02-01: 1 mg via INTRAVENOUS

## 2024-02-01 MED ORDER — PROPOFOL 10 MG/ML IV BOLUS
INTRAVENOUS | Status: DC | PRN
  Administered 2024-02-01: 150 mg via INTRAVENOUS

## 2024-02-01 MED ORDER — APIXABAN 5 MG PO TABS
5.0000 mg | ORAL_TABLET | Freq: Once | ORAL | Status: AC
  Administered 2024-02-01: 5 mg via ORAL
  Filled 2024-02-01: qty 1

## 2024-02-01 MED ORDER — DEXCOM G7 SENSOR MISC: 3 refills | Status: DC

## 2024-02-01 MED ORDER — SODIUM CHLORIDE 0.9 % IV SOLN: INTRAVENOUS | Status: DC

## 2024-02-01 MED ORDER — ONDANSETRON HCL 4 MG/2ML IJ SOLN: 4.0000 mg | Freq: Four times a day (QID) | INTRAMUSCULAR | Status: DC | PRN

## 2024-02-01 MED ORDER — ONDANSETRON HCL 4 MG/2ML IJ SOLN
INTRAMUSCULAR | Status: DC | PRN
  Administered 2024-02-01: 4 mg via INTRAVENOUS

## 2024-02-01 MED ORDER — PHENYLEPHRINE 80 MCG/ML (10ML) SYRINGE FOR IV PUSH (FOR BLOOD PRESSURE SUPPORT)
PREFILLED_SYRINGE | INTRAVENOUS | Status: DC | PRN
  Administered 2024-02-01: 220 ug via INTRAVENOUS
  Administered 2024-02-01: 80 ug via INTRAVENOUS
  Administered 2024-02-01: 240 ug via INTRAVENOUS
  Administered 2024-02-01 (×2): 160 ug via INTRAVENOUS
  Administered 2024-02-01 (×2): 80 ug via INTRAVENOUS

## 2024-02-01 MED ORDER — SUGAMMADEX SODIUM 200 MG/2ML IV SOLN
INTRAVENOUS | Status: DC | PRN
  Administered 2024-02-01: 200 mg via INTRAVENOUS

## 2024-02-01 MED ORDER — SODIUM CHLORIDE 0.9 % IV SOLN: 250.0000 mL | INTRAVENOUS | Status: DC | PRN

## 2024-02-01 MED ORDER — SODIUM CHLORIDE 0.9% FLUSH: 3.0000 mL | INTRAVENOUS | Status: DC | PRN

## 2024-02-01 MED ORDER — HEPARIN SODIUM (PORCINE) 1000 UNIT/ML IJ SOLN
INTRAMUSCULAR | Status: DC | PRN
  Administered 2024-02-01: 1000 [IU] via INTRAVENOUS
  Administered 2024-02-01: 15000 [IU] via INTRAVENOUS

## 2024-02-01 MED ORDER — HEPARIN (PORCINE) IN NACL 1000-0.9 UT/500ML-% IV SOLN
INTRAVENOUS | Status: DC | PRN
  Administered 2024-02-01 (×3): 500 mL

## 2024-02-01 MED ORDER — ACETAMINOPHEN 325 MG PO TABS
650.0000 mg | ORAL_TABLET | ORAL | Status: DC | PRN
Start: 2024-02-01 — End: 2024-02-01
  Administered 2024-02-01: 650 mg via ORAL
  Filled 2024-02-01: qty 2

## 2024-02-01 SURGICAL SUPPLY — 22 items
BAG SNAP BAND KOVER 36X36 (MISCELLANEOUS) IMPLANT
BLANKET WARM UNDERBOD FULL ACC (MISCELLANEOUS) IMPLANT
CABLE PFA RX CATH CONN (CABLE) IMPLANT
CATH 8FR REPROCESSED SOUNDSTAR (CATHETERS) ×1 IMPLANT
CATH 8FR SOUNDSTAR REPROCESSED (CATHETERS) IMPLANT
CATH FARAWAVE ABLATION 31 (CATHETERS) IMPLANT
CATH OCTARAY 2.0 F 3-3-3-3-3 (CATHETERS) IMPLANT
CATH WEB BI DIR CSDF CRV REPRO (CATHETERS) IMPLANT
CLOSURE PERCLOSE PROSTYLE (VASCULAR PRODUCTS) IMPLANT
COVER SWIFTLINK CONNECTOR (BAG) ×2 IMPLANT
DEVICE CLOSURE MYNXGRIP 6/7F (Vascular Products) IMPLANT
DILATOR VESSEL 38 20CM 16FR (INTRODUCER) IMPLANT
GUIDEWIRE INQWIRE 1.5J.035X260 (WIRE) IMPLANT
INQWIRE 1.5J .035X260CM (WIRE) ×1 IMPLANT
KIT VERSACROSS CNCT FARADRIVE (KITS) IMPLANT
PACK EP LF (CUSTOM PROCEDURE TRAY) ×2 IMPLANT
PAD DEFIB RADIO PHYSIO CONN (PAD) ×2 IMPLANT
PATCH CARTO3 (PAD) IMPLANT
SHEATH FARADRIVE STEERABLE (SHEATH) IMPLANT
SHEATH PINNACLE 8F 10CM (SHEATH) IMPLANT
SHEATH PINNACLE 9F 10CM (SHEATH) IMPLANT
SHEATH PROBE COVER 6X72 (BAG) IMPLANT

## 2024-02-01 NOTE — Anesthesia Procedure Notes (Signed)
 Procedure Name: Intubation Date/Time: 02/01/2024 7:47 AM  Performed by: Lockie Flesher, CRNAPre-anesthesia Checklist: Patient identified, Emergency Drugs available, Suction available and Patient being monitored Patient Re-evaluated:Patient Re-evaluated prior to induction Oxygen Delivery Method: Circle System Utilized Preoxygenation: Pre-oxygenation with 100% oxygen Induction Type: IV induction Ventilation: Mask ventilation without difficulty Laryngoscope Size: Mac and 3 Grade View: Grade I Tube type: Oral Tube size: 7.5 mm Number of attempts: 1 Airway Equipment and Method: Stylet and Oral airway Placement Confirmation: ETT inserted through vocal cords under direct vision, positive ETCO2 and breath sounds checked- equal and bilateral Secured at: 23 cm Tube secured with: Tape Dental Injury: Teeth and Oropharynx as per pre-operative assessment

## 2024-02-01 NOTE — Telephone Encounter (Signed)
Pharmacy Patient Advocate Encounter  Received notification from [facility] MEDICAID that Prior Authorization for TRINTELLIX has been APPROVED from 01/31/24 to 01/30/25   PA #/Case ID/Reference #: [id number]

## 2024-02-01 NOTE — Progress Notes (Signed)
Pt did not want to wait for Dr Jimmey Ralph to see him post procedure, Dr Lalla Brothers talked to the pt post so he could leave

## 2024-02-01 NOTE — Interval H&P Note (Signed)
 History and Physical Interval Note:  02/01/2024 7:18 AM  [name] [name]  has presented today for surgery, with the diagnosis of symptomatic paroxysmal atrial fibrillation.  The various methods of treatment have been discussed with the patient and family. After consideration of risks, benefits and other options for treatment, the patient has consented to  Procedure(s): ATRIAL FIBRILLATION ABLATION (N/A) as a surgical intervention.  The patient's history has been reviewed, patient examined, no change in status, stable for surgery.  I have reviewed the patient's chart and labs.  Questions were answered to the patient's satisfaction.     Fonda Kitty

## 2024-02-01 NOTE — Transfer of Care (Signed)
 Immediate Anesthesia Transfer of Care Note  Patient: [name] [name]  Procedure(s) Performed: ATRIAL FIBRILLATION ABLATION  Patient Location: Cath Lab  Anesthesia Type:General  Level of Consciousness: awake, alert , and oriented  Airway & Oxygen Therapy: Patient Spontanous Breathing and Patient connected to nasal cannula oxygen  Post-op Assessment: Report given to RN and Post -op Vital signs reviewed and stable  Post vital signs: Reviewed and stable  Last Vitals:  Vitals Value Taken Time  BP    Temp 37 C 02/01/24 0947  Pulse 94 02/01/24 0956  Resp 20 02/01/24 0956  SpO2 95 % 02/01/24 0956  Vitals shown include unfiled device data.  Last Pain:  Vitals:   02/01/24 0947  TempSrc: Temporal  PainSc: 0-No pain         Complications: No notable events documented.

## 2024-02-01 NOTE — Anesthesia Postprocedure Evaluation (Signed)
 Anesthesia Post Note  Patient: [name] [name]  Procedure(s) Performed: ATRIAL FIBRILLATION ABLATION     Patient location during evaluation: PACU Anesthesia Type: General Level of consciousness: awake and alert Pain management: pain level controlled Vital Signs Assessment: post-procedure vital signs reviewed and stable Respiratory status: spontaneous breathing, nonlabored ventilation and respiratory function stable Cardiovascular status: blood pressure returned to baseline and stable Postop Assessment: no apparent nausea or vomiting Anesthetic complications: no   No notable events documented.  Last Vitals:  Vitals:   02/01/24 1016 02/01/24 1030  BP: 121/81 128/83  Pulse: 88 88  Resp: 11 11  Temp: 37.7 C   SpO2: 96% 95%    Last Pain:  Vitals:   02/01/24 1051  TempSrc:   PainSc: 0-No pain   Pain Goal:                   [name] [name]

## 2024-02-01 NOTE — Anesthesia Preprocedure Evaluation (Signed)
Anesthesia Evaluation  Patient identified by MRN, date of birth, ID band Patient awake    Reviewed: Allergy & Precautions, H&P , NPO status , Patient's Chart, lab work & pertinent test results, reviewed documented beta blocker date and time   Airway Mallampati: II  TM Distance: >3 FB Neck ROM: full    Dental no notable dental hx.    Pulmonary sleep apnea and Continuous Positive Airway Pressure Ventilation , former smoker   Pulmonary exam normal breath sounds clear to auscultation       Cardiovascular hypertension, On Medications + CAD, + Past MI and + Cardiac Stents  Normal cardiovascular exam+ dysrhythmias Atrial Fibrillation  Rhythm:regular Rate:Normal     Neuro/Psych  Headaches PSYCHIATRIC DISORDERS Anxiety Depression     Neuromuscular disease    GI/Hepatic Neg liver ROS,GERD  ,,  Endo/Other  diabetes, Type 2    Renal/GU negative Renal ROS  negative genitourinary   Musculoskeletal  (+) Arthritis , Osteoarthritis,    Abdominal   Peds  Hematology negative hematology ROS (+)   Anesthesia Other Findings See surgeon's H&P   Reproductive/Obstetrics negative OB ROS                             Anesthesia Physical Anesthesia Plan  ASA: III  Anesthesia Plan: General   Post-op Pain Management: Minimal or no pain anticipated   Induction: Intravenous  PONV Risk Score and Plan: 2 and Ondansetron, Midazolam and Treatment may vary due to age or medical condition  Airway Management Planned: Oral ETT  Additional Equipment:   Intra-op Plan:   Post-operative Plan: Extubation in OR  Informed Consent: I have reviewed the patients History and Physical, chart, labs and discussed the procedure including the risks, benefits and alternatives for the proposed anesthesia with the patient or authorized representative who has indicated his/her understanding and acceptance.     Dental Advisory  Given  Plan Discussed with: CRNA and Surgeon  Anesthesia Plan Comments:         Anesthesia Quick Evaluation

## 2024-02-01 NOTE — Discharge Instructions (Signed)

## 2024-02-02 ENCOUNTER — Telehealth (HOSPITAL_COMMUNITY): Payer: Self-pay

## 2024-02-02 ENCOUNTER — Institutional Professional Consult (permissible substitution): Payer: Medicaid Other | Admitting: Neurology

## 2024-02-02 ENCOUNTER — Encounter: Payer: Self-pay | Admitting: Neurology

## 2024-02-02 NOTE — Telephone Encounter (Signed)
 Spoke with patient to complete post procedure follow up call.  Patient reports no complications with groin site.   Instructions reviewed with patient:  Remove large bandage at puncture site after 24 hours. It is normal to have bruising, tenderness and a pea or marble sized lump/knot at the groin site which can take up to three months to resolve.  Get help right away if you notice sudden swelling at the puncture site.  Check your puncture site every day for signs of infection: fever, redness, swelling, pus drainage, warmth, foul odor or excessive pain. If this occurs, please call the office at [phone], to speak with the nurse. Get help right away if your puncture site is bleeding and the bleeding does not stop after applying firm pressure to the area.  You may continue to have skipped beats/ atrial fibrillation during the first several months after your procedure.  It is very important not to miss any doses of your blood thinner ELIQUIS . Patient restarted taking medication on yesterday.   You will follow up with the Afib clinic on 02/29/2024 and with the APP on 05/02/2024 after your procedure.   Patient verbalized understanding to all instructions provided.

## 2024-02-04 ENCOUNTER — Encounter: Payer: Self-pay | Admitting: Neurology

## 2024-02-04 ENCOUNTER — Ambulatory Visit (INDEPENDENT_AMBULATORY_CARE_PROVIDER_SITE_OTHER): Payer: Medicaid Other | Admitting: Neurology

## 2024-02-04 VITALS — BP 127/77 | HR 79 | Ht 70.0 in | Wt 212.0 lb

## 2024-02-04 DIAGNOSIS — G4719 Other hypersomnia: Secondary | ICD-10-CM | POA: Insufficient documentation

## 2024-02-04 DIAGNOSIS — Z9889 Other specified postprocedural states: Secondary | ICD-10-CM | POA: Diagnosis not present

## 2024-02-04 DIAGNOSIS — M2629 Other anomalies of dental arch relationship: Secondary | ICD-10-CM

## 2024-02-04 DIAGNOSIS — E11628 Type 2 diabetes mellitus with other skin complications: Secondary | ICD-10-CM | POA: Diagnosis not present

## 2024-02-04 DIAGNOSIS — Z794 Long term (current) use of insulin: Secondary | ICD-10-CM

## 2024-02-04 DIAGNOSIS — J342 Deviated nasal septum: Secondary | ICD-10-CM

## 2024-02-04 DIAGNOSIS — R519 Headache, unspecified: Secondary | ICD-10-CM | POA: Diagnosis not present

## 2024-02-04 DIAGNOSIS — I25810 Atherosclerosis of coronary artery bypass graft(s) without angina pectoris: Secondary | ICD-10-CM

## 2024-02-04 DIAGNOSIS — J322 Chronic ethmoidal sinusitis: Secondary | ICD-10-CM

## 2024-02-04 NOTE — Progress Notes (Signed)
 SLEEP MEDICINE CLINIC    Provider:  Dedra Gores, MD  Primary Care Physician:  [name] Pac, DO [address] [place] [address]     Referring Provider: Trudy Mliss Dragon, Md [address] [place],  [place] [address]          Chief Complaint according to patient   Patient presents with:     New Sleep Patient (Initial Visit)           HISTORY OF PRESENT ILLNESS:  [name] [name] is a 63 y.o. male patient who is seen upon  Dr [name], referral on 02/04/2024 for a Sleep Medicine consultation.   Chief concern according to patient :  I had a heart attack in July and that cause me to change my lifestyle, my diet.    I have the pleasure of seeing [name] [name] 02/04/24 a right -handed male with a possible sleep disorder.    The patient had the first sleep study in the year 2020 at [place] [facility] - AHI 14.3/h and he had already been treated on  BiPAP-  all hypopneas- not apneas-  mouth breather ,  an oxygen saturation Nadir at SP02 86%.   Total hypoxia time 25 minutes     Sleep relevant medical history: UPPP/ Tonsillectomy, sinuitis,  congested, nasal congestion.  Nocturia/ Enuresis  1-2 , as investigated for  Parasomnia , had a Tonsillectomy,   repair/ UPPP? ENT cpnsult showed deviated septum. Chief complaint according to patient : I am soo sleepy, so very tired, HYPERSOMNIA . Sleep and medical history: [name] [name] reports having been tested at  [facility] and later at [facility] (over 5 years ago ) - he remembers being diagnosed with 118 apneas per hour, and that he had his first sleep study taken in a seated position due to orthopnea. He started off on his CPAP could not tolerated was changed to a BiPAP and still could not tolerate it.  His positive airway pressure intolerance was closely linked to a condition of chronic sinusitis, sinus congestion and a known seasonal pattern. He underwent a tonsillectomy but his sinus headaches  responded, his nasal airflow was still restricted.  At the allergy  and asthma center it was documented that he had a tonsillectomy and uvuloplasty, but he continues to have very poor quality sleep, that he is using fluticasone  nasal spray and that he tested positive for mold and certain tree allergens.  His obstructive sleep apnea syndrome has been untreated for many years now.  He also has sacroiliitis, radiculopathy which was not further specified degeneration of the lumbar or lumbosacral intervertebral discs were documented. DM2, anxiety.     Family medical /sleep history: no  other family member on CPAP with OSA, insomnia, sleep walkers.    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   The patient currently works in shifts( chief technology officer,) Tobacco use- former .  ETOH use former ,  No Caffeine intake . Exercise in form of walking. Too fatigued .  [name] [name]      Sleep habits are as follows:  works until 8 Pm some days and starts as early as 7 AM working-  The patient's dinner time is between  4-10 PM. The patient goes to bed at  the latest at 11 PM and continues to sleep for  6  hours, wakes for 1-2 bathroom breaks,  The preferred sleep position is prone , with the support of 1 pillow.  Flat bed.  Dreams are reportedly rather frequent/vivid.   The patient wakes up with an alarm. Variable - 6-9  AM is the usual rise time. Usually could sleep to 12 noon if no alaram.  He/ reports not feeling refreshed or restored in AM, with symptoms such as dry mouth, every day reporting  morning headaches,  and residual fatigue. Naps are taken infrequently, lasting from 2-3 hours  and are not interfering with  nocturnal sleep.    [name] is a 63 y.o. Caucasian male patient who was seen here on 05-11-2019 upon referral from Dr. Orlan [name] (Allergy  and Asthma) for a sleep apnea re- evaluation in the setting of excessive daytime sleepiness. The patient  is interested in an Carlton procedure. [name] [name] reports having been tested at [facility] and later at [facility] center (over 5 years ago) - he remembers being diagnosed with 118 apneas per hour (?), and that he had his first sleep study taken in a seated position due to orthopnea.  He started off on his CPAP, but could not tolerate it, and was changed to a BiPAP modality. He still could not tolerate BiPAP.  His positive airway pressure intolerance was closely linked to a condition of chronic sinusitis, sinus congestion with a known seasonal pattern. He underwent a tonsillectomy to which his sinus headaches responded, but his nasal airflow was still restricted.  At the allergy  and asthma center it was documented that he had a tonsillectomy and uvulo-plasty, but he continues to have very poor quality sleep, that he is using fluticasone  nasal spray and that he tested positive for mold and certain tree allergens.   His obstructive sleep apnea syndrome has been untreated for many years now.   He also has sacroiliitis, radiculopathy which was not further specified degeneration of the lumbar or lumbosacral intervertebral discs were documented. DM2, anxiety, Bruxism.  His HST from June 2020 was deemed non valid,   The patient endorsed the Epworth Sleepiness Scale at 15 points.   The patient's weight 227 pounds with a height of 70 (inches), resulting in a BMI of 32.5 kg/m2. The patient's neck circumference measured 18 inches.     Review of Systems: Out of a complete 14 system review, the patient complains of only the following symptoms, and all other reviewed systems are negative.:  Fatigue, sleepiness , snoring, fragmented sleep, HYPERSOMNIA, variable  work hours, heart attack and fatigue -  DM   How likely are you to doze in the following situations: 0 = not likely, 1 = slight chance, 2 = moderate chance, 3 = high chance   Sitting and Reading? Watching  Television? Sitting inactive in a public place (theater or meeting)? As a passenger in a car for an hour without a break? Lying down in the afternoon when circumstances permit? Sitting and talking to someone? Sitting quietly after lunch without alcohol? In a car, while stopped for a few minutes in traffic?   Total = 19/ 24 points   FSS endorsed at 63/ 63 points.   13/ 15 on GDS (!!)   Social History   Socioeconomic History   Marital status: Single    Spouse name: Not on file   Number of children: Not on file   Years of education: Not on file   Highest education level: Not on file  Occupational History   Not on file  Tobacco Use   Smoking status: Former    Current packs/day: 0.00    Average packs/day: 1 pack/day for 10.0 years (10.0 ttl pk-yrs)    Types: Cigarettes    Start date: 04/20/1982    Quit date: 04/20/1992    Years since quitting: 31.8   Smokeless tobacco: Never  Vaping Use   Vaping status: Never Used  Substance and Sexual Activity   Alcohol use: No   Drug use: No   Sexual activity: Not on file  Other Topics Concern   Not on file  Social History Narrative   Not on file   Social Drivers of Health   Financial Resource Strain: Not on file  Food Insecurity: No Food Insecurity (11/10/2023)   Hunger Vital Sign    Worried About Running Out of Food in the Last Year: Never true    Ran Out of Food in the Last Year: Never true  Transportation Needs: No Transportation Needs (11/10/2023)   PRAPARE - Administrator, Civil Service (Medical): No    Lack of Transportation (Non-Medical): No  Physical Activity: Unknown (03/20/2019)   Exercise Vital Sign    Days of Exercise per Week: 0 days    Minutes of Exercise per Session: Not on file  Stress: Not on file  Social Connections: Unknown (05/11/2022)   [facility]   Social Network    Social Network: Not on file    Family History  Problem Relation Age of Onset   Cancer Mother         lung   Cancer Father        prostate, mets to bone, lung   Cancer Brother        melanoma   Heart disease Maternal Grandmother        MI   Lung disease Maternal Grandfather        black lung   Heart disease Paternal Grandmother    Stroke Paternal Grandmother    Heart disease Paternal Grandfather    Heart disease Brother 22       MI   Diabetes Brother     Past Medical History:  Diagnosis Date   Anxiety    Coronary artery disease    Depression    Diabetes mellitus    Type 2   Enlarged prostate    Environmental allergies    Environmental and seasonal allergies    GERD (gastroesophageal reflux disease)    Headache(784.0)    sinus and migraines   Hyperlipidemia    Hypertension    IBS (irritable bowel syndrome)    Low testosterone     S/P angioplasty with stent to distal LCX and mid LCX both DES 07/25/23 07/26/2023   Sleep apnea    has used BiPAP in past; last sleep study >10 years    Past Surgical History:  Procedure Laterality Date   ATRIAL FIBRILLATION ABLATION N/A 02/01/2024   Procedure: ATRIAL FIBRILLATION ABLATION;  Surgeon: Kennyth Chew, MD;  Location: [facility] INVASIVE CV LAB;  Service: Cardiovascular;  Laterality: N/A;   COLONOSCOPY  09/2018   normal, repeat in 10 years per patient.  [place], [place]   CORONARY STENT INTERVENTION N/A 11/10/2023   Procedure: CORONARY STENT INTERVENTION;  Surgeon: Wonda Sharper, MD;  Location: [facility] INVASIVE CV LAB;  Service: Cardiovascular;  Laterality: N/A;   CORONARY ULTRASOUND/IVUS N/A 11/10/2023   Procedure: Coronary Ultrasound/IVUS;  Surgeon: Wonda Sharper, MD;  Location: [facility] INVASIVE CV LAB;  Service: Cardiovascular;  Laterality: N/A;  CORONARY/GRAFT ACUTE MI REVASCULARIZATION N/A 07/25/2023   Procedure: Coronary/Graft Acute MI Revascularization;  Surgeon: Wonda Sharper, MD;  Location: [facility] INVASIVE CV LAB;  Service: Cardiovascular;  Laterality: N/A;   KNEE ARTHROSCOPY Left    LEFT HEART CATH AND CORONARY ANGIOGRAPHY N/A  07/25/2023   Procedure: LEFT HEART CATH AND CORONARY ANGIOGRAPHY;  Surgeon: Wonda Sharper, MD;  Location: [facility] INVASIVE CV LAB;  Service: Cardiovascular;  Laterality: N/A;   RENAL ANGIOGRAPHY N/A 07/16/2020   Procedure: RENAL ANGIOGRAPHY;  Surgeon: Ladona Heinz, MD;  Location: [facility] INVASIVE CV LAB;  Service: Cardiovascular;  Laterality: N/A;   SACROILIAC JOINT FUSION Right 05/02/2014   Procedure: SACROILIAC JOINT FUSION;  Surgeon: Oneil Rodgers Priestly, MD;  Location: [facility] OR;  Service: Orthopedics;  Laterality: Right;  Right sided sacroiliac joint fusion   TONSILLECTOMY     uvula removed     Current Outpatient Medications on File Prior to Visit  Medication Sig Dispense Refill   acetaminophen  (TYLENOL ) 500 MG tablet Take 2 tablets (1,000 mg total) by mouth every 6 (six) hours as needed for moderate pain (pain score 4-6). 30 tablet 2   apixaban  (ELIQUIS ) 5 MG TABS tablet Take 1 tablet (5 mg total) by mouth 2 (two) times daily. 180 tablet 3   atorvastatin  (LIPITOR) 80 MG tablet Take 1 tablet (80 mg total) by mouth daily. 30 tablet 6   cetirizine  (ZYRTEC ) 10 MG tablet Take 1 tablet (10 mg total) by mouth daily. (Patient taking differently: Take 10 mg by mouth daily as needed for allergies.) 100 tablet 0   Cholecalciferol (VITAMIN D) 50 MCG (2000 UT) tablet Take 4,000 Units by mouth daily.     clopidogrel  (PLAVIX ) 75 MG tablet Take 1 tablet (75 mg total) by mouth daily. 90 tablet 3   Continuous Glucose Sensor (DEXCOM G7 SENSOR) MISC Change sensor every 10 days 9 each 3   empagliflozin  (JARDIANCE ) 25 MG TABS tablet Take 1 tablet (25 mg total) by mouth daily before breakfast. 90 tablet 3   FIBER GUMMIES PO Take 2 capsules by mouth daily.     fluticasone  (FLONASE ) 50 MCG/ACT nasal spray Place 1 spray into both nostrils as needed for allergies or rhinitis.     insulin  glargine (LANTUS  SOLOSTAR) 100 UNIT/ML Solostar Pen Inject 60 Units into the skin daily. (Patient taking differently: Inject 70 Units into the  skin daily.) 70 mL 3   insulin  lispro (HUMALOG  KWIKPEN) 200 UNIT/ML KwikPen Max daily 100 units (Patient taking differently: Inject 40 Units into the skin 2 (two) times daily with a meal. May inject a third 40 unit dose as needed for high blood sugar) 45 mL 3   Insulin  Pen Needle (UNIFINE PENTIPS) 32G X 4 MM MISC Use with Levemir  100 each 0   Insulin  Pen Needle 32G X 4 MM MISC 1 Device by Does not apply route in the morning, at noon, in the evening, and at bedtime. 400 each 3   nitroGLYCERIN  (NITROSTAT ) 0.4 MG SL tablet Place 1 tablet (0.4 mg total) under the tongue every 5 (five) minutes as needed for chest pain. 25 tablet 4   REPATHA  SURECLICK 140 MG/ML SOAJ ADMINISTER 1 ML UNDER THE SKIN EVERY 14 DAYS. 6 mL 3   rOPINIRole  (REQUIP ) 1 MG tablet Take 1 tablet (1 mg total) by mouth daily. 90 tablet 3   Semaglutide , 1 MG/DOSE, 4 MG/3ML SOPN Inject 1 mg as directed once a week. 9 mL 3   triamcinolone  cream (KENALOG ) 0.1 % Apply 1 Application topically 2 (  two) times daily. (Patient taking differently: Apply 1 Application topically 2 (two) times daily as needed (rash).) 30 g 0   vitamin B-12 (CYANOCOBALAMIN) 500 MCG tablet Take 500 mcg by mouth daily.     vortioxetine  HBr (TRINTELLIX ) 20 MG TABS tablet Take 1 tablet (20 mg total) by mouth daily. 30 tablet 0   carvedilol  (COREG ) 12.5 MG tablet Take 1.5 tablets (18.75 mg total) by mouth 2 (two) times daily. 270 tablet 3   No current facility-administered medications on file prior to visit.    Allergies  Allergen Reactions   Pravastatin Other (See Comments)    Joint myalgias   Trazodone And Nefazodone     Had restless legs   Ezetimibe  Other (See Comments)    Myalgias, joint pain   Hydrochlorothiazide  Other (See Comments)    lightheadedness   Latex Rash   Metformin  And Related Diarrhea     DIAGNOSTIC DATA (LABS, IMAGING, TESTING) - I reviewed patient records, labs, notes, testing and imaging myself where available.  Lab Results  Component  Value Date   WBC 10.2 01/25/2024   HGB 18.0 (H) 01/25/2024   HCT 53.2 (H) 01/25/2024   MCV 93 01/25/2024   PLT 219 01/25/2024      Component Value Date/Time   NA 145 (H) 01/25/2024 0817   K 4.6 01/25/2024 0817   CL 105 01/25/2024 0817   CO2 27 01/25/2024 0817   GLUCOSE 114 (H) 01/25/2024 0817   GLUCOSE 98 11/11/2023 0606   BUN 18 01/25/2024 0817   CREATININE 0.94 01/25/2024 0817   CALCIUM  9.9 01/25/2024 0817   PROT 7.1 09/21/2023 0936   ALBUMIN 4.5 09/21/2023 0936   AST 21 09/21/2023 0936   ALT 20 09/21/2023 0936   ALKPHOS 58 09/21/2023 0936   BILITOT 0.4 09/21/2023 0936   GFRNONAA >60 11/11/2023 0606   GFRAA 80 10/29/2020 1333   Lab Results  Component Value Date   CHOL 154 09/21/2023   HDL 43 09/21/2023   LDLCALC 82 09/21/2023   LDLDIRECT 93 08/20/2009   TRIG 167 (H) 09/21/2023   CHOLHDL 3.6 09/21/2023   Lab Results  Component Value Date   HGBA1C 6.1 (A) 01/26/2024   Lab Results  Component Value Date   VITAMINB12 363 01/20/2011   Lab Results  Component Value Date   TSH 1.60 01/26/2024    PHYSICAL EXAM:  Today's Vitals   02/04/24 1052  BP: 127/77  Pulse: 79  Weight: 212 lb (96.2 kg)  Height: 5' 10 (1.778 m)   Body mass index is 30.42 kg/m.   Wt Readings from Last 3 Encounters:  02/04/24 212 lb (96.2 kg)  02/01/24 200 lb (90.7 kg)  01/26/24 207 lb (93.9 kg)     Ht Readings from Last 3 Encounters:  02/04/24 5' 10 (1.778 m)  02/01/24 5' 10 (1.778 m)  01/26/24 5' 10 (1.778 m)      General: The patient is awake, alert and appears not in acute distress. The patient is well groomed. Head: Normocephalic, atraumatic. Neck is supple. Mallampati  3- Status post UPPP, 2,  neck circumference:18.00 .  Nasal airflow is sestricted when lying down- , patent while seated.  Retrognathia is not seen.  Cardiovascular:  Regular rate and rhythm , without  murmurs or carotid bruit, and without distended neck veins. Respiratory: Lungs are clear to  auscultation. Skin:  Facial reddening.  Trunk: . The patient's posture is relaxed   Neurologic exam : The patient is awake and alert, oriented to place  and time.    Attention span & concentration ability appears normal.  Speech is fluent,  without dysarthria, dysphonia or aphasia.  Mood and affect are anxious, and irritable.    Cranial nerves: Pupils are equal and briskly reactive to light. Funduscopic exam without  evidence of pallor or edema. Extraocular movements  in vertical and horizontal planes intact and without nystagmus.  Visual fields by finger perimetry are intact. Hearing to finger rub intact.   Facial sensation intact to fine touch.  Facial motor strength is symmetric and tongue and uvula move midline. Shoulder shrug was symmetrical.    Motor exam: Normal tone, muscle bulk and symmetric strength in all extremities. Sensory:  Fine touch, pinprick and vibration were normal. Numbness in his toes. No numbness in the fingertips.    Coordination: Rapid alternating movements in the fingers/hands were of normal speed.  The Finger-to-nose maneuver was intact without evidence of ataxia, dysmetria or tremor.   Gait and station: Patient could rise unassisted from a seated position, walked without assistive device.  Deep tendon reflexes: in the  upper and lower extremities are symmetric and intact.  Babinski response was deferred     ASSESSMENT AND PLAN 63 y.o. year old male  here with:   History of mild OSA, hypopnea-  has a BIPAP but not used in 5 years.  Excessive daytime  sleepiness, snoring,  mouth breathing -loud snoring Overbite .  status post UPPP>  nasal septal deviation, sinusitis -. Depression - morning headaches daily !      1)  EDS,  likely  with now untreated apnea ( bipap hasn't been used in years )   2) lifestyle changes, weight reduction by BMI 32 to BMI 30, smaller neck may have helped,   ENT surgery - he likes to address sinus disease and nasal septum .    3) still high degree of fatigue and depression - all of these worse since CAD, heart attack last Summer.   Known past dx of OSA but was treated with a BiPAP until 2019.   I will  first order a HST and if positive again, we should try for in lab  titration . [facility] Medicaid )  I will also refer to Dr Saldatova.   I plan to follow up either personally or through our NP within 3-5 months.   I would like to thank [name] Pac, DO and Dorn Lesches, MD for allowing me to meet with and to take care of this pleasant patient.     After spending a total time of  35  minutes face to face and additional time for physical and neurologic examination, review of laboratory studies,  personal review of imaging studies, reports and results of other testing and review of referral information / records as far as provided in visit,   Electronically signed by: [name] Gores, MD 02/04/2024 11:16 AM  [facility] Sleep Board certified by The [employer] of Sleep Medicine and Diplomate of the [employer] of Sleep Medicine. Board certified In Neurology through the ABPN, Fellow of the [employer] of Neu[employer].

## 2024-02-04 NOTE — Patient Instructions (Signed)
 SSESSMENT AND PLAN 63 y.o. year old male  here with:   History of mild OSA, hypopnea-  has a BIPAP but not used in 5 years.  Excessive daytime  sleepiness, snoring,  mouth breathing -loud snoring Overbite .  status post UPPP>  nasal septal deviation, sinusitis -. Depression - morning headaches daily !      1)  EDS,  likely  with now untreated apnea ( bipap hasn't been used in years )   2) lifestyle changes, weight reduction by BMI 32 to BMI 30, smaller neck may have helped,   ENT surgery - he likes to address sinus disease and nasal septum .   3) still high degree of fatigue and depression - all of these worse since CAD, heart attack last Summer.   Known past dx of OSA but was treated with a BiPAP until 2019.   I will  first order a HST and if positive again, we should try for in lab  titration . [facility] Medicaid )  I will also refer to Dr Saldatova.  If he can breathe through his nose, he may not have apnea anymore.  He remains on ozempic .  Weight loss is helpful in OSA.  Exercise for core strength   I plan to follow up either personally or through our NP within 3-5 months.   I would like to thank Jolaine Pac, DO and Dorn Lesches, MD for allowing me to meet with and to take care of this pleasant patient.

## 2024-02-07 ENCOUNTER — Telehealth: Payer: Self-pay | Admitting: Cardiology

## 2024-02-07 ENCOUNTER — Other Ambulatory Visit: Payer: Self-pay

## 2024-02-07 ENCOUNTER — Telehealth: Payer: Self-pay | Admitting: Internal Medicine

## 2024-02-07 DIAGNOSIS — I480 Paroxysmal atrial fibrillation: Secondary | ICD-10-CM

## 2024-02-07 DIAGNOSIS — D6869 Other thrombophilia: Secondary | ICD-10-CM

## 2024-02-07 MED ORDER — DEXCOM G7 SENSOR MISC: 3 refills | Status: DC

## 2024-02-07 NOTE — Telephone Encounter (Signed)
 Pt called in stating he has a knot in his groin area where ablation was done. Please advise.

## 2024-02-07 NOTE — Telephone Encounter (Signed)
 Patient called and said that the Pharmacy that the Dexcom G7 Sensor was sent to can't get it for 2 months and asked if it can be sent to [facility] [id number] Jonette Nestle, [place] - [address] [place] [[id number]]

## 2024-02-07 NOTE — Telephone Encounter (Signed)
 Spoke with the patient who reports that he has a large knot on the left side of his groin from his ablation. He reports that it is hard and sore. He denies any redness, swelling or drainage. He states that he first noticed it on Friday. He states that it has gotten slightly larger since then. He reports that the knot is about the size of one of his testicles.

## 2024-02-07 NOTE — Telephone Encounter (Signed)
 Prescription has been sent.

## 2024-02-07 NOTE — Telephone Encounter (Signed)
 Per Dr. Daneil Dunker, patient should lie on a flat, preferably hard, surface and hold pressure for 20-30 minutes. He would like for the patient to get an ultrasound. If the site becomes more painful or gets larger patient should go to the ER.  Made patient aware of recommendations. Patient verbalized understanding.  Patient does have a follow up scheduled with Dr. Arlester Ladd on Thursday, 2/13

## 2024-02-08 ENCOUNTER — Telehealth: Payer: Self-pay

## 2024-02-08 ENCOUNTER — Ambulatory Visit (HOSPITAL_COMMUNITY)
Admission: RE | Admit: 2024-02-08 | Discharge: 2024-02-08 | Disposition: A | Discharge status: Home / Self Care | Payer: Medicaid Other | Source: Ambulatory Visit | Attending: Cardiovascular Disease | Admitting: Cardiovascular Disease

## 2024-02-08 DIAGNOSIS — I97638 Postprocedural hematoma of a circulatory system organ or structure following other circulatory system procedure: Secondary | ICD-10-CM | POA: Insufficient documentation

## 2024-02-08 DIAGNOSIS — D6869 Other thrombophilia: Secondary | ICD-10-CM | POA: Diagnosis not present

## 2024-02-08 DIAGNOSIS — I480 Paroxysmal atrial fibrillation: Secondary | ICD-10-CM | POA: Insufficient documentation

## 2024-02-08 NOTE — Telephone Encounter (Signed)
Spoke with the patient and reviewed results and recommendations from Dr. Jimmey Ralph. He verbalized understanding.

## 2024-02-08 NOTE — Telephone Encounter (Signed)
-----   Message from Nobie Putnam sent at 02/08/2024  2:30 PM EST ----- Just a hematoma. Tell him to keep monitoring. Avoid heavy lifting, exercise, strenuous activities for another 5 days. If he has constipation then to take a stool softener so not to bear down.   Josh ----- Message ----- From: Rosiland Oz, RVT, RDCS Sent: 02/08/2024   1:27 PM EST To: Nobie Putnam, MD

## 2024-02-09 ENCOUNTER — Telehealth: Payer: Self-pay | Admitting: Neurology

## 2024-02-09 NOTE — Telephone Encounter (Signed)
I called patient and explained on office note on 27/25 the ENT referral has been placed and sleep study has been ordered. Per note it sounds like she wanted to have the ENT referral and sleep study and based on those results if additional testing is needed pt will be notified.   Pt verbalized he understood

## 2024-02-09 NOTE — Telephone Encounter (Signed)
Pt called wanting to know when a referral for a dentist will be sent in for him. Pt states that the provider informed him in his last office visit that he will be referred to a dentist so that he can get fitted for a mouth piece to help with his sleep apnea. Information has been given to him about the ENT referral that was put in for him but was informed that a note will be sent to the RN regarding the Dentist referral. Please advise.

## 2024-02-10 ENCOUNTER — Telehealth: Payer: Self-pay

## 2024-02-10 ENCOUNTER — Telehealth: Payer: Self-pay | Admitting: Pharmacy Technician

## 2024-02-10 ENCOUNTER — Telehealth: Payer: Self-pay | Admitting: Internal Medicine

## 2024-02-10 ENCOUNTER — Ambulatory Visit: Payer: Medicaid Other | Attending: Cardiovascular Disease | Admitting: Cardiovascular Disease

## 2024-02-10 ENCOUNTER — Other Ambulatory Visit (HOSPITAL_COMMUNITY): Payer: Self-pay

## 2024-02-10 NOTE — Telephone Encounter (Signed)
PA request has been Submitted. New Encounter created for follow up. For additional info see Pharmacy Prior Auth telephone encounter from 02/10/24.

## 2024-02-10 NOTE — Telephone Encounter (Signed)
Patient states that he is very frustrated with the prior authorization process. Patient states Karin Golden and Sandi Mealy were suppose to have sent the request over a week ago. I did advise that I didn't see any documentation of the PA request from a week ago on Insulin Lispro and the Dexcom. I advised that I send a message today for them to expedite and that it has been submitted and awaiting determination. Patient advise me that he is not on the phone to hear himself talk and to make sure I express the concerns to the office manager.

## 2024-02-10 NOTE — Telephone Encounter (Addendum)
Pharmacy Patient Advocate Encounter   Received notification from Pt Calls msgs that prior authorization for Humalog U200 is required/requested.   Insurance verification completed.   The patient is insured through [facility] MEDICAID/[employer] .   Per test claim: PA required; PA submitted to above mentioned insurance via CoverMyMeds Key/confirmation #/EOC [facility] Status is pending   PA Case ID #: [id number]

## 2024-02-10 NOTE — Telephone Encounter (Signed)
Dexcom and Humalog need PA

## 2024-02-10 NOTE — Telephone Encounter (Signed)
Pharmacy Patient Advocate Encounter   Received notification from Pt Calls Messages that prior authorization for Dexcom G7 Sensor is required/requested.   Insurance verification completed.   The patient is insured through [facility] MEDICAID/[employer] Medicaid .   Per test claim: PA required; PA submitted to above mentioned insurance via CoverMyMeds Key/confirmation #/EOC  [id number] Status is pending

## 2024-02-10 NOTE — Telephone Encounter (Signed)
Patient calling with concerns about Prior Auth's for Dexcom and Lispro

## 2024-02-10 NOTE — Progress Notes (Deleted)
 Cardiology Office Note:    Date:  02/10/2024   ID:  [name] [name], DOB 1961-03-31, MRN [id number]  PCP:  Rocky Morel, DO   [facility] Providers Cardiologist:  Tonny Bollman, MD Electrophysiologist:  Nobie Putnam, MD { Click to update primary MD,subspecialty MD or APP then REFRESH:1}    Referring MD: Rocky Morel, DO   No chief complaint on file. ***  History of Present Illness:    [name] [name] is a 63 y.o. male presenting for follow-up of CAD and paroxysmal atrial fibrillation. Comorbid conditions include HTN, hyperlipidemia, diabetes, and obstructive sleep apnea.  The patient initially presented with an inferior STEMI in July 2024 treated with primary PCI of the left circumflex.  He underwent staged PCI of severe stenosis in the proximal LAD in November 2024.  He developed recurrent problems with symptomatic atrial fibrillation and he was evaluated by Dr. Jimmey Ralph. He underwent atrial fib ablation February 01, 2024.  The patient called in with a groin lump after his ablation procedure and a vascular ultrasound was done, demonstrating hematoma but no evidence of pseudoaneurysm, AV fistula, or DVT.  Current Medications: No outpatient medications have been marked as taking for the 02/10/24 encounter (Appointment) with Tonny Bollman, MD.     Allergies:   Pravastatin, Trazodone and nefazodone, Ezetimibe, Hydrochlorothiazide, Latex, and Metformin and related   ROS:   Please see the history of present illness.    *** All other systems reviewed and are negative.  EKGs/Labs/Other Studies Reviewed:    The following studies were reviewed today: Cardiac Studies & Procedures   ______________________________________________________________________________________________ CARDIAC CATHETERIZATION  CARDIAC CATHETERIZATION 11/10/2023  Narrative Successful IVUS guided PCI of severe ulcerated stenosis in the proximal LAD, reducing 70% stenosis to 0% with a 4.0 x 16 mm  Synergy DES  Recommendations: Continue clopidogrel without interruption for at least another 6 months.  Resume apixaban tomorrow.  No concomitant aspirin until clopidogrel discontinued.  Anticipate hospital discharge tomorrow morning.  IVUS findings: Reference vessel diameter 4.5 mm maximum.  Lesion is calcified and ulcerated also with concentric fibrous plaquing noted.  Lesion extends back to the ostium of the LAD.  Post PCI IVUS findings: Stent well-expanded, well apposed throughout with good proximal and distal transitions.  Findings Coronary Findings Diagnostic  Dominance: Right  Left Anterior Descending Prox LAD lesion is 70% stenosed. The lesion is ulcerative.  Left Circumflex Non-stenotic Prox Cx to Mid Cx lesion was previously treated. Non-stenotic Mid Cx to Dist Cx lesion was previously treated. The lesion is thrombotic.  Right Coronary Artery There is mild diffuse disease throughout the vessel.  Intervention  Prox LAD lesion Stent CATH VISTA GUIDE 6FR XBLAD3.5 guide catheter was inserted. Pre-stent angioplasty was not performed. A drug-eluting stent was successfully placed using a SYNERGY XD 4.0X20. Post-stent angioplasty was performed using a BALL SAPPHIRE NC24 4.5X12. Maximum pressure:  16 atm. Post-Intervention Lesion Assessment The intervention was successful. Pre-interventional TIMI flow is 3. Post-intervention TIMI flow is 3. No complications occurred at this lesion. Ultrasound (IVUS) was performed on the lesion post PCI using a CATH OPTICROSS HD. Stent well apposed. Lesion characteristics:  calcified, eccentric and ulcerative. There is a 0% residual stenosis post intervention.   CARDIAC CATHETERIZATION  CARDIAC CATHETERIZATION 07/25/2023  Narrative   Mid Cx to Dist Cx lesion is 100% stenosed.   Prox Cx to Mid Cx lesion is 80% stenosed.   Prox LAD lesion is 70% stenosed.   A drug-eluting stent was successfully placed using a SYNERGY XD 2.50X24.  A drug-eluting  stent was successfully placed using a STENT MEGATRON 3.5X16.   Post intervention, there is a 0% residual stenosis.   Post intervention, there is a 0% residual stenosis.   LV end diastolic pressure is low.   The left ventricular ejection fraction is 50-55% by visual estimate.   Recommend uninterrupted dual antiplatelet therapy with Aspirin 81mg  daily and Ticagrelor 90mg  twice daily for a minimum of 12 months (ACS-Class I recommendation).  Acute inferolateral MI secondary to occlusion of the distal circumflex, treated successful with primary PCI using a 2.5x24 mm Synergy DES Severe mid-circumflex stenosis, treated with a 3.5x16 mm Megatron DES (80%-->0) Moderately severe ulcerative plaque in the proximal LAD of 70% Nonobstructive RCA stenosis Mild segmental LV dysfunction with LVEF estimated at 50-55%  Recommend: post-MI medical therapy. Consider staged IVUS +/- PCI of the proximal LAD  Findings Coronary Findings Diagnostic  Dominance: Right  Left Anterior Descending Prox LAD lesion is 70% stenosed. The lesion is ulcerative.  Left Circumflex Prox Cx to Mid Cx lesion is 80% stenosed. Mid Cx to Dist Cx lesion is 100% stenosed. The lesion is thrombotic.  Right Coronary Artery There is mild diffuse disease throughout the vessel.  Intervention  Prox Cx to Mid Cx lesion Stent A drug-eluting stent was successfully placed using a STENT MEGATRON 3.5X16. Maximum pressure: 14 atm. Post-stent angioplasty was not performed. Post-Intervention Lesion Assessment The intervention was successful. Pre-interventional TIMI flow is 3. Post-intervention TIMI flow is 3. No complications occurred at this lesion. There is a 0% residual stenosis post intervention.  Mid Cx to Dist Cx lesion Stent CATH LAUNCHER 6FR EBU3.5 guide catheter was inserted. Lesion crossed with guidewire using a WIRE COUGAR XT STRL 190CM. Pre-stent angioplasty was performed using a BALLN EMERGE MR 2.5X12. A drug-eluting stent was  successfully placed using a SYNERGY XD 2.50X24. Post-stent angioplasty was performed using a BALLN [place] EMERGE MR [id number]. Maximum pressure:  16 atm. Post-Intervention Lesion Assessment The intervention was successful. Pre-interventional TIMI flow is 0. Post-intervention TIMI flow is 3. No complications occurred at this lesion. There is a 0% residual stenosis post intervention.   STRESS TESTS  PCV MYOCARDIAL PERFUSION WITH LEXISCAN 03/11/2020  Narrative Lexiscan/modified Bruce protocol Sestamibi stress test 03/11/2020: No previous exam available for comparison. Lexiscan nuclear stress test performed using 1-day protocol. Stress EKG is non-diagnostic, as this is pharmacological stress test using Lexiscan. Hypotension on noted during stress with lexiscan and modified Bruce protocol exercise, reaching 59% MPHR. Myocardial perfusion imaging is normal. Left ventricular ejection fraction is  64% with normal wall motion. High risk study due to hypotensive response. Clinical correlation recommended.   ECHOCARDIOGRAM  ECHOCARDIOGRAM COMPLETE 07/26/2023  Narrative ECHOCARDIOGRAM REPORT    Patient Name:   Abdel Sakata Date of Exam: 07/26/2023 Medical Rec #:  [id number]      Height:       70.0 in Accession #:    [id number]     Weight:       163.8 lb Date of Birth:  24-Oct-1961      BSA:          1.918 m Patient Age:    62 years       BP:           104/65 mmHg Patient Gender: M              HR:           79 bpm. Exam Location:  Inpatient  Procedure: 2D Echo, Color Doppler, Cardiac Doppler  and Intracardiac Opacification Agent  Indications:    Acute ischemic heart disease  History:        Patient has prior history of Echocardiogram examinations, most recent 03/12/2020. CAD and Acute MI, Signs/Symptoms:Shortness of Breath and Chest Pain; Risk Factors:Hypertension, Diabetes, Dyslipidemia and Sleep Apnea.  Sonographer:    Milda Smart Referring Phys: [phone] Lleyton Byers   Sonographer  Comments: Image acquisition challenging due to respiratory motion. IMPRESSIONS   1. Left ventricular ejection fraction, by estimation, is 50 to 55%. The left ventricle has low normal function. The left ventricle demonstrates regional wall motion abnormalities with basal inferolateral and basal anterolateral akinesis. There is mild concentric left ventricular hypertrophy. Left ventricular diastolic parameters are consistent with Grade I diastolic dysfunction (impaired relaxation). 2. Right ventricular systolic function is normal. The right ventricular size is normal. Tricuspid regurgitation signal is inadequate for assessing PA pressure. 3. The mitral valve is normal in structure. Trivial mitral valve regurgitation. No evidence of mitral stenosis. 4. The aortic valve is tricuspid. There is mild calcification of the aortic valve. Aortic valve regurgitation is not visualized. No aortic stenosis is present. 5. The inferior vena cava is dilated in size with >50% respiratory variability, suggesting right atrial pressure of 8 mmHg.  FINDINGS Left Ventricle: Left ventricular ejection fraction, by estimation, is 50 to 55%. The left ventricle has low normal function. The left ventricle demonstrates regional wall motion abnormalities. Definity contrast agent was given IV to delineate the left ventricular endocardial borders. The left ventricular internal cavity size was normal in size. There is mild concentric left ventricular hypertrophy. Left ventricular diastolic parameters are consistent with Grade I diastolic dysfunction (impaired relaxation).  Right Ventricle: The right ventricular size is normal. No increase in right ventricular wall thickness. Right ventricular systolic function is normal. Tricuspid regurgitation signal is inadequate for assessing PA pressure.  Left Atrium: Left atrial size was normal in size.  Right Atrium: Right atrial size was normal in size.  Pericardium: There is no evidence  of pericardial effusion.  Mitral Valve: The mitral valve is normal in structure. Trivial mitral valve regurgitation. No evidence of mitral valve stenosis. MV peak gradient, 2.9 mmHg. The mean mitral valve gradient is 1.0 mmHg.  Tricuspid Valve: The tricuspid valve is normal in structure. Tricuspid valve regurgitation is trivial.  Aortic Valve: The aortic valve is tricuspid. There is mild calcification of the aortic valve. Aortic valve regurgitation is not visualized. No aortic stenosis is present.  Pulmonic Valve: The pulmonic valve was normal in structure. Pulmonic valve regurgitation is not visualized.  Aorta: The aortic root is normal in size and structure.  Venous: The inferior vena cava is dilated in size with greater than 50% respiratory variability, suggesting right atrial pressure of 8 mmHg.  IAS/Shunts: No atrial level shunt detected by color flow Doppler.   LEFT VENTRICLE PLAX 2D LVIDd:         3.70 cm     Diastology LVIDs:         2.90 cm     LV e' medial:    4.68 cm/s LV PW:         1.90 cm     LV E/e' medial:  13.5 LV IVS:        1.20 cm     LV e' lateral:   4.68 cm/s LVOT diam:     2.40 cm     LV E/e' lateral: 13.5 LV SV:         75 LV SV Index:  39 LVOT Area:     4.52 cm  LV Volumes (MOD) LV vol d, MOD A2C: 68.7 ml LV vol d, MOD A4C: 58.7 ml LV vol s, MOD A4C: 27.3 ml LV SV MOD A4C:     58.7 ml  RIGHT VENTRICLE RV Basal diam:  3.20 cm RV S prime:     10.30 cm/s TAPSE (M-mode): 1.9 cm  LEFT ATRIUM             Index        RIGHT ATRIUM           Index LA diam:        4.00 cm 2.09 cm/m   RA Area:     12.55 cm LA Vol (A2C):   44.4 ml 23.16 ml/m  RA Volume:   30.70 ml  16.01 ml/m LA Vol (A4C):   36.8 ml 19.19 ml/m LA Biplane Vol: 41.1 ml 21.43 ml/m AORTIC VALVE LVOT Vmax:   88.60 cm/s LVOT Vmean:  66.600 cm/s LVOT VTI:    0.166 m  AORTA Ao Root diam: 3.20 cm Ao Asc diam:  3.20 cm  MITRAL VALVE MV Area (PHT): 2.60 cm    SHUNTS MV Area VTI:    3.17 cm    Systemic VTI:  0.17 m MV Peak grad:  2.9 mmHg    Systemic Diam: 2.40 cm MV Mean grad:  1.0 mmHg MV Vmax:       0.86 m/s MV Vmean:      56.4 cm/s MV Decel Time: 292 msec MR Peak grad: 17.1 mmHg MR Vmax:      206.50 cm/s MV E velocity: 63.10 cm/s MV A velocity: 64.70 cm/s MV E/A ratio:  0.98  Dalton McleanMD Electronically signed by Wilfred Lacy Signature Date/Time: 07/26/2023/9:06:56 AM    Final    MONITORS  LONG TERM MONITOR (3-14 DAYS) 12/28/2023  Narrative Patch Wear Time:  6 days and 12 hours (2024-12-17T10:11:25-0500 to 2024-12-23T22:19:51-0500)  HR 57 - 176, average 78 bpm. 13 nonsustained SVT (longest 18 beats) No atrial fibrillation detected. Rare supraventricular ectopy. Rare ventricular ectopy. No sustained arrhythmias. Only 1 symptom trigger episode, which corresponded to sinus rhythm with a single PVC.  Nobie Putnam Cardiac Electrophysiology   CT SCANS  CT CORONARY FRACTIONAL FLOW RESERVE DATA PREP 04/02/2020  Narrative CLINICAL DATA:  CAD  EXAM: FFR CT  TECHNIQUE: The best systolic and diastolic phases of the patients gated cardiac CT sent for hemodynamic analysis  FINDINGS: No FFR CT findings < 0.80  RCA 0.99  Circumflex 0.90  LAD 0.94 proximally 0.88 mid vessel 0.84 distally  D1: 0.89  IMPRESSION: FFR CT does not suggest hemodynamically significant lesions in LAD/Diagonal see above. Medical Rx and aggressive risk factor modification  With close cardiology f/u indicated  Charlton Haws   Electronically Signed By: Charlton Haws M.D. On: 04/02/2020 17:59   CT CORONARY MORPH W/CTA COR W/SCORE 04/02/2020  Addendum 04/02/2020  2:48 PM ADDENDUM REPORT: 04/02/2020 14:45  CLINICAL DATA:  Chest pain  EXAM: Cardiac CTA  MEDICATIONS: Sub lingual nitro. 4 mg and lopressor 5mg  iv  TECHNIQUE: The patient was scanned on a [employer] 192 scanner. Gantry rotation speed was 250 msecs. Collimation was. 6 mm . A 120  kV prospective scan was triggered in the ascending thoracic aorta at 140 HU's with full mA between 30-70% of the R-R interval . Average HR during the scan was 61 bpm. The 3D data set was interpreted on a dedicated work station using MPR, MIP  and VRT modes. A total of 80 cc of contrast was used.  FINDINGS: Non-cardiac: See separate report from [facility]. No significant findings on limited lung and soft tissue windows.  Calcium score: 3 vessel calcium noted most prominent in LAD  Coronary Arteries: Right dominant with no anomalies  LM: Normal  LAD: 50-69% mixed plaque with prominent soft plaque component in proximal vessel 50-69% calcific plaque in mid/distal vessel  D1: 25-49% soft plaque in ostial vessel  D2: Small vessel 50-69% ostial calcific plaque  D3: Small vessel  Circumflex: 1-24% soft plaque in mid vessel  OM1: Normal  AV Groove: Normal  RCA: 1-24% calcified ostial stenosis  PDA: Normal  PLB: Normal  IMPRESSION: 1. Calcium score 95 which is 76 th percentile for age and sex  2.  Normal aortic root 2.9 cm  3.  CAD CAD RADS 3 most significant in LAD and diagonal  4.  Study sent for FFR CT  Charlton Haws   Electronically Signed By: Charlton Haws M.D. On: 04/02/2020 14:45  Narrative EXAM: OVER-READ INTERPRETATION  CT CHEST  The following report is an over-read performed by radiologist Dr. Irish Lack of [facility] on 04/02/2020. This over-read does not include interpretation of cardiac or coronary anatomy or pathology. The coronary CTA interpretation by the cardiologist is attached.  COMPARISON:  None.  FINDINGS: Vascular: No incidental findings.  Mediastinum/Nodes: Visualized mediastinum and hilar regions demonstrate no lymphadenopathy or masses. Suggestion of mild circumferential thickening of the distal esophagus with probable small hiatal hernia. Correlation suggested with any symptoms chronic gastroesophageal  reflux or esophageal inflammation.  Lungs/Pleura: Visualized lungs show no evidence of pulmonary edema, consolidation, pneumothorax, nodule or pleural fluid.  Upper Abdomen: No acute abnormality.  Musculoskeletal: No chest wall mass or suspicious bone lesions identified.  IMPRESSION: Suggestion of mild circumferential thickening of the distal esophagus with probable small hiatal hernia. Correlation suggested with any symptoms of chronic gastroesophageal reflux or esophageal inflammation.  Electronically Signed: By: Irish Lack M.D. On: 04/02/2020 14:14     ______________________________________________________________________________________________      EKG:        Recent Labs: 07/26/2023: Magnesium 2.1 09/21/2023: ALT 20 01/25/2024: BUN 18; Creatinine, Ser 0.94; Hemoglobin 18.0; Platelets 219; Potassium 4.6; Sodium 145 01/26/2024: TSH 1.60  Recent Lipid Panel    Component Value Date/Time   CHOL 154 09/21/2023 0936   TRIG 167 (H) 09/21/2023 0936   HDL 43 09/21/2023 0936   CHOLHDL 3.6 09/21/2023 0936   CHOLHDL 6.5 07/25/2023 1316   VLDL 22 07/25/2023 1316   LDLCALC 82 09/21/2023 0936   LDLDIRECT 93 08/20/2009 2019     Risk Assessment/Calculations:   {Does this patient have ATRIAL FIBRILLATION?:[id number]}  No BP recorded.  {Refresh Note OR Click here to enter BP  :1}***         Physical Exam:    VS:  There were no vitals taken for this visit.    Wt Readings from Last 3 Encounters:  02/04/24 212 lb (96.2 kg)  02/01/24 200 lb (90.7 kg)  01/26/24 207 lb (93.9 kg)     GEN: *** Well nourished, well developed in no acute distress HEENT: Normal NECK: No JVD; No carotid bruits LYMPHATICS: No lymphadenopathy CARDIAC: ***RRR, no murmurs, rubs, gallops RESPIRATORY:  Clear to auscultation without rales, wheezing or rhonchi  ABDOMEN: Soft, non-tender, non-distended MUSCULOSKELETAL:  No edema; No deformity  SKIN: Warm and dry NEUROLOGIC:  Alert and oriented  x 3 PSYCHIATRIC:  Normal affect   Assessment &  Plan Paroxysmal atrial fibrillation (HCC)  Coronary artery disease involving native coronary artery of native heart without angina pectoris  Mixed hyperlipidemia  Resistant hypertension        {Are you ordering a CV Procedure (e.g. stress test, cath, DCCV, TEE, etc)?   Press F2        :[id number]}    Medication Adjustments/Labs and Tests Ordered: Current medicines are reviewed at length with the patient today.  Concerns regarding medicines are outlined above.  No orders of the defined types were placed in this encounter.  No orders of the defined types were placed in this encounter.   There are no Patient Instructions on file for this visit.   Signed, Tonny Bollman, MD  02/10/2024 8:05 AM    [place]  [facility]

## 2024-02-14 ENCOUNTER — Other Ambulatory Visit (HOSPITAL_COMMUNITY): Payer: Self-pay

## 2024-02-14 NOTE — Telephone Encounter (Signed)
Pharmacy Patient Advocate Encounter  Received notification from [facility] that Prior Authorization for Humalog 200U pen has been APPROVED from 02/10/24 to 02/09/25  filled 02/11/24 for a month supply.   PA #/Case ID/Reference #: PA Case ID # [id number]

## 2024-02-14 NOTE — Telephone Encounter (Signed)
Pharmacy Patient Advocate Encounter  Received notification from [facility] that Prior Authorization for Dexcom G7 Sensor has been APPROVED from 02/10/24 to 08/09/24  filled 02/11/24   PA #/Case ID/Reference #: PA Case ID #: [id number]

## 2024-02-14 NOTE — Telephone Encounter (Signed)
Both approved and it appears they were filled Friday. Hopefully the pt was already able to pick them up. Additional info in the pharmacy encounter notes.

## 2024-02-16 ENCOUNTER — Other Ambulatory Visit (HOSPITAL_COMMUNITY): Payer: Self-pay

## 2024-02-24 ENCOUNTER — Telehealth: Payer: Self-pay | Admitting: Internal Medicine

## 2024-02-24 ENCOUNTER — Encounter: Payer: Self-pay | Admitting: Emergency Medicine

## 2024-02-24 NOTE — Telephone Encounter (Signed)
 MEDICATION:  empagliflozin empagliflozin (JARDIANCE) 25 MG TABS tablet  PHARMACY:    [id number] - [place], [place] - [address] AT [facility] OF [address] (Ph: [phone])    HAS THE PATIENT CONTACTED THEIR PHARMACY?  Yes  IS THIS A 90 DAY SUPPLY : Yes  IS PATIENT OUT OF MEDICATION: No  IF NOT; HOW MUCH IS LEFT:   LAST APPOINTMENT DATE: @1 /29/2025  NEXT APPOINTMENT DATE:@5 /29/2025  DO WE HAVE YOUR PERMISSION TO LEAVE A DETAILED MESSAGE?: You  OTHER COMMENTS: Patient states that a prior authorization is needed & he would like it for a 90 day supply not 30.   **Let patient know to contact pharmacy at the end of the day to make sure medication is ready. **  ** Please notify patient to allow 48-72 hours to process**  **Encourage patient to contact the pharmacy for refills or they can request refills through [facility]**

## 2024-02-25 NOTE — Telephone Encounter (Signed)
 PA needed for the [facility]

## 2024-02-28 ENCOUNTER — Telehealth: Payer: Self-pay

## 2024-02-28 ENCOUNTER — Other Ambulatory Visit (HOSPITAL_COMMUNITY): Payer: Self-pay

## 2024-02-28 NOTE — Telephone Encounter (Signed)
 Pharmacy Patient Advocate Encounter   Received notification from Pt Calls Messages that prior authorization for Jardiance is required/requested.   Insurance verification completed.   The patient is insured through [facility] .   Per test claim: PA required; PA submitted to above mentioned insurance via CoverMyMeds Key/confirmation #/EOC [id number] Status is pending

## 2024-02-29 ENCOUNTER — Other Ambulatory Visit (HOSPITAL_COMMUNITY): Payer: Self-pay

## 2024-02-29 ENCOUNTER — Ambulatory Visit (HOSPITAL_COMMUNITY)
Admission: RE | Admit: 2024-02-29 | Discharge: 2024-02-29 | Disposition: A | Discharge status: Home / Self Care | Payer: Medicaid Other | Source: Ambulatory Visit | Attending: Internal Medicine | Admitting: Internal Medicine

## 2024-02-29 ENCOUNTER — Other Ambulatory Visit: Payer: Self-pay | Admitting: Student

## 2024-02-29 VITALS — BP 150/84 | HR 77 | Ht 70.0 in | Wt 207.4 lb

## 2024-02-29 DIAGNOSIS — E785 Hyperlipidemia, unspecified: Secondary | ICD-10-CM | POA: Insufficient documentation

## 2024-02-29 DIAGNOSIS — Z7984 Long term (current) use of oral hypoglycemic drugs: Secondary | ICD-10-CM | POA: Insufficient documentation

## 2024-02-29 DIAGNOSIS — Z7985 Long-term (current) use of injectable non-insulin antidiabetic drugs: Secondary | ICD-10-CM | POA: Diagnosis not present

## 2024-02-29 DIAGNOSIS — I480 Paroxysmal atrial fibrillation: Secondary | ICD-10-CM | POA: Insufficient documentation

## 2024-02-29 DIAGNOSIS — I10 Essential (primary) hypertension: Secondary | ICD-10-CM | POA: Insufficient documentation

## 2024-02-29 DIAGNOSIS — E119 Type 2 diabetes mellitus without complications: Secondary | ICD-10-CM | POA: Insufficient documentation

## 2024-02-29 DIAGNOSIS — I252 Old myocardial infarction: Secondary | ICD-10-CM | POA: Insufficient documentation

## 2024-02-29 DIAGNOSIS — Z794 Long term (current) use of insulin: Secondary | ICD-10-CM | POA: Diagnosis not present

## 2024-02-29 DIAGNOSIS — Z79899 Other long term (current) drug therapy: Secondary | ICD-10-CM | POA: Insufficient documentation

## 2024-02-29 DIAGNOSIS — D6869 Other thrombophilia: Secondary | ICD-10-CM | POA: Diagnosis not present

## 2024-02-29 DIAGNOSIS — Z7901 Long term (current) use of anticoagulants: Secondary | ICD-10-CM | POA: Diagnosis not present

## 2024-02-29 DIAGNOSIS — I2510 Atherosclerotic heart disease of native coronary artery without angina pectoris: Secondary | ICD-10-CM | POA: Insufficient documentation

## 2024-02-29 DIAGNOSIS — R002 Palpitations: Secondary | ICD-10-CM | POA: Insufficient documentation

## 2024-02-29 DIAGNOSIS — G4733 Obstructive sleep apnea (adult) (pediatric): Secondary | ICD-10-CM | POA: Diagnosis not present

## 2024-02-29 MED ORDER — CARVEDILOL 25 MG PO TABS: 25.0000 mg | ORAL_TABLET | Freq: Two times a day (BID) | ORAL | 1 refills | Status: DC

## 2024-02-29 NOTE — Patient Instructions (Addendum)
 Increase coreg to 25mg  twice a day

## 2024-02-29 NOTE — Telephone Encounter (Signed)
 Pharmacy Patient Advocate Encounter  Received notification from [facility] that Prior Authorization for [place] has been APPROVED through 02/27/25   PA #/Case ID/Reference #: [id number]

## 2024-02-29 NOTE — Progress Notes (Addendum)
 Primary Care Physician: Rocky Morel, DO Primary Cardiologist: Tonny Bollman, MD Electrophysiologist: Nobie Putnam, MD     Referring Physician: Dr. Mosetta Putt [name] [name] is a 63 y.o. male with a history of CAD s/p STEMI with PCI, HTN, HLD, T2DM, OSA, and paroxysmal atrial fibrillation who presents for consultation in the [facility]. Patient is on Eliquis 5 mg BID for a CHADS2VASC score of 3.  On evaluation today, patient is currently in NSR. S/p Afib ablation on 02/01/24 by Dr. Jimmey Ralph. LE U/S on 2/11 showed hematoma of left groin without evidence of pseudoaneurysm. He describes brief episodes of palpitations that can occur at times. Notes headaches that are frequent and BP remains elevated at home. No chest pain or SOB. Leg sites healed without issue. No missed doses of anticoagulant.  Today, he denies symptoms of orthopnea, PND, lower extremity edema, dizziness, presyncope, syncope, snoring, daytime somnolence, bleeding, or neurologic sequela. The patient is tolerating medications without difficulties and is otherwise without complaint today.    he has a BMI of Body mass index is 29.76 kg/m.Marland Kitchen Filed Weights   02/29/24 0833  Weight: 94.1 kg    Current Outpatient Medications  Medication Sig Dispense Refill   acetaminophen (TYLENOL) 500 MG tablet Take 2 tablets (1,000 mg total) by mouth every 6 (six) hours as needed for moderate pain (pain score 4-6). (Patient taking differently: Take 1,000 mg by mouth as needed for moderate pain (pain score 4-6).) 30 tablet 2   apixaban (ELIQUIS) 5 MG TABS tablet Take 1 tablet (5 mg total) by mouth 2 (two) times daily. 180 tablet 3   atorvastatin (LIPITOR) 80 MG tablet Take 1 tablet (80 mg total) by mouth daily. 30 tablet 6   cetirizine (ZYRTEC) 10 MG tablet Take 1 tablet (10 mg total) by mouth daily. (Patient taking differently: Take 10 mg by mouth daily as needed for allergies.) 100 tablet 0   clopidogrel (PLAVIX)  75 MG tablet Take 1 tablet (75 mg total) by mouth daily. 90 tablet 3   Continuous Glucose Sensor (DEXCOM G7 SENSOR) MISC Change sensor every 10 days 9 each 3   empagliflozin (JARDIANCE) 25 MG TABS tablet Take 1 tablet (25 mg total) by mouth daily before breakfast. 90 tablet 3   FIBER GUMMIES PO Take 2 capsules by mouth daily.     fluticasone (FLONASE) 50 MCG/ACT nasal spray Place 1 spray into both nostrils as needed for allergies or rhinitis.     insulin glargine (LANTUS SOLOSTAR) 100 UNIT/ML Solostar Pen Inject 60 Units into the skin daily. (Patient taking differently: Inject 70 Units into the skin daily.) 70 mL 3   insulin lispro (HUMALOG KWIKPEN) 200 UNIT/ML KwikPen Max daily 100 units (Patient taking differently: Inject 40 Units into the skin 2 (two) times daily with a meal. May inject a third 40 unit dose as needed for high blood sugar) 45 mL 3   Insulin Pen Needle (UNIFINE PENTIPS) 32G X 4 MM MISC Use with Levemir 100 each 0   Insulin Pen Needle 32G X 4 MM MISC 1 Device by Does not apply route in the morning, at noon, in the evening, and at bedtime. 400 each 3   nitroGLYCERIN (NITROSTAT) 0.4 MG SL tablet Place 1 tablet (0.4 mg total) under the tongue every 5 (five) minutes as needed for chest pain. 25 tablet 4   REPATHA SURECLICK 140 MG/ML SOAJ ADMINISTER 1 ML UNDER THE SKIN EVERY 14 DAYS. 6 mL 3  rOPINIRole (REQUIP) 1 MG tablet Take 1 tablet (1 mg total) by mouth daily. 90 tablet 3   Semaglutide, 1 MG/DOSE, 4 MG/3ML SOPN Inject 1 mg as directed once a week. 9 mL 3   triamcinolone cream (KENALOG) 0.1 % Apply 1 Application topically 2 (two) times daily. (Patient taking differently: Apply 1 Application topically 2 (two) times daily as needed (rash).) 30 g 0   vortioxetine HBr (TRINTELLIX) 20 MG TABS tablet Take 1 tablet (20 mg total) by mouth daily. 30 tablet 0   carvedilol (COREG) 25 MG tablet Take 1 tablet (25 mg total) by mouth 2 (two) times daily. 180 tablet 1   No current  facility-administered medications for this encounter.    Atrial Fibrillation Management history:  Previous antiarrhythmic drugs: none Previous cardioversions: none Previous ablations: 02/01/24 Anticoagulation history: Eliquis   ROS- All systems are reviewed and negative except as per the HPI above.  Physical Exam: BP (!) 150/84   Pulse 77   Ht 5\' 10"  (1.778 m)   Wt 94.1 kg   BMI 29.76 kg/m   GEN: Well nourished, well developed in no acute distress NECK: No JVD; No carotid bruits CARDIAC: Regular rate and rhythm, no murmurs, rubs, gallops RESPIRATORY:  Clear to auscultation without rales, wheezing or rhonchi  ABDOMEN: Soft, non-tender, non-distended EXTREMITIES:  No edema; No deformity   EKG today demonstrates  Vent. rate 77 BPM PR interval 162 ms QRS duration 86 ms QT/QTcB 398/450 ms P-R-T axes 52 101 21 Normal sinus rhythm Anterolateral infarct , age undetermined Abnormal ECG When compared with ECG of 01-Feb-2024 09:58, PREVIOUS ECG IS PRESENT  Echo 07/26/23 demonstrated  1. Left ventricular ejection fraction, by estimation, is 50 to 55%. The  left ventricle has low normal function. The left ventricle demonstrates  regional wall motion abnormalities with basal inferolateral and basal  anterolateral akinesis. There is mild  concentric left ventricular hypertrophy. Left ventricular diastolic  parameters are consistent with Grade I diastolic dysfunction (impaired  relaxation).   2. Right ventricular systolic function is normal. The right ventricular  size is normal. Tricuspid regurgitation signal is inadequate for assessing  PA pressure.   3. The mitral valve is normal in structure. Trivial mitral valve  regurgitation. No evidence of mitral stenosis.   4. The aortic valve is tricuspid. There is mild calcification of the  aortic valve. Aortic valve regurgitation is not visualized. No aortic  stenosis is present.   5. The inferior vena cava is dilated in size with  >50% respiratory  variability, suggesting right atrial pressure of 8 mmHg.   ASSESSMENT & PLAN CHA2DS2-VASc Score = 3  The patient's score is based upon: CHF History: 0 HTN History: 1 Diabetes History: 1 Stroke History: 0 Vascular Disease History: 1 Age Score: 0 Gender Score: 0       ASSESSMENT AND PLAN: Paroxysmal Atrial Fibrillation (ICD10:  I48.0) The patient's CHA2DS2-VASc score is 3, indicating a 3.2% annual risk of stroke.   S/p Afib ablation on 02/01/24 by Dr. Jimmey Ralph.  He is currently in NSR. He notes brief palpitations. Counseled patient on possibility for brief episodes of Afib during the recovery period.   Secondary Hypercoagulable State (ICD10:  D68.69) The patient is at significant risk for stroke/thromboembolism based upon his CHA2DS2-VASc Score of 3.  Continue Apixaban (Eliquis).  Continue Eliquis without interruption.  Hypertension Increase coreg to 25 mg BID.      Follow up as scheduled with EP.   Lake Bells, PA-C  [facility]  [facility] [address] [place], [place] [address] [phone]

## 2024-03-01 NOTE — Addendum Note (Signed)
 Addended by: Bufford Spikes on: 03/01/2024 12:34 PM   Modules accepted: Orders

## 2024-03-02 ENCOUNTER — Other Ambulatory Visit (HOSPITAL_COMMUNITY): Payer: Self-pay

## 2024-03-02 MED ORDER — VORTIOXETINE HBR 20 MG PO TABS
20.0000 mg | ORAL_TABLET | Freq: Every day | ORAL | 0 refills | Status: AC
Start: 2024-03-02 — End: ?
  Filled 2024-03-02: qty 30, 30d supply, fill #0

## 2024-03-03 ENCOUNTER — Encounter: Payer: Self-pay | Admitting: Pharmacist

## 2024-03-03 ENCOUNTER — Other Ambulatory Visit (HOSPITAL_COMMUNITY): Payer: Self-pay

## 2024-03-07 ENCOUNTER — Other Ambulatory Visit (HOSPITAL_COMMUNITY): Payer: Self-pay

## 2024-03-07 ENCOUNTER — Ambulatory Visit: Payer: Medicaid Other | Admitting: Neurology

## 2024-03-07 DIAGNOSIS — R519 Headache, unspecified: Secondary | ICD-10-CM

## 2024-03-07 DIAGNOSIS — J322 Chronic ethmoidal sinusitis: Secondary | ICD-10-CM

## 2024-03-07 DIAGNOSIS — J342 Deviated nasal septum: Secondary | ICD-10-CM

## 2024-03-07 DIAGNOSIS — Z9889 Other specified postprocedural states: Secondary | ICD-10-CM

## 2024-03-07 DIAGNOSIS — M2629 Other anomalies of dental arch relationship: Secondary | ICD-10-CM

## 2024-03-07 DIAGNOSIS — G4719 Other hypersomnia: Secondary | ICD-10-CM

## 2024-03-07 DIAGNOSIS — G4710 Hypersomnia, unspecified: Secondary | ICD-10-CM

## 2024-03-07 DIAGNOSIS — E11628 Type 2 diabetes mellitus with other skin complications: Secondary | ICD-10-CM

## 2024-03-07 DIAGNOSIS — I25810 Atherosclerosis of coronary artery bypass graft(s) without angina pectoris: Secondary | ICD-10-CM

## 2024-03-08 NOTE — Progress Notes (Signed)
 [facility]  [name] [name] 63 year old male June 10, 1961   HOME SLEEP TEST REPORT ( by Watch PAT)   STUDY DATE:   03-07-2024   ORDERING CLINICIAN:  REFERRING CLINICIAN:    CLINICAL INFORMATION/HISTORY: patient with history of CAD/ angioplasty, atrial fib and DM on Insulin. [name] [name] is a 63 y.o. male patient who is seen upon  [name] new referral on 02/04/2024 for a Sleep Medicine consultation.    Chief concern according to patient :  "I had a heart attack in July and that cause me to change my lifestyle, my diet. "   I have the pleasure of seeing [name] 02/04/24 a right -handed male with a possible sleep disorder.    The patient had the first sleep study in the year 2020 at [place] [facility] - AHI 14.3/h and he had already been treated on  BiPAP-  all hypopneas- not apneas-  habitual mouth breather ,  an oxygen saturation Nadir at SP02 86%. Total hypoxia time 25 minutes . He has trouble to breathe when sleeping flat .  History of mild OSA, hypopnea-  has a BIPAP but not used in 5 years. Excessive daytime  sleepiness, snoring,  mouth breathing -loud snoring Overbite .  status post UPPP>  nasal septal deviation, sinusitis -. Depression - morning headaches daily !       Sleep relevant medical history: UPPP/ Tonsillectomy, sinuitis,  congested, nasal congestion.  Nocturia/ Enuresis  1-2 , as investigated for  Parasomnia , had a Tonsillectomy,   repair/ UPPP? ENT cpnsult showed deviated septum. Chief complaint according to patient : I am soo sleepy, so very tired, HYPERSOMNIA . Sleep and medical history: [name] reports having been tested at  [facility] and later at [facility] (over 5 years ago ) - he remembers being diagnosed with 118 apneas per hour, and that he had his first sleep study taken in a seated position due to orthopnea. He started off on his CPAP could not tolerated was changed to a BiPAP and still could not  tolerate it.  His positive airway pressure intolerance was closely linked to a condition of chronic sinusitis, sinus congestion and a known seasonal pattern. He underwent a tonsillectomy but his sinus headaches responded, his nasal airflow was still restricted.  At the allergy and asthma center it was documented that he had a tonsillectomy and uvuloplasty, but he continues to have very poor quality sleep, that he is using fluticasone nasal spray and that he tested positive for mold and certain tree allergens.  His obstructive sleep apnea syndrome has been untreated for many years now.  He also has sacroiliitis, radiculopathy which was not further specified degeneration of the lumbar or lumbosacral intervertebral discs were documented. DM2, anxiety.     I will  first order a HST and if positive again, we should try for in lab  titration . [facility] Medicaid )  I will also refer to Dr Sampson Si.     Epworth sleepiness score: 19/ 24 points   FSS endorsed at 63/ 63 points.  13/ 15 on GDS (!!)    BMI: 30.4 kg/m   Neck Circumference: 18"    FINDINGS:   Sleep Summary:   Total Recording Time (hours, min): 8 hours 22 minutes      Total Sleep Time (hours, min):        7 hours 19 minutes  Percent REM (%):     9%                                   Respiratory Indices following AASM criteria:   Calculated pAHI (per hour):     19.2/h                        REM pAHI:    17.1/h                                             NREM pAHI:    19.4/h                          Positional  AHI: This patient slept the majority of the night in nonsupine position mostly in prone sleep position.  In prone sleep his AHI was 12.6/h right lateral sleep 12.7/h and in supine sleep position the AHI rose to 32.1/h.  Snoring reached a mean volume of 41 dB and would be considered moderate - snoring was present for 25% of the total sleep time                                                 Oxygen Saturation  Statistics:   Oxygen Saturation (%) Mean: 92%                    O2 Saturation Range (%):     Between 88% at nadir and the maximum saturation of 96%                                  O2 Saturation (minutes) <89%:   0 minutes        Pulse Rate Statistics:   Pulse Mean (bpm):     84 bpm            Pulse Range:     Between 72 and 114 bpm.  Please note that this home sleep test device cannot give cardiac rhythm information.            IMPRESSION:  This HST confirms the presence of moderate and all obstructive sleep apnea which was not REM sleep dependent and exacerbated in supine sleep position.  There was no associated hypoxemia   RECOMMENDATION: Based on these data the patient could be a candidate for CPAP, he could use a dental device to reduce his AHI by 50% or more and he could also be an inspire implant patient which can also help him to reduce his AHI by about 50% or more. In patients with a history of cardiac rhythm abnormalities we tend to recommend positive airway pressure above the other 2 alternative therapies.  I will be happy to provide either a sleep dentist referral or an ENT referral or a referral for an auto-titration CPAP device.    INTERPRETING PHYSICIAN:   Melvyn Novas, MD , Valinda Hoar

## 2024-03-13 ENCOUNTER — Encounter: Payer: Self-pay | Admitting: Neurology

## 2024-03-13 NOTE — Procedures (Signed)
 [facility]  [name] [name] 63 year old male June 10, 1961   HOME SLEEP TEST REPORT ( by Watch PAT)   STUDY DATE:   03-07-2024   ORDERING CLINICIAN:  REFERRING CLINICIAN:    CLINICAL INFORMATION/HISTORY: patient with history of CAD/ angioplasty, atrial fib and DM on Insulin. [name] [name] is a 63 y.o. male patient who is seen upon  [name] [name] new referral on 02/04/2024 for a Sleep Medicine consultation.    Chief concern according to patient :  "I had a heart attack in July and that cause me to change my lifestyle, my diet. "   I have the pleasure of seeing [name] [name] 02/04/24 a right -handed male with a possible sleep disorder.    The patient had the first sleep study in the year 2020 at [place] [facility] - AHI 14.3/h and he had already been treated on  BiPAP-  all hypopneas- not apneas-  habitual mouth breather ,  an oxygen saturation Nadir at SP02 86%. Total hypoxia time 25 minutes . He has trouble to breathe when sleeping flat .  History of mild OSA, hypopnea-  has a BIPAP but not used in 5 years. Excessive daytime  sleepiness, snoring,  mouth breathing -loud snoring Overbite .  status post UPPP>  nasal septal deviation, sinusitis -. Depression - morning headaches daily !       Sleep relevant medical history: UPPP/ Tonsillectomy, sinuitis,  congested, nasal congestion.  Nocturia/ Enuresis  1-2 , as investigated for  Parasomnia , had a Tonsillectomy,   repair/ UPPP? ENT cpnsult showed deviated septum. Chief complaint according to patient : I am soo sleepy, so very tired, HYPERSOMNIA . Sleep and medical history: [name] reports having been tested at  [facility] and later at [facility] (over 5 years ago ) - he remembers being diagnosed with 118 apneas per hour, and that he had his first sleep study taken in a seated position due to orthopnea. He started off on his CPAP could not tolerated was changed to a BiPAP and still could not  tolerate it.  His positive airway pressure intolerance was closely linked to a condition of chronic sinusitis, sinus congestion and a known seasonal pattern. He underwent a tonsillectomy but his sinus headaches responded, his nasal airflow was still restricted.  At the allergy and asthma center it was documented that he had a tonsillectomy and uvuloplasty, but he continues to have very poor quality sleep, that he is using fluticasone nasal spray and that he tested positive for mold and certain tree allergens.  His obstructive sleep apnea syndrome has been untreated for many years now.  He also has sacroiliitis, radiculopathy which was not further specified degeneration of the lumbar or lumbosacral intervertebral discs were documented. DM2, anxiety.     I will  first order a HST and if positive again, we should try for in lab  titration . [facility] Medicaid )  I will also refer to [name] [name].     Epworth sleepiness score: 19/ 24 points   FSS endorsed at 63/ 63 points.  13/ 15 on GDS (!!)    BMI: 30.4 kg/m   Neck Circumference: 18"    FINDINGS:   Sleep Summary:   Total Recording Time (hours, min): 8 hours 22 minutes      Total Sleep Time (hours, min):        7 hours 19 minutes  Percent REM (%):     9%                                   Respiratory Indices following AASM criteria:   Calculated pAHI (per hour):     19.2/h                        REM pAHI:    17.1/h                                             NREM pAHI:    19.4/h                          Positional  AHI: This patient slept the majority of the night in nonsupine position mostly in prone sleep position.  In prone sleep his AHI was 12.6/h right lateral sleep 12.7/h and in supine sleep position the AHI rose to 32.1/h.  Snoring reached a mean volume of 41 dB and would be considered moderate - snoring was present for 25% of the total sleep time                                                 Oxygen Saturation  Statistics:   Oxygen Saturation (%) Mean: 92%                    O2 Saturation Range (%):     Between 88% at nadir and the maximum saturation of 96%                                  O2 Saturation (minutes) <89%:   0 minutes        Pulse Rate Statistics:   Pulse Mean (bpm):     84 bpm            Pulse Range:     Between 72 and 114 bpm.  Please note that this home sleep test device cannot give cardiac rhythm information.            IMPRESSION:  This HST confirms the presence of moderate and all obstructive sleep apnea which was not REM sleep dependent and exacerbated in supine sleep position.  There was no associated hypoxemia   RECOMMENDATION: Based on these data the patient could be a candidate for CPAP, he could use a dental device to reduce his AHI by 50% or more and he could also be an inspire implant patient which can also help him to reduce his AHI by about 50% or more. In patients with a history of cardiac rhythm abnormalities we tend to recommend positive airway pressure above the other 2 alternative therapies.  I will be happy to provide either a sleep dentist referral or an ENT referral or a referral for an auto-titration CPAP device.    INTERPRETING PHYSICIAN:   [name] Novas, MD , Valinda Hoar

## 2024-03-14 ENCOUNTER — Encounter: Payer: Self-pay | Admitting: Pharmacist

## 2024-03-14 ENCOUNTER — Other Ambulatory Visit (HOSPITAL_COMMUNITY): Payer: Self-pay

## 2024-03-14 LAB — LIPID PANEL
Chol/HDL Ratio: 2.1 ratio (ref 0.0–5.0)
Cholesterol, Total: 53 mg/dL — ABNORMAL LOW (ref 100–199)
HDL: 25 mg/dL — ABNORMAL LOW (ref >39)
LDL Chol Calc (NIH): 10 mg/dL (ref 0–99)
Triglycerides: 86 mg/dL (ref 0–149)
VLDL Cholesterol Cal: 18 mg/dL (ref 5–40)

## 2024-03-14 MED ORDER — ATORVASTATIN CALCIUM 40 MG PO TABS: 40.0000 mg | ORAL_TABLET | Freq: Every day | ORAL | 3 refills | Status: DC

## 2024-03-14 MED ORDER — ATORVASTATIN CALCIUM 40 MG PO TABS
40.0000 mg | ORAL_TABLET | Freq: Every day | ORAL | 3 refills | Status: DC
Start: 2024-03-14 — End: 2024-03-14
  Filled 2024-03-14: qty 90, 90d supply, fill #0

## 2024-03-14 NOTE — Addendum Note (Signed)
 Addended by: Malena Peer D on: 03/14/2024 07:08 AM   Modules accepted: Orders

## 2024-03-16 ENCOUNTER — Telehealth: Payer: Self-pay | Admitting: Cardiovascular Disease

## 2024-03-16 NOTE — Telephone Encounter (Signed)
 Patient report he started having congestion a few weeks ago that now has turned into him having SOB with minimal activity and at rest. He feels extremely fatigued, stating he has slept more in the past 2 days than he has been awake.  Patient checked BP/HR during call: 124/74, HR 80.  Patient reports he almost passed out twice yesterday, stating he made it to his bed and laid down on it. Denies losing consciousness.  Scheduled appt with Francis Dowse tomorrow morning 3/21 at 10:05am for EKG and assessment of symptoms.  Advised patient on ED precautions.  Patient verbalized understanding of the above and confirmed appt date/time/location.  Will forward to Dr. Jimmey Ralph and his nurse to review.

## 2024-03-16 NOTE — Telephone Encounter (Signed)
 Pt c/o Shortness Of Breath: STAT if SOB developed within the last 24 hours or pt is noticeably SOB on the phone  1. Are you currently SOB (can you hear that pt is SOB on the phone)? yes  2. How long have you been experiencing SOB? 3 weeks  3. Are you SOB when sitting or when up moving around? both  4. Are you currently experiencing any other symptoms? Extreme fatigued , lightheaded, afib episodes

## 2024-03-17 ENCOUNTER — Ambulatory Visit: Admitting: Physician Assistant

## 2024-03-17 NOTE — Progress Notes (Deleted)
 Cardiology Office Note:  .   Date:  03/17/2024  ID:  [name] [name], DOB 02/27/1961, MRN [id number] PCP: Rocky Morel, DO  [facility] Providers Cardiologist:  Tonny Bollman, MD Electrophysiologist:  Nobie Putnam, MD {  History of Present Illness: .   Breylon Sherrow is a 63 y.o. male w/PMHx of  CAD  (Inf-Lat STEMI 06/2023 s/p 2.5 x 24 mm DES to dLCx, 3.5 x 16 mm DES to mLCx) [facility] 07/25/23: pLCx 80, mLCx 100, pLAD 70 (ulcerative plaque), RCA mild diffuse disease, EF 50-55) (11/10/23: Successful IVUS guided PCI of severe ulcerated stenosis in the proximal LAD, reducing 70% stenosis to 0% with a 4.0 x 16 mm Synergy DES) AFib HTN, HLD, DM, OSA  He saw Scott 11/02/23, several symptoms this visit, [palpitations, SOB/increased respiratory rate, high BP, profuse sweating, jaw pain (that he used NTG for), depression, lack of motivation Discussed monitor found <1% AF burden though felt his symptoms reflected AFib burden was much higher and referred to EP His coreg increased, PRN lopressor No changes to his CAD management > planned to discuss with Dr. Excell Seltzer perhaps further eval  >>>> planned for [facility] 11/10/23: Successful IVUS guided PCI of severe ulcerated stenosis in the proximal LAD, reducing 70% stenosis to 0% with a 4.0 x 16 mm Synergy DES  Recommendations: Continue clopidogrel without interruption for at least another 6 months. Resume apixaban tomorrow. No concomitant aspirin until clopidogrel discontinued. Anticipate hospital discharge tomorrow morning.   He saw dr. Jimmey Ralph 12/07/23, palpitations continued but less with BB, symptomatic, feeling poorly with them, planned for ablation  AFib ablation 02/01/24 > post procedure hematoma with [place] negative for pseudoaneurysm  Saw the AFib clinic 02/29/24, reported frequent headaches/elevated BPs, coreg was increased, groin was ok  Pt called yesterday c/o SOB, extreme fatigue, near syncope   Today's visit is scheduled for evaluation of  symptoms  ROS:   *** eliquis, dose, bleeding *** symptoms *** plavix  Arrhythmia/AAD hx AFib July 2024  Studies Reviewed: Marland Kitchen    EKG done today and reviewed by myself:  ***   02/01/24: EPS/ablation CONCLUSIONS: 1. Successful PVI 2. Successful ablation/isolation of the posterior wall 3. Intracardiac echo reveals obvious LA thrombus, preserved LV function, trivial pericardial effusion and 4 distinct PVs. 4. No early apparent complications. 5. Resume Eliquis in the recovery area.  Echo 07/26/23:  Low normal LV systolic function.  LVEF 50 to 55%.  Mild concentric LVH.  Grade 1 diastolic dysfunction. Normal RV size and function. No significant valvular disease. Left and right atrium are normal in size.   Zio Monitor 08/2023 Worn for only 3 days. 5 total AF episodes, longest lasting 20 minutes.   Risk Assessment/Calculations:    Physical Exam:   VS:  There were no vitals taken for this visit.   Wt Readings from Last 3 Encounters:  02/29/24 207 lb 6.4 oz (94.1 kg)  02/04/24 212 lb (96.2 kg)  02/01/24 200 lb (90.7 kg)    GEN: Well nourished, well developed in no acute distress NECK: No JVD; No carotid bruits CARDIAC: ***RRR, no murmurs, rubs, gallops RESPIRATORY:  *** CTA b/l without rales, wheezing or rhonchi  ABDOMEN: Soft, non-tender, non-distended EXTREMITIES: *** No edema; No deformity   ASSESSMENT AND PLAN: .    paroxysmal AFib CHA2DS2Vasc is 3, on Eliquis, *** appropriately dosed ***  CAD *** C/w Dr. Sondra Barges  HTN ***  Secondary hypercoagulable state 2/2 AFib     {Are you ordering a CV Procedure (e.g. stress test, cath,  DCCV, TEE, etc)?   Press F2        :[id number]}     Dispo: ***  Signed, Sheilah Pigeon, PA-C

## 2024-03-20 ENCOUNTER — Encounter: Payer: Self-pay | Admitting: Pulmonary Disease

## 2024-03-20 ENCOUNTER — Ambulatory Visit: Attending: Pulmonary Disease | Admitting: Pulmonary Disease

## 2024-03-20 VITALS — BP 108/70 | HR 80 | Ht 70.0 in | Wt 204.0 lb

## 2024-03-20 DIAGNOSIS — I10 Essential (primary) hypertension: Secondary | ICD-10-CM | POA: Diagnosis not present

## 2024-03-20 DIAGNOSIS — G4733 Obstructive sleep apnea (adult) (pediatric): Secondary | ICD-10-CM

## 2024-03-20 DIAGNOSIS — I480 Paroxysmal atrial fibrillation: Secondary | ICD-10-CM

## 2024-03-20 DIAGNOSIS — D6869 Other thrombophilia: Secondary | ICD-10-CM

## 2024-03-20 NOTE — Patient Instructions (Signed)
 Medication Instructions:  It is ok to take over the counter tylenol and delsym as needed. *If you need a refill on your cardiac medications before your next appointment, please call your pharmacy*  Lab Work: None ordered If you have labs (blood work) drawn today and your tests are completely normal, you will receive your results only by: MyChart Message (if you have MyChart) OR A paper copy in the mail If you have any lab test that is abnormal or we need to change your treatment, we will call you to review the results.  Follow-Up: At [facility], you and your health needs are our priority.  As part of our continuing mission to provide you with exceptional heart care, we have created designated Provider Care Teams.  These Care Teams include your primary Cardiologist (physician) and Advanced Practice Providers (APPs -  Physician Assistants and Nurse Practitioners) who all work together to provide you with the care you need, when you need it.  Your next appointment:   As scheduled  Provider:   Casimiro Needle "Mardelle Matte" [place], PA-C   Call and let us know if your shortness of breath has not resolved in 2-3 weeks.

## 2024-03-20 NOTE — Telephone Encounter (Signed)
 He rescheduled to see Merry Proud today

## 2024-03-20 NOTE — Progress Notes (Signed)
 Electrophysiology Office Note:   Date:  03/20/2024  ID:  [name] [name], DOB 1961-08-29, MRN [id number]  Primary Cardiologist: Tonny Bollman, MD Primary Heart Failure: None Electrophysiologist: Nobie Putnam, MD      History of Present Illness:   [name] [name] is a 63 y.o. male with h/o AF, HTN, CAD s/p STEMI with PCI, OSA on BiPAP, DM II, anxiety / depression seen today for acute complaints of cough, congestion, shortness of breath, fatigue.   The patient reports he developed a cough with nasal congestion approximately 2-3 weeks ago.  He felt as though he had the flu without fever.  He has coughed so much that his ribs are sore. He has had occasional sputum production that is green in color. He feels short of breath and fatigued.  "Just worn out, felt so bad I couldn't even shave". He has slept a lot in the last few days. He is frustrated at feeling bad this long.   He denies chest pain, palpitations, PND, orthopnea, nausea, vomiting, dizziness, syncope, edema, weight gain, or early satiety.   Review of systems complete and found to be negative unless listed in HPI.   EP Information / Studies Reviewed:    EKG is ordered today. Personal review as below.  EKG Interpretation Date/Time:  Monday March 20 2024 14:36:37 EDT Ventricular Rate:  80 PR Interval:  164 QRS Duration:  80 QT Interval:  384 QTC Calculation: 442 R Axis:   91  Text Interpretation: Normal sinus rhythm Rightward axis Confirmed by Canary Brim ([id number]) on 03/20/2024 3:33:54 PM   Studies:  ECHO 06/2023 > LVEF 50-55%, LV low normal function, mild concentric LVH, G1DD, trivial mitral valve regurgitation, mild calcification of the AV, no stenosis  EPS 02/01/24 > successful PVI, successful ablation / isolation of the posterior wall, ICE reveals no obvious LA thrombus, preserved LV function  Arrhythmia / AAD AF   Risk Assessment/Calculations:    CHA2DS2-VASc Score = 3   This indicates a 3.2% annual risk of  stroke. The patient's score is based upon: CHF History: 0 HTN History: 1 Diabetes History: 1 Stroke History: 0 Vascular Disease History: 1 Age Score: 0 Gender Score: 0             Physical Exam:   VS:  BP 108/70   Pulse 80   Ht 5\' 10"  (1.778 m)   Wt 204 lb (92.5 kg)   SpO2 90%   BMI 29.27 kg/m    Wt Readings from Last 3 Encounters:  03/20/24 204 lb (92.5 kg)  02/29/24 207 lb 6.4 oz (94.1 kg)  02/04/24 212 lb (96.2 kg)     GEN: Well nourished, well developed in no acute distress NECK: No JVD; No carotid bruits CARDIAC: Regular rate and rhythm, no murmurs, rubs, gallops RESPIRATORY:  course breath sounds bilaterally with rhonchi, harsh moist cough on inspiration for exam  ABDOMEN: Soft, non-tender, non-distended EXTREMITIES:  No edema; No deformity   ASSESSMENT AND PLAN:    Suspected Viral URI  Cough, Shortness of Breath  Right Sided Chest Discomfort  Too late in course to test for respiratory viruses  -pt has appt with his PCP in 2 days, encouraged him to keep and follow up regarding symptoms  -in the meantime, recommended he use tylenol for pain & Delsym for cough suppression  -discussed his suspected viral illness should run its course in the next couple of weeks. Post viral cough can last 4-6 weeks post infection.  If this persists, EP would need  to know of persistent shortness of breath (less likely PV stenosis post ablation )  Paroxysmal Atrial Fibrillation  CHA2DS2-VASc 3, s/p ablation 02/01/24  -EKG with NSR, no acute ST changes   -no formal monitoring method at home > usually feels the AF  -continue OAC for stroke prophylaxis > stressed importance of no missed doses of OAC  Secondary Hypercoagulable State  -continue Eliquis 5mg  BID, dose reviewed and appropriate by age/wt   Hypertension  -well controlled on current regimen   OSA  -CPAP compliance encouraged    Follow up with Dr. Jimmey Ralph  as planned for post ablation follow up    Signed, Canary Brim, NP-C, AGACNP-BC [facility] - Electrophysiology  03/20/2024, 5:52 PM

## 2024-03-30 ENCOUNTER — Encounter (INDEPENDENT_AMBULATORY_CARE_PROVIDER_SITE_OTHER): Payer: Self-pay | Admitting: Otolaryngology

## 2024-03-30 ENCOUNTER — Telehealth: Payer: Self-pay | Admitting: Internal Medicine

## 2024-03-30 ENCOUNTER — Ambulatory Visit (INDEPENDENT_AMBULATORY_CARE_PROVIDER_SITE_OTHER): Payer: Medicaid Other | Admitting: Otolaryngology

## 2024-03-30 VITALS — BP 119/80 | HR 82 | Ht 70.0 in | Wt 197.0 lb

## 2024-03-30 DIAGNOSIS — R0981 Nasal congestion: Secondary | ICD-10-CM | POA: Diagnosis not present

## 2024-03-30 DIAGNOSIS — J329 Chronic sinusitis, unspecified: Secondary | ICD-10-CM | POA: Diagnosis not present

## 2024-03-30 DIAGNOSIS — Z91198 Patient's noncompliance with other medical treatment and regimen for other reason: Secondary | ICD-10-CM

## 2024-03-30 DIAGNOSIS — R0982 Postnasal drip: Secondary | ICD-10-CM

## 2024-03-30 DIAGNOSIS — J3089 Other allergic rhinitis: Secondary | ICD-10-CM

## 2024-03-30 DIAGNOSIS — J342 Deviated nasal septum: Secondary | ICD-10-CM | POA: Diagnosis not present

## 2024-03-30 DIAGNOSIS — J3489 Other specified disorders of nose and nasal sinuses: Secondary | ICD-10-CM

## 2024-03-30 DIAGNOSIS — Z789 Other specified health status: Secondary | ICD-10-CM

## 2024-03-30 DIAGNOSIS — J343 Hypertrophy of nasal turbinates: Secondary | ICD-10-CM

## 2024-03-30 DIAGNOSIS — G4733 Obstructive sleep apnea (adult) (pediatric): Secondary | ICD-10-CM

## 2024-03-30 DIAGNOSIS — Z9889 Other specified postprocedural states: Secondary | ICD-10-CM

## 2024-03-30 MED ORDER — CETIRIZINE HCL 10 MG PO TABS: 10.0000 mg | ORAL_TABLET | Freq: Every day | ORAL | 11 refills | Status: DC

## 2024-03-30 MED ORDER — FLUTICASONE PROPIONATE 50 MCG/ACT NA SUSP: 2.0000 | Freq: Every day | NASAL | 6 refills | Status: DC

## 2024-03-30 MED ORDER — EMPAGLIFLOZIN 25 MG PO TABS
25.0000 mg | ORAL_TABLET | Freq: Every day | ORAL | 3 refills | Status: AC
Start: 2024-03-30 — End: ?

## 2024-03-30 NOTE — Telephone Encounter (Signed)
 Prescription resent again which is already for 90 day

## 2024-03-30 NOTE — Progress Notes (Signed)
 ENT CONSULT:  Reason for Consult: OSA CPAP intolerance  and chronic nasal congestion and hx of deviated septum  HPI: Discussed the use of AI scribe software for clinical note transcription with the patient, who gave verbal consent to proceed.  History of Present Illness [name] [name] is a 63 year old male with hx of moderate sleep apnea who presents with difficulty tolerating CPAP therapy and chronic nasal congestion/nasal obstruction. He was referred by his his neurologist at [facility] for evaluation of sleep apnea and difficulty tolerating CPAP therapy.  He has a history of sleep apnea diagnosed in the early 2000s and experiences difficulty tolerating CPAP therapy, primarily due to his sleeping position on his stomach, which affects the mask seal. He has tried various masks without success and is not currently using CPAP. He underwent tonsillectomy and uvulopalatopharyngoplasty, but this procedure did not resolve his sleep apnea.  He has a deviated septum and chronic nasal congestion, with both sides feeling clogged. He experiences constant "sinus" drainage and has been allergy tested, but allergy shots were ineffective (tried for 6 months without significant relief). He has tried Chief Financial Officer, which provide some relief but do not stop the postnasal drainage. No frequent sinus infections, but he recently had a brief episode of acute sinusitis with epistaxis.  He has a significant cardiac history, including a heart attack, three stents, and ablation surgery for atrial fibrillation within the past year. He is concerned about the cardiovascular risks associated with untreated sleep apnea. He is currently on Eliquis for atrial fibrillation. No history of insomnia.   BMI today 28.27  Records Reviewed:  [facility] Note from 03/07/24 ( home sleep study report) CLINICAL INFORMATION/HISTORY: patient with history of CAD/ angioplasty, atrial fib and DM on Insulin. [name] [name] is a  63 y.o. male patient who is seen upon  [name] new referral on 02/04/2024 for a Sleep Medicine consultation.    Chief concern according to patient :  "I had a heart attack in July and that cause me to change my lifestyle, my diet. "   I have the pleasure of seeing [name] [name] 02/04/24 a right -handed male with a possible sleep disorder.    The patient had the first sleep study in the year 2020 at [place] [facility] - AHI 14.3/h and he had already been treated on  BiPAP-  all hypopneas- not apneas-  habitual mouth breather ,  an oxygen saturation Nadir at SP02 86%. Total hypoxia time 25 minutes . He has trouble to breathe when sleeping flat .  History of mild OSA, hypopnea-  has a BIPAP but not used in 5 years. Excessive daytime  sleepiness, snoring,  mouth breathing -loud snoring Overbite .  status post UPPP>  nasal septal deviation, sinusitis -. Depression - morning headaches daily !       Sleep relevant medical history: UPPP/ Tonsillectomy, sinuitis,  congested, nasal congestion.  Nocturia/ Enuresis  1-2 , as investigated for  Parasomnia , had a Tonsillectomy,   repair/ UPPP? ENT cpnsult showed deviated septum. Chief complaint according to patient : I am soo sleepy, so very tired, HYPERSOMNIA . Sleep and medical history: [name] reports having been tested at  [facility] and later at [facility] center (over 5 years ago ) - he remembers being diagnosed with 118 apneas per hour, and that he had his first sleep study taken in a seated position due to orthopnea. He started off on his CPAP could  not tolerated was changed to a BiPAP and still could not tolerate it.  His positive airway pressure intolerance was closely linked to a condition of chronic sinusitis, sinus congestion and a known seasonal pattern. He underwent a tonsillectomy but his sinus headaches responded, his nasal airflow was still restricted.  At the allergy and asthma center it was documented that he  had a tonsillectomy and uvuloplasty, but he continues to have very poor quality sleep, that he is using fluticasone nasal spray and that he tested positive for mold and certain tree allergens.  His obstructive sleep apnea syndrome has been untreated for many years now.  He also has sacroiliitis, radiculopathy which was not further specified degeneration of the lumbar or lumbosacral intervertebral discs were documented. DM2, anxiety.      I will  first order a HST and if positive again, we should try for in lab  titration . I will also refer to Dr Sampson Si.   Epworth sleepiness score: 19/ 24 points   FSS endorsed at 63/ 63 points.  13/ 15 on GDS (!!)    BMI: 30.4 kg/m   Neck Circumference: 18"    Sleep Summary:   Total Recording Time (hours, min): 8 hours 22 minutes      Total Sleep Time (hours, min):        7 hours 19 minutes         Percent REM (%):     9%                                   Respiratory Indices following AASM criteria:   Calculated pAHI (per hour):     19.2/h                                   IMPRESSION:  This HST confirms the presence of moderate and all obstructive sleep apnea which was not REM sleep dependent and exacerbated in supine sleep position.  There was no associated hypoxemia   RECOMMENDATION: Based on these data the patient could be a candidate for CPAP, he could use a dental device to reduce his AHI by 50% or more and he could also be an inspire implant patient which can also help him to reduce his AHI by about 50% or more. In patients with a history of cardiac rhythm abnormalities we tend to recommend positive airway pressure above the other 2 alternative therapies.   I will be happy to provide either a sleep dentist referral or an ENT referral or a referral for an auto-titration CPAP device.   In-lab sleep study 07/31/19 Expanded EEG/ Parasomnia Montage Polysomnogram  HISTORY:   [name] is a 63 y.o. Caucasian male patient  who was seen  here on 05-11-2019 upon referral from Dr. Wyline Mood  (Allergy and Asthma) for a sleep apnea re- evaluation in the  setting of excessive daytime sleepiness. The patient is  interested in an [place] procedure.   The patient endorsed the Epworth Sleepiness Scale at 15 points.   The patient's weight 227 pounds with a height of 70 (inches),  resulting in a BMI of 32.5 kg/m2.  The patient's neck circumference measured 18 inches.   CURRENT MEDICATIONS: Lipitor, Sinequan, Cymbalta, Jardiance,  Apresoline, Lantus, Zestril, Flomax, Verapamil     1. Mild to moderate Obstructive Sleep Apnea (OSA) at AHI 14.3  with a total of 72 events. Very unusual distribution, mostly  sparing REM sleep.  2. Severe Periodic Limb Movement Disorder (PLMD), causing many  arousals and fragmenting sleep, but not seen in REM sleep.  3. Primary Snoring and Bruxism.  4. Prolonged REM sleep latency with rebounding REM sleep in the  last 1.5 hours of sleep.      Past Medical History:  Diagnosis Date   Anxiety    Coronary artery disease    Depression    Diabetes mellitus    Type 2   Enlarged prostate    Environmental allergies    Environmental and seasonal allergies    GERD (gastroesophageal reflux disease)    Headache(784.0)    sinus and migraines   Hyperlipidemia    Hypertension    IBS (irritable bowel syndrome)    Low testosterone    S/P angioplasty with stent to distal LCX and mid LCX both DES 07/25/23 07/26/2023   Sleep apnea    has used BiPAP in past; last sleep study >10 years    Past Surgical History:  Procedure Laterality Date   ATRIAL FIBRILLATION ABLATION N/A 02/01/2024   Procedure: ATRIAL FIBRILLATION ABLATION;  Surgeon: Nobie Putnam, MD;  Location: [facility] INVASIVE CV LAB;  Service: Cardiovascular;  Laterality: N/A;   COLONOSCOPY  09/2018   normal, repeat in 10 years per patient.  [place], [place]   CORONARY STENT INTERVENTION N/A 11/10/2023   Procedure: CORONARY STENT INTERVENTION;  Surgeon: Tonny Bollman, MD;  Location[facility] INVASIVE CV LAB;  Service: Cardiovascular;  Laterality: N/A;   CORONARY ULTRASOUND/IVUS N/A 11/10/2023   Procedure: Coronary Ultrasound/IVUS;  Surgeon: Tonny Bollman, MD;  Location: [facility] INVASIVE CV LAB;  Service: Cardiovascular;  Laterality: N/A;   CORONARY/GRAFT ACUTE MI REVASCULARIZATION N/A 07/25/2023   Procedure: Coronary/Graft Acute MI Revascularization;  Surgeon: Tonny Bollman, MD;  Location: [facility] INVASIVE CV LAB;  Service: Cardiovascular;  Laterality: N/A;   KNEE ARTHROSCOPY Left    LEFT HEART CATH AND CORONARY ANGIOGRAPHY N/A 07/25/2023   Procedure: LEFT HEART CATH AND CORONARY ANGIOGRAPHY;  Surgeon: Tonny Bollman, MD;  Location: [facility] INVASIVE CV LAB;  Service: Cardiovascular;  Laterality: N/A;   RENAL ANGIOGRAPHY N/A 07/16/2020   Procedure: RENAL ANGIOGRAPHY;  Surgeon: Yates Decamp, MD;  Location: [facility] INVASIVE CV LAB;  Service: Cardiovascular;  Laterality: N/A;   SACROILIAC JOINT FUSION Right 05/02/2014   Procedure: SACROILIAC JOINT FUSION;  Surgeon: Emilee Hero, MD;  Location: [facility] OR;  Service: Orthopedics;  Laterality: Right;  Right sided sacroiliac joint fusion   TONSILLECTOMY     uvula removed    Family History  Problem Relation Age of Onset   Cancer Mother        lung   Cancer Father        prostate, mets to bone, lung   Cancer Brother        melanoma   Heart disease Maternal Grandmother        MI   Lung disease Maternal Grandfather        black lung   Heart disease Paternal Grandmother    Stroke Paternal Grandmother    Heart disease Paternal Grandfather    Heart disease Brother 97       MI   Diabetes Brother     Social History:  reports that he quit smoking about 31 years ago. His smoking use included cigarettes. He started smoking about 41 years ago. He has a 10 pack-year smoking history. He has never used smokeless tobacco. He  reports that he does not drink alcohol and does not use drugs.  Allergies:  Allergies  Allergen Reactions    Pravastatin Other (See Comments)    Joint myalgias   Trazodone And Nefazodone     Had restless legs   Ezetimibe Other (See Comments)    Myalgias, joint pain   Hydrochlorothiazide Other (See Comments)    lightheadedness   Latex Rash   Metformin And Related Diarrhea    Medications: I have reviewed the patient's current medications.  The PMH, PSH, Medications, Allergies, and SH were reviewed and updated.  ROS: Constitutional: Negative for fever, weight loss and weight gain. Cardiovascular: Negative for chest pain and dyspnea on exertion. Respiratory: Is not experiencing shortness of breath at rest. Gastrointestinal: Negative for nausea and vomiting. Neurological: Negative for headaches. Psychiatric: The patient is not nervous/anxious  Blood pressure 119/80, pulse 82, height 5\' 10"  (1.778 m), weight 197 lb (89.4 kg), SpO2 93%. Body mass index is 28.27 kg/m.  PHYSICAL EXAM:  Exam: General: Well-developed, well-nourished Respiratory Respiratory effort: Equal inspiration and expiration without stridor Cardiovascular Peripheral Vascular: Warm extremities with equal color/perfusion Eyes: No nystagmus with equal extraocular motion bilaterally Neuro/Psych/Balance: Patient oriented to person, place, and time; Appropriate mood and affect; Gait is intact with no imbalance; Cranial nerves I-XII are intact Head and Face Inspection: Normocephalic and atraumatic without mass or lesion Palpation: Facial skeleton intact without bony stepoffs Salivary Glands: No mass or tenderness Facial Strength: Facial motility symmetric and full bilaterally ENT Pinna: External ear intact and fully developed External canal: Canal is patent with intact skin Tympanic Membrane: Clear and mobile External Nose: No scar or anatomic deformity Internal Nose: Septum is deviated to the right (with narrowing no the right > left). No polyp, or purulence. Mucosal edema and erythema present.  Bilateral inferior  turbinate hypertrophy.  Lips, Teeth, and gums: Mucosa and teeth intact and viable TMJ: No pain to palpation with full mobility Oral cavity/oropharynx: No erythema or exudate, no lesions present Friedman III tongue position s/p UPPP Nasopharynx: No mass or lesion with intact mucosa Hypopharynx: Intact mucosa without pooling of secretions Larynx Glottic: Full true vocal cord mobility without lesion or mass Supraglottic: Normal appearing epiglottis and AE folds Interarytenoid Space: Moderate pachydermia&edema Subglottic Space: Patent without lesion or edema Neck Neck and Trachea: Midline trachea without mass or lesion Thyroid: No mass or nodularity Lymphatics: No lymphadenopathy  Procedure: Preoperative diagnosis: OSA CPAP intolerance   Postoperative diagnosis:   Same  Procedure: Flexible fiberoptic laryngoscopy  Surgeon: Ashok Croon, MD  Anesthesia: Topical lidocaine and Afrin Complications: None Condition is stable throughout exam  Indications and consent:  The patient presents to the clinic with above symptoms. Indirect laryngoscopy view was incomplete. Thus it was recommended that they undergo a flexible fiberoptic laryngoscopy. All of the risks, benefits, and potential complications were reviewed with the patient preoperatively and verbal informed consent was obtained.  Procedure: The patient was seated upright in the clinic. Topical lidocaine and Afrin were applied to the nasal cavity. After adequate anesthesia had occurred, I then proceeded to pass the flexible telescope into the nasal cavity. The nasal cavity was patent without rhinorrhea or polyp. The nasopharynx was also patent without mass or lesion. The base of tongue was visualized and was normal. There were no signs of pooling of secretions in the piriform sinuses. The true vocal folds were mobile bilaterally. There were no signs of glottic or supraglottic mucosal lesion or mass. There was moderate interarytenoid  pachydermia and post cricoid  edema. The telescope was then slowly withdrawn and the patient tolerated the procedure throughout.    PROCEDURE NOTE: nasal endoscopy  Preoperative diagnosis: chronic sinusitis symptoms  Postoperative diagnosis: Septal deviation and ITH/mucosal edema  Procedure: Diagnostic nasal endoscopy (29562)  Surgeon: Ashok Croon, M.D.  Anesthesia: Topical lidocaine and Afrin  H&P REVIEW: The patient's history and physical were reviewed today prior to procedure. All medications were reviewed and updated as well. Complications: None Condition is stable throughout exam Indications and consent: The patient presents with symptoms of chronic sinusitis not responding to previous therapies. All the risks, benefits, and potential complications were reviewed with the patient preoperatively and informed consent was obtained. The time out was completed with confirmation of the correct procedure.   Procedure: The patient was seated upright in the clinic. Topical lidocaine and Afrin were applied to the nasal cavity. After adequate anesthesia had occurred, the rigid nasal endoscope was passed into the nasal cavity. The nasal mucosa, turbinates, septum, and sinus drainage pathways were visualized bilaterally. This revealed no purulence or significant secretions that might be cultured. There were no polyps or sites of significant inflammation. The mucosa was intact and there was no crusting present. The scope was then slowly withdrawn and the patient tolerated the procedure well. There were no complications or blood loss.   Studies Reviewed: HST done on 03/07/24 ESS 19/24 BMI 30.4 AHI 19.2 (3%) NO central apneas, no significant hypoxia   Allergy Testing     Assessment/Plan: Encounter Diagnoses  Name Primary?   Obstructive sleep apnea Yes   Chronic sinusitis, unspecified location    S/P UPPP (uvulopalatopharyngoplasty)    Intolerance of continuous positive airway pressure  (CPAP) ventilation    Environmental and seasonal allergies    Nasal septal deviation    Nasal obstruction    Post-nasal drip    Hypertrophy of both inferior nasal turbinates    Chronic nasal congestion     Assessment and Plan Assessment & Plan Obstructive Sleep Apnea (OSA) CPAP intolerance  OSA with intolerance to CPAP and ineffective past surgery (UPPP). Concern for cardiovascular risks of untreated OSA 2/2 hx of Afib and CAD, cardiac stents. Inspire therapy considered due to high success rate for moderate to severe OSA. BMI 28.2 today. HST 02/2024 with AHI of 19.2.   OSA, moderate to severe, without multilevel collapse, with failure to tolerate PAP therapy and/or more conservative measures. Presence of smaller/absent tonsils and larger tongue position (Friedman tongue position or modified Mallampati) suggests that hypopharyngeal/retrolingual collapse is contributing to the patient's OSA. Zachery Conch, M et al. Staging of obstructive sleep apnea/hypopnea syndrome: a guide to appropriate treatment. Laryngoscope, 2004 Mar, 114(3):454-9. PMID: [id number]) Options including positional therapy, weight loss, oral appliances, PAP and surgical correction discussed. Pt is not an ideal candidate for oral appliance due to severity of OSA Pt could be a candidate for Hypoglossal nerve stimulation (Inspire therapy) pending DISE   - Schedule drug-induced sleep endoscopy to assess airway collapse. - Discussed Inspire therapy as a treatment option. - Contact cardiologist for surgical clearance if Inspire therapy is pursued. Hx of Afib CAD on Eliquis (cardiac stents)  Deviated Nasal Septum/ITH  Deviated septum causing nasal obstruction, potentially affecting CPAP tolerance. Septoplasty may aid with CPAP tolerance but will not cure for OSA. Reports chronic sinusitis sx. No pus or purulence on exam today including nasal endoscopy. - Order sinus CT scan to assess for chronic sinus inflammation. - Prescribe Flonase  for nasal congestion. - Recommend nasal saline rinses. - Discuss potential septoplasty  if medical management is insufficient.  Chronic Nasal Congestion Chronic nasal congestion unresponsive to allergy treatments. Persistent bilateral nasal obstruction. Combination therapy advised. - Prescribe Zyrtec for allergy management. - Recommend combination therapy with Flonase and nasal saline rinses + Flonase BID. - CT sinuses to rule out chronic sinus inflammation   Thank you for allowing me to participate in the care of this patient. Please do not hesitate to contact me with any questions or concerns.   Ashok Croon, MD Otolaryngology [facility] Phone: [phone] Fax: [phone]    03/30/2024, 6:35 PM

## 2024-03-30 NOTE — Patient Instructions (Addendum)
 [email or web address]  This is the link to find a patient who has Inspire Implant to learn more about their experience  Lloyd Huger Med Nasal Saline Rinse   - start nasal saline rinses with [employer] Bottle available over the counter or online to help with nasal congestion

## 2024-03-30 NOTE — Telephone Encounter (Signed)
 MEDICATION: empagliflozin empagliflozin (JARDIANCE) 25 MG TABS tablet  PHARMACY:    [id number] - [place], [place] - [address] AT [facility] OF [address] (Ph: [phone])    HAS THE PATIENT CONTACTED THEIR PHARMACY?  Yes  IS THIS A 90 DAY SUPPLY : Yes  IS PATIENT OUT OF MEDICATION: No  IF NOT; HOW MUCH IS LEFT: 2 Days  LAST APPOINTMENT DATE: @1 /29/2025  NEXT APPOINTMENT DATE:@5 /29/2025  DO WE HAVE YOUR PERMISSION TO LEAVE A DETAILED MESSAGE?:Yes  OTHER COMMENTS: Patient states he would like prescription changed to a 90 day supply.  He states that the pharmacy did not change at last request.   **Let patient know to contact pharmacy at the end of the day to make sure medication is ready. **  ** Please notify patient to allow 48-72 hours to process**  **Encourage patient to contact the pharmacy for refills or they can request refills through [facility]**

## 2024-03-31 ENCOUNTER — Telehealth: Payer: Self-pay

## 2024-03-31 NOTE — Telephone Encounter (Signed)
 Left message for the surgeon's office if Elquis will need to be held along with Plavix. If so also neither Plavix nor Eliquis can be held until after 05/09/24.    I will send these notes to surgeon office as FYI and please reply if Eliquis will need to be held as well.

## 2024-03-31 NOTE — Telephone Encounter (Signed)
   Pre-operative Risk Assessment    Patient Name: [name] [name]  DOB: Jan 02, 1961 MRN: [id number]   Date of last office visit: 03/20/24 Date of next office visit: 05/02/24   Request for Surgical Clearance    Procedure:   Inspire Implant  Date of Surgery:  Clearance TBD                                Surgeon:  Not listed Surgeon's Group or Practice Name:  [facility] Phone number:  [phone] Fax number:  [phone]   Type of Clearance Requested:   - Medical  - Pharmacy:  Hold Clopidogrel (Plavix)     Type of Anesthesia:  General    Additional requests/questions:    Garrel Ridgel   03/31/2024, 12:42 PM

## 2024-03-31 NOTE — Telephone Encounter (Signed)
 Pre-op Team,  Please clarify medications to hold with requesting office. They are requesting hold of Plavix. Patient is on Eliquis as well.  Patient will not be able to hold either of these medications prior to 05/09/2024.   Thank you!   DW

## 2024-04-03 NOTE — Telephone Encounter (Signed)
 Patient underwent PCI in November 2024.  He should complete 6 months of uninterrupted clopidogrel before undergoing any elective surgical procedure.  Once he reaches 6 months out from his procedure, he can hold clopidogrel for 5 days before surgery as needed.  After surgery it would be reasonable to start on aspirin 81 mg daily as he will have completed his full course of clopidogrel.  Please let me know if any questions or concerns.  Thanks

## 2024-04-03 NOTE — Telephone Encounter (Signed)
 Please advise holding Eliquis prior to inspire implant. Looks like it cannot be done prior to 5/4 secondary to ablation in February.   Thank you!  DW

## 2024-04-03 NOTE — Telephone Encounter (Signed)
 Dr. Excell Seltzer,   This patient underwent PCI with DES to proximal LAD on 11/10/2023. You recommended Plavix without interruption x 6 months in addition to Eliquis. Per office protocol, will you please recommendations for holding Plavix prior to [facility] implantation?  Please route your response to P CV DIV Preop. I will communicate with requesting office once you have given recommendations.   Thank you!  Carlos Levering, NP

## 2024-04-04 ENCOUNTER — Telehealth (INDEPENDENT_AMBULATORY_CARE_PROVIDER_SITE_OTHER): Payer: Self-pay | Admitting: Otolaryngology

## 2024-04-04 NOTE — Telephone Encounter (Signed)
 Pt returning call about rescheduling surgery. R/S for 5/14

## 2024-04-04 NOTE — Telephone Encounter (Signed)
 [name] [name] calling back because she received a call about holding medications. Please give her a call back at [phone]

## 2024-04-04 NOTE — Telephone Encounter (Signed)
 Patient with diagnosis of afib on Eliquis for anticoagulation.    Procedure:  Inspire Implant  Date of procedure: TBD   CHA2DS2-VASc Score = 3   This indicates a 3.2% annual risk of stroke. The patient's score is based upon: CHF History: 0 HTN History: 1 Diabetes History: 1 Stroke History: 0 Vascular Disease History: 1 Age Score: 0 Gender Score: 0       s/p ablation 02/01/24    CrCl 105 ml/min Platelet count 219  Per office protocol, patient can hold Eliquis for 2 days prior to procedure.    Patient may not hold Eliquis prior to 5/5 and clopidogrel prior to 5/13- therefore surgery must be scheduled no sooner than 5/19.   **This guidance is not considered finalized until pre-operative APP has relayed final recommendations.**

## 2024-04-04 NOTE — Progress Notes (Signed)
 Patient with hx A-fib ablation on 02-01-24 and had STEMI on 06-2023 with 2 stents placed. He developed chest pain and had 3rd stent placed on 10-2023. This patient is on dual antiplatlet therapy at present time and it was recommended by Dr Excell Seltzer that the patient wait at least 6mos before undergoing any elective surgeries. Kaleen Odea at Dr Leighton Roach office made aware.

## 2024-04-04 NOTE — Telephone Encounter (Signed)
 Pt is calling back to find out why we are not clearing him for surgery. Please call him at [phone]

## 2024-04-04 NOTE — Telephone Encounter (Signed)
   Name: [name] [name]  DOB: 07-Oct-1961  MRN: [id number]  Primary Cardiologist: Tonny Bollman, MD  Chart reviewed as part of pre-operative protocol coverage. The patient has an upcoming visit scheduled with Maxine Glenn, PA on 05/02/2024 at which time clearance can be addressed in case there are any issues that would impact surgical recommendations.  Inspire Implant Is not scheduled until TBD as below. I added preop FYI to appointment note so that provider is aware to address at time of outpatient visit.  Per office protocol the cardiology provider should forward their finalized clearance decision and recommendations regarding antiplatelet therapy to the requesting party below.    Per office protocol, patient can hold Eliquis for 2 days prior to procedure.    Per Dr. Excell Seltzer: He may hold Plavix for 5 days prior to procedure. Please resume Plavix as soon as possible postprocedure, at the discretion of the surgeon.     Patient may not hold Eliquis prior to 5/5 and clopidogrel prior to 5/13- therefore surgery must be scheduled no sooner than 5/19.    I will route this message as FYI to requesting party and remove this message from the preop box as separate preop APP input not needed at this time.   Please call with any questions.  Denyce Robert, NP  04/04/2024, 4:35 PM

## 2024-04-04 NOTE — Telephone Encounter (Signed)
 Left voicemail needing to reschedule surgery per anesthesia.

## 2024-04-04 NOTE — Telephone Encounter (Addendum)
 I called the pt back and explained to him why Dr. Excell Seltzer has not cleared him at this point. Pt thanked me for the help and taking the time to explain this all to him. Pt also states that he has double pneumonia and has has x 6 weeks. He stated PCP gave him an ABX but it did not help. He went to a walk in clinic Sunday 04/02/24 and was given a different ABX which is also not working. Pt asked what should he do. I suggested that he call his PCP back tomorrow and discuss further about the Tx for the pneumonia. Pt thanked me for the call and all of the help.   Pt also had a question is Dr. Excell Seltzer going to let him come off Eliquis as well as the Plavix. I stated I can send Dr. Excell Seltzer a note about the Eliquis. Pt said no need that he will d/w Otilio Saber, [facility] on 05/02/24, but thanked me for the help again.  I will update all parties involved.

## 2024-04-04 NOTE — Telephone Encounter (Signed)
 I s/w Tammy with Dr. Irene Pap office in regard to clearance for the pt. I read Dr. Earmon Phoenix notes per betamin pt will not be able to have procedure until he completes 6 months from his PCI 11/10/23 before he can hold his Plavix. Pt has appt 05/02/24 with Otilio Saber, [facility] which preop clearance can be discussed at the appt 05/02/24.   I assured Tammy that I will fax notes to as FYI to their office so that Dr. Irene Pap may review.

## 2024-04-05 ENCOUNTER — Emergency Department (HOSPITAL_COMMUNITY)

## 2024-04-05 ENCOUNTER — Telehealth (INDEPENDENT_AMBULATORY_CARE_PROVIDER_SITE_OTHER): Payer: Self-pay

## 2024-04-05 ENCOUNTER — Other Ambulatory Visit: Payer: Self-pay

## 2024-04-05 ENCOUNTER — Emergency Department (HOSPITAL_COMMUNITY)
Admission: EM | Admit: 2024-04-05 | Discharge: 2024-04-05 | Disposition: A | Discharge status: Home / Self Care | Attending: Emergency Medicine | Admitting: Emergency Medicine

## 2024-04-05 DIAGNOSIS — J189 Pneumonia, unspecified organism: Secondary | ICD-10-CM | POA: Diagnosis not present

## 2024-04-05 DIAGNOSIS — Z91040 Latex allergy status: Secondary | ICD-10-CM | POA: Diagnosis not present

## 2024-04-05 DIAGNOSIS — Z7902 Long term (current) use of antithrombotics/antiplatelets: Secondary | ICD-10-CM | POA: Diagnosis not present

## 2024-04-05 DIAGNOSIS — Z7901 Long term (current) use of anticoagulants: Secondary | ICD-10-CM | POA: Diagnosis not present

## 2024-04-05 DIAGNOSIS — R059 Cough, unspecified: Secondary | ICD-10-CM | POA: Diagnosis present

## 2024-04-05 DIAGNOSIS — F17210 Nicotine dependence, cigarettes, uncomplicated: Secondary | ICD-10-CM | POA: Insufficient documentation

## 2024-04-05 DIAGNOSIS — Z794 Long term (current) use of insulin: Secondary | ICD-10-CM | POA: Insufficient documentation

## 2024-04-05 LAB — BASIC METABOLIC PANEL WITH GFR
Anion gap: 10 (ref 5–15)
BUN: 17 mg/dL (ref 6–20)
CO2: 27 mmol/L (ref 22–32)
Calcium: 9.5 mg/dL (ref 8.9–10.3)
Chloride: 103 mmol/L (ref 98–111)
Creatinine, Ser: 1.22 mg/dL (ref 0.61–1.24)
GFR, Estimated: >60 mL/min (ref >60)
Glucose, Bld: 97 mg/dL (ref 70–99)
Potassium: 3.9 mmol/L (ref 3.5–5.1)
Sodium: 140 mmol/L (ref 135–145)

## 2024-04-05 LAB — CBC
HCT: 50.9 % (ref 39.0–52.0)
Hemoglobin: 16.2 g/dL (ref 13.0–17.0)
MCH: 29.3 pg (ref 26.0–34.0)
MCHC: 31.8 g/dL (ref 30.0–36.0)
MCV: 92 fL (ref 80.0–100.0)
Platelets: 283 10*3/uL (ref 150–400)
RBC: 5.53 MIL/uL (ref 4.22–5.81)
RDW: 13.1 % (ref 11.5–15.5)
WBC: 14.4 10*3/uL — ABNORMAL HIGH (ref 4.0–10.5)
nRBC: 0 % (ref 0.0–0.2)

## 2024-04-05 LAB — RESP PANEL BY RT-PCR (RSV, FLU A&B, COVID)  RVPGX2
Influenza A by PCR: NEGATIVE
Influenza B by PCR: NEGATIVE
Resp Syncytial Virus by PCR: NEGATIVE
SARS Coronavirus 2 by RT PCR: NEGATIVE

## 2024-04-05 MED ORDER — IOHEXOL 350 MG/ML SOLN
50.0000 mL | Freq: Once | INTRAVENOUS | Status: AC | PRN
Start: 2024-04-05 — End: 2024-04-05
  Administered 2024-04-05: 50 mL via INTRAVENOUS

## 2024-04-05 MED ORDER — LEVOFLOXACIN 750 MG PO TABS: 750.0000 mg | ORAL_TABLET | Freq: Every day | ORAL | 0 refills | Status: DC

## 2024-04-05 MED ORDER — LEVOFLOXACIN 750 MG PO TABS
750.0000 mg | ORAL_TABLET | Freq: Once | ORAL | Status: AC
  Administered 2024-04-05: 750 mg via ORAL
  Filled 2024-04-05: qty 1

## 2024-04-05 NOTE — ED Triage Notes (Signed)
 Pt was dx with pneumonia on Sunday and has been on antibiotics but feels like he is not getting better.  Still has sob and productive cough with light green phlegm.  Spo2 95% on RA in triage.  No fever.

## 2024-04-05 NOTE — ED Provider Notes (Signed)
 [name] EMERGENCY DEPARTMENT AT [facility] Provider Note   CSN: [id number] Arrival date & time: 04/05/24  1115     History  Chief Complaint  Patient presents with   Pneumonia    [name] [name] is a 63 y.o. male.  This is a 63 year old male who is here today for 6 weeks of cough.  He has been treated for pneumonia with 2 rounds of antibiotics, and reports that his symptoms have not improved.  He denies any fever or chills.  Patient says he is lost weight because he has not felt well due to all the coughing.  Patient smokes cigarettes from 1980 until 1993.  He takes Eliquis for atrial fibrillation.   Pneumonia       Home Medications Prior to Admission medications   Medication Sig Start Date End Date Taking? Authorizing Provider  levofloxacin (LEVAQUIN) 750 MG tablet Take 1 tablet (750 mg total) by mouth daily. 04/05/24  Yes Anders Simmonds T, DO  acetaminophen (TYLENOL) 500 MG tablet Take 2 tablets (1,000 mg total) by mouth every 6 (six) hours as needed for moderate pain (pain score 4-6). Patient taking differently: Take 1,000 mg by mouth as needed for moderate pain (pain score 4-6). 12/24/23   Rocky Morel, DO  apixaban (ELIQUIS) 5 MG TABS tablet Take 1 tablet (5 mg total) by mouth 2 (two) times daily. 10/07/23 10/06/24  Mercie Eon, MD  atorvastatin (LIPITOR) 40 MG tablet Take 1 tablet (40 mg total) by mouth daily. 03/14/24 06/12/24  Tonny Bollman, MD  carvedilol (COREG) 25 MG tablet Take 1 tablet (25 mg total) by mouth 2 (two) times daily. 02/29/24 05/29/24  Eustace Pen, PA-C  cetirizine (ZYRTEC) 10 MG tablet Take 1 tablet (10 mg total) by mouth daily. 03/30/24   Ashok Croon, MD  clopidogrel (PLAVIX) 75 MG tablet Take 1 tablet (75 mg total) by mouth daily. 10/07/23 10/06/24  Mercie Eon, MD  Continuous Glucose Sensor (DEXCOM G7 SENSOR) MISC Change sensor every 10 days 02/07/24   Shamleffer, Konrad Dolores, MD  empagliflozin (JARDIANCE) 25 MG TABS  tablet Take 1 tablet (25 mg total) by mouth daily before breakfast. 03/30/24   Shamleffer, Konrad Dolores, MD  FIBER GUMMIES PO Take 2 capsules by mouth daily.    [provider]  fluticasone (FLONASE) 50 MCG/ACT nasal spray Place 2 sprays into both nostrils daily. 03/30/24   Ashok Croon, MD  insulin glargine (LANTUS SOLOSTAR) 100 UNIT/ML Solostar Pen Inject 60 Units into the skin daily. Patient taking differently: Inject 70 Units into the skin daily. 01/26/24   Shamleffer, Konrad Dolores, MD  insulin lispro (HUMALOG KWIKPEN) 200 UNIT/ML KwikPen Max daily 100 units Patient taking differently: Inject 40 Units into the skin 2 (two) times daily with a meal. May inject a third 40 unit dose as needed for high blood sugar 01/26/24   Shamleffer, Konrad Dolores, MD  Insulin Pen Needle (UNIFINE PENTIPS) 32G X 4 MM MISC Use with Levemir 01/19/24   Rocky Morel, DO  Insulin Pen Needle 32G X 4 MM MISC 1 Device by Does not apply route in the morning, at noon, in the evening, and at bedtime. 01/26/24   Shamleffer, Konrad Dolores, MD  nitroGLYCERIN (NITROSTAT) 0.4 MG SL tablet Place 1 tablet (0.4 mg total) under the tongue every 5 (five) minutes as needed for chest pain. 07/26/23   Leone Brand, NP  REPATHA SURECLICK 140 MG/ML SOAJ ADMINISTER 1 ML UNDER THE SKIN EVERY 14 DAYS. 12/07/23   Tonny Bollman, MD  rOPINIRole (REQUIP) 1 MG tablet Take 1 tablet (1 mg total) by mouth daily. 10/19/23   Rocky Morel, DO  Semaglutide, 1 MG/DOSE, 4 MG/3ML SOPN Inject 1 mg as directed once a week. 01/26/24   Shamleffer, Konrad Dolores, MD  triamcinolone cream (KENALOG) 0.1 % Apply 1 Application topically 2 (two) times daily. Patient taking differently: Apply 1 Application topically 2 (two) times daily as needed (rash). 10/22/23   Rocky Morel, DO  vortioxetine HBr (TRINTELLIX) 20 MG TABS tablet Take 1 tablet (20 mg total) by mouth daily. 03/02/24   Rocky Morel, DO      Allergies    Pravastatin,  Trazodone and nefazodone, Ezetimibe, Hydrochlorothiazide, Latex, and Metformin and related    Review of Systems   Review of Systems  Physical Exam Updated Vital Signs BP (!) 159/101   Pulse 71   Temp (!) 97.5 F (36.4 C)   Resp 16   SpO2 100%  Physical Exam Vitals reviewed.  Constitutional:      Appearance: He is not toxic-appearing.  Cardiovascular:     Rate and Rhythm: Normal rate.  Pulmonary:     Effort: Pulmonary effort is normal.  Abdominal:     Palpations: Abdomen is soft.  Musculoskeletal:        General: Normal range of motion.     Cervical back: Normal range of motion.  Skin:    General: Skin is warm.  Neurological:     General: No focal deficit present.     Mental Status: He is alert.     ED Results / Procedures / Treatments   Labs (all labs ordered are listed, but only abnormal results are displayed) Labs Reviewed  CBC - Abnormal; Notable for the following components:      Result Value   WBC 14.4 (*)    All other components within normal limits  RESP PANEL BY RT-PCR (RSV, FLU A&B, COVID)  [id number]  BASIC METABOLIC PANEL WITH GFR    EKG None  Radiology CT Chest W Contrast Result Date: 04/05/2024 CLINICAL DATA:  Provided history: Mediastinal mass Cough and shortness of breath. EXAM: CT CHEST WITH CONTRAST TECHNIQUE: Multidetector CT imaging of the chest was performed during intravenous contrast administration. RADIATION DOSE REDUCTION: This exam was performed according to the departmental dose-optimization program which includes automated exposure control, adjustment of the mA and/or kV according to patient size and/or use of iterative reconstruction technique. CONTRAST:  50mL OMNIPAQUE IOHEXOL 350 MG/ML SOLN COMPARISON:  Chest radiograph earlier today.  Cardiac CT 01/10/2024 FINDINGS: Cardiovascular: The heart is normal in size. There are coronary artery calcifications/stents. Aortic atherosclerosis without aneurysm. Normal caliber of the central pulmonary  arteries. No pericardial effusion. Mediastinum/Nodes: Shotty mediastinal lymph nodes. This includes a 12 mm right anterior paratracheal node series 3, image 58. 14 mm AP window node series 3, image 53, previously 12 mm. Additional subcentimeter mediastinal lymph nodes. Mild mid and distal esophageal wall thickening. No mediastinal mass. No thyroid nodule. Lungs/Pleura: Diffuse subpleural ground-glass opacity and nodularity, new from January cardiac CT. Nodular consolidation within the anterior right lower lobe, series 4 image 100, proximally 14 mm. Additional scattered smaller nodular densities in both lungs, also new. Trace bilateral pleural effusions. Upper Abdomen: 12 mm periportal node series 3, image 159. Right renal cyst. No further follow-up imaging is recommended. Musculoskeletal: Mild T11 superior endplate compression deformity has a chronic appearance. There are no acute or suspicious osseous abnormalities. IMPRESSION: 1. Diffuse subpleural ground-glass opacity and nodularity, new from January. This is suspicious  for atypical infection. 2. Nodular consolidation within the anterior right lower lobe, proximally 14 mm. Additional scattered smaller nodular densities in both lungs, also new. This is also likely infectious. 3. Trace bilateral pleural effusions. 4. Shotty mediastinal lymph nodes, likely reactive. 5. Mild mid and distal esophageal wall thickening, can be seen with esophagitis or reflux. 6. Coronary artery calcifications/stents. Aortic Atherosclerosis (ICD10-I70.0). Electronically Signed   By: Narda Rutherford M.D.   On: 04/05/2024 22:44   DG Chest 2 View Result Date: 04/05/2024 CLINICAL DATA:  Cough, shortness of breath. EXAM: CHEST - 2 VIEW COMPARISON:  April 02, 2024. FINDINGS: The heart size and mediastinal contours are within normal limits. Stable minimal bibasilar subsegmental atelectasis or scarring is noted. The visualized skeletal structures are unremarkable. IMPRESSION: Stable minimal  bibasilar subsegmental atelectasis or scarring. Electronically Signed   By: Lupita Raider M.D.   On: 04/05/2024 15:27    Procedures Procedures    Medications Ordered in ED Medications  levofloxacin (LEVAQUIN) tablet 750 mg (has no administration in time range)  iohexol (OMNIPAQUE) 350 MG/ML injection 50 mL (50 mLs Intravenous Contrast Given 04/05/24 2017)    ED Course/ Medical Decision Making/ A&P                                 Medical Decision Making 63 year old male here today for persistent cough.  Differential diagnoses include lung CA, bronchitis, pleurisy, pneumonia, less likely PE.  Plan-patient had a chest x-ray done at triage which did not show any pneumonia.  Given his persistent symptoms, will obtain CT imaging of the patient's chest.  Patient nontoxic-appearing.  Reassessment 10:50 PM-patient CT has resulted, shows an atypical infection.  Given the medications patient is taking previously, I believe is appropriate treated with Levaquin.  Patient nontoxic-appearing, mild leukocytosis.  Otherwise looks well.  Return precaution discussed with patient.  Will discharge.  Amount and/or Complexity of Data Reviewed Labs: ordered. Radiology: ordered.  Risk Prescription drug management.           Final Clinical Impression(s) / ED Diagnoses Final diagnoses:  Community acquired pneumonia, unspecified laterality    Rx / DC Orders ED Discharge Orders          Ordered    levofloxacin (LEVAQUIN) 750 MG tablet  Daily        04/05/24 2250              Anders Simmonds T, DO 04/05/24 2250

## 2024-04-05 NOTE — Telephone Encounter (Signed)
 Spoke to cardiology office yesterday, they informed me that the patient has not been cleared for surgery as of yet, he has a follow up in May and should be cleared at that point, the patient had to complete a 6 month course of blood thinnner due to a previous procedure.

## 2024-04-05 NOTE — Discharge Instructions (Signed)
 Your CT scan did show a persistent pneumonia.  I am going to treat you with a stronger antibiotic called Levaquin.  You can take this once per day for the next 1 week.  This should take care of your pneumonia.  Follow-up with your primary care doctor within 1 week for reevaluation.

## 2024-04-05 NOTE — ED Notes (Signed)
 PT received a taxi voucher.

## 2024-04-05 NOTE — ED Notes (Signed)
 Patient transported to CT

## 2024-04-05 NOTE — ED Notes (Signed)
 Pt arrived to bed

## 2024-04-06 ENCOUNTER — Ambulatory Visit (HOSPITAL_COMMUNITY)
Admission: RE | Admit: 2024-04-06 | Discharge: 2024-04-06 | Disposition: A | Discharge status: Home / Self Care | Source: Ambulatory Visit | Attending: Otolaryngology | Admitting: Otolaryngology

## 2024-04-06 DIAGNOSIS — R0981 Nasal congestion: Secondary | ICD-10-CM | POA: Insufficient documentation

## 2024-04-11 ENCOUNTER — Emergency Department (HOSPITAL_COMMUNITY)

## 2024-04-11 ENCOUNTER — Other Ambulatory Visit: Payer: Self-pay

## 2024-04-11 ENCOUNTER — Observation Stay (HOSPITAL_COMMUNITY)
Admission: EM | Admit: 2024-04-11 | Discharge: 2024-04-12 | Disposition: A | Discharge status: Home / Self Care | Attending: Emergency Medicine | Admitting: Emergency Medicine

## 2024-04-11 ENCOUNTER — Encounter (HOSPITAL_COMMUNITY): Payer: Self-pay

## 2024-04-11 DIAGNOSIS — F418 Other specified anxiety disorders: Secondary | ICD-10-CM | POA: Diagnosis not present

## 2024-04-11 DIAGNOSIS — I480 Paroxysmal atrial fibrillation: Secondary | ICD-10-CM | POA: Diagnosis not present

## 2024-04-11 DIAGNOSIS — Z7901 Long term (current) use of anticoagulants: Secondary | ICD-10-CM | POA: Diagnosis not present

## 2024-04-11 DIAGNOSIS — E785 Hyperlipidemia, unspecified: Secondary | ICD-10-CM | POA: Insufficient documentation

## 2024-04-11 DIAGNOSIS — E119 Type 2 diabetes mellitus without complications: Secondary | ICD-10-CM

## 2024-04-11 DIAGNOSIS — F419 Anxiety disorder, unspecified: Secondary | ICD-10-CM | POA: Diagnosis present

## 2024-04-11 DIAGNOSIS — E1169 Type 2 diabetes mellitus with other specified complication: Secondary | ICD-10-CM | POA: Diagnosis present

## 2024-04-11 DIAGNOSIS — Z91040 Latex allergy status: Secondary | ICD-10-CM | POA: Insufficient documentation

## 2024-04-11 DIAGNOSIS — Z95820 Peripheral vascular angioplasty status with implants and grafts: Secondary | ICD-10-CM

## 2024-04-11 DIAGNOSIS — Z7902 Long term (current) use of antithrombotics/antiplatelets: Secondary | ICD-10-CM | POA: Insufficient documentation

## 2024-04-11 DIAGNOSIS — Z794 Long term (current) use of insulin: Secondary | ICD-10-CM | POA: Diagnosis not present

## 2024-04-11 DIAGNOSIS — I10 Essential (primary) hypertension: Secondary | ICD-10-CM | POA: Diagnosis not present

## 2024-04-11 DIAGNOSIS — I152 Hypertension secondary to endocrine disorders: Secondary | ICD-10-CM | POA: Diagnosis present

## 2024-04-11 DIAGNOSIS — R0602 Shortness of breath: Secondary | ICD-10-CM | POA: Diagnosis present

## 2024-04-11 DIAGNOSIS — Z7985 Long-term (current) use of injectable non-insulin antidiabetic drugs: Secondary | ICD-10-CM | POA: Diagnosis not present

## 2024-04-11 DIAGNOSIS — Z955 Presence of coronary angioplasty implant and graft: Secondary | ICD-10-CM | POA: Diagnosis not present

## 2024-04-11 DIAGNOSIS — Z79899 Other long term (current) drug therapy: Secondary | ICD-10-CM | POA: Insufficient documentation

## 2024-04-11 DIAGNOSIS — J189 Pneumonia, unspecified organism: Secondary | ICD-10-CM | POA: Diagnosis not present

## 2024-04-11 DIAGNOSIS — G2581 Restless legs syndrome: Secondary | ICD-10-CM | POA: Diagnosis not present

## 2024-04-11 DIAGNOSIS — E1159 Type 2 diabetes mellitus with other circulatory complications: Secondary | ICD-10-CM | POA: Diagnosis present

## 2024-04-11 DIAGNOSIS — G4733 Obstructive sleep apnea (adult) (pediatric): Secondary | ICD-10-CM | POA: Diagnosis present

## 2024-04-11 DIAGNOSIS — I2510 Atherosclerotic heart disease of native coronary artery without angina pectoris: Secondary | ICD-10-CM | POA: Diagnosis present

## 2024-04-11 DIAGNOSIS — Z7984 Long term (current) use of oral hypoglycemic drugs: Secondary | ICD-10-CM | POA: Diagnosis not present

## 2024-04-11 DIAGNOSIS — F32A Depression, unspecified: Secondary | ICD-10-CM | POA: Diagnosis present

## 2024-04-11 LAB — RESPIRATORY PANEL BY PCR

## 2024-04-11 LAB — CBC WITH DIFFERENTIAL/PLATELET
Abs Immature Granulocytes: 0.06 10*3/uL (ref 0.00–0.07)
Basophils Absolute: 0.1 10*3/uL (ref 0.0–0.1)
Basophils Relative: 1 %
Eosinophils Absolute: 0.3 10*3/uL (ref 0.0–0.5)
Eosinophils Relative: 2 %
HCT: 53.6 % — ABNORMAL HIGH (ref 39.0–52.0)
Hemoglobin: 17.3 g/dL — ABNORMAL HIGH (ref 13.0–17.0)
Immature Granulocytes: 0 %
Lymphocytes Relative: 13 %
Lymphs Abs: 1.9 10*3/uL (ref 0.7–4.0)
MCH: 29.8 pg (ref 26.0–34.0)
MCHC: 32.3 g/dL (ref 30.0–36.0)
MCV: 92.4 fL (ref 80.0–100.0)
Monocytes Absolute: 0.9 10*3/uL (ref 0.1–1.0)
Monocytes Relative: 6 %
Neutro Abs: 11.5 10*3/uL — ABNORMAL HIGH (ref 1.7–7.7)
Neutrophils Relative %: 78 %
Platelets: 248 10*3/uL (ref 150–400)
RBC: 5.8 MIL/uL (ref 4.22–5.81)
RDW: 13.5 % (ref 11.5–15.5)
WBC: 14.8 10*3/uL — ABNORMAL HIGH (ref 4.0–10.5)
nRBC: 0 % (ref 0.0–0.2)

## 2024-04-11 LAB — COMPREHENSIVE METABOLIC PANEL WITH GFR
ALT: 27 U/L (ref 0–44)
AST: 25 U/L (ref 15–41)
Albumin: 3.5 g/dL (ref 3.5–5.0)
Alkaline Phosphatase: 62 U/L (ref 38–126)
Anion gap: 10 (ref 5–15)
BUN: 19 mg/dL (ref 6–20)
CO2: 25 mmol/L (ref 22–32)
Calcium: 9.9 mg/dL (ref 8.9–10.3)
Chloride: 106 mmol/L (ref 98–111)
Creatinine, Ser: 1.24 mg/dL (ref 0.61–1.24)
GFR, Estimated: >60 mL/min (ref >60)
Glucose, Bld: 82 mg/dL (ref 70–99)
Potassium: 3.6 mmol/L (ref 3.5–5.1)
Sodium: 141 mmol/L (ref 135–145)
Total Bilirubin: 0.5 mg/dL (ref 0.0–1.2)
Total Protein: 7.4 g/dL (ref 6.5–8.1)

## 2024-04-11 LAB — BRAIN NATRIURETIC PEPTIDE: B Natriuretic Peptide: 99.4 pg/mL (ref 0.0–100.0)

## 2024-04-11 LAB — TROPONIN I (HIGH SENSITIVITY): Troponin I (High Sensitivity): 11 ng/L (ref <18)

## 2024-04-11 LAB — SEDIMENTATION RATE: Sed Rate: 8 mm/h (ref 0–16)

## 2024-04-11 MED ORDER — SODIUM CHLORIDE 0.9 % IV SOLN
500.0000 mg | INTRAVENOUS | Status: DC
  Administered 2024-04-12: 500 mg via INTRAVENOUS
  Filled 2024-04-11: qty 5

## 2024-04-11 MED ORDER — SODIUM CHLORIDE 0.9 % IV SOLN
500.0000 mg | Freq: Once | INTRAVENOUS | Status: AC
  Administered 2024-04-11: 500 mg via INTRAVENOUS
  Filled 2024-04-11: qty 5

## 2024-04-11 MED ORDER — CLOPIDOGREL BISULFATE 75 MG PO TABS
75.0000 mg | ORAL_TABLET | Freq: Every day | ORAL | Status: DC
  Administered 2024-04-12: 75 mg via ORAL
  Filled 2024-04-11: qty 1

## 2024-04-11 MED ORDER — VORTIOXETINE HBR 20 MG PO TABS
20.0000 mg | ORAL_TABLET | Freq: Every day | ORAL | Status: DC
  Administered 2024-04-12: 20 mg via ORAL
  Filled 2024-04-11: qty 1

## 2024-04-11 MED ORDER — EMPAGLIFLOZIN 25 MG PO TABS
25.0000 mg | ORAL_TABLET | Freq: Every day | ORAL | Status: DC
  Administered 2024-04-12: 25 mg via ORAL
  Filled 2024-04-11: qty 1

## 2024-04-11 MED ORDER — ALBUTEROL SULFATE (2.5 MG/3ML) 0.083% IN NEBU
2.5000 mg | INHALATION_SOLUTION | Freq: Four times a day (QID) | RESPIRATORY_TRACT | Status: DC | PRN
  Administered 2024-04-12: 2.5 mg via RESPIRATORY_TRACT
  Filled 2024-04-11: qty 3

## 2024-04-11 MED ORDER — SODIUM CHLORIDE 0.9 % IV SOLN
2.0000 g | INTRAVENOUS | Status: DC
  Administered 2024-04-12: 2 g via INTRAVENOUS
  Filled 2024-04-11: qty 20

## 2024-04-11 MED ORDER — ONDANSETRON HCL 4 MG/2ML IJ SOLN
4.0000 mg | Freq: Four times a day (QID) | INTRAMUSCULAR | Status: DC | PRN
Start: 2024-04-11 — End: 2024-04-12

## 2024-04-11 MED ORDER — SODIUM CHLORIDE 0.9 % IV SOLN
1.0000 g | Freq: Once | INTRAVENOUS | Status: AC
  Administered 2024-04-11: 1 g via INTRAVENOUS
  Filled 2024-04-11: qty 10

## 2024-04-11 MED ORDER — SODIUM CHLORIDE 0.9% FLUSH
3.0000 mL | Freq: Two times a day (BID) | INTRAVENOUS | Status: DC
  Administered 2024-04-12: 3 mL via INTRAVENOUS

## 2024-04-11 MED ORDER — METHYLPREDNISOLONE SODIUM SUCC 40 MG IJ SOLR
40.0000 mg | Freq: Once | INTRAMUSCULAR | Status: AC
  Administered 2024-04-11: 40 mg via INTRAVENOUS
  Filled 2024-04-11: qty 1

## 2024-04-11 MED ORDER — APIXABAN 5 MG PO TABS
5.0000 mg | ORAL_TABLET | Freq: Two times a day (BID) | ORAL | Status: DC
  Administered 2024-04-11 – 2024-04-12 (×2): 5 mg via ORAL
  Filled 2024-04-11 (×2): qty 1

## 2024-04-11 MED ORDER — INSULIN GLARGINE-YFGN 100 UNIT/ML ~~LOC~~ SOLN
30.0000 [IU] | Freq: Every day | SUBCUTANEOUS | Status: DC
  Administered 2024-04-11: 30 [IU] via SUBCUTANEOUS
  Filled 2024-04-11 (×2): qty 0.3

## 2024-04-11 MED ORDER — GUAIFENESIN ER 600 MG PO TB12
600.0000 mg | ORAL_TABLET | Freq: Two times a day (BID) | ORAL | Status: DC
  Administered 2024-04-11 – 2024-04-12 (×2): 600 mg via ORAL
  Filled 2024-04-11 (×2): qty 1

## 2024-04-11 MED ORDER — ROPINIROLE HCL 1 MG PO TABS
1.0000 mg | ORAL_TABLET | Freq: Every day | ORAL | Status: DC
  Administered 2024-04-11: 1 mg via ORAL
  Filled 2024-04-11 (×2): qty 1

## 2024-04-11 MED ORDER — ATORVASTATIN CALCIUM 40 MG PO TABS
40.0000 mg | ORAL_TABLET | Freq: Every day | ORAL | Status: DC
  Administered 2024-04-12: 40 mg via ORAL
  Filled 2024-04-11: qty 1

## 2024-04-11 MED ORDER — SENNOSIDES-DOCUSATE SODIUM 8.6-50 MG PO TABS: 1.0000 | ORAL_TABLET | Freq: Every evening | ORAL | Status: DC | PRN

## 2024-04-11 MED ORDER — CARVEDILOL 25 MG PO TABS
25.0000 mg | ORAL_TABLET | Freq: Two times a day (BID) | ORAL | Status: DC
  Administered 2024-04-11 – 2024-04-12 (×2): 25 mg via ORAL
  Filled 2024-04-11: qty 1
  Filled 2024-04-11: qty 2

## 2024-04-11 MED ORDER — ONDANSETRON HCL 4 MG PO TABS: 4.0000 mg | ORAL_TABLET | Freq: Four times a day (QID) | ORAL | Status: DC | PRN

## 2024-04-11 MED ORDER — INSULIN ASPART 100 UNIT/ML IJ SOLN: 0.0000 [IU] | Freq: Three times a day (TID) | INTRAMUSCULAR | Status: DC

## 2024-04-11 MED ORDER — ACETAMINOPHEN 650 MG RE SUPP: 650.0000 mg | Freq: Four times a day (QID) | RECTAL | Status: DC | PRN

## 2024-04-11 MED ORDER — ACETAMINOPHEN 325 MG PO TABS: 650.0000 mg | ORAL_TABLET | Freq: Four times a day (QID) | ORAL | Status: DC | PRN

## 2024-04-11 NOTE — ED Triage Notes (Signed)
 Dx 2 wks ago with pneumonia.  Having continued SOB Chest pain.

## 2024-04-11 NOTE — ED Provider Notes (Signed)
 [name] [name] Provider Note   CSN: [id number] Arrival date & time: 04/11/24  1015     History  Chief Complaint  Patient presents with   Shortness of Breath    [name] [name] is a 63 y.o. male.  Patient presents to the emergency room complaining of continued shortness of breath with intermittent chest tightness related to pneumonia.  He began treatment approximately 7 weeks ago and has now completed 4 rounds of antibiotics with no improvement in symptoms.  Patient initially took doxycycline followed by possibly amoxicillin, with subsequent prescriptions for 500 mg Levaquin and 750 mg Levaquin.  He continues to have the same symptoms as before.  He feels shortness of breath, cough, intermittent chest discomfort.  Patient denies nausea, vomiting, fever, abdominal pain.  He states he is compliant with all of his prescribed medications.  Past medical history significant for resistant hypertension, type II DM, stents in 2024, IBS   Shortness of Breath      Home Medications Prior to Admission medications   Medication Sig Start Date End Date Taking? Authorizing Provider  acetaminophen (TYLENOL) 500 MG tablet Take 2 tablets (1,000 mg total) by mouth every 6 (six) hours as needed for moderate pain (pain score 4-6). Patient taking differently: Take 1,000 mg by mouth as needed for moderate pain (pain score 4-6). 12/24/23  Yes Cleven Dallas, DO  apixaban (ELIQUIS) 5 MG TABS tablet Take 1 tablet (5 mg total) by mouth 2 (two) times daily. 10/07/23 10/06/24 Yes Driscilla George, MD  atorvastatin (LIPITOR) 40 MG tablet Take 1 tablet (40 mg total) by mouth daily. 03/14/24 06/12/24 Yes Arnoldo Lapping, MD  carvedilol (COREG) 25 MG tablet Take 1 tablet (25 mg total) by mouth 2 (two) times daily. 02/29/24 05/29/24 Yes Nathanel Bal, PA-C  cetirizine (ZYRTEC) 10 MG tablet Take 1 tablet (10 mg total) by mouth daily. 03/30/24  Yes Soldatova, Liuba, MD  clopidogrel  (PLAVIX) 75 MG tablet Take 1 tablet (75 mg total) by mouth daily. 10/07/23 10/06/24 Yes Driscilla George, MD  empagliflozin (JARDIANCE) 25 MG TABS tablet Take 1 tablet (25 mg total) by mouth daily before breakfast. 03/30/24  Yes Shamleffer, Ibtehal Jaralla, MD  fluticasone (FLONASE) 50 MCG/ACT nasal spray Place 2 sprays into both nostrils daily. Patient taking differently: Place 2 sprays into both nostrils daily as needed for allergies. 03/30/24  Yes Soldatova, Liuba, MD  insulin glargine (LANTUS SOLOSTAR) 100 UNIT/ML Solostar Pen Inject 60 Units into the skin daily. Patient taking differently: Inject 60 Units into the skin every evening. 01/26/24  Yes Shamleffer, Ibtehal Jaralla, MD  insulin lispro (HUMALOG KWIKPEN) 200 UNIT/ML KwikPen Max daily 100 units Patient taking differently: Inject 40 Units into the skin 2 (two) times daily with a meal. May inject a third 40 unit dose as needed for high blood sugar 01/26/24  Yes Shamleffer, Ibtehal Jaralla, MD  levofloxacin (LEVAQUIN) 750 MG tablet Take 1 tablet (750 mg total) by mouth daily. 04/05/24  Yes Afton Horse T, DO  REPATHA SURECLICK 140 MG/ML SOAJ ADMINISTER 1 ML UNDER THE SKIN EVERY 14 DAYS. 12/07/23  Yes Cooper, Michael, MD  rOPINIRole (REQUIP) 1 MG tablet Take 1 tablet (1 mg total) by mouth daily. 10/19/23  Yes Cleven Dallas, DO  Semaglutide, 1 MG/DOSE, 4 MG/3ML SOPN Inject 1 mg as directed once a week. 01/26/24  Yes Shamleffer, Ibtehal Jaralla, MD  triamcinolone cream (KENALOG) 0.1 % Apply 1 Application topically 2 (two) times daily. Patient taking differently: Apply 1 Application  topically 2 (two) times daily as needed (rash). 10/22/23  Yes Cleven Dallas, DO  vortioxetine HBr (TRINTELLIX) 20 MG TABS tablet Take 1 tablet (20 mg total) by mouth daily. 03/02/24  Yes Cleven Dallas, DO  Continuous Glucose Sensor (DEXCOM G7 SENSOR) MISC Change sensor every 10 days 02/07/24   Shamleffer, Ibtehal Jaralla, MD  Insulin Pen Needle (UNIFINE PENTIPS) 32G X 4 MM  MISC Use with Levemir 01/19/24   Cleven Dallas, DO  Insulin Pen Needle 32G X 4 MM MISC 1 Device by Does not apply route in the morning, at noon, in the evening, and at bedtime. 01/26/24   Shamleffer, Ibtehal Jaralla, MD  nitroGLYCERIN (NITROSTAT) 0.4 MG SL tablet Place 1 tablet (0.4 mg total) under the tongue every 5 (five) minutes as needed for chest pain. 07/26/23   Onetha Bile, NP      Allergies    Pravastatin, Trazodone and nefazodone, Ezetimibe, Hydrochlorothiazide, Latex, and Metformin and related    Review of Systems   Review of Systems  Respiratory:  Positive for shortness of breath.     Physical Exam Updated Vital Signs BP (!) 143/86 (BP Location: Left Arm)   Pulse 79   Temp (!) 97.5 F (36.4 C) (Oral)   Resp 18   SpO2 92%  Physical Exam Vitals and nursing note reviewed.  Constitutional:      General: He is not in acute distress.    Appearance: He is well-developed.  HENT:     Head: Normocephalic and atraumatic.  Eyes:     Conjunctiva/sclera: Conjunctivae normal.  Cardiovascular:     Rate and Rhythm: Normal rate and regular rhythm.  Pulmonary:     Effort: Pulmonary effort is normal. No respiratory distress.     Breath sounds: Rhonchi present.  Abdominal:     Palpations: Abdomen is soft.     Tenderness: There is no abdominal tenderness.  Musculoskeletal:        General: No swelling.     Cervical back: Neck supple.  Skin:    General: Skin is warm and dry.     Capillary Refill: Capillary refill takes less than 2 seconds.  Neurological:     Mental Status: He is alert.  Psychiatric:        Mood and Affect: Mood normal.     ED Results / Procedures / Treatments   Labs (all labs ordered are listed, but only abnormal results are displayed) Labs Reviewed  CBC WITH DIFFERENTIAL/PLATELET - Abnormal; Notable for the following components:      Result Value   WBC 14.8 (*)    Hemoglobin 17.3 (*)    HCT 53.6 (*)    Neutro Abs 11.5 (*)    All other components  within normal limits  COMPREHENSIVE METABOLIC PANEL WITH GFR  BRAIN NATRIURETIC PEPTIDE  SEDIMENTATION RATE  TROPONIN I (HIGH SENSITIVITY)    EKG EKG Interpretation Date/Time:  Tuesday April 11 2024 10:29:23 EDT Ventricular Rate:  95 PR Interval:  164 QRS Duration:  80 QT Interval:  350 QTC Calculation: 439 R Axis:   103  Text Interpretation: Normal sinus rhythm Anterolateral infarct , age undetermined T wave abnormality, consider inferior ischemia Confirmed by Jerilynn Montenegro [phone]) on 04/11/2024 3:15:28 PM  Radiology DG Chest 2 View Result Date: 04/11/2024 CLINICAL DATA:  Shortness of breath. EXAM: CHEST - 2 VIEW COMPARISON:  04/05/2024. FINDINGS: The heart size and mediastinal contours are within normal limits. Streaky opacity at the right lung base, likely corresponds to previously described nodular  consolidation within the anterior right lower lobe on the prior CT chest dated 04/05/2024. No new focal consolidation, pleural effusion, or pneumothorax. No acute osseous abnormality. IMPRESSION: Streaky opacity at the right lung base, likely corresponds to previously described nodular consolidation within the anterior right lower lobe on the prior CT chest dated 04/05/2024. No new focal consolidation. Electronically Signed   By: Hart Robinsons M.D.   On: 04/11/2024 11:56    Procedures Procedures    Medications Ordered in ED Medications  cefTRIAXone (ROCEPHIN) 1 g in sodium chloride 0.9 % 100 mL IVPB (0 g Intravenous Stopped 04/11/24 1713)  azithromycin (ZITHROMAX) 500 mg in sodium chloride 0.9 % 250 mL IVPB (0 mg Intravenous Stopped 04/11/24 1835)    ED Course/ Medical Decision Making/ A&P                                 Medical Decision Making Risk Decision regarding hospitalization.   This patient presents to the ED for concern of shortness of breath, this involves an extensive number of treatment options, and is a complaint that carries with it a high risk of  complications and morbidity.  The differential diagnosis includes pneumonia, ACS, asthma/COPD, fluid overload, others   Co morbidities that complicate the patient evaluation  Pneumonia, hypertension, type II DM   Additional history obtained:   External records from outside source obtained and reviewed including cardiology notes   Lab Tests:  I Ordered, and personally interpreted labs.  The pertinent results include: Leukocytosis with a white count of 14,800   Imaging Studies ordered:  I ordered imaging studies including chest x-ray I independently visualized and interpreted imaging which showed  Streaky opacity at the right lung base, likely corresponds to  previously described nodular consolidation within the anterior right  lower lobe on the prior CT chest dated 04/05/2024. No new focal  consolidation.   I agree with the radiologist interpretation   Cardiac Monitoring: / EKG:  The patient was maintained on a cardiac monitor.  I personally viewed and interpreted the cardiac monitored which showed an underlying rhythm of: Sinus rhythm   Problem List / ED Course / Critical interventions / Medication management   I ordered medication including rocephin and azithromycin  for pneumonia  Reevaluation of the patient after these medicines showed that the patient stayed the same I have reviewed the patients home medicines and have made adjustments as needed   Consultations Obtained:  I requested consultation with the medicine team, Dr.Patel with [facility], and discussed lab and imaging findings as well as pertinent plan - they recommend: admission   Social Determinants of Health:  Patient is a former smoker   Test / Admission - Considered:  Patient with continued symptoms of pneumonia despite 7 weeks of outpatient treatment including doxycycline, amoxicillin, and Levaquin.  Feel the patient would benefit from admission for IV antibiotics at this point due to failure of  outpatient therapy.  Patient ambulated with SpO2 and had a brief drop into the upper 80s.  He rebounded to approximately 92% on room air.         Final Clinical Impression(s) / ED Diagnoses Final diagnoses:  Community acquired pneumonia of right lower lobe of lung    Rx / DC Orders ED Discharge Orders     None         Pamala Duffel 04/11/24 Mitzi Davenport, MD 04/11/24 2317

## 2024-04-11 NOTE — Hospital Course (Signed)
 [name] [name] is a 63 y.o. male with medical history significant for CAD s/p DES to LCx (06/2023) and LAD (10/2023), PAF on Eliquis s/p ablation (02/01/2024), T2DM, HTN, HLD, depression/anxiety, OSA intolerant to CPAP who is admitted with persistent dyspnea and due to presumed atypical pneumonia not responding to outpatient antibiotics.

## 2024-04-11 NOTE — ED Provider Triage Note (Signed)
 Emergency Medicine Provider Triage Evaluation Note  [name] [name] , a 63 y.o. male  was evaluated in triage.  Pt complains of persistent cough after having 3 rounds of antibiotics.  Patient reports he has had symptoms of pneumonia for almost 7 weeks and he is tired of it.  Review of Systems  Positive: cough Negative: vomiting  Physical Exam  BP (!) 145/91 (BP Location: Right Arm)   Pulse 93   Temp (!) 97.5 F (36.4 C)   Resp 18   SpO2 95%  Gen:   Awake, no distress , well nourish well developed. Resp:  Normal effort grossly clear MSK:   Moves extremities without difficulty no peripheral edema Other: No focal neuro deficits   Medical Decision Making  Medically screening exam initiated at 10:44 AM.  Appropriate orders placed.  [name] [name] was informed that the remainder of the evaluation will be completed by another provider, this initial triage assessment does not replace that evaluation, and the importance of remaining in the ED until their evaluation is complete.     Wynetta Heckle, MD 04/11/24 1046

## 2024-04-11 NOTE — H&P (Signed)
 History and Physical    [name] [name] ZOX:[id number] DOB: 02/18/1961 DOA: 04/11/2024  PCP: Farris Has, MD  Patient coming from: Home  I have personally briefly reviewed patient's old medical records in [facility]  Chief Complaint: Shortness of breath  HPI: [name] [name] is a 63 y.o. male with medical history significant for CAD s/p DES to LCx (06/2023) and LAD (10/2023), PAF on Eliquis s/p ablation (02/01/2024), T2DM, HTN, HLD, depression/anxiety, OSA intolerant to CPAP who presented to the ED for evaluation of shortness of breath.  Patient reports starting treatment for pneumonia about 6 weeks ago and has completed several rounds of antibiotics.  Initially took doxycycline followed by amoxicillin then given a prescription for Levaquin 500 mg daily on 4/6 and then Levaquin 750 mg daily when seen in the ED on 4/9.  He underwent CT chest with contrast on 4/9 which showed diffuse subpleural groundglass opacity and nodularity suspicious for atypical infection, findings were new compared to January.  Nodular consolidation within the anterior right lower lobe with additional scattered smaller nodular densities in both lungs also new.  Trace bilateral pleural effusions.  He tested negative for COVID, influenza, RSV on 4/9.  Patient states that he had some mild improvement with most recent course of Levaquin but still gets fatigued easily with mild exertion or simple tasks such as washing the dishes.  He has had cough occasionally productive of greenish tinged sputum.  He denies fevers, chills, diaphoresis, nausea, vomiting, abdominal pain, dysuria.  He has some occasional lower sternal vague chest discomfort at times.  He says this feels different than when he had his heart attack.  Next  Patient states he is a former smoker, he quit smoking in 1993.  He denies any recent travel.  He denies any obvious environmental exposures.  ED Course  Labs/Imaging on admission: I have personally reviewed  following labs and imaging studies.  Initial vitals showed BP 143/86, pulse 79, RR 18, temp 97.5 F, SpO2 92% on room air.  Labs showed WBC 14.8, hemoglobin 17.3, platelets 248,000, sodium 141, potassium 3.6, bicarb 25, BUN 19, creatinine 1.24, serum glucose 82, LFTs within normal limits, ESR 8, BNP 99.4, troponin 11.  2 view chest x-ray showed streaky opacity at the right lung base likely corresponding to previously described nodular consolidation within the anterior right lower lobe on prior CT chest dated 04/05/2024.  No new focal consolidation.  Patient was given IV ceftriaxone and azithromycin.  The hospitalist service was consulted to admit.  Review of Systems: All systems reviewed and are negative except as documented in history of present illness above.   Past Medical History:  Diagnosis Date   Anxiety    Coronary artery disease    Depression    Diabetes mellitus    Type 2   Enlarged prostate    Environmental allergies    Environmental and seasonal allergies    GERD (gastroesophageal reflux disease)    Headache(784.0)    sinus and migraines   Hyperlipidemia    Hypertension    IBS (irritable bowel syndrome)    Low testosterone    S/P angioplasty with stent to distal LCX and mid LCX both DES 07/25/23 07/26/2023   Sleep apnea    has used BiPAP in past; last sleep study >10 years    Past Surgical History:  Procedure Laterality Date   ATRIAL FIBRILLATION ABLATION N/A 02/01/2024   Procedure: ATRIAL FIBRILLATION ABLATION;  Surgeon: Nobie Putnam, MD;  Location: [facility] INVASIVE CV LAB;  Service:  Cardiovascular;  Laterality: N/A;   COLONOSCOPY  09/2018   normal, repeat in 10 years per patient.  [place], [place]   CORONARY STENT INTERVENTION N/A 11/10/2023   Procedure: CORONARY STENT INTERVENTION;  Surgeon: Arnoldo Lapping, MD;  Location: [facility] INVASIVE CV LAB;  Service: Cardiovascular;  Laterality: N/A;   CORONARY ULTRASOUND/IVUS N/A 11/10/2023   Procedure: Coronary Ultrasound/IVUS;   Surgeon: Arnoldo Lapping, MD;  Location[facility] INVASIVE CV LAB;  Service: Cardiovascular;  Laterality: N/A;   CORONARY/GRAFT ACUTE MI REVASCULARIZATION N/A 07/25/2023   Procedure: Coronary/Graft Acute MI Revascularization;  Surgeon: Arnoldo Lapping, MD;  Location: [facility] INVASIVE CV LAB;  Service: Cardiovascular;  Laterality: N/A;   KNEE ARTHROSCOPY Left    LEFT HEART CATH AND CORONARY ANGIOGRAPHY N/A 07/25/2023   Procedure: LEFT HEART CATH AND CORONARY ANGIOGRAPHY;  Surgeon: Arnoldo Lapping, MD;  Location: [facility] INVASIVE CV LAB;  Service: Cardiovascular;  Laterality: N/A;   RENAL ANGIOGRAPHY N/A 07/16/2020   Procedure: RENAL ANGIOGRAPHY;  Surgeon: Knox Perl, MD;  Location: [facility] INVASIVE CV LAB;  Service: Cardiovascular;  Laterality: N/A;   SACROILIAC JOINT FUSION Right 05/02/2014   Procedure: SACROILIAC JOINT FUSION;  Surgeon: Estevan Helper, MD;  Location: [facility] OR;  Service: Orthopedics;  Laterality: Right;  Right sided sacroiliac joint fusion   TONSILLECTOMY     uvula removed    Social History: Former smoker, quit in 1993.  Allergies  Allergen Reactions   Pravastatin Other (See Comments)    Joint myalgias   Trazodone And Nefazodone     Had restless legs   Ezetimibe Other (See Comments)    Myalgias, joint pain   Hydrochlorothiazide Other (See Comments)    lightheadedness   Latex Rash   Metformin And Related Diarrhea    Family History  Problem Relation Age of Onset   Cancer Mother        lung   Cancer Father        prostate, mets to bone, lung   Cancer Brother        melanoma   Heart disease Maternal Grandmother        MI   Lung disease Maternal Grandfather        black lung   Heart disease Paternal Grandmother    Stroke Paternal Grandmother    Heart disease Paternal Grandfather    Heart disease Brother 44       MI   Diabetes Brother      Prior to Admission medications   Medication Sig Start Date End Date Taking? Authorizing Provider  acetaminophen (TYLENOL) 500 MG tablet  Take 2 tablets (1,000 mg total) by mouth every 6 (six) hours as needed for moderate pain (pain score 4-6). Patient taking differently: Take 1,000 mg by mouth as needed for moderate pain (pain score 4-6). 12/24/23  Yes Cleven Dallas, DO  apixaban (ELIQUIS) 5 MG TABS tablet Take 1 tablet (5 mg total) by mouth 2 (two) times daily. 10/07/23 10/06/24 Yes Driscilla George, MD  atorvastatin (LIPITOR) 40 MG tablet Take 1 tablet (40 mg total) by mouth daily. 03/14/24 06/12/24 Yes Arnoldo Lapping, MD  carvedilol (COREG) 25 MG tablet Take 1 tablet (25 mg total) by mouth 2 (two) times daily. 02/29/24 05/29/24 Yes Nathanel Bal, PA-C  cetirizine (ZYRTEC) 10 MG tablet Take 1 tablet (10 mg total) by mouth daily. 03/30/24  Yes Soldatova, Liuba, MD  clopidogrel (PLAVIX) 75 MG tablet Take 1 tablet (75 mg total) by mouth daily. 10/07/23 10/06/24 Yes Driscilla George, MD  empagliflozin (JARDIANCE) 25 MG  TABS tablet Take 1 tablet (25 mg total) by mouth daily before breakfast. 03/30/24  Yes Shamleffer, Konrad Dolores, MD  fluticasone (FLONASE) 50 MCG/ACT nasal spray Place 2 sprays into both nostrils daily. Patient taking differently: Place 2 sprays into both nostrils daily as needed for allergies. 03/30/24  Yes Ashok Croon, MD  insulin glargine (LANTUS SOLOSTAR) 100 UNIT/ML Solostar Pen Inject 60 Units into the skin daily. Patient taking differently: Inject 60 Units into the skin every evening. 01/26/24  Yes Shamleffer, Konrad Dolores, MD  insulin lispro (HUMALOG KWIKPEN) 200 UNIT/ML KwikPen Max daily 100 units Patient taking differently: Inject 40 Units into the skin 2 (two) times daily with a meal. May inject a third 40 unit dose as needed for high blood sugar 01/26/24  Yes Shamleffer, Konrad Dolores, MD  levofloxacin (LEVAQUIN) 750 MG tablet Take 1 tablet (750 mg total) by mouth daily. 04/05/24  Yes Anders Simmonds T, DO  REPATHA SURECLICK 140 MG/ML SOAJ ADMINISTER 1 ML UNDER THE SKIN EVERY 14 DAYS. 12/07/23  Yes Tonny Bollman, MD  rOPINIRole (REQUIP) 1 MG tablet Take 1 tablet (1 mg total) by mouth daily. 10/19/23  Yes Rocky Morel, DO  Semaglutide, 1 MG/DOSE, 4 MG/3ML SOPN Inject 1 mg as directed once a week. 01/26/24  Yes Shamleffer, Konrad Dolores, MD  triamcinolone cream (KENALOG) 0.1 % Apply 1 Application topically 2 (two) times daily. Patient taking differently: Apply 1 Application topically 2 (two) times daily as needed (rash). 10/22/23  Yes Rocky Morel, DO  vortioxetine HBr (TRINTELLIX) 20 MG TABS tablet Take 1 tablet (20 mg total) by mouth daily. 03/02/24  Yes Rocky Morel, DO  Continuous Glucose Sensor (DEXCOM G7 SENSOR) MISC Change sensor every 10 days 02/07/24   Shamleffer, Konrad Dolores, MD  Insulin Pen Needle (UNIFINE PENTIPS) 32G X 4 MM MISC Use with Levemir 01/19/24   Rocky Morel, DO  Insulin Pen Needle 32G X 4 MM MISC 1 Device by Does not apply route in the morning, at noon, in the evening, and at bedtime. 01/26/24   Shamleffer, Konrad Dolores, MD  nitroGLYCERIN (NITROSTAT) 0.4 MG SL tablet Place 1 tablet (0.4 mg total) under the tongue every 5 (five) minutes as needed for chest pain. 07/26/23   Leone Brand, NP    Physical Exam: Vitals:   04/11/24 1444 04/11/24 1917 04/11/24 1918 04/11/24 2000  BP: (!) 143/86   (!) 146/88  Pulse: 79   78  Resp: 18   16  Temp: (!) 97.5 F (36.4 C) 98.7 F (37.1 C)  (!) 97.5 F (36.4 C)  TempSrc: Oral Oral  Oral  SpO2: 92%   92%  Weight:   85.3 kg   Height:   5\' 10"  (1.778 m)    Constitutional: Resting in bed, NAD, calm, comfortable Eyes: EOMI, lids and conjunctivae normal ENMT: Mucous membranes are moist. Posterior pharynx clear of any exudate or lesions.Normal dentition.  Neck: normal, supple, no masses. Respiratory: Bibasilar inspiratory crackles. Normal respiratory effort. No accessory muscle use.  Cardiovascular: Regular rate and rhythm, no murmurs / rubs / gallops. No extremity edema. 2+ pedal pulses. Abdomen: no tenderness, no  masses palpated. Musculoskeletal: no clubbing / cyanosis. No joint deformity upper and lower extremities. Good ROM, no contractures. Normal muscle tone.  Skin: no rashes, lesions, ulcers. No induration Neurologic: Sensation intact. Strength 5/5 in all 4.  Psychiatric: Normal judgment and insight. Alert and oriented x 3. Normal mood.   EKG: Personally reviewed. Sinus rhythm, rate 95, no significant ischemic changes.  Assessment/Plan Principal Problem:   Atypical pneumonia Active Problems:   Paroxysmal atrial fibrillation (HCC)   CAD (coronary artery disease)   Hyperlipidemia associated with type 2 diabetes mellitus (HCC)   Hypertension associated with diabetes (HCC)   Type 2 diabetes mellitus (HCC)   Obstructive sleep apnea   Anxiety and depression   S/P angioplasty with stent to distal LCX and mid LCX both DES 07/25/23   [name] [name] is a 63 y.o. male with medical history significant for CAD s/p DES to LCx (06/2023) and LAD (10/2023), PAF on Eliquis s/p ablation (02/01/2024), T2DM, HTN, HLD, depression/anxiety, OSA intolerant to CPAP who is admitted with persistent dyspnea and due to presumed atypical pneumonia not responding to outpatient antibiotics.  Assessment and Plan: Atypical pneumonia: Presenting with ongoing dyspnea with minimal exertion despite several outpatient courses of antibiotics for treatment of atypical pneumonia.  CT chest 4/9 with diffuse subpleural groundglass opacity and nodularity as well as nodular consolidation within right lower lobe, felt due to atypical infection.  Inflammatory process also considered although ESR is only 8. - Continue IV ceftriaxone and azithromycin - Obtain full respiratory viral panel - Check strep pneumonia and Legionella urinary antigens - Trial dose of IV Solu-Medrol 40 mg once tonight - Supplemental oxygen as needed  Paroxysmal atrial fibrillation: Stable with controlled rate.  Continue Coreg and Eliquis.  Coronary artery  disease: S/p DES to LCx 06/2023 and LAD 10/2023.  Currently stable.  Continue Eliquis, Plavix, atorvastatin.  Type 2 diabetes: Continue Jardiance and placed on reduced dose Semglee 30 units plus SSI.  Hypertension: Continue Coreg.  Hyperlipidemia: Continue atorvastatin.  Depression/anxiety: Continue Trintellix.  Restless leg syndrome: Continue ropinirole.  OSA: Not using CPAP due to intolerance.   DVT prophylaxis:  apixaban (ELIQUIS) tablet 5 mg   Code Status: Full code, confirmed with patient on admission Family Communication: Discussed with patient Disposition Plan: From home and likely discharge to home pending clinical progress Consults called: None Severity of Illness: The appropriate patient status for this patient is OBSERVATION. Observation status is judged to be reasonable and necessary in order to provide the required intensity of service to ensure the patient's safety. The patient's presenting symptoms, physical exam findings, and initial radiographic and laboratory data in the context of their medical condition is felt to place them at decreased risk for further clinical deterioration. Furthermore, it is anticipated that the patient will be medically stable for discharge from the hospital within 2 midnights of admission.   Edith Gores MD [facility]  If 7PM-7AM, please contact night-coverage [email or web address]  04/11/2024, 9:41 PM

## 2024-04-12 ENCOUNTER — Other Ambulatory Visit (HOSPITAL_COMMUNITY): Payer: Self-pay

## 2024-04-12 ENCOUNTER — Telehealth (HOSPITAL_BASED_OUTPATIENT_CLINIC_OR_DEPARTMENT_OTHER): Payer: Self-pay | Admitting: Pulmonary Disease

## 2024-04-12 DIAGNOSIS — R0600 Dyspnea, unspecified: Secondary | ICD-10-CM

## 2024-04-12 DIAGNOSIS — Z87891 Personal history of nicotine dependence: Secondary | ICD-10-CM

## 2024-04-12 DIAGNOSIS — R053 Chronic cough: Secondary | ICD-10-CM

## 2024-04-12 DIAGNOSIS — J189 Pneumonia, unspecified organism: Secondary | ICD-10-CM | POA: Diagnosis not present

## 2024-04-12 LAB — BASIC METABOLIC PANEL WITH GFR
Anion gap: 6 (ref 5–15)
BUN: 21 mg/dL — ABNORMAL HIGH (ref 6–20)
CO2: 23 mmol/L (ref 22–32)
Calcium: 9 mg/dL (ref 8.9–10.3)
Chloride: 109 mmol/L (ref 98–111)
Creatinine, Ser: 0.95 mg/dL (ref 0.61–1.24)
GFR, Estimated: >60 mL/min (ref >60)
Glucose, Bld: 166 mg/dL — ABNORMAL HIGH (ref 70–99)
Potassium: 4.2 mmol/L (ref 3.5–5.1)
Sodium: 138 mmol/L (ref 135–145)

## 2024-04-12 LAB — CBC
HCT: 49.5 % (ref 39.0–52.0)
Hemoglobin: 16.2 g/dL (ref 13.0–17.0)
MCH: 29.2 pg (ref 26.0–34.0)
MCHC: 32.7 g/dL (ref 30.0–36.0)
MCV: 89.4 fL (ref 80.0–100.0)
Platelets: 229 10*3/uL (ref 150–400)
RBC: 5.54 MIL/uL (ref 4.22–5.81)
RDW: 13.3 % (ref 11.5–15.5)
WBC: 8.8 10*3/uL (ref 4.0–10.5)
nRBC: 0 % (ref 0.0–0.2)

## 2024-04-12 LAB — GLUCOSE, CAPILLARY
Glucose-Capillary: 209 mg/dL — ABNORMAL HIGH (ref 70–99)
Glucose-Capillary: 164 mg/dL — ABNORMAL HIGH (ref 70–99)

## 2024-04-12 MED ORDER — PREDNISONE 20 MG PO TABS: 40.0000 mg | ORAL_TABLET | Freq: Every day | ORAL | Status: DC

## 2024-04-12 MED ORDER — INSULIN ASPART 100 UNIT/ML IJ SOLN
0.0000 [IU] | Freq: Three times a day (TID) | INTRAMUSCULAR | Status: DC
  Administered 2024-04-12: 3 [IU] via SUBCUTANEOUS

## 2024-04-12 MED ORDER — INSULIN ASPART 100 UNIT/ML IJ SOLN: 0.0000 [IU] | Freq: Every day | INTRAMUSCULAR | Status: DC

## 2024-04-12 MED ORDER — BUDESONIDE-FORMOTEROL FUMARATE 160-4.5 MCG/ACT IN AERO
2.0000 | INHALATION_SPRAY | Freq: Two times a day (BID) | RESPIRATORY_TRACT | 2 refills | Status: DC
  Filled 2024-04-12: qty 10.2, 30d supply, fill #0

## 2024-04-12 MED ORDER — INSULIN ASPART 100 UNIT/ML IJ SOLN: 3.0000 [IU] | Freq: Three times a day (TID) | INTRAMUSCULAR | Status: DC

## 2024-04-12 MED ORDER — PREDNISONE 20 MG PO TABS: 20.0000 mg | ORAL_TABLET | Freq: Every day | ORAL | Status: DC

## 2024-04-12 MED ORDER — FLUTICASONE FUROATE-VILANTEROL 200-25 MCG/ACT IN AEPB
1.0000 | INHALATION_SPRAY | Freq: Every day | RESPIRATORY_TRACT | Status: DC
  Filled 2024-04-12: qty 28

## 2024-04-12 MED ORDER — INSULIN ASPART 100 UNIT/ML IJ SOLN
3.0000 [IU] | Freq: Three times a day (TID) | INTRAMUSCULAR | Status: DC
Start: 2024-04-12 — End: 2024-04-12
  Administered 2024-04-12: 3 [IU] via SUBCUTANEOUS

## 2024-04-12 MED ORDER — PREDNISONE 10 MG PO TABS
ORAL_TABLET | ORAL | 0 refills | Status: AC
  Filled 2024-04-12: qty 30, 12d supply, fill #0

## 2024-04-12 MED ORDER — ROPINIROLE HCL 1 MG PO TABS: 1.0000 mg | ORAL_TABLET | Freq: Every day | ORAL | Status: DC

## 2024-04-12 MED ORDER — PREDNISONE 10 MG PO TABS: 10.0000 mg | ORAL_TABLET | Freq: Every day | ORAL | Status: DC

## 2024-04-12 MED ORDER — INSULIN ASPART 100 UNIT/ML IJ SOLN: 0.0000 [IU] | Freq: Three times a day (TID) | INTRAMUSCULAR | Status: DC

## 2024-04-12 MED ORDER — PREDNISONE 20 MG PO TABS: 30.0000 mg | ORAL_TABLET | Freq: Every day | ORAL | Status: DC

## 2024-04-12 MED ORDER — GUAIFENESIN ER 600 MG PO TB12
600.0000 mg | ORAL_TABLET | Freq: Two times a day (BID) | ORAL | 0 refills | Status: AC
  Filled 2024-04-12: qty 30, 15d supply, fill #0

## 2024-04-12 MED ORDER — PANTOPRAZOLE SODIUM 40 MG PO TBEC
DELAYED_RELEASE_TABLET | ORAL | 0 refills | Status: DC
  Filled 2024-04-12: qty 42, 28d supply, fill #0

## 2024-04-12 MED ORDER — INSULIN GLARGINE-YFGN 100 UNIT/ML ~~LOC~~ SOLN
40.0000 [IU] | Freq: Every day | SUBCUTANEOUS | Status: DC
  Filled 2024-04-12: qty 0.4

## 2024-04-12 NOTE — Discharge Summary (Signed)
 Physician Discharge Summary  Serigne Kubicek FAO:[id number] DOB: 1961/06/04 DOA: 04/11/2024  PCP: Farris Has, MD  Admit date: 04/11/2024 Discharge date: 04/13/2024  Admitted From: Home  Discharge disposition: Home   Recommendations for Outpatient Follow-Up:   Follow up with your primary care provider in one week.  Check CBC, BMP, magnesium in the next visit Follow-up with pulmonary as scheduled by the clinic.   Discharge Diagnosis:   Principal Problem:   Atypical pneumonia Active Problems:   Paroxysmal atrial fibrillation (HCC)   CAD (coronary artery disease)   Hyperlipidemia associated with type 2 diabetes mellitus (HCC)   Hypertension associated with diabetes (HCC)   Type 2 diabetes mellitus (HCC)   Obstructive sleep apnea   Anxiety and depression   S/P angioplasty with stent to distal LCX and mid LCX both DES 07/25/23   Discharge Condition: Improved.  Diet recommendation: Low sodium, heart healthy.  Carbohydrate-modified.    Wound care: None.  Code status: Full.   History of Present Illness:   [name] [name] is a 63 y.o. male with medical history significant for CAD s/p DES to LCx (06/2023) and LAD (10/2023), PAF on Eliquis s/p ablation (02/01/2024), T2DM, HTN, HLD, depression/anxiety, OSA intolerant to CPAP presented to hospital with persistent dyspnea and shortness of breath despite being on antibiotics as outpatient.  He had a CT scan done for 9 which showed diffuse subpleural groundglass opacities and nodularity.  He had initial mild improvement but after completing course of Levaquin he continued to have symptoms including productive cough.  In the ED, vitals were stable.  Labs showed WBC at 14.8.  Creatinine was 1.2.  Chest x-ray showed streaky opacity of the right lung corresponding to previously described nodular consolidation.  Patient was given IV Rocephin and Zithromax and was admitted to hospital for nonimprovement of pneumonia despite oral antibiotics as  outpatient.    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  Failed multiple courses of antibiotics as outpatient.  CT chest 4/9 with diffuse subpleural groundglass opacity and nodularity as well as nodular consolidation within right lower lobe, felt due to atypical infection.  Inflammatory process also considered although ESR is only 8.  Pulmonary was consulted.  Patient was initiated on Rocephin and Zithromax will be discontinued on discharge.Marland Kitchen  Respiratory viral panel negative.  BNP of 99.  Pending strep pneumonia and Legionella urinary antigens.  Received 1 dose of IV Solu-Medrol.  Will be continued on prednisone taper on discharge as per pulmonary.  Vidal Schwalbe to follow the patient as outpatient.  Continue Flonase for possible postnasal drip.   Paroxysmal atrial fibrillation: Controlled at this time.  Continue Coreg and Eliquis   Coronary artery disease: S/p DES to LCx 06/2023 and LAD 10/2023.  Currently stable.  Continue Eliquis, Plavix, atorvastatin.   Type 2 diabetes: Continue Jardiance, increase the dose of Semglee today and increase sliding scale insulin for now.   Hypertension: Continue Coreg.   Hyperlipidemia: Continue atorvastatin.   Depression/anxiety: Continue Trintellix.   Restless leg syndrome: Continue ropinirole.   OSA: Not using CPAP due to intolerance.   Disposition.  At this time, patient is stable for disposition home with outpatient PCP and pulmonary follow-up  Medical Consultants:   Pulmonary  Procedures:    None Subjective:   Today, patient was seen and examined at bedside.  Complains of cough and shortness of breath on exertion.  Discharge Exam:   Vitals:  04/12/24 0833 04/12/24 0855  BP: (!) 140/74   Pulse: 95 96  Resp: 18 19  Temp: 98.2 F (36.8 C)   SpO2: 96% 95%   Vitals:   04/12/24 0058 04/12/24 0635  04/12/24 0833 04/12/24 0855  BP: (!) 155/92 (!) 142/108 (!) 140/74   Pulse: 80 100 95 96  Resp: 17 18 18 19   Temp: 98.3 F (36.8 C) 97.9 F (36.6 C) 98.2 F (36.8 C)   TempSrc: Oral Oral    SpO2: 94% 93% 96% 95%  Weight:      Height:       Body mass index is 26.98 kg/m.   General: Alert awake, not in obvious distress HENT: pupils equally reacting to light,  No scleral pallor or icterus noted. Oral mucosa is moist.  Chest:   Diminished breath sounds bilaterally.  CVS: S1 &S2 heard. No murmur.  Regular rate and rhythm. Abdomen: Soft, nontender, nondistended.  Bowel sounds are heard.   Extremities: No cyanosis, clubbing or edema.  Peripheral pulses are palpable. Psych: Alert, awake and oriented, normal mood CNS:  No cranial nerve deficits.  Power equal in all extremities.   Skin: Warm and dry.  No rashes noted.  The results of significant diagnostics from this hospitalization (including imaging, microbiology, ancillary and laboratory) are listed below for reference.     Diagnostic Studies:   DG Chest 2 View Result Date: 04/11/2024 CLINICAL DATA:  Shortness of breath. EXAM: CHEST - 2 VIEW COMPARISON:  04/05/2024. FINDINGS: The heart size and mediastinal contours are within normal limits. Streaky opacity at the right lung base, likely corresponds to previously described nodular consolidation within the anterior right lower lobe on the prior CT chest dated 04/05/2024. No new focal consolidation, pleural effusion, or pneumothorax. No acute osseous abnormality. IMPRESSION: Streaky opacity at the right lung base, likely corresponds to previously described nodular consolidation within the anterior right lower lobe on the prior CT chest dated 04/05/2024. No new focal consolidation. Electronically Signed   By: Hart Robinsons M.D.   On: 04/11/2024 11:56     Labs:   Basic Metabolic Panel: Recent Labs  Lab 04/11/24 1048 04/12/24 0505  NA 141 138  K 3.6 4.2  CL 106 109  CO2 25 23   GLUCOSE 82 166*  BUN 19 21*  CREATININE 1.24 0.95  CALCIUM 9.9 9.0   GFR Estimated Creatinine Clearance: 85.4 mL/min (by C-G formula based on SCr of 0.95 mg/dL). Liver Function Tests: Recent Labs  Lab 04/11/24 1048  AST 25  ALT 27  ALKPHOS 62  BILITOT 0.5  PROT 7.4  ALBUMIN 3.5   No results for input(s): "LIPASE", "AMYLASE" in the last 168 hours. No results for input(s): "AMMONIA" in the last 168 hours. Coagulation profile No results for input(s): "INR", "PROTIME" in the last 168 hours.  CBC: Recent Labs  Lab 04/11/24 1048 04/12/24 0505  WBC 14.8* 8.8  NEUTROABS 11.5*  --   HGB 17.3* 16.2  HCT 53.6* 49.5  MCV 92.4 89.4  PLT 248 229   Cardiac Enzymes: No results for input(s): "CKTOTAL", "CKMB", "CKMBINDEX", "TROPONINI" in the last 168 hours. BNP: Invalid input(s): "POCBNP" CBG: Recent Labs  Lab 04/12/24 0835 04/12/24 1117  GLUCAP 209* 164*   D-Dimer No results for input(s): "DDIMER" in the last 72 hours. Hgb A1c No results for input(s): "HGBA1C" in the last 72 hours. Lipid Profile No results for input(s): "CHOL", "HDL", "LDLCALC", "TRIG", "CHOLHDL", "LDLDIRECT" in the last 72 hours. Thyroid function studies No results  for input(s): "TSH", "T4TOTAL", "T3FREE", "THYROIDAB" in the last 72 hours.  Invalid input(s): "FREET3" Anemia work up No results for input(s): "VITAMINB12", "FOLATE", "FERRITIN", "TIBC", "IRON", "RETICCTPCT" in the last 72 hours. Microbiology Recent Results (from the past 240 hours)  Resp panel by RT-PCR (RSV, Flu A&B, Covid) Anterior Nasal Swab     Status: None   Collection Time: 04/05/24 12:19 PM   Specimen: Anterior Nasal Swab  Result Value Ref Range Status   SARS Coronavirus 2 by RT PCR NEGATIVE NEGATIVE Final   Influenza A by PCR NEGATIVE NEGATIVE Final   Influenza B by PCR NEGATIVE NEGATIVE Final    Comment: (NOTE) The Xpert Xpress SARS-CoV-2/FLU/RSV plus assay is intended as an aid in the diagnosis of influenza from  Nasopharyngeal swab specimens and should not be used as a sole basis for treatment. Nasal washings and aspirates are unacceptable for Xpert Xpress SARS-CoV-2/FLU/RSV testing.  Fact Sheet for Patients: BloggerCourse.com  Fact Sheet for Healthcare Providers: SeriousBroker.it  This test is not yet approved or cleared by the [place] FDA and has been authorized for detection and/or diagnosis of SARS-CoV-2 by FDA under an Emergency Use Authorization (EUA). This EUA will remain in effect (meaning this test can be used) for the duration of the COVID-19 declaration under Section 564(b)(1) of the Act, 21 U.S.C. section 360bbb-3(b)(1), unless the authorization is terminated or revoked.     Resp Syncytial Virus by PCR NEGATIVE NEGATIVE Final    Comment: (NOTE) Fact Sheet for Patients: BloggerCourse.com  Fact Sheet for Healthcare Providers: SeriousBroker.it  This test is not yet approved or cleared by the [place] FDA and has been authorized for detection and/or diagnosis of SARS-CoV-2 by FDA under an Emergency Use Authorization (EUA). This EUA will remain in effect (meaning this test can be used) for the duration of the COVID-19 declaration under Section 564(b)(1) of the Act, 21 U.S.C. section 360bbb-3(b)(1), unless the authorization is terminated or revoked.  Performed at [facility], [address], [place], [place] [address]   Respiratory (~20 pathogens) panel by PCR     Status: None   Collection Time: 04/11/24  9:02 PM   Specimen: Nasopharyngeal Swab; Respiratory  Result Value Ref Range Status   Adenovirus NOT DETECTED NOT DETECTED Final   Coronavirus 229E NOT DETECTED NOT DETECTED Final    Comment: (NOTE) The Coronavirus on the Respiratory Panel, DOES NOT test for the novel  Coronavirus (2019 nCoV)    Coronavirus HKU1 NOT DETECTED NOT DETECTED Final    Coronavirus NL63 NOT DETECTED NOT DETECTED Final   Coronavirus OC43 NOT DETECTED NOT DETECTED Final   Metapneumovirus NOT DETECTED NOT DETECTED Final   Rhinovirus / Enterovirus NOT DETECTED NOT DETECTED Final   Influenza A NOT DETECTED NOT DETECTED Final   Influenza B NOT DETECTED NOT DETECTED Final   Parainfluenza Virus 1 NOT DETECTED NOT DETECTED Final   Parainfluenza Virus 2 NOT DETECTED NOT DETECTED Final   Parainfluenza Virus 3 NOT DETECTED NOT DETECTED Final   Parainfluenza Virus 4 NOT DETECTED NOT DETECTED Final   Respiratory Syncytial Virus NOT DETECTED NOT DETECTED Final   Bordetella pertussis NOT DETECTED NOT DETECTED Final   Bordetella Parapertussis NOT DETECTED NOT DETECTED Final   Chlamydophila pneumoniae NOT DETECTED NOT DETECTED Final   Mycoplasma pneumoniae NOT DETECTED NOT DETECTED Final    Comment: Performed at [facility], [address] [place], [place] [address]     Discharge Instructions:   Discharge Instructions  Diet - low sodium heart healthy   Complete by: As directed    Discharge instructions   Complete by: As directed    Follow-up with your primary care provider in 1 week.  Complete the course of the steroid and continue inhalers.  Follow-up with pulmonary clinic as scheduled by the clinic.  Seek medical attention for worsening symptoms.   Increase activity slowly   Complete by: As directed       Allergies as of 04/12/2024       Reactions   Pravastatin Other (See Comments)   Joint myalgias   Trazodone And Nefazodone    Had restless legs   Ezetimibe Other (See Comments)   Myalgias, joint pain   Hydrochlorothiazide Other (See Comments)   lightheadedness   Latex Rash   Metformin And Related Diarrhea        Medication List     STOP taking these medications    levofloxacin 750 MG tablet Commonly known as: Levaquin       TAKE these medications    Acetaminophen Extra Strength 500 MG Tabs Take 2 tablets (1,000 mg total) by  mouth every 6 (six) hours as needed for moderate pain (pain score 4-6). What changed: when to take this   apixaban 5 MG Tabs tablet Commonly known as: ELIQUIS Take 1 tablet (5 mg total) by mouth 2 (two) times daily.   atorvastatin 40 MG tablet Commonly known as: LIPITOR Take 1 tablet (40 mg total) by mouth daily.   carvedilol 25 MG tablet Commonly known as: COREG Take 1 tablet (25 mg total) by mouth 2 (two) times daily.   cetirizine 10 MG tablet Commonly known as: ZYRTEC Take 1 tablet (10 mg total) by mouth daily.   clopidogrel 75 MG tablet Commonly known as: Plavix Take 1 tablet (75 mg total) by mouth daily.   Dexcom G7 Sensor Misc Change sensor every 10 days   empagliflozin 25 MG Tabs tablet Commonly known as: Jardiance Take 1 tablet (25 mg total) by mouth daily before breakfast.   fluticasone 50 MCG/ACT nasal spray Commonly known as: FLONASE Place 2 sprays into both nostrils daily. What changed:  when to take this reasons to take this   guaiFENesin 600 MG 12 hr tablet Commonly known as: MUCINEX Take 1 tablet (600 mg total) by mouth 2 (two) times daily for 15 days.   HumaLOG KwikPen 200 UNIT/ML KwikPen Generic drug: insulin lispro Max daily 100 units What changed:  how much to take how to take this when to take this additional instructions   Lantus SoloStar 100 UNIT/ML Solostar Pen Generic drug: insulin glargine Inject 60 Units into the skin daily. What changed: when to take this   nitroGLYCERIN 0.4 MG SL tablet Commonly known as: NITROSTAT Place 1 tablet (0.4 mg total) under the tongue every 5 (five) minutes as needed for chest pain.   pantoprazole 40 MG tablet Commonly known as: Protonix Take 1 tablet (40 mg total) by mouth 2 (two) times daily before a meal for 14 days, THEN 1 tablet (40 mg total) daily for 14 days. Start taking on: April 12, 2024   predniSONE 10 MG tablet Commonly known as: DELTASONE Take 4 tablets (40 mg total) by mouth daily  with breakfast for 3 days, THEN 3 tablets (30 mg total) daily with breakfast for 3 days, THEN 2 tablets (20 mg total) daily with breakfast for 3 days, THEN 1 tablet (10 mg total) daily with breakfast for 3 days. Start taking on: April 13, 2024   Repatha SureClick 140 MG/ML Soaj Generic drug: Evolocumab ADMINISTER 1 ML UNDER THE SKIN EVERY 14 DAYS.   rOPINIRole 1 MG tablet Commonly known as: REQUIP Take 1 tablet (1 mg total) by mouth daily.   Semaglutide (1 MG/DOSE) 4 MG/3ML Sopn Inject 1 mg as directed once a week.   Symbicort 160-4.5 MCG/ACT inhaler Generic drug: budesonide-formoterol Inhale 2 puffs into the lungs 2 (two) times daily.   triamcinolone cream 0.1 % Commonly known as: KENALOG Apply 1 Application topically 2 (two) times daily. What changed:  when to take this reasons to take this   Trintellix 20 MG Tabs tablet Generic drug: vortioxetine HBr Take 1 tablet (20 mg total) by mouth daily.   Unifine Pentips 32G X 4 MM Misc Generic drug: Insulin Pen Needle Use with Levemir   Insulin Pen Needle 32G X 4 MM Misc 1 Device by Does not apply route in the morning, at noon, in the evening, and at bedtime.        Follow-up Information     Ronna Coho, MD Follow up in 1 week(s).   Specialty: Family Medicine Contact information: [address] [place] [address] [phone]                  Time coordinating discharge: 39 minutes  Signed:  Jaleah Lefevre  [facility] 04/13/2024, 7:29 AM

## 2024-04-12 NOTE — TOC Transition Note (Signed)
 Transition of Care [facility]) - Discharge Note   Patient Details  Name: [name] [name] MRN: [id number] Date of Birth: 08-16-1961  Transition of [facility]) CM/SW Contact:  Tom-Johnson, Angelique Ken, RN Phone Number: 04/12/2024, 3:01 PM   Clinical Narrative:     Patient is scheduled for discharge today.  Hospital f/u and discharge instructions on AVS. Prescriptions sent to [facility] pharmacy and patient will receive meds prior discharge. Patient requested for a cab, Therapist, nutritional and Release of Liability form explained to patient with understanding verbalized, form signed and placed in patient's chart. Cab voucher given to RN. No further TOC needs noted.       Final next level of care: Home/Self Care Barriers to Discharge: Barriers Resolved   Patient Goals and CMS Choice Patient states their goals for this hospitalization and ongoing recovery are:: To return home CMS Medicare.gov Compare Post Acute Care list provided to:: Patient Choice offered to / list presented to : Patient      Discharge Placement                Patient to be transferred to facility by: [facility]      Discharge Plan and Services Additional resources added to the After Visit Summary for                  DME Arranged: N/A DME Agency: NA       HH Arranged: NA HH Agency: NA        Social Drivers of Health (SDOH) Interventions SDOH Screenings   Food Insecurity: No Food Insecurity (04/12/2024)  Housing: Low Risk  (04/12/2024)  Transportation Needs: No Transportation Needs (04/12/2024)  Utilities: Not At Risk (04/12/2024)  Depression (PHQ2-9): High Risk (10/15/2023)  Physical Activity: Unknown (03/20/2019)  Social Connections: Unknown (05/11/2022)   [facility] from [facility], [facility]  Tobacco Use: Medium Risk (04/11/2024)  Health Literacy: Adequate Health Literacy (04/12/2024)     Readmission Risk Interventions     No data to display

## 2024-04-12 NOTE — Consult Note (Signed)
 NAME:  Tycen Dockter, MRN:  [id number], DOB:  January 16, 1961, LOS: 0 ADMISSION DATE:  04/11/2024, CONSULTATION DATE:  04/12/24 REFERRING MD:  Efrain Grant, CHIEF COMPLAINT:  SOB   History of Present Illness:  63 year old male with OSA with CPAP intolerance, CAD s/p DES Lcx and LAD, pAF on eliquis, HTN, HLD, DM2, prior tobacco use was admitted obv status to [facility] 4/15 after presenting to ED for SOB.  SOB x several weeks -- has had multiple abx courses leading up to this admission-- most recently 500mg  levaquin was Rx 4/6 after seeing his PCP, and changed to 750mg  after by EDF 4/9. He had a CT chest during 4/9 ED visit which had diffuse sub-pleural GGO / nodularity, RLL nodular consolidation & scattered bilateral small densities. Felt this could reflect atypical infection   4/15 hospitalization, is on azithro rocephin. On RA. C/o SOB  PCCM is consulted 4/16 in this setting.   He reports progressive shortness of breath and cough for >2 months. Associated with wheezing. Worse at night. Has not been on any inhalers. Not been trialed on steroids until this admission.   Pertinent  Medical History  OSA CAD  pAF on eliquis HTN  HLD DM2  Significant Hospital Events: Including procedures, antibiotic start and stop dates in addition to other pertinent events   4/15 admit to [facility] 4/16 PCCM consult for SOB   Interim History / Subjective:  As above  Objective   Blood pressure (!) 140/74, pulse 96, temperature 98.2 F (36.8 C), resp. rate 19, height 5\' 10"  (1.778 m), weight 85.3 kg, SpO2 95%.        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  -4/9 Neg for covid, influenza, RSV -EKG 4/16 NSR with HR 90s -Likely multifactorial in setting of recent pneumonia s/p abx x 3 (doxy, amoxi, levaquin) with last course on ED visit 4/9 (second course of levaquin) and deconditioning. Also uncontrolled moderate OSA, atrial fibrillation can contribute to dyspnea. Has history of smoking but quit in 1993. Post-viral cough can last up to 8-12 weeks and if patient has repeat viral insults then he may be experiencing prolonged symptoms -CT chest reviewed 4/9 with subpleural GGO not previously seen on available chest imaging on cardiac scans. Given new findings suspect this is inflammatory/infectious.   --Agree with steroids. Will write prednisone taper --Trial ICS/LABA for symptom control --Favor short course of antibiotics vs discontinuing antibiotics as doubt this is active infectious process since patient has already completed second course of levaquin recently. --Encourage IS, OOB as tolerated and physical therapy if indicated --Will need outpatient PFTs between 2-3 months to determine underlying obstructive or restrictive defect --Will need repeat CT in 6-8 weeks as outpatient --Will arrange outpatient follow-up  Moderate OSA with CPAP intolerance Re-confirmed on 02/2024 HST.  Following with ENT as outpatient for [facility] evaluation  Labs   CBC: Recent Labs  Lab 04/05/24 1226 04/11/24 1048 04/12/24 0505  WBC 14.4* 14.8* 8.8  NEUTROABS  --  11.5*  --   HGB 16.2 17.3* 16.2  HCT 50.9 53.6* 49.5  MCV 92.0 92.4 89.4  PLT 283 248 229    Basic Metabolic Panel: Recent Labs  Lab 04/05/24 1226 04/11/24 1048 04/12/24 0505  NA 140 141 138  K 3.9 3.6 4.2  CL 103 106  109  CO2 27 25 23   GLUCOSE 97 82 166*  BUN 17 19 21*  CREATININE 1.22 1.24 0.95  CALCIUM 9.5 9.9 9.0   GFR: Estimated Creatinine Clearance: 85.4 mL/min (by C-G formula based on SCr of 0.95 mg/dL). Recent Labs  Lab 04/05/24 1226 04/11/24 1048 04/12/24 0505  WBC 14.4* 14.8* 8.8    Liver Function Tests: Recent Labs  Lab 04/11/24 1048  AST 25  ALT 27  ALKPHOS 62  BILITOT 0.5  PROT 7.4  ALBUMIN 3.5   No results for input(s): "LIPASE", "AMYLASE" in the last 168 hours. No results for input(s): "AMMONIA" in the last 168 hours.  ABG    Component Value Date/Time   HCO3 21.3 07/25/2023 1948   TCO2 30 12/19/2013 2117   ACIDBASEDEF 1.5 07/25/2023 1948   O2SAT 88.4 07/25/2023 1948     Coagulation Profile: No results for input(s): "INR", "PROTIME" in the last 168 hours.  Cardiac Enzymes: No results for input(s): "CKTOTAL", "CKMB", "CKMBINDEX", "TROPONINI" in the last 168 hours.  HbA1C: Hemoglobin A1C  Date/Time Value Ref Range Status  01/26/2024 08:09 AM 6.1 (A) 4.0 - 5.6 % Final  09/21/2023 10:13 AM 8.2 (A) 4.0 - 5.6 % Final   HbA1c POC (<> result, manual entry)  Date/Time Value Ref Range Status  03/25/2023 10:54 AM >14.0 (A) 4.0 - 5.6 % Corrected    Comment:    CBG CRITICAL HIGH  Reported to Dr Reita Cliche 03-25-2023 10:42  T Fulcher, PBT   Hgb A1c MFr Bld  Date/Time Value Ref Range Status  11/11/2023 06:06 AM 6.3 (H) 4.8 - 5.6 % Final    Comment:    (NOTE) Pre diabetes:          5.7%-6.4%  Diabetes:              >6.4%  Glycemic control for   <7.0% adults with diabetes   07/25/2023 11:10 AM >15.5 (H) 4.8 - 5.6 % Final    Comment:    (NOTE)         Prediabetes: 5.7 - 6.4         Diabetes: >6.4         Glycemic control for adults with diabetes: <7.0     CBG: Recent Labs  Lab 04/12/24 0835 04/12/24 1117  GLUCAP 209* 164*    Review of Systems:   Review of Systems  Constitutional:  Negative for chills, diaphoresis, fever, malaise/fatigue and weight  loss.  HENT:  Negative for congestion.   Respiratory:  Positive for cough and shortness of breath. Negative for hemoptysis, sputum production and wheezing.   Cardiovascular:  Negative for chest pain, palpitations and leg swelling.     Past Medical History:  He,  has a past medical history of Anxiety, Coronary artery disease, Depression, Diabetes mellitus, Enlarged prostate, Environmental allergies, Environmental and seasonal allergies, GERD (gastroesophageal reflux disease), Headache(784.0), Hyperlipidemia, Hypertension, IBS (irritable bowel syndrome), Low testosterone, S/P angioplasty with stent to distal LCX and mid LCX both DES 07/25/23 (07/26/2023), and Sleep apnea.   Surgical History:   Past Surgical History:  Procedure Laterality Date   ATRIAL FIBRILLATION ABLATION N/A 02/01/2024   Procedure: ATRIAL FIBRILLATION ABLATION;  Surgeon: Ardeen Kohler, MD;  Location: [facility] INVASIVE CV LAB;  Service: Cardiovascular;  Laterality: N/A;   COLONOSCOPY  09/2018   normal, repeat in 10 years per patient.  [place], [place]   CORONARY STENT INTERVENTION N/A 11/10/2023   Procedure: CORONARY STENT INTERVENTION;  Surgeon: Arnoldo Lapping, MD;  Location: [facility] INVASIVE CV LAB;  Service: Cardiovascular;  Laterality: N/A;   CORONARY ULTRASOUND/IVUS N/A 11/10/2023   Procedure: Coronary Ultrasound/IVUS;  Surgeon: Arnoldo Lapping, MD;  Location: [facility] INVASIVE CV LAB;  Service: Cardiovascular;  Laterality: N/A;   CORONARY/GRAFT ACUTE MI REVASCULARIZATION N/A 07/25/2023   Procedure: Coronary/Graft Acute MI Revascularization;  Surgeon: Arnoldo Lapping, MD;  Location: [facility] INVASIVE CV LAB;  Service: Cardiovascular;  Laterality: N/A;   KNEE ARTHROSCOPY Left    LEFT HEART CATH AND CORONARY ANGIOGRAPHY N/A 07/25/2023   Procedure: LEFT HEART CATH AND CORONARY ANGIOGRAPHY;  Surgeon: Arnoldo Lapping, MD;  Location[facility] INVASIVE CV LAB;  Service: Cardiovascular;  Laterality: N/A;   RENAL ANGIOGRAPHY N/A 07/16/2020   Procedure: RENAL  ANGIOGRAPHY;  Surgeon: Knox Perl, MD;  Location: [facility] INVASIVE CV LAB;  Service: Cardiovascular;  Laterality: N/A;   SACROILIAC JOINT FUSION Right 05/02/2014   Procedure: SACROILIAC JOINT FUSION;  Surgeon: Estevan Helper, MD;  Location: [facility] OR;  Service: Orthopedics;  Laterality: Right;  Right sided sacroiliac joint fusion   TONSILLECTOMY     uvula removed     Social History:   reports that he quit smoking about 32 years ago. His smoking use included cigarettes. He started smoking about 42 years ago. He has a 10 pack-year smoking history. He has never used smokeless tobacco. He reports that he does not drink alcohol and does not use drugs.   Family History:  His family history includes Cancer in his brother, father, and mother; Diabetes in his brother; Heart disease in his maternal grandmother, paternal grandfather, and paternal grandmother; Heart disease (age of onset: 20) in his brother; Lung disease in his maternal grandfather; Stroke in his paternal grandmother.   Allergies Allergies  Allergen Reactions   Pravastatin Other (See Comments)    Joint myalgias   Trazodone And Nefazodone     Had restless legs   Ezetimibe Other (See Comments)    Myalgias, joint pain   Hydrochlorothiazide Other (See Comments)    lightheadedness   Latex Rash   Metformin And Related Diarrhea     Home Medications  Prior to Admission medications   Medication Sig Start Date End Date Taking? Authorizing Provider  acetaminophen (TYLENOL) 500 MG tablet Take 2 tablets (1,000 mg total) by mouth every 6 (six) hours as needed for moderate pain (pain score 4-6). Patient taking differently: Take 1,000 mg by mouth as needed for moderate pain (pain score 4-6). 12/24/23  Yes Cleven Dallas, DO  apixaban (ELIQUIS) 5 MG TABS tablet Take 1 tablet (5 mg total) by mouth 2 (two) times daily. 10/07/23 10/06/24 Yes Driscilla George, MD  atorvastatin (LIPITOR) 40 MG tablet Take 1 tablet (40 mg total) by mouth daily. 03/14/24  06/12/24 Yes Arnoldo Lapping, MD  carvedilol (COREG) 25 MG tablet Take 1 tablet (25 mg total) by mouth 2 (two) times daily. 02/29/24 05/29/24 Yes Nathanel Bal, PA-C  cetirizine (ZYRTEC) 10 MG tablet Take 1 tablet (10 mg total) by mouth daily. 03/30/24  Yes Soldatova, Liuba, MD  clopidogrel (PLAVIX) 75 MG tablet Take 1 tablet (75 mg total) by mouth  daily. 10/07/23 10/06/24 Yes Driscilla George, MD  empagliflozin (JARDIANCE) 25 MG TABS tablet Take 1 tablet (25 mg total) by mouth daily before breakfast. 03/30/24  Yes Shamleffer, Ibtehal Jaralla, MD  fluticasone (FLONASE) 50 MCG/ACT nasal spray Place 2 sprays into both nostrils daily. Patient taking differently: Place 2 sprays into both nostrils daily as needed for allergies. 03/30/24  Yes Soldatova, Liuba, MD  insulin glargine (LANTUS SOLOSTAR) 100 UNIT/ML Solostar Pen Inject 60 Units into the skin daily. Patient taking differently: Inject 60 Units into the skin every evening. 01/26/24  Yes Shamleffer, Ibtehal Jaralla, MD  insulin lispro (HUMALOG KWIKPEN) 200 UNIT/ML KwikPen Max daily 100 units Patient taking differently: Inject 40 Units into the skin 2 (two) times daily with a meal. May inject a third 40 unit dose as needed for high blood sugar 01/26/24  Yes Shamleffer, Ibtehal Jaralla, MD  levofloxacin (LEVAQUIN) 750 MG tablet Take 1 tablet (750 mg total) by mouth daily. 04/05/24  Yes Afton Horse T, DO  REPATHA SURECLICK 140 MG/ML SOAJ ADMINISTER 1 ML UNDER THE SKIN EVERY 14 DAYS. 12/07/23  Yes Cooper, Michael, MD  rOPINIRole (REQUIP) 1 MG tablet Take 1 tablet (1 mg total) by mouth daily. 10/19/23  Yes Cleven Dallas, DO  Semaglutide, 1 MG/DOSE, 4 MG/3ML SOPN Inject 1 mg as directed once a week. 01/26/24  Yes Shamleffer, Ibtehal Jaralla, MD  triamcinolone cream (KENALOG) 0.1 % Apply 1 Application topically 2 (two) times daily. Patient taking differently: Apply 1 Application topically 2 (two) times daily as needed (rash). 10/22/23  Yes Cleven Dallas, DO   vortioxetine HBr (TRINTELLIX) 20 MG TABS tablet Take 1 tablet (20 mg total) by mouth daily. 03/02/24  Yes Cleven Dallas, DO  Continuous Glucose Sensor (DEXCOM G7 SENSOR) MISC Change sensor every 10 days 02/07/24   Shamleffer, Ibtehal Jaralla, MD  Insulin Pen Needle (UNIFINE PENTIPS) 32G X 4 MM MISC Use with Levemir 01/19/24   Cleven Dallas, DO  Insulin Pen Needle 32G X 4 MM MISC 1 Device by Does not apply route in the morning, at noon, in the evening, and at bedtime. 01/26/24   Shamleffer, Ibtehal Jaralla, MD  nitroGLYCERIN (NITROSTAT) 0.4 MG SL tablet Place 1 tablet (0.4 mg total) under the tongue every 5 (five) minutes as needed for chest pain. 07/26/23   Onetha Bile, NP     Critical care time: N/A    Care Time 60 min  Genetta Kenning, M.D. [facility] Pulmonary/Critical Care Medicine 04/12/2024 2:00 PM   See Amion for personal pager For hours between 7 PM to 7 AM, please call Elink for urgent questions

## 2024-04-12 NOTE — Telephone Encounter (Addendum)
 Please arrange for outpatient follow-up in 2-4 weeks (with PFTs if possible prior to visit but ok to schedule without) with physician or NP for hospital follow-up at [facility] location or [facility] location

## 2024-04-12 NOTE — Telephone Encounter (Signed)
 Called patient and made him an appointment for 4/30.

## 2024-04-12 NOTE — Progress Notes (Signed)
 PROGRESS NOTE  [name] ZOX:[id number] DOB: [birth year] DOA: 04/11/2024 PCP: Farris Has, MD   LOS: 0 days   Brief narrative:  [name] [name] is a 63 y.o. male with medical history significant for CAD s/p DES to LCx (06/2023) and LAD (10/2023), PAF on Eliquis s/p ablation (02/01/2024), T2DM, HTN, HLD, depression/anxiety, OSA intolerant to CPAP presented to hospital with persistent dyspnea and shortness of breath despite being on antibiotics as outpatient.  He had a CT scan done for 9 which showed diffuse subpleural groundglass opacities and nodularity.  He had initial mild improvement but after completing course of Levaquin he continued to have symptoms including productive cough.  In the ED, vitals were stable.  Labs showed WBC at 14.8.  Creatinine was 1.2.  Chest x-ray showed streaky opacity of the right lung corresponding to previously described nodular consolidation.  Patient was given IV Rocephin and Zithromax and was admitted to hospital for nonimprovement of pneumonia despite oral antibiotics as outpatient.   Assessment/Plan: Principal Problem:   Atypical pneumonia Active Problems:   Paroxysmal atrial fibrillation (HCC)   CAD (coronary artery disease)   Hyperlipidemia associated with type 2 diabetes mellitus (HCC)   Hypertension associated with diabetes (HCC)   Type 2 diabetes mellitus (HCC)   Obstructive sleep apnea   Anxiety and depression   S/P angioplasty with stent to distal LCX and mid LCX both DES 07/25/23   Atypical pneumonia: Failed multiple courses of antibiotics as outpatient.  CT chest 4/9 with diffuse subpleural groundglass opacity and nodularity as well as nodular consolidation within right lower lobe, felt due to atypical infection.  Inflammatory process also considered although ESR is only 8.  Continue Rocephin and Zithromax.  Respiratory viral panel negative.  BNP of 99.  Follow strep pneumonia and Legionella urinary antigens.  Received 1 dose of IV Solu-Medrol.   Patient appears to be frustrated about his symptoms of shortness of breath dyspnea on exertion and cough despite use of antibiotics and request to be seen by pulmonary.  I communicated with pulmonary on-call for consultation.   Paroxysmal atrial fibrillation: Controlled at this time.  Continue Coreg and Eliquis   Coronary artery disease: S/p DES to LCx 06/2023 and LAD 10/2023.  Currently stable.  Continue Eliquis, Plavix, atorvastatin.   Type 2 diabetes: Continue Jardiance, increase the dose of Semglee today and increase sliding scale insulin for now.   Hypertension: Continue Coreg.   Hyperlipidemia: Continue atorvastatin.   Depression/anxiety: Continue Trintellix.   Restless leg syndrome: Continue ropinirole.   OSA: Not using CPAP due to intolerance.  DVT prophylaxis:  apixaban (ELIQUIS) tablet 5 mg   Disposition: Likely home in 1 to 2 days, pulmonary consultation  Status is: Observation The patient will require care spanning > 2 midnights and should be moved to inpatient because: Failed outpatient treatment of pneumonia, IV antibiotics, pulmonary evaluation    Code Status:     Code Status: Full Code  Family Communication: None at bedside  Consultants: Pulmonary  Procedures: None  Anti-infectives:  Rocephin and Zithromax  Anti-infectives (From admission, onward)    Start     Dose/Rate Route Frequency Ordered Stop   04/12/24 1600  cefTRIAXone (ROCEPHIN) 2 g in sodium chloride 0.9 % 100 mL IVPB        2 g 200 mL/hr over 30 Minutes Intravenous Every 24 hours 04/11/24 2021 04/17/24 1559   04/12/24 1600  azithromycin (ZITHROMAX) 500 mg in sodium chloride 0.9 % 250 mL IVPB  500 mg 250 mL/hr over 60 Minutes Intravenous Every 24 hours 04/11/24 2021 04/17/24 1559   04/11/24 1645  cefTRIAXone (ROCEPHIN) 1 g in sodium chloride 0.9 % 100 mL IVPB        1 g 200 mL/hr over 30 Minutes Intravenous  Once 04/11/24 1639 04/11/24 1713   04/11/24 1645  azithromycin  (ZITHROMAX) 500 mg in sodium chloride 0.9 % 250 mL IVPB        500 mg 250 mL/hr over 60 Minutes Intravenous  Once 04/11/24 1639 04/11/24 1835        Subjective: Today, patient was seen and examined at bedside.  Patient feels frustrated that he is in the hospital too long and has not really been getting much attention.  Explained that antibiotics are once daily and that pulmonary will be seeing him  and is upset about all different things.  He continues to complain of productive cough and dyspnea on exertion.  Has history of sinus issues  and runny nose.  Objective: Vitals:   04/12/24 0833 04/12/24 0855  BP: (!) 140/74   Pulse: 95 96  Resp: 18 19  Temp: 98.2 F (36.8 C)   SpO2: 96% 95%    Intake/Output Summary (Last 24 hours) at 04/12/2024 1304 Last data filed at 04/12/2024 1202 Gross per 24 hour  Intake 590 ml  Output 700 ml  Net -110 ml   Filed Weights   04/11/24 1918  Weight: 85.3 kg   Body mass index is 26.98 kg/m.   Physical Exam:  GENERAL: Patient is alert awake and oriented. Not in obvious distress.  On room air. HENT: No scleral pallor or icterus. Pupils equally reactive to light. Oral mucosa is moist NECK: is supple, no gross swelling noted. CHEST: .  Diminished breath sounds bilaterally. CVS: S1 and S2 heard, no murmur. Regular rate and rhythm.  ABDOMEN: Soft, non-tender, bowel sounds are present. EXTREMITIES: No edema. CNS: Cranial nerves are intact. No focal motor deficits. SKIN: warm and dry without rashes.  Data Review: I have personally reviewed the following laboratory data and studies,  CBC: Recent Labs  Lab 04/11/24 1048 04/12/24 0505  WBC 14.8* 8.8  NEUTROABS 11.5*  --   HGB 17.3* 16.2  HCT 53.6* 49.5  MCV 92.4 89.4  PLT 248 229   Basic Metabolic Panel: Recent Labs  Lab 04/11/24 1048 04/12/24 0505  NA 141 138  K 3.6 4.2  CL 106 109  CO2 25 23  GLUCOSE 82 166*  BUN 19 21*  CREATININE 1.24 0.95  CALCIUM 9.9 9.0   Liver Function  Tests: Recent Labs  Lab 04/11/24 1048  AST 25  ALT 27  ALKPHOS 62  BILITOT 0.5  PROT 7.4  ALBUMIN 3.5   No results for input(s): "LIPASE", "AMYLASE" in the last 168 hours. No results for input(s): "AMMONIA" in the last 168 hours. Cardiac Enzymes: No results for input(s): "CKTOTAL", "CKMB", "CKMBINDEX", "TROPONINI" in the last 168 hours. BNP (last 3 results) Recent Labs    04/11/24 1051  BNP 99.4    ProBNP (last 3 results) No results for input(s): "PROBNP" in the last 8760 hours.  CBG: Recent Labs  Lab 04/12/24 0835 04/12/24 1117  GLUCAP 209* 164*   Recent Results (from the past 240 hours)  Resp panel by RT-PCR (RSV, Flu A&B, Covid) Anterior Nasal Swab     Status: None   Collection Time: 04/05/24 12:19 PM   Specimen: Anterior Nasal Swab  Result Value Ref Range Status   SARS  Coronavirus 2 by RT PCR NEGATIVE NEGATIVE Final   Influenza A by PCR NEGATIVE NEGATIVE Final   Influenza B by PCR NEGATIVE NEGATIVE Final    Comment: (NOTE) The Xpert Xpress SARS-CoV-2/FLU/RSV plus assay is intended as an aid in the diagnosis of influenza from Nasopharyngeal swab specimens and should not be used as a sole basis for treatment. Nasal washings and aspirates are unacceptable for Xpert Xpress SARS-CoV-2/FLU/RSV testing.  Fact Sheet for Patients: BloggerCourse.com  Fact Sheet for Healthcare Providers: SeriousBroker.it  This test is not yet approved or cleared by the [place]  FDA and has been authorized for detection and/or diagnosis of SARS-CoV-2 by FDA under an Emergency Use Authorization (EUA). This EUA will remain in effect (meaning this test can be used) for the duration of the COVID-19 declaration under Section 564(b)(1) of the Act, 21 U.S.C. section 360bbb-3(b)(1), unless the authorization is terminated or revoked.     Resp Syncytial Virus by PCR NEGATIVE NEGATIVE Final    Comment: (NOTE) Fact Sheet for  Patients: BloggerCourse.com  Fact Sheet for Healthcare Providers: SeriousBroker.it  This test is not yet approved or cleared by the [place]  FDA and has been authorized for detection and/or diagnosis of SARS-CoV-2 by FDA under an Emergency Use Authorization (EUA). This EUA will remain in effect (meaning this test can be used) for the duration of the COVID-19 declaration under Section 564(b)(1) of the Act, 21 U.S.C. section 360bbb-3(b)(1), unless the authorization is terminated or revoked.  Performed at [facility], [address], [place], [place] [address]   Respiratory (~20 pathogens) panel by PCR     Status: None   Collection Time: 04/11/24  9:02 PM   Specimen: Nasopharyngeal Swab; Respiratory  Result Value Ref Range Status   Adenovirus NOT DETECTED NOT DETECTED Final   Coronavirus 229E NOT DETECTED NOT DETECTED Final    Comment: (NOTE) The Coronavirus on the Respiratory Panel, DOES NOT test for the novel  Coronavirus (2019 nCoV)    Coronavirus HKU1 NOT DETECTED NOT DETECTED Final   Coronavirus NL63 NOT DETECTED NOT DETECTED Final   Coronavirus OC43 NOT DETECTED NOT DETECTED Final   Metapneumovirus NOT DETECTED NOT DETECTED Final   Rhinovirus / Enterovirus NOT DETECTED NOT DETECTED Final   Influenza A NOT DETECTED NOT DETECTED Final   Influenza B NOT DETECTED NOT DETECTED Final   Parainfluenza Virus 1 NOT DETECTED NOT DETECTED Final   Parainfluenza Virus 2 NOT DETECTED NOT DETECTED Final   Parainfluenza Virus 3 NOT DETECTED NOT DETECTED Final   Parainfluenza Virus 4 NOT DETECTED NOT DETECTED Final   Respiratory Syncytial Virus NOT DETECTED NOT DETECTED Final   Bordetella pertussis NOT DETECTED NOT DETECTED Final   Bordetella Parapertussis NOT DETECTED NOT DETECTED Final   Chlamydophila pneumoniae NOT DETECTED NOT DETECTED Final   Mycoplasma pneumoniae NOT DETECTED NOT DETECTED Final    Comment: Performed at  [facility], [address], [place], [place] [address]     Studies: DG Chest 2 View Result Date: 04/11/2024 CLINICAL DATA:  Shortness of breath. EXAM: CHEST - 2 VIEW COMPARISON:  04/05/2024. FINDINGS: The heart size and mediastinal contours are within normal limits. Streaky opacity at the right lung base, likely corresponds to previously described nodular consolidation within the anterior right lower lobe on the prior CT chest dated 04/05/2024. No new focal consolidation, pleural effusion, or pneumothorax. No acute osseous abnormality. IMPRESSION: Streaky opacity at the right lung base, likely corresponds to previously described nodular consolidation within the anterior  right lower lobe on the prior CT chest dated 04/05/2024. No new focal consolidation. Electronically Signed   By: Mannie Seek M.D.   On: 04/11/2024 11:56      Rosena Conradi, MD  [facility] 04/12/2024  If 7PM-7AM, please contact night-coverage

## 2024-04-13 ENCOUNTER — Ambulatory Visit (HOSPITAL_COMMUNITY)
Admission: EM | Admit: 2024-04-13 | Discharge: 2024-04-13 | Disposition: A | Discharge status: Home / Self Care | Payer: Self-pay | Attending: Emergency Medicine | Admitting: Emergency Medicine

## 2024-04-13 ENCOUNTER — Encounter (HOSPITAL_COMMUNITY): Payer: Self-pay

## 2024-04-13 DIAGNOSIS — S0101XA Laceration without foreign body of scalp, initial encounter: Secondary | ICD-10-CM | POA: Diagnosis not present

## 2024-04-13 DIAGNOSIS — S060X0A Concussion without loss of consciousness, initial encounter: Secondary | ICD-10-CM

## 2024-04-13 LAB — MISC LABCORP TEST (SEND OUT): Labcorp test code: 83935

## 2024-04-13 NOTE — ED Provider Notes (Addendum)
 [facility]    CSN: [id number] Arrival date & time: 04/13/24  1101      History   Chief Complaint Chief Complaint  Patient presents with   Laceration    HPI [name] [name] is a 63 y.o. male.   Patient presents with laceration to top of head after accidentally hitting his head on a sunglass holder while getting into a medical transport van this morning around 9:30 AM.  Patient states that shortly after he had some dizziness that has since resolved.  Patient states that he feels like he still has some mild brain fog at this time.  Patient denies loss of consciousness, visual disturbances, weakness, numbness, slurred speech, and confusion.  Bleeding is controlled at this time.  Patient is currently on Plavix for history of A-fib.  The history is provided by the patient and medical records.  Laceration   Past Medical History:  Diagnosis Date   Anxiety    Coronary artery disease    Depression    Diabetes mellitus    Type 2   Enlarged prostate    Environmental allergies    Environmental and seasonal allergies    GERD (gastroesophageal reflux disease)    Headache(784.0)    sinus and migraines   Hyperlipidemia    Hypertension    IBS (irritable bowel syndrome)    Low testosterone    S/P angioplasty with stent to distal LCX and mid LCX both DES 07/25/23 07/26/2023   Sleep apnea    has used BiPAP in past; last sleep study >10 years    Patient Active Problem List   Diagnosis Date Noted   Atypical pneumonia 04/11/2024   Hypercoagulable state due to paroxysmal atrial fibrillation (HCC) 02/29/2024   Uncontrolled morning headache 02/04/2024   S/P UPPP (uvulopalatopharyngoplasty) 02/04/2024   Overbite, excessive 02/04/2024   Excessive daytime sleepiness 02/04/2024   Coronary artery disease of native artery of native heart with stable angina pectoris (HCC) 11/10/2023   Paroxysmal atrial fibrillation (HCC) 10/15/2023   Urge incontinence 10/15/2023   Long-term  (current) use of injectable non-insulin antidiabetic drugs 09/21/2023   Long term (current) use of oral hypoglycemic drugs 09/21/2023   Eczema of lower leg 08/19/2023   S/P angioplasty with stent to distal LCX and mid LCX both DES 07/25/23 07/26/2023   Headache 04/08/2023   Restless legs syndrome 05/14/2020   Snoring 05/14/2020   CAD (coronary artery disease) 05/13/2020   High risk medication use 05/13/2020   Erythrocytosis 05/13/2020   Anxiety and depression 05/13/2020   Hypogonadism in male 01/30/2020   Insomnia 01/30/2020   Prostate enlargement 01/30/2020   Depressed mood 06/06/2019   Nasal valve collapse 06/02/2019   Deviated septum 06/02/2019   Intolerance of continuous positive airway pressure (CPAP) ventilation 05/11/2019   Family history of premature CAD 03/20/2019   Hyperlipidemia associated with type 2 diabetes mellitus (HCC) 03/20/2019   Hypertension associated with diabetes (HCC) 03/20/2019   Type 2 diabetes mellitus (HCC) 03/20/2019   Screen for STD (sexually transmitted disease) 03/20/2019   Low testosterone 03/20/2019   Family history of melanoma 03/20/2019   Vaccine counseling 03/20/2019   Penile lesion 03/20/2019   Ataxia 03/20/2019   Obesity 03/20/2019   Eustachian tube dysfunction, left 02/10/2019   Other allergic rhinitis 01/30/2019   Obstructive sleep apnea 01/30/2019   Impingement syndrome of left shoulder 03/22/2015   Left shoulder pain 02/20/2015   Sacroiliac joint dysfunction 05/02/2014   Nasal congestion 03/21/2014   Thoracic or lumbosacral neuritis or radiculitis,  unspecified 06/24/2012   Degeneration of lumbar or lumbosacral intervertebral disc 06/24/2012    Past Surgical History:  Procedure Laterality Date   ATRIAL FIBRILLATION ABLATION N/A 02/01/2024   Procedure: ATRIAL FIBRILLATION ABLATION;  Surgeon: Ardeen Kohler, MD;  Location: [facility] INVASIVE CV LAB;  Service: Cardiovascular;  Laterality: N/A;   COLONOSCOPY  09/2018   normal, repeat in 10  years per patient.  [place], [place]   CORONARY STENT INTERVENTION N/A 11/10/2023   Procedure: CORONARY STENT INTERVENTION;  Surgeon: Arnoldo Lapping, MD;  Location: [facility] INVASIVE CV LAB;  Service: Cardiovascular;  Laterality: N/A;   CORONARY ULTRASOUND/IVUS N/A 11/10/2023   Procedure: Coronary Ultrasound/IVUS;  Surgeon: Arnoldo Lapping, MD;  Location: [facility] INVASIVE CV LAB;  Service: Cardiovascular;  Laterality: N/A;   CORONARY/GRAFT ACUTE MI REVASCULARIZATION N/A 07/25/2023   Procedure: Coronary/Graft Acute MI Revascularization;  Surgeon: Arnoldo Lapping, MD;  Location: [facility] INVASIVE CV LAB;  Service: Cardiovascular;  Laterality: N/A;   KNEE ARTHROSCOPY Left    LEFT HEART CATH AND CORONARY ANGIOGRAPHY N/A 07/25/2023   Procedure: LEFT HEART CATH AND CORONARY ANGIOGRAPHY;  Surgeon: Arnoldo Lapping, MD;  Location: [facility] INVASIVE CV LAB;  Service: Cardiovascular;  Laterality: N/A;   RENAL ANGIOGRAPHY N/A 07/16/2020   Procedure: RENAL ANGIOGRAPHY;  Surgeon: Knox Perl, MD;  Location: [facility] INVASIVE CV LAB;  Service: Cardiovascular;  Laterality: N/A;   SACROILIAC JOINT FUSION Right 05/02/2014   Procedure: SACROILIAC JOINT FUSION;  Surgeon: Estevan Helper, MD;  Location: [facility] OR;  Service: Orthopedics;  Laterality: Right;  Right sided sacroiliac joint fusion   TONSILLECTOMY     uvula removed       Home Medications    Prior to Admission medications   Medication Sig Start Date End Date Taking? Authorizing Provider  acetaminophen (TYLENOL) 500 MG tablet Take 2 tablets (1,000 mg total) by mouth every 6 (six) hours as needed for moderate pain (pain score 4-6). Patient taking differently: Take 1,000 mg by mouth as needed for moderate pain (pain score 4-6). 12/24/23   Cleven Dallas, DO  apixaban (ELIQUIS) 5 MG TABS tablet Take 1 tablet (5 mg total) by mouth 2 (two) times daily. 10/07/23 10/06/24  Driscilla George, MD  atorvastatin (LIPITOR) 40 MG tablet Take 1 tablet (40 mg total) by mouth daily. 03/14/24 06/12/24   Arnoldo Lapping, MD  carvedilol (COREG) 25 MG tablet Take 1 tablet (25 mg total) by mouth 2 (two) times daily. 02/29/24 05/29/24  Nathanel Bal, PA-C  cetirizine (ZYRTEC) 10 MG tablet Take 1 tablet (10 mg total) by mouth daily. 03/30/24   Soldatova, Liuba, MD  clopidogrel (PLAVIX) 75 MG tablet Take 1 tablet (75 mg total) by mouth daily. 10/07/23 10/06/24  Driscilla George, MD  Continuous Glucose Sensor (DEXCOM G7 SENSOR) MISC Change sensor every 10 days 02/07/24   Shamleffer, Ibtehal Jaralla, MD  empagliflozin (JARDIANCE) 25 MG TABS tablet Take 1 tablet (25 mg total) by mouth daily before breakfast. 03/30/24   Shamleffer, Ibtehal Jaralla, MD  fluticasone (FLONASE) 50 MCG/ACT nasal spray Place 2 sprays into both nostrils daily. Patient taking differently: Place 2 sprays into both nostrils daily as needed for allergies. 03/30/24   Soldatova, Liuba, MD  budesonide-formoterol (SYMBICORT) 160-4.5 MCG/ACT inhaler Inhale 2 puffs into the lungs 2 (two) times daily. 04/12/24   Pokhrel, Laxman, MD  guaiFENesin (MUCINEX) 600 MG 12 hr tablet Take 1 tablet (600 mg total) by mouth 2 (two) times daily for 15 days. 04/12/24 04/27/24  Pokhrel, Laxman, MD  insulin glargine (LANTUS SOLOSTAR) 100 UNIT/ML Solostar  Pen Inject 60 Units into the skin daily. Patient taking differently: Inject 60 Units into the skin every evening. 01/26/24   Shamleffer, Ibtehal Jaralla, MD  insulin lispro (HUMALOG KWIKPEN) 200 UNIT/ML KwikPen Max daily 100 units Patient taking differently: Inject 40 Units into the skin 2 (two) times daily with a meal. May inject a third 40 unit dose as needed for high blood sugar 01/26/24   Shamleffer, Ibtehal Jaralla, MD  Insulin Pen Needle (UNIFINE PENTIPS) 32G X 4 MM MISC Use with Levemir 01/19/24   Cleven Dallas, DO  Insulin Pen Needle 32G X 4 MM MISC 1 Device by Does not apply route in the morning, at noon, in the evening, and at bedtime. 01/26/24   Shamleffer, Ibtehal Jaralla, MD  nitroGLYCERIN (NITROSTAT) 0.4 MG SL  tablet Place 1 tablet (0.4 mg total) under the tongue every 5 (five) minutes as needed for chest pain. 07/26/23   Onetha Bile, NP  pantoprazole (PROTONIX) 40 MG tablet Take 1 tablet (40 mg total) by mouth 2 (two) times daily before a meal for 14 days, THEN 1 tablet (40 mg total) daily for 14 days. 04/12/24 05/10/24  Pokhrel, Laxman, MD  predniSONE (DELTASONE) 10 MG tablet Take 4 tablets (40 mg total) by mouth daily with breakfast for 3 days, THEN 3 tablets (30 mg total) daily with breakfast for 3 days, THEN 2 tablets (20 mg total) daily with breakfast for 3 days, THEN 1 tablet (10 mg total) daily with breakfast for 3 days. 04/13/24 04/25/24  Pokhrel, Laxman, MD  REPATHA SURECLICK 140 MG/ML SOAJ ADMINISTER 1 ML UNDER THE SKIN EVERY 14 DAYS. 12/07/23   Cooper, Michael, MD  rOPINIRole (REQUIP) 1 MG tablet Take 1 tablet (1 mg total) by mouth daily. 10/19/23   Cleven Dallas, DO  Semaglutide, 1 MG/DOSE, 4 MG/3ML SOPN Inject 1 mg as directed once a week. 01/26/24   Shamleffer, Ibtehal Jaralla, MD  triamcinolone cream (KENALOG) 0.1 % Apply 1 Application topically 2 (two) times daily. Patient taking differently: Apply 1 Application topically 2 (two) times daily as needed (rash). 10/22/23   Cleven Dallas, DO  vortioxetine HBr (TRINTELLIX) 20 MG TABS tablet Take 1 tablet (20 mg total) by mouth daily. 03/02/24   Cleven Dallas, DO    Family History Family History  Problem Relation Age of Onset   Cancer Mother        lung   Cancer Father        prostate, mets to bone, lung   Cancer Brother        melanoma   Heart disease Maternal Grandmother        MI   Lung disease Maternal Grandfather        black lung   Heart disease Paternal Grandmother    Stroke Paternal Grandmother    Heart disease Paternal Grandfather    Heart disease Brother 20       MI   Diabetes Brother     Social History Social History   Tobacco Use   Smoking status: Former    Current packs/day: 0.00    Average packs/day: 1  pack/day for 10.0 years (10.0 ttl pk-yrs)    Types: Cigarettes    Start date: 04/20/1982    Quit date: 04/20/1992    Years since quitting: 32.0   Smokeless tobacco: Never  Vaping Use   Vaping status: Never Used  Substance Use Topics   Alcohol use: No   Drug use: No     Allergies   Pravastatin,  Trazodone and nefazodone, Ezetimibe, Hydrochlorothiazide, Latex, and Metformin and related   Review of Systems Review of Systems  Per HPI  Physical Exam Triage Vital Signs ED Triage Vitals  Encounter Vitals Group     BP 04/13/24 1143 139/84     Systolic BP Percentile --      Diastolic BP Percentile --      Pulse Rate 04/13/24 1143 85     Resp 04/13/24 1143 18     Temp 04/13/24 1143 97.9 F (36.6 C)     Temp Source 04/13/24 1143 Oral     SpO2 04/13/24 1143 95 %     Weight --      Height --      Head Circumference --      Peak Flow --      Pain Score 04/13/24 1144 5     Pain Loc --      Pain Education --      Exclude from Growth Chart --    No data found.  Updated Vital Signs BP 139/84 (BP Location: Right Arm)   Pulse 85   Temp 97.9 F (36.6 C) (Oral)   Resp 18   SpO2 95%   Visual Acuity Right Eye Distance:   Left Eye Distance:   Bilateral Distance:    Right Eye Near:   Left Eye Near:    Bilateral Near:     Physical Exam Vitals and nursing note reviewed.  Constitutional:      General: He is awake. He is not in acute distress.    Appearance: Normal appearance. He is well-developed and well-groomed. He is not ill-appearing.  Eyes:     Extraocular Movements: Extraocular movements intact.     Pupils: Pupils are equal, round, and reactive to light.  Musculoskeletal:        General: Normal range of motion.     Cervical back: Normal range of motion and neck supple.  Skin:    General: Skin is warm and dry.     Findings: Laceration present.     Comments: 1 cm well-approximated superficial laceration noted to scalp.  Bleeding controlled and wound appears to be  scabbing over.  Neurological:     General: No focal deficit present.     Mental Status: He is alert and oriented to person, place, and time. Mental status is at baseline.     GCS: GCS eye subscore is 4. GCS verbal subscore is 5. GCS motor subscore is 6.     Cranial Nerves: Cranial nerves 2-12 are intact.     Sensory: Sensation is intact.     Motor: Motor function is intact.     Coordination: Coordination is intact.     Gait: Gait is intact.  Psychiatric:        Behavior: Behavior is cooperative.      UC Treatments / Results  Labs (all labs ordered are listed, but only abnormal results are displayed) Labs Reviewed - No data to display  EKG   Radiology No results found.  Procedures Procedures (including critical care time)  Medications Ordered in UC Medications - No data to display  Initial Impression / Assessment and Plan / UC Course  I have reviewed the triage vital signs and the nursing notes.  Pertinent labs & imaging results that were available during my care of the patient were reviewed by me and considered in my medical decision making (see chart for details).     Patient is well-appearing.  Vitals are  stable.  There is a 1 cm well-approximated superficia laceration noted to scalp.  Bleeding is controlled.  No neurodeficits noted.  GCS 15.  EOMI and PERRLA.  Deferred stapling due to wound being well-approximated without any bleeding at this time.  Provided basic wound care and discussed wound care at home.  Discussed that mild brain fog could be related to a mild concussion, but due to no neurological findings at this time deferred sending patient to ER for CT imaging.  Discussed strict ER precautions. Final Clinical Impressions(s) / UC Diagnoses   Final diagnoses:  Laceration of scalp without foreign body, initial encounter  Concussion without loss of consciousness, initial encounter     Discharge Instructions      Keep the area clean a dry. You can apply  Neosporin or bacitracin to help prevent infection to wound.   It is likely that the brain fog that you are experiencing is related to a mild concussion.  If you develop severe dizziness, visual changes, weakness, numbness, confusion, severe headache, or passing out please seek immediate medical treatment in the emergency department.    ED Prescriptions   None    PDMP not reviewed this encounter.   Karon Packer, NP 04/13/24 1302    Levora Reas A, NP 04/13/24 1309

## 2024-04-13 NOTE — Discharge Instructions (Addendum)
 Keep the area clean a dry. You can apply Neosporin or bacitracin to help prevent infection to wound.   It is likely that the brain fog that you are experiencing is related to a mild concussion.  If you develop severe dizziness, visual changes, weakness, numbness, confusion, severe headache, or passing out please seek immediate medical treatment in the emergency department.

## 2024-04-13 NOTE — ED Triage Notes (Signed)
 Pt states hit his head on a sunglass holder when getting on the medical transport van this morning at 9:30am. Small lac noted, bleeding controlled. States felt dizzy afterward but not now.

## 2024-04-17 ENCOUNTER — Telehealth: Payer: Self-pay | Admitting: Internal Medicine

## 2024-04-17 NOTE — Telephone Encounter (Signed)
 MEDICATION: Receiving Unit for Dexcom  PHARMACY:  [facility]  [address] [place], [place] [address](UEAVW in a new tab or window)  [phone]  HAS THE PATIENT CONTACTED THEIR PHARMACY?  Yes  IS THIS A 90 DAY SUPPLY : Yes  IS PATIENT OUT OF MEDICATION: No  IF NOT; HOW MUCH IS LEFT: Not sure  LAST APPOINTMENT DATE: @1 /29/2025  NEXT APPOINTMENT DATE:@5 /29/2025  DO WE HAVE YOUR PERMISSION TO LEAVE A DETAILED MESSAGE?:Yes  OTHER COMMENTS:    **Let patient know to contact pharmacy at the end of the day to make sure medication is ready. **  ** Please notify patient to allow 48-72 hours to process**  **Encourage patient to contact the pharmacy for refills or they can request refills through [facility]**

## 2024-04-18 NOTE — Telephone Encounter (Signed)
 Patient called back and would like a receiver sample

## 2024-04-18 NOTE — Telephone Encounter (Signed)
 Patient came in to office and picked up one sample of Dexcom G7 Receiver.

## 2024-04-18 NOTE — Telephone Encounter (Signed)
 Receiver has been placed up front

## 2024-04-26 ENCOUNTER — Ambulatory Visit: Admitting: Emergency Medicine

## 2024-04-26 ENCOUNTER — Telehealth: Payer: Self-pay | Admitting: Emergency Medicine

## 2024-04-26 ENCOUNTER — Encounter: Payer: Self-pay | Admitting: Emergency Medicine

## 2024-04-26 VITALS — BP 132/80 | HR 81 | Ht 70.0 in | Wt 202.6 lb

## 2024-04-26 DIAGNOSIS — R9389 Abnormal findings on diagnostic imaging of other specified body structures: Secondary | ICD-10-CM | POA: Diagnosis not present

## 2024-04-26 DIAGNOSIS — J3089 Other allergic rhinitis: Secondary | ICD-10-CM

## 2024-04-26 DIAGNOSIS — R0602 Shortness of breath: Secondary | ICD-10-CM | POA: Diagnosis not present

## 2024-04-26 DIAGNOSIS — K219 Gastro-esophageal reflux disease without esophagitis: Secondary | ICD-10-CM

## 2024-04-26 DIAGNOSIS — J189 Pneumonia, unspecified organism: Secondary | ICD-10-CM

## 2024-04-26 DIAGNOSIS — R0600 Dyspnea, unspecified: Secondary | ICD-10-CM | POA: Insufficient documentation

## 2024-04-26 MED ORDER — PANTOPRAZOLE SODIUM 40 MG PO TBEC: 40.0000 mg | DELAYED_RELEASE_TABLET | Freq: Every day | ORAL | 0 refills | Status: DC

## 2024-04-26 NOTE — Assessment & Plan Note (Addendum)
 Difficult to tease out the overlap between chronic symptoms of upper airway irritation, cough and recent acute illness that required hospitalization.  He he had viral symptoms, possible viral versus bacterial pneumonia with an abnormal CT scan of the chest.  He is now left with persistent upper airway symptoms, cough, noise in his throat, vocal changes.  He was sent home on Symbicort , pantoprazole .  I am going to try stopping the Symbicort  because I think most of his symptoms currently are upper airway in nature.  He needs pulmonary function testing to determine whether there is true asthma present.  In the meantime we will try to treat contributors to upper airway irritation as this seems to be the active process currently.

## 2024-04-26 NOTE — Telephone Encounter (Signed)
 Yes please do

## 2024-04-26 NOTE — Telephone Encounter (Signed)
Refill has been sent to pharm

## 2024-04-26 NOTE — Telephone Encounter (Signed)
 Patient is requesting a 3 month supply for pantoprazole  (PROTONIX ) 40 MG tablet [[id number]]  be called in replacing the 2 month order.

## 2024-04-26 NOTE — Assessment & Plan Note (Signed)
 Add back fluticasone  nasal spray to his antihistamine

## 2024-04-26 NOTE — Patient Instructions (Addendum)
 Please stop Symbicort  for now Please continue Zyrtec  once daily Restart your fluticasone  nasal spray, 2 sprays each nostril once daily.  Do not take this right before bedtime because I do not want the medication draining to your throat We will continue pantoprazole  40 mg once daily for now. We will arrange for full pulmonary function testing We will repeat your CT scan of the chest in June 2025 to compare with your hospitalization study Follow Dr. Baldwin Levee in June after your testing so we can review your symptoms and the results together.

## 2024-04-26 NOTE — Assessment & Plan Note (Signed)
 Does not seem to still be clinically active but question occult GERD contributing to his chronic upper airway irritation and cough, vocal changes.  He is willing to stay on PPI for now we will reorder for him.

## 2024-04-26 NOTE — Assessment & Plan Note (Signed)
 With groundglass traits, pneumonitis during recent hospitalization.  Plan for repeat scan in about 8 weeks to look for resolution.  If there are persistent findings then we will plan workup.

## 2024-04-26 NOTE — Progress Notes (Signed)
 Subjective:    Patient ID: [name] [name], male    DOB: 1961/05/12, 63 y.o.   MRN: [id number]  HPI  63 year old male with history of tobacco use (10 pack years) CAD/PTCI, atrial fibrillation with ablation on Eliquis , diabetes type 2, hypertension, hyperlipidemia, depression anxiety, OSA intolerant of CPAP.  He was just admitted to the hospital with acute dyspnea, asthmatic type symptoms and treated for pneumonia.  His COVID-19, flu were both negative, RVP was negative.  He had subpleural groundglass opacities and nodularity on CT scan of the chest consistent with a pneumonitis/pneumonia.  He was treated with ceftriaxone  and azithromycin , received corticosteroids Started with progressive rhinitis and congestion, progressed over several weeks. A lot of fatigue. He  had cough and wheeze. He hears noise in his throat. He was sent home on Symbicort . He has change in voice quality. He has a lot of nasal gtt and congestion on zyretc. Has tried flonase  without effect. He was discharged on PPI.     Review of Systems As per HPI  Past Medical History:  Diagnosis Date   Anxiety    Coronary artery disease    Depression    Diabetes mellitus    Type 2   Enlarged prostate    Environmental allergies    Environmental and seasonal allergies    GERD (gastroesophageal reflux disease)    Headache(784.0)    sinus and migraines   Hyperlipidemia    Hypertension    IBS (irritable bowel syndrome)    Low testosterone     S/P angioplasty with stent to distal LCX and mid LCX both DES 07/25/23 07/26/2023   Sleep apnea    has used BiPAP in past; last sleep study >10 years     Family History  Problem Relation Age of Onset   Cancer Mother        lung   Cancer Father        prostate, mets to bone, lung   Cancer Brother        melanoma   Heart disease Maternal Grandmother        MI   Lung disease Maternal Grandfather        black lung   Heart disease Paternal Grandmother    Stroke Paternal Grandmother     Heart disease Paternal Grandfather    Heart disease Brother 9       MI   Diabetes Brother      Social History   Socioeconomic History   Marital status: Single    Spouse name: Not on file   Number of children: Not on file   Years of education: Not on file   Highest education level: Not on file  Occupational History   Not on file  Tobacco Use   Smoking status: Former    Current packs/day: 0.00    Average packs/day: 1 pack/day for 10.0 years (10.0 ttl pk-yrs)    Types: Cigarettes    Start date: 04/20/1982    Quit date: 04/20/1992    Years since quitting: 32.0   Smokeless tobacco: Never  Vaping Use   Vaping status: Never Used  Substance and Sexual Activity   Alcohol use: No   Drug use: No   Sexual activity: Not on file  Other Topics Concern   Not on file  Social History Narrative   Not on file   Social Drivers of Health   Financial Resource Strain: Not on file  Food Insecurity: No Food Insecurity (04/12/2024)   Hunger Vital Sign  Worried About Programme researcher, broadcasting/film/video in the Last Year: Never true    Ran Out of Food in the Last Year: Never true  Transportation Needs: No Transportation Needs (04/12/2024)   PRAPARE - Administrator, Civil Service (Medical): No    Lack of Transportation (Non-Medical): No  Physical Activity: Unknown (03/20/2019)   Exercise Vital Sign    Days of Exercise per Week: 0 days    Minutes of Exercise per Session: Not on file  Stress: Not on file  Social Connections: Unknown (05/11/2022)   [facility] from [facility]   Social Network    Social Network: Not on file  Intimate Partner Violence: Not At Risk (04/12/2024)   Humiliation, Afraid, Rape, and Kick questionnaire    Fear of Current or Ex-Partner: No    Emotionally Abused: No    Physically Abused: No    Sexually Abused: No    No inhaled exposures 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   Allergies  Allergen Reactions   Pravastatin Other (See Comments)    Joint  myalgias   Trazodone And Nefazodone     Had restless legs   Ezetimibe  Other (See Comments)    Myalgias, joint pain   Hydrochlorothiazide  Other (See Comments)    lightheadedness   Latex Rash   Metformin  And Related Diarrhea     Outpatient Medications Prior to Visit  Medication Sig Dispense Refill   acetaminophen  (TYLENOL ) 500 MG tablet Take 2 tablets (1,000 mg total) by mouth every 6 (six) hours as needed for moderate pain (pain score 4-6). (Patient taking differently: Take 1,000 mg by mouth as needed for moderate pain (pain score 4-6).) 30 tablet 2   apixaban  (ELIQUIS ) 5 MG TABS tablet Take 1 tablet (5 mg total) by mouth 2 (two) times daily. 180 tablet 3   atorvastatin  (LIPITOR) 40 MG tablet Take 1 tablet (40 mg total) by mouth daily. 90 tablet 3   budesonide -formoterol  (SYMBICORT ) 160-4.5 MCG/ACT inhaler Inhale 2 puffs into the lungs 2 (two) times daily. 10.2 g 2   carvedilol  (COREG ) 25 MG tablet Take 1 tablet (25 mg total) by mouth 2 (two) times daily. 180 tablet 1   cetirizine  (ZYRTEC ) 10 MG tablet Take 1 tablet (10 mg total) by mouth daily. 30 tablet 11   clopidogrel  (PLAVIX ) 75 MG tablet Take 1 tablet (75 mg total) by mouth daily. 90 tablet 3   Continuous Glucose Sensor (DEXCOM G7 SENSOR) MISC Change sensor every 10 days 9 each 3   empagliflozin  (JARDIANCE ) 25 MG TABS tablet Take 1 tablet (25 mg total) by mouth daily before breakfast. 90 tablet 3   fluticasone  (FLONASE ) 50 MCG/ACT nasal spray Place 2 sprays into both nostrils daily. (Patient taking differently: Place 2 sprays into both nostrils daily as needed for allergies.) 16 g 6   insulin  glargine (LANTUS  SOLOSTAR) 100 UNIT/ML Solostar Pen Inject 60 Units into the skin daily. (Patient taking differently: Inject 60 Units into the skin every evening.) 70 mL 3   insulin  lispro (HUMALOG  KWIKPEN) 200 UNIT/ML KwikPen Max daily 100 units (Patient taking differently: Inject 40 Units into the skin 2 (two) times daily with a meal. May inject a  third 40 unit dose as needed for high blood sugar) 45 mL 3   Insulin  Pen Needle (UNIFINE PENTIPS) 32G X 4 MM MISC Use with Levemir  100 each 0   Insulin  Pen Needle 32G X 4 MM MISC 1 Device by Does not apply route in the morning, at  noon, in the evening, and at bedtime. 400 each 3   nitroGLYCERIN  (NITROSTAT ) 0.4 MG SL tablet Place 1 tablet (0.4 mg total) under the tongue every 5 (five) minutes as needed for chest pain. 25 tablet 4   REPATHA  SURECLICK 140 MG/ML SOAJ ADMINISTER 1 ML UNDER THE SKIN EVERY 14 DAYS. 6 mL 3   rOPINIRole  (REQUIP ) 1 MG tablet Take 1 tablet (1 mg total) by mouth daily. 90 tablet 3   Semaglutide , 1 MG/DOSE, 4 MG/3ML SOPN Inject 1 mg as directed once a week. 9 mL 3   triamcinolone  cream (KENALOG ) 0.1 % Apply 1 Application topically 2 (two) times daily. (Patient taking differently: Apply 1 Application topically 2 (two) times daily as needed (rash).) 30 g 0   vortioxetine  HBr (TRINTELLIX ) 20 MG TABS tablet Take 1 tablet (20 mg total) by mouth daily. 30 tablet 0   pantoprazole  (PROTONIX ) 40 MG tablet Take 1 tablet (40 mg total) by mouth 2 (two) times daily before a meal for 14 days, THEN 1 tablet (40 mg total) daily for 14 days. 42 tablet 0   guaiFENesin  (MUCINEX ) 600 MG 12 hr tablet Take 1 tablet (600 mg total) by mouth 2 (two) times daily for 15 days. (Patient not taking: Reported on 04/26/2024) 30 tablet 0   No facility-administered medications prior to visit.        Objective:   Physical Exam  Vitals:   04/26/24 1038 04/26/24 1039  BP: (!) 144/85 132/80  Pulse: 81   SpO2: 92%   Weight: 202 lb 9.6 oz (91.9 kg)   Height: 5\' 10"  (1.778 m)    Gen: Pleasant, well-nourished, in no distress,  normal affect  ENT: No lesions,  mouth clear,  oropharynx clear, no postnasal drip  Neck: No JVD, no stridor  Lungs: No use of accessory muscles, no crackles or wheezing on normal respiration, no wheeze on forced expiration  Cardiovascular: RRR, heart sounds normal, no murmur or  gallops, no peripheral edema  Musculoskeletal: No deformities, no cyanosis or clubbing  Neuro: alert, awake, non focal  Skin: Warm, no lesions or rash        Assessment & Plan:  Dyspnea Difficult to tease out the overlap between chronic symptoms of upper airway irritation, cough and recent acute illness that required hospitalization.  He he had viral symptoms, possible viral versus bacterial pneumonia with an abnormal CT scan of the chest.  He is now left with persistent upper airway symptoms, cough, noise in his throat, vocal changes.  He was sent home on Symbicort , pantoprazole .  I am going to try stopping the Symbicort  because I think most of his symptoms currently are upper airway in nature.  He needs pulmonary function testing to determine whether there is true asthma present.  In the meantime we will try to treat contributors to upper airway irritation as this seems to be the active process currently.  Other allergic rhinitis Add back fluticasone  nasal spray to his antihistamine  GERD (gastroesophageal reflux disease) Does not seem to still be clinically active but question occult GERD contributing to his chronic upper airway irritation and cough, vocal changes.  He is willing to stay on PPI for now we will reorder for him.  Abnormal CT of the chest With groundglass traits, pneumonitis during recent hospitalization.  Plan for repeat scan in about 8 weeks to look for resolution.  If there are persistent findings then we will plan workup.   Racheal Buddle, MD, PhD 04/26/2024, 1:24 PM [facility] [phone] or if no answer before 7:00PM call [phone] For any issues after 7:00PM please call eLink [phone]

## 2024-05-01 ENCOUNTER — Encounter: Payer: Self-pay | Admitting: Internal Medicine

## 2024-05-01 ENCOUNTER — Ambulatory Visit (INDEPENDENT_AMBULATORY_CARE_PROVIDER_SITE_OTHER): Admitting: Internal Medicine

## 2024-05-01 VITALS — BP 120/84 | HR 81 | Ht 70.0 in | Wt 204.0 lb

## 2024-05-01 DIAGNOSIS — E1142 Type 2 diabetes mellitus with diabetic polyneuropathy: Secondary | ICD-10-CM | POA: Diagnosis not present

## 2024-05-01 DIAGNOSIS — Z794 Long term (current) use of insulin: Secondary | ICD-10-CM

## 2024-05-01 DIAGNOSIS — E1159 Type 2 diabetes mellitus with other circulatory complications: Secondary | ICD-10-CM | POA: Diagnosis not present

## 2024-05-01 LAB — POCT GLYCOSYLATED HEMOGLOBIN (HGB A1C): Hemoglobin A1C: 5.5 % (ref 4.0–5.6)

## 2024-05-01 MED ORDER — LANTUS SOLOSTAR 100 UNIT/ML ~~LOC~~ SOPN: 50.0000 [IU] | PEN_INJECTOR | Freq: Every day | SUBCUTANEOUS | 3 refills | Status: AC

## 2024-05-01 MED ORDER — UNIFINE PENTIPS 32G X 4 MM MISC: 1.0000 | Freq: Four times a day (QID) | 3 refills | Status: AC

## 2024-05-01 MED ORDER — SEMAGLUTIDE (2 MG/DOSE) 8 MG/3ML ~~LOC~~ SOPN: 2.0000 mg | PEN_INJECTOR | SUBCUTANEOUS | 3 refills | Status: AC

## 2024-05-01 MED ORDER — HUMALOG KWIKPEN 200 UNIT/ML ~~LOC~~ SOPN: PEN_INJECTOR | SUBCUTANEOUS | 3 refills | Status: AC

## 2024-05-01 NOTE — Patient Instructions (Signed)
-   Continue Jardiance  25 mg, 1 tablet every morning ,  - Increase Ozempic  2 mg once weekly - Decrease Lantus  50 units - Decrease Humalog  36 units with each meal -Humalog  correctional insulin : ADD extra units on insulin  to your meal-time Humalog   dose if your blood sugars are higher than 150. Use the scale below to help guide you before each meal.  Blood sugar before meal Number of units to inject  Less than 150 0 unit  151 -  170 1 units  171 -  190 2 units  191 -  210 3 units  211 -  230 4 units  231 -  250 5 units  251 -  270 6 units  271 -  290 7 units  291 -  310 8 units       HOW TO TREAT LOW BLOOD SUGARS (Blood sugar LESS THAN 70 MG/DL) Please follow the RULE OF 15 for the treatment of hypoglycemia treatment (when your (blood sugars are less than 70 mg/dL)   STEP 1: Take 15 grams of carbohydrates when your blood sugar is low, which includes:  3-4 GLUCOSE TABS  OR 3-4 OZ OF JUICE OR REGULAR SODA OR ONE TUBE OF GLUCOSE GEL    STEP 2: RECHECK blood sugar in 15 MINUTES STEP 3: If your blood sugar is still low at the 15 minute recheck --> then, go back to STEP 1 and treat AGAIN with another 15 grams of carbohydrates.

## 2024-05-01 NOTE — Progress Notes (Signed)
 Name: [name] [name]  Age/ Sex: 63 y.o., male   MRN/ DOB: [id number], 1961/11/07     PCP: Ronna Coho, MD   Reason for Endocrinology Evaluation: Type 2 Diabetes Mellitus  Initial Endocrine Consultative Visit: 11/13/2020    PATIENT IDENTIFIER: [name] [name] is a 63 y.o. male with a past medical history of T2DM, CAD . The patient has followed with Endocrinology clinic since 11/13/2020 for consultative assistance with management of his diabetes.  DIABETIC HISTORY:  [name] [name] was diagnosed with DM in 2008, Metformin - diarrhea , glipizide - hypoglycemia. His hemoglobin A1c has ranged from 8.4% in 2021, peaking at 10.5% in 2020.  Patient was lost to follow-up for approximately 2 years from 2022 until his return to our office by 08/2023  He was started by his PCPs office on basal/prandial insulin  by 02/2023 with an A1c of >14.0%  Ozempic  started 08/2023  HYPOGONADISM HISTORY: Has been on testosterone  for many years,   He has no prior radiation or surgeries on testicles.  He is not on opiates  He asked me to take this over 06/2021   By 10/2021 he asked to switch to topical testosterone .   Testosterone  was on hold in 2024 due to MI  SUBJECTIVE:   During the last visit (01/22/2024): A1c 6.1%      Today (05/01/2024): [name] [name]  is here for a follow up on diabetes management and hypogonadism.  He checks his glucose multiple times a day through dexcom, he is symptomatic in the 40's   Patient continues to follow-up with cardiology for CAD and A-fib, he is s/p cardioversion 01/2024, continues with palpitations  He presented to the ED with community-acquired pneumonia 03/2024, was evaluated by pulmonary, continues with fatigue and breathing issues   Denies nausea or vomiting  Denies  constipation or  diarrhea , on probiotics    HOME ENDOCRINE  REGIMEN:  Jardiance  25 mg daily  Ozempic  1 mg weekly Lantus  60 units daily  Humalog  40 units TIDQAC- takes 50 units      Statin: yes ACE-I/ARB: yes Prior Diabetic Education: yes  CONTINUOUS GLUCOSE MONITORING RECORD INTERPRETATION    Dates of Recording: 04/18/2024  Sensor description: dexcom  Results statistics:   CGM use % of time 95  Average and SD 113/36  Time in range     86   %  % Time Above 180 5  % Time above 250 0  % Time Below target 7   Glycemic patterns summary: Patient has been noted with hypoglycemia overnight and optimal BG's throughout the day  Hyperglycemic episodes postprandial  Hypoglycemic episodes occurred during the day and night  Overnight periods: Trends down     DIABETIC COMPLICATIONS: Microvascular complications:   Denies: CKD, retinopathy, neuropathy Last Eye Exam: Completed 01/2021  Macrovascular complications:  CAD (06/2023), s/p PCI with stent placement 10/2023 Denies:  CVA, PVD   HISTORY:  Past Medical History:  Past Medical History:  Diagnosis Date   Anxiety    Coronary artery disease    Depression    Diabetes mellitus    Type 2   Enlarged prostate    Environmental allergies    Environmental and seasonal allergies    GERD (gastroesophageal reflux disease)    Headache(784.0)    sinus and migraines   Hyperlipidemia    Hypertension    IBS (irritable bowel syndrome)    Low testosterone     S/P angioplasty with stent to distal LCX and mid LCX both DES 07/25/23 07/26/2023  Sleep apnea    has used BiPAP in past; last sleep study >10 years   Past Surgical History:  Past Surgical History:  Procedure Laterality Date   ATRIAL FIBRILLATION ABLATION N/A 02/01/2024   Procedure: ATRIAL FIBRILLATION ABLATION;  Surgeon: Ardeen Kohler, MD;  Location: [facility] INVASIVE CV LAB;  Service: Cardiovascular;  Laterality: N/A;   COLONOSCOPY  09/2018   normal, repeat in 10 years per patient.  [place], [place]   CORONARY STENT INTERVENTION N/A 11/10/2023   Procedure: CORONARY STENT INTERVENTION;  Surgeon: Arnoldo Lapping, MD;  Location: [facility] INVASIVE CV LAB;  Service:  Cardiovascular;  Laterality: N/A;   CORONARY ULTRASOUND/IVUS N/A 11/10/2023   Procedure: Coronary Ultrasound/IVUS;  Surgeon: Arnoldo Lapping, MD;  Location: [facility] INVASIVE CV LAB;  Service: Cardiovascular;  Laterality: N/A;   CORONARY/GRAFT ACUTE MI REVASCULARIZATION N/A 07/25/2023   Procedure: Coronary/Graft Acute MI Revascularization;  Surgeon: Arnoldo Lapping, MD;  Location: [facility] INVASIVE CV LAB;  Service: Cardiovascular;  Laterality: N/A;   KNEE ARTHROSCOPY Left    LEFT HEART CATH AND CORONARY ANGIOGRAPHY N/A 07/25/2023   Procedure: LEFT HEART CATH AND CORONARY ANGIOGRAPHY;  Surgeon: Arnoldo Lapping, MD;  Location: [facility] INVASIVE CV LAB;  Service: Cardiovascular;  Laterality: N/A;   RENAL ANGIOGRAPHY N/A 07/16/2020   Procedure: RENAL ANGIOGRAPHY;  Surgeon: Knox Perl, MD;  Location: [facility] INVASIVE CV LAB;  Service: Cardiovascular;  Laterality: N/A;   SACROILIAC JOINT FUSION Right 05/02/2014   Procedure: SACROILIAC JOINT FUSION;  Surgeon: Estevan Helper, MD;  Location: [facility] OR;  Service: Orthopedics;  Laterality: Right;  Right sided sacroiliac joint fusion   TONSILLECTOMY     uvula removed   Social History:  reports that he quit smoking about 32 years ago. His smoking use included cigarettes. He started smoking about 42 years ago. He has a 10 pack-year smoking history. He has never used smokeless tobacco. He reports that he does not drink alcohol and does not use drugs. Family History:  Family History  Problem Relation Age of Onset   Cancer Mother        lung   Cancer Father        prostate, mets to bone, lung   Cancer Brother        melanoma   Heart disease Maternal Grandmother        MI   Lung disease Maternal Grandfather        black lung   Heart disease Paternal Grandmother    Stroke Paternal Grandmother    Heart disease Paternal Grandfather    Heart disease Brother 25       MI   Diabetes Brother      HOME MEDICATIONS: Allergies as of 05/01/2024       Reactions   Pravastatin Other  (See Comments)   Joint myalgias   Trazodone And Nefazodone    Had restless legs   Ezetimibe  Other (See Comments)   Myalgias, joint pain   Hydrochlorothiazide  Other (See Comments)   lightheadedness   Latex Rash   Metformin  And Related Diarrhea        Medication List        Accurate as of May 01, 2024  7:40 AM. If you have any questions, ask your nurse or doctor.          Acetaminophen  Extra Strength 500 MG Tabs Take 2 tablets (1,000 mg total) by mouth every 6 (six) hours as needed for moderate pain (pain score 4-6). What changed: when to take this   apixaban  5 MG  Tabs tablet Commonly known as: ELIQUIS  Take 1 tablet (5 mg total) by mouth 2 (two) times daily.   atorvastatin  40 MG tablet Commonly known as: LIPITOR Take 1 tablet (40 mg total) by mouth daily.   carvedilol  25 MG tablet Commonly known as: COREG  Take 1 tablet (25 mg total) by mouth 2 (two) times daily.   cetirizine  10 MG tablet Commonly known as: ZYRTEC  Take 1 tablet (10 mg total) by mouth daily.   clopidogrel  75 MG tablet Commonly known as: Plavix  Take 1 tablet (75 mg total) by mouth daily.   Dexcom G7 Sensor Misc Change sensor every 10 days   empagliflozin  25 MG Tabs tablet Commonly known as: Jardiance  Take 1 tablet (25 mg total) by mouth daily before breakfast.   fluticasone  50 MCG/ACT nasal spray Commonly known as: FLONASE  Place 2 sprays into both nostrils daily. What changed:  when to take this reasons to take this   HumaLOG  KwikPen 200 UNIT/ML KwikPen Generic drug: insulin  lispro Max daily 100 units What changed:  how much to take how to take this when to take this additional instructions   Lantus  SoloStar 100 UNIT/ML Solostar Pen Generic drug: insulin  glargine Inject 60 Units into the skin daily. What changed: when to take this   nitroGLYCERIN  0.4 MG SL tablet Commonly known as: NITROSTAT  Place 1 tablet (0.4 mg total) under the tongue every 5 (five) minutes as needed for  chest pain.   pantoprazole  40 MG tablet Commonly known as: PROTONIX  Take 1 tablet (40 mg total) by mouth daily.   Repatha  SureClick 140 MG/ML Soaj Generic drug: Evolocumab  ADMINISTER 1 ML UNDER THE SKIN EVERY 14 DAYS.   rOPINIRole  1 MG tablet Commonly known as: REQUIP  Take 1 tablet (1 mg total) by mouth daily.   Semaglutide  (1 MG/DOSE) 4 MG/3ML Sopn Inject 1 mg as directed once a week.   Symbicort  160-4.5 MCG/ACT inhaler Generic drug: budesonide -formoterol  Inhale 2 puffs into the lungs 2 (two) times daily.   triamcinolone  cream 0.1 % Commonly known as: KENALOG  Apply 1 Application topically 2 (two) times daily. What changed:  when to take this reasons to take this   Trintellix  20 MG Tabs tablet Generic drug: vortioxetine  HBr Take 1 tablet (20 mg total) by mouth daily.   Unifine Pentips 32G X 4 MM Misc Generic drug: Insulin  Pen Needle Use with Levemir    Insulin  Pen Needle 32G X 4 MM Misc 1 Device by Does not apply route in the morning, at noon, in the evening, and at bedtime.         OBJECTIVE:   Vital Signs: BP 120/84 (BP Location: Right Arm, Patient Position: Sitting, Cuff Size: Normal)   Pulse 81   Ht 5\' 10"  (1.778 m)   Wt 204 lb (92.5 kg)   SpO2 98%   BMI 29.27 kg/m   Wt Readings from Last 3 Encounters:  05/01/24 204 lb (92.5 kg)  04/26/24 202 lb 9.6 oz (91.9 kg)  04/11/24 188 lb (85.3 kg)     Exam: General: Pt appears well and is in NAD  Lungs: Clear with good BS bilat   Heart: RRR   Extremities: No pretibial edema.  Neuro: MS is good with appropriate affect, pt is alert and Ox3    DM Foot Exam 09/21/2023 The skin of the feet is intact without sores or ulcerations. The pedal pulses are 2+ on right and 2+ on left. The sensation is decreased  to a screening 5.07, 10 gram monofilament on the right toes  DATA REVIEWED:  Lab Results  Component Value Date   HGBA1C 5.5 05/01/2024   HGBA1C 6.1 (A) 01/26/2024   HGBA1C 6.3 (H) 11/11/2023     Latest Reference Range & Units 04/12/24 05:05  Sodium 135 - 145 mmol/L 138  Potassium 3.5 - 5.1 mmol/L 4.2  Chloride 98 - 111 mmol/L 109  CO2 22 - 32 mmol/L 23  Glucose 70 - 99 mg/dL 295 (H)  BUN 6 - 20 mg/dL 21 (H)  Creatinine 6.21 - 1.24 mg/dL 3.08  Calcium  8.9 - 10.3 mg/dL 9.0  Anion gap 5 - 15  6  GFR, Estimated >60 mL/min >60    ASSESSMENT / PLAN / RECOMMENDATIONS:   1) Type 2 Diabetes Mellitus, Optimally  controlled  , With Neuropathic  and macrovascular complications - Most recent A1c of 5.5 %. Goal A1c < 7.0 %.   -Patient continues with recurrent hypoglycemia -I did discuss with the patient the consequences of hypoglycemia including fatality -Patient has been using more prandial insulin  than previously prescribed, I did emphasize the importance of reducing Humalog  especially in the setting of increasing Ozempic , I will provide him with a correction scale to be used before each meal for hyperglycemia -I will decrease his insulin  as below - He will be provided with a correction scale to be used as needed before each meal -I will increase Ozempic     MEDICATIONS: - Continue Jardiance  25 mg daily  -Increase Ozempic  2 mg weekly -Decrease Lantus  50 units daily  -Decrease Humalog  36 units 3 times daily before every meal   EDUCATION / INSTRUCTIONS: BG monitoring instructions: Patient is instructed to check his blood sugars 3 times a day. Call [facility] if: BG persistently < 70  I reviewed the Rule of 15 for the treatment of hypoglycemia in detail with the patient. Literature supplied.    2) Diabetic complications:  Eye: Does not have known diabetic retinopathy.  Neuro/ Feet: Does have known diabetic peripheral neuropathy based on foot exam 12/15/2021 Renal: Patient does not have known baseline CKD. He   is  on an ACEI/ARB at present.    3) Hypogonadism:   -This has been diagnosed many years ago, unknown etiology - Due to issues  with injectable  testosterone  administration he requested  to switch to topical testosterone  in 2022 -He has not been on testosterone  therapy for over a year, given recent ST elevation MI, will have to postpone this for at least 6 months   Medication N/a   4) CAD/Dyslipidemia:  - Per Cardiology    F/U in 4 months   I spent 25 minutes preparing to see the patient by review of recent labs, imaging and procedures, obtaining and reviewing separately obtained history, communicating with the patient/family or caregiver, ordering medications, tests or procedures, and documenting clinical information in the EHR including the differential Dx, treatment, and any further evaluation and other management   Signed electronically by: Natale Bail, MD  [facility]  [facility] [address] [place] [place], [place] [address] Phone: [phone] FAX: [phone]   CC: Ronna Coho, MD [address] [place] [address] Phone: [phone]  Fax: [phone]  Return to Endocrinology clinic as below: Future Appointments  Date Time Provider Department Center  05/01/2024  7:50 AM Tanvi Gatling, Julian Obey, MD [facility] None  05/02/2024  8:00 AM Tylene Galla, PA-C [facility] H&V  05/04/2024  3:00 PM [facility] PFT ROOM [facility] None  05/15/2024  9:30 AM [place],  Evonnie Hoit, MD [facility] None  06/05/2024 11:20 AM GI-315 CT 2 GI-315CT [facility] [address] [facility]  06/08/2024  1:00 PM Byrum, Delora Ferry, MD [facility] None

## 2024-05-01 NOTE — Progress Notes (Unsigned)
  Electrophysiology Office Note:   Date:  05/02/2024  ID:  [name] [name], DOB 08-05-1961, MRN [id number]  Primary Cardiologist: Arnoldo Lapping, MD Electrophysiologist: Ardeen Kohler, MD      History of Present Illness:   [name] [name] is a 63 y.o. male with h/o AF, HTN, CAD s/p STEMI with PCI, OSA on BiPAP, DM II, anxiety / depression  seen today for routine electrophysiology follow-up s/p 02/01/2024.  Since last being seen in our clinic the patient reports doing OK. He has had a couple of episodes of tachy-palpitations, but his BP cuff only captures an "error".  BP has also been high at home, in the 160s systolic in the afternoons. Otherwise, he denies chest pain, dyspnea, PND, orthopnea, nausea, vomiting, dizziness, syncope, edema, weight gain, or early satiety.   Has upcoming endoscopy. Needs to hold plavix /Eliquis   Review of systems complete and found to be negative unless listed in HPI.   EP Information / Studies Reviewed:    EKG is ordered today. Personal review as below.  EKG Interpretation Date/Time:  Tuesday May 02 2024 08:16:33 EDT Ventricular Rate:  81 PR Interval:  146 QRS Duration:  90 QT Interval:  384 QTC Calculation: 446 R Axis:   108  Text Interpretation: Normal sinus rhythm Anterolateral infarct (cited on or before 11-Apr-2024) T wave abnormality, consider inferior ischemia When compared with ECG of 11-Apr-2024 10:29, Non-specific change in ST segment in Inferior leads Confirmed by Pilar Bridge [phone]) on 05/02/2024 8:18:49 AM    Arrhythmia/Device History S/p PVI and posterior wall ablation 02/01/2024   Physical Exam:   VS:  BP 138/76   Pulse 91   Ht 5\' 10"  (1.778 m)   Wt 204 lb 9.6 oz (92.8 kg)   SpO2 93%   BMI 29.36 kg/m    Wt Readings from Last 3 Encounters:  05/02/24 204 lb 9.6 oz (92.8 kg)  05/01/24 204 lb (92.5 kg)  04/26/24 202 lb 9.6 oz (91.9 kg)     GEN: No acute distress NECK: No JVD; No carotid bruits CARDIAC: Regular rate and rhythm, no  murmurs, rubs, gallops RESPIRATORY:  Clear to auscultation without rales, wheezing or rhonchi  ABDOMEN: Soft, non-tender, non-distended EXTREMITIES:  No edema; No deformity   ASSESSMENT AND PLAN:    Paroxysmal AF EKG today shows NSR Continue Eliquis  5 mg BID for CHA2DS2VASc of at least 3 OK to hold eliquis  for 48 hours per protocol, then will need to resume.  He is interested in coming off of this, so will bring back to discuss Watchman with Dr. Daneil Dunker  Secondary hypercoagulable state Pt on Eliquis  as above   HTN Stable on current regimen   OSA  Encouraged nightly CPAP    CAD OK to hold Plavix  for procedure starting on 5/13.  Will clarify with general team if he needs to resume after.   Cardiac Clearance for Inspire Implant No concerns from a cardiac perspective.  The patient is at low risk to proceed without further work up.  If the patient has new chest pain or SOB prior to surgery, they should be revaluated. CANNOT hold Plavix  until 5/13, if it needs to be held longer than 1 day, surgery will need to be moved back from 5/14. OK to hold eliquis  x 48 hours, then should resume as soon as safe to do so  Follow up with Dr. Daneil Dunker in 3-4 months  Signed, Tylene Galla, PA-C

## 2024-05-02 ENCOUNTER — Encounter: Payer: Self-pay | Admitting: Student

## 2024-05-02 ENCOUNTER — Ambulatory Visit: Payer: Medicaid Other | Attending: Student | Admitting: Student

## 2024-05-02 VITALS — BP 138/76 | HR 91 | Ht 70.0 in | Wt 204.6 lb

## 2024-05-02 DIAGNOSIS — G4733 Obstructive sleep apnea (adult) (pediatric): Secondary | ICD-10-CM | POA: Diagnosis present

## 2024-05-02 DIAGNOSIS — Z01818 Encounter for other preprocedural examination: Secondary | ICD-10-CM | POA: Diagnosis present

## 2024-05-02 DIAGNOSIS — D6869 Other thrombophilia: Secondary | ICD-10-CM | POA: Diagnosis present

## 2024-05-02 DIAGNOSIS — I480 Paroxysmal atrial fibrillation: Secondary | ICD-10-CM | POA: Diagnosis not present

## 2024-05-02 DIAGNOSIS — I2510 Atherosclerotic heart disease of native coronary artery without angina pectoris: Secondary | ICD-10-CM | POA: Diagnosis present

## 2024-05-02 DIAGNOSIS — I10 Essential (primary) hypertension: Secondary | ICD-10-CM

## 2024-05-02 MED ORDER — LOSARTAN POTASSIUM 25 MG PO TABS: 25.0000 mg | ORAL_TABLET | Freq: Every day | ORAL | 3 refills | Status: DC

## 2024-05-02 NOTE — Patient Instructions (Signed)
 Medication Instructions:  Start losartan 25 mg daily at bedtime. *If you need a refill on your cardiac medications before your next appointment, please call your pharmacy*  Lab Work: None ordered If you have labs (blood work) drawn today and your tests are completely normal, you will receive your results only by: MyChart Message (if you have MyChart) OR A paper copy in the mail If you have any lab test that is abnormal or we need to change your treatment, we will call you to review the results.  Follow-Up: At [facility], you and your health needs are our priority.  As part of our continuing mission to provide you with exceptional heart care, our providers are all part of one team.  This team includes your primary Cardiologist (physician) and Advanced Practice Providers or APPs (Physician Assistants and Nurse Practitioners) who all work together to provide you with the care you need, when you need it.  Your next appointment:   3-4 month(s)  Provider:   Ardeen Kohler, MD  to discuss Watchman procedure

## 2024-05-03 NOTE — Progress Notes (Signed)
 Pt called to inquire about when to stop taking Plavix  and Eliquis  before his upcoming endoscopy procedure on 04/30/13/25. Per the cardiologist note from 05/02/24, pt is to take the last Eliquis  on 05/08/24, and take the last Plavix  on 05/09/24. Please see note for clarification if not correct.

## 2024-05-04 ENCOUNTER — Ambulatory Visit: Admitting: Emergency Medicine

## 2024-05-04 DIAGNOSIS — J189 Pneumonia, unspecified organism: Secondary | ICD-10-CM

## 2024-05-04 LAB — PULMONARY FUNCTION TEST
DL/VA % pred: 88 %
DL/VA: 3.71 ml/min/mmHg/L
DLCO cor % pred: 79 %
DLCO cor: 21.82 ml/min/mmHg
DLCO unc % pred: 82 %
DLCO unc: 22.75 ml/min/mmHg
FEF 25-75 Post: 3.26 L/s
FEF 25-75 Pre: 2.96 L/s
FEF2575-%Change-Post: 10 %
FEF2575-%Pred-Post: 111 %
FEF2575-%Pred-Pre: 101 %
FEV1-%Change-Post: 2 %
FEV1-%Pred-Post: 90 %
FEV1-%Pred-Pre: 87 %
FEV1-Post: 3.23 L
FEV1-Pre: 3.14 L
FEV1FVC-%Change-Post: 1 %
FEV1FVC-%Pred-Pre: 104 %
FEV6-%Change-Post: 1 %
FEV6-%Pred-Post: 89 %
FEV6-%Pred-Pre: 88 %
FEV6-Post: 4.05 L
FEV6-Pre: 4 L
FEV6FVC-%Change-Post: 0 %
FEV6FVC-%Pred-Post: 104 %
FEV6FVC-%Pred-Pre: 104 %
FVC-%Change-Post: 1 %
FVC-%Pred-Post: 85 %
FVC-%Pred-Pre: 84 %
FVC-Post: 4.05 L
FVC-Pre: 4 L
Post FEV1/FVC ratio: 80 %
Post FEV6/FVC ratio: 100 %
Pre FEV1/FVC ratio: 79 %
Pre FEV6/FVC Ratio: 100 %
RV % pred: 70 %
RV: 1.59 L
TLC % pred: 80 %
TLC: 5.63 L

## 2024-05-04 NOTE — Telephone Encounter (Signed)
 Cardiac risk assessment performed by Michaelle Adolphus, PA-C on 05/02/2024 office visit. Office note routed to requesting office.   Will remove from pre-op pool.   Lonell Rives. Kinzie Wickes, DNP, NP-C  05/04/2024, 10:54 AM  [facility] [address] Office [phone] Fax [phone]

## 2024-05-04 NOTE — Patient Instructions (Signed)
 Full PFT performed today.

## 2024-05-04 NOTE — Progress Notes (Signed)
 Full PFT performed today.

## 2024-05-08 ENCOUNTER — Other Ambulatory Visit: Payer: Self-pay

## 2024-05-08 ENCOUNTER — Encounter (HOSPITAL_COMMUNITY): Payer: Self-pay | Admitting: General Practice

## 2024-05-08 NOTE — Progress Notes (Signed)
 Per Pattie Borders at Dr. Adelia Adolphus office, patient will be proceeding with procedure on 5/14 and has been moved to "Outpatient in Bed."

## 2024-05-08 NOTE — Anesthesia Preprocedure Evaluation (Signed)
 Anesthesia Evaluation  Patient identified by MRN, date of birth, ID band Patient awake    Reviewed: Allergy  & Precautions, NPO status , Patient's Chart, lab work & pertinent test results, reviewed documented beta blocker date and time   History of Anesthesia Complications Negative for: history of anesthetic complications  Airway Mallampati: II  TM Distance: >3 FB Neck ROM: Full    Dental  (+) Caps, Dental Advisory Given   Pulmonary sleep apnea (does not use CPAP) , former smoker   breath sounds clear to auscultation       Cardiovascular hypertension, Pt. on medications and Pt. on home beta blockers (-) angina + CAD, + Past MI and + Cardiac Stents   Rhythm:Regular Rate:Normal  '24 ECHO: EF 50-55%, low normal LVF, inferolat and basal anterolat akinesis, mild LVH, Grade 1 DD, normal RVF no significant valvular abnormalities   Neuro/Psych  Headaches  Anxiety Depression       GI/Hepatic Neg liver ROS,GERD  Controlled,,  Endo/Other  diabetes (glu 138), Oral Hypoglycemic Agents, Insulin  Dependent  semaglutide   Renal/GU negative Renal ROS     Musculoskeletal  (+) Arthritis ,    Abdominal   Peds  Hematology Eliquis , plavix : 2d ago   Anesthesia Other Findings   Reproductive/Obstetrics                             Anesthesia Physical Anesthesia Plan  ASA: 3  Anesthesia Plan: MAC   Post-op Pain Management: Minimal or no pain anticipated   Induction:   PONV Risk Score and Plan: 1 and Treatment may vary due to age or medical condition and Ondansetron   Airway Management Planned: Natural Airway  Additional Equipment: None  Intra-op Plan:   Post-operative Plan:   Informed Consent: I have reviewed the patients History and Physical, chart, labs and discussed the procedure including the risks, benefits and alternatives for the proposed anesthesia with the patient or authorized representative who  has indicated his/her understanding and acceptance.     Dental advisory given  Plan Discussed with: CRNA and Surgeon  Anesthesia Plan Comments: (PAT note by Rudy Costain, PA-C: 63 year old male follows with cardiology for history of PAF on Eliquis  s/p ablation 02/01/2024, HTN, CAD s/p STEMI with DES to LCx (06/2023) and LAD (10/2023), OSA intolerant to CPAP.  Last seen by Michaelle Adolphus on 05/02/2024 and upcoming ENT surgery was discussed.  Per note, "Cardiac Clearance for Inspire Implant: No concerns from a cardiac perspective.  The patient is at low risk to proceed without further work up. If the patient has new chest pain or SOB prior to surgery, they should be revaluated. CANNOT hold Plavix  until 5/13, if it needs to be held longer than 1 day, surgery will need to be moved back from 5/14. OK to hold eliquis  x 48 hours, then should resume as soon as safe to do so."  Recent admission 4/15 through 04/12/2024 with complaint of persistent shortness of breath despite being on antibiotics as outpatient.  Symptoms felt consistent with postviral asthma.  Per discharge summary, "Postnasal drip and sleep apnea likely contributing.  Failed multiple courses of antibiotics as outpatient.  CT chest 4/9 with diffuse subpleural groundglass opacity and nodularity as well as nodular consolidation within right lower lobe, felt due to atypical infection.  Inflammatory process also considered although ESR is only 8.  Pulmonary was consulted.  Patient was initiated on Rocephin  and Zithromax  will be discontinued on discharge.Aaron Aas  Respiratory viral panel  negative.  BNP of 99.  Pending strep pneumonia and Legionella urinary antigens.  Received 1 dose of IV Solu-Medrol .  Will be continued on prednisone  taper on discharge as per pulmonary.  Adolfo Hooker to follow the patient as outpatient.  Continue Flonase  for possible postnasal drip."  He had subsequent follow-up with pulmonologist Dr. Baldwin Levee on 04/26/2024.  Per note, "Difficult to tease out the  overlap between chronic symptoms of upper airway irritation, cough and recent acute illness that required hospitalization.  He he had viral symptoms, possible viral versus bacterial pneumonia with an abnormal CT scan of the chest.  He is now left with persistent upper airway symptoms, cough, noise in his throat, vocal changes.  He was sent home on Symbicort , pantoprazole .  I am going to try stopping the Symbicort  because I think most of his symptoms currently are upper airway in nature.  He needs pulmonary function testing to determine whether there is true asthma present.  In the meantime we will try to treat contributors to upper airway irritation as this seems to be the active process currently."  He was recommended to have repeat CT scan in about 8 weeks to look for resolution of groundglass abnormalities.  PFTs 05/04/2024 showed FVC 84%, FEV1 87%, FEV1/FVC 79%, TLC 80%, RV 70%, DLCO unc 82%.  No bronchodilator response.  IDDM 2, A1c 5.5 on 05/01/2024.  BMP and CBC 04/12/2024 reviewed, unremarkable.  Patient reports last dose GLP-1 agonist 05/06/2024.  Patient will need day of surgery evaluation.  EKG 05/02/2024: Normal sinus rhythm.  Rate 81. Anterolateral infarct (cited on or before 11-Apr-2024). T wave abnormality, consider inferior ischemia  Cath and PCI 11/10/2023: Successful IVUS guided PCI of severe ulcerated stenosis in the proximal LAD, reducing 70% stenosis to 0% with a 4.0 x 16 mm Synergy DES  Recommendations: Continue clopidogrel  without interruption for at least another 6 months.  Resume apixaban  tomorrow.  No concomitant aspirin  until clopidogrel  discontinued.  Anticipate hospital discharge tomorrow morning.  IVUS findings: Reference vessel diameter 4.5 mm maximum.  Lesion is calcified and ulcerated also with concentric fibrous plaquing noted.  Lesion extends back to the ostium of the LAD.  Post PCI IVUS findings: Stent well-expanded, well apposed throughout with good proximal and  distal transitions.  TTE 07/26/2023: 1. Left ventricular ejection fraction, by estimation, is 50 to 55%. The  left ventricle has low normal function. The left ventricle demonstrates  regional wall motion abnormalities with basal inferolateral and basal  anterolateral akinesis. There is mild  concentric left ventricular hypertrophy. Left ventricular diastolic  parameters are consistent with Grade I diastolic dysfunction (impaired  relaxation).  2. Right ventricular systolic function is normal. The right ventricular  size is normal. Tricuspid regurgitation signal is inadequate for assessing  PA pressure.  3. The mitral valve is normal in structure. Trivial mitral valve  regurgitation. No evidence of mitral stenosis.  4. The aortic valve is tricuspid. There is mild calcification of the  aortic valve. Aortic valve regurgitation is not visualized. No aortic  stenosis is present.  5. The inferior vena cava is dilated in size with >50% respiratory  variability, suggesting right atrial pressure of 8 mmHg.   Cath and PCI 07/25/2023: 1. Acute inferolateral MI secondary to occlusion of the distal circumflex, treated successful with primary PCI using a 2.5x24 mm Synergy DES 2. Severe mid-circumflex stenosis, treated with a 3.5x16 mm Megatron DES (80%-->0) 3. Moderately severe ulcerative plaque in the proximal LAD of 70% 4. Nonobstructive RCA stenosis 5. Mild segmental  LV dysfunction with LVEF estimated at 50-55%  Recommend: post-MI medical therapy. Consider staged IVUS +/- PCI of the proximal LAD  )        Anesthesia Quick Evaluation

## 2024-05-08 NOTE — Progress Notes (Signed)
 Anesthesia Chart Review: Same day workup  63 year old male follows with cardiology for history of PAF on Eliquis  s/p ablation 02/01/2024, HTN, CAD s/p STEMI with DES to LCx (06/2023) and LAD (10/2023), OSA intolerant to CPAP.  Last seen by Michaelle Adolphus on 05/02/2024 and upcoming ENT surgery was discussed.  Per note, "Cardiac Clearance for Inspire Implant: No concerns from a cardiac perspective.  The patient is at low risk to proceed without further work up. If the patient has new chest pain or SOB prior to surgery, they should be revaluated. CANNOT hold Plavix  until 5/13, if it needs to be held longer than 1 day, surgery will need to be moved back from 5/14. OK to hold eliquis  x 48 hours, then should resume as soon as safe to do so."  Recent admission 4/15 through 04/12/2024 with complaint of persistent shortness of breath despite being on antibiotics as outpatient.  Symptoms felt consistent with postviral asthma.  Per discharge summary, "Postnasal drip and sleep apnea likely contributing.  Failed multiple courses of antibiotics as outpatient.  CT chest 4/9 with diffuse subpleural groundglass opacity and nodularity as well as nodular consolidation within right lower lobe, felt due to atypical infection.  Inflammatory process also considered although ESR is only 8.  Pulmonary was consulted.  Patient was initiated on Rocephin  and Zithromax  will be discontinued on discharge.Aaron Aas  Respiratory viral panel negative.  BNP of 99.  Pending strep pneumonia and Legionella urinary antigens.  Received 1 dose of IV Solu-Medrol .  Will be continued on prednisone  taper on discharge as per pulmonary.  Adolfo Hooker to follow the patient as outpatient.  Continue Flonase  for possible postnasal drip."  He had subsequent follow-up with pulmonologist Dr. Baldwin Levee on 04/26/2024.  Per note, "Difficult to tease out the overlap between chronic symptoms of upper airway irritation, cough and recent acute illness that required hospitalization.  He he had viral  symptoms, possible viral versus bacterial pneumonia with an abnormal CT scan of the chest.  He is now left with persistent upper airway symptoms, cough, noise in his throat, vocal changes.  He was sent home on Symbicort , pantoprazole .  I am going to try stopping the Symbicort  because I think most of his symptoms currently are upper airway in nature.  He needs pulmonary function testing to determine whether there is true asthma present.  In the meantime we will try to treat contributors to upper airway irritation as this seems to be the active process currently."  He was recommended to have repeat CT scan in about 8 weeks to look for resolution of groundglass abnormalities.  PFTs 05/04/2024 showed FVC 84%, FEV1 87%, FEV1/FVC 79%, TLC 80%, RV 70%, DLCO unc 82%.  No bronchodilator response.  IDDM 2, A1c 5.5 on 05/01/2024.  BMP and CBC 04/12/2024 reviewed, unremarkable.  Patient reports last dose GLP-1 agonist 05/06/2024.  Patient will need day of surgery evaluation.  EKG 05/02/2024: Normal sinus rhythm.  Rate 81. Anterolateral infarct (cited on or before 11-Apr-2024). T wave abnormality, consider inferior ischemia  Cath and PCI 11/10/2023: Successful IVUS guided PCI of severe ulcerated stenosis in the proximal LAD, reducing 70% stenosis to 0% with a 4.0 x 16 mm Synergy DES   Recommendations: Continue clopidogrel  without interruption for at least another 6 months.  Resume apixaban  tomorrow.  No concomitant aspirin  until clopidogrel  discontinued.  Anticipate hospital discharge tomorrow morning.   IVUS findings: Reference vessel diameter 4.5 mm maximum.  Lesion is calcified and ulcerated also with concentric fibrous plaquing noted.  Lesion extends  back to the ostium of the LAD.   Post PCI IVUS findings: Stent well-expanded, well apposed throughout with good proximal and distal transitions.  TTE 07/26/2023: 1. Left ventricular ejection fraction, by estimation, is 50 to 55%. The  left ventricle has low normal  function. The left ventricle demonstrates  regional wall motion abnormalities with basal inferolateral and basal  anterolateral akinesis. There is mild  concentric left ventricular hypertrophy. Left ventricular diastolic  parameters are consistent with Grade I diastolic dysfunction (impaired  relaxation).   2. Right ventricular systolic function is normal. The right ventricular  size is normal. Tricuspid regurgitation signal is inadequate for assessing  PA pressure.   3. The mitral valve is normal in structure. Trivial mitral valve  regurgitation. No evidence of mitral stenosis.   4. The aortic valve is tricuspid. There is mild calcification of the  aortic valve. Aortic valve regurgitation is not visualized. No aortic  stenosis is present.   5. The inferior vena cava is dilated in size with >50% respiratory  variability, suggesting right atrial pressure of 8 mmHg.   Cath and PCI 07/25/2023: Acute inferolateral MI secondary to occlusion of the distal circumflex, treated successful with primary PCI using a 2.5x24 mm Synergy DES Severe mid-circumflex stenosis, treated with a 3.5x16 mm Megatron DES (80%-->0) Moderately severe ulcerative plaque in the proximal LAD of 70% Nonobstructive RCA stenosis Mild segmental LV dysfunction with LVEF estimated at 50-55%   Recommend: post-MI medical therapy. Consider staged IVUS +/- PCI of the proximal LAD     Edilia Gordon [facility]/Anesthesiology Phone [phone] 05/08/2024 3:56 PM Adriana Hopping

## 2024-05-08 NOTE — Progress Notes (Signed)
 Attempted SDW call to patient.  Patient verified name, date of birth and procedure for Wednesday, 5/14.  When patient was asked who was bringing him to the procedure, patient stated "no one, he would be taking a service."  It was explained to the patient, that he would have to have someone drive him home and remain with him for 24 hours following the procedure because he would be receiving anesthesia.  Patient became verbally upset, told me that it was ridiculous,  to cancel the procedure and hung up the phone.     Left message with Dr. Adelia Adolphus office and sent message to Dr. Larkin Plumb.

## 2024-05-08 NOTE — Progress Notes (Signed)
 SDW CALL  Patient was given pre-op instructions over the phone. The opportunity was given for the patient to ask questions. No further questions asked. Patient verbalized understanding of instructions given.   PCP - Ronna Coho Cardiologist - Dr. Arnoldo Lapping EP- Dr. Ardeen Kohler  PPM/ICD - denies   Chest x-ray - 04/11/24 EKG - 05/02/24 Stress Test -  denies  ECHO -  07/26/23 Cardiac Cath - 11/10/23  Sleep Study - OSA+ but does not wear CPAP   Fasting Blood Sugar - low 100's Patient wears a Dexcom on his right arm.   Last dose of GLP1 agonist-  Semaglutide  - last dose was 05/06/24 GLP1 instructions: Patient instructed not to take another dose of Semaglutide   Last dose of Jardiance  was 5/12  Blood Thinner Instructions:  Patient states he was instructed to not take Eliquis  or Plavix  on 5/13.  Last dose of both was 5/12   ERAS Protcol - NPO   COVID TEST- n/a   Anesthesia review:  yes   Patient denies shortness of breath, fever, cough and chest pain over the phone call   All instructions explained to the patient, with a verbal understanding of the material. Patient agrees to go over the instructions while at home for a better understanding.     Patient states that he is taking transportation to and from the hospital.  Dr. Adelia Adolphus office was made aware and patient now states that he will be spending the night.  This was confirmed by Pattie Borders at Dr. Adelia Adolphus office.

## 2024-05-08 NOTE — Progress Notes (Signed)
 Received a call from the patient. Verbally upset and using inappropriate language regarding the need for a support person after surgery. After confirming the patient's identity I reviewed the schedule at [facility]  to discover he was scheduled at the Main OR for that date. I shared that he was not scheduled at my facility. I reinforced the policy and encouraged him to contact  Dr Adelia Adolphus office to explain that he does not have a person to stay with him after the procedure to discuss staying overnight at the facility. At that time the patient hung up.

## 2024-05-10 ENCOUNTER — Ambulatory Visit (HOSPITAL_COMMUNITY): Payer: Self-pay | Admitting: Physician Assistant

## 2024-05-10 ENCOUNTER — Ambulatory Visit (HOSPITAL_COMMUNITY)
Admission: RE | Admit: 2024-05-10 | Discharge: 2024-05-10 | Disposition: A | Discharge status: Home / Self Care | Attending: Otolaryngology | Admitting: Otolaryngology

## 2024-05-10 ENCOUNTER — Encounter (HOSPITAL_COMMUNITY): Payer: Self-pay | Admitting: *Deleted

## 2024-05-10 ENCOUNTER — Other Ambulatory Visit (INDEPENDENT_AMBULATORY_CARE_PROVIDER_SITE_OTHER): Payer: Self-pay | Admitting: Otolaryngology

## 2024-05-10 ENCOUNTER — Other Ambulatory Visit (HOSPITAL_COMMUNITY): Payer: Self-pay

## 2024-05-10 ENCOUNTER — Other Ambulatory Visit: Payer: Self-pay | Admitting: Student

## 2024-05-10 ENCOUNTER — Encounter (HOSPITAL_COMMUNITY)
Admission: RE | Disposition: A | Discharge status: Home / Self Care | Payer: Self-pay | Source: Home / Self Care | Attending: Otolaryngology

## 2024-05-10 ENCOUNTER — Ambulatory Visit (HOSPITAL_BASED_OUTPATIENT_CLINIC_OR_DEPARTMENT_OTHER): Payer: Self-pay | Admitting: Physician Assistant

## 2024-05-10 ENCOUNTER — Other Ambulatory Visit: Payer: Self-pay

## 2024-05-10 DIAGNOSIS — Z794 Long term (current) use of insulin: Secondary | ICD-10-CM | POA: Diagnosis not present

## 2024-05-10 DIAGNOSIS — Z789 Other specified health status: Secondary | ICD-10-CM

## 2024-05-10 DIAGNOSIS — I2510 Atherosclerotic heart disease of native coronary artery without angina pectoris: Secondary | ICD-10-CM | POA: Insufficient documentation

## 2024-05-10 DIAGNOSIS — I252 Old myocardial infarction: Secondary | ICD-10-CM | POA: Insufficient documentation

## 2024-05-10 DIAGNOSIS — E119 Type 2 diabetes mellitus without complications: Secondary | ICD-10-CM | POA: Insufficient documentation

## 2024-05-10 DIAGNOSIS — Z87891 Personal history of nicotine dependence: Secondary | ICD-10-CM

## 2024-05-10 DIAGNOSIS — Z7985 Long-term (current) use of injectable non-insulin antidiabetic drugs: Secondary | ICD-10-CM | POA: Diagnosis not present

## 2024-05-10 DIAGNOSIS — Z7984 Long term (current) use of oral hypoglycemic drugs: Secondary | ICD-10-CM | POA: Diagnosis not present

## 2024-05-10 DIAGNOSIS — K219 Gastro-esophageal reflux disease without esophagitis: Secondary | ICD-10-CM | POA: Diagnosis not present

## 2024-05-10 DIAGNOSIS — F419 Anxiety disorder, unspecified: Secondary | ICD-10-CM | POA: Diagnosis not present

## 2024-05-10 DIAGNOSIS — G4733 Obstructive sleep apnea (adult) (pediatric): Secondary | ICD-10-CM | POA: Diagnosis present

## 2024-05-10 DIAGNOSIS — F32A Depression, unspecified: Secondary | ICD-10-CM | POA: Diagnosis not present

## 2024-05-10 DIAGNOSIS — I10 Essential (primary) hypertension: Secondary | ICD-10-CM | POA: Diagnosis not present

## 2024-05-10 DIAGNOSIS — Z7902 Long term (current) use of antithrombotics/antiplatelets: Secondary | ICD-10-CM | POA: Diagnosis not present

## 2024-05-10 DIAGNOSIS — I480 Paroxysmal atrial fibrillation: Secondary | ICD-10-CM | POA: Insufficient documentation

## 2024-05-10 DIAGNOSIS — Z7901 Long term (current) use of anticoagulants: Secondary | ICD-10-CM | POA: Insufficient documentation

## 2024-05-10 HISTORY — PX: DRUG INDUCED ENDOSCOPY: SHX6808

## 2024-05-10 HISTORY — DX: Dyspnea, unspecified: R06.00

## 2024-05-10 HISTORY — DX: Pneumonia, unspecified organism: J18.9

## 2024-05-10 HISTORY — DX: Acute myocardial infarction, unspecified: I21.9

## 2024-05-10 LAB — GLUCOSE, CAPILLARY
Glucose-Capillary: 138 mg/dL — ABNORMAL HIGH (ref 70–99)
Glucose-Capillary: 156 mg/dL — ABNORMAL HIGH (ref 70–99)

## 2024-05-10 SURGERY — DRUG INDUCED SLEEP ENDOSCOPY
Anesthesia: Monitor Anesthesia Care | Site: Nose

## 2024-05-10 MED ORDER — LIDOCAINE 2% (20 MG/ML) 5 ML SYRINGE
INTRAMUSCULAR | Status: DC | PRN
  Administered 2024-05-10: 80 mg via INTRAVENOUS

## 2024-05-10 MED ORDER — OXYMETAZOLINE HCL 0.05 % NA SOLN
NASAL | Status: DC | PRN
  Administered 2024-05-10: 2 via TOPICAL

## 2024-05-10 MED ORDER — OXYCODONE HCL 5 MG/5ML PO SOLN: 5.0000 mg | Freq: Once | ORAL | Status: DC | PRN

## 2024-05-10 MED ORDER — PROPOFOL 500 MG/50ML IV EMUL
INTRAVENOUS | Status: DC | PRN
  Administered 2024-05-10: 30 ug/kg/min via INTRAVENOUS
  Administered 2024-05-10: 150 ug/kg/min via INTRAVENOUS

## 2024-05-10 MED ORDER — LIDOCAINE 2% (20 MG/ML) 5 ML SYRINGE
INTRAMUSCULAR | Status: AC
  Filled 2024-05-10: qty 5

## 2024-05-10 MED ORDER — MIDAZOLAM HCL 2 MG/2ML IJ SOLN: 0.5000 mg | Freq: Once | INTRAMUSCULAR | Status: DC | PRN

## 2024-05-10 MED ORDER — CETIRIZINE HCL 10 MG PO TABS
10.0000 mg | ORAL_TABLET | Freq: Every day | ORAL | 0 refills | Status: DC
  Filled 2024-05-10: qty 100, 100d supply, fill #0

## 2024-05-10 MED ORDER — LIDOCAINE-EPINEPHRINE 1 %-1:100000 IJ SOLN
INTRAMUSCULAR | Status: DC | PRN
  Administered 2024-05-10: 10 mL

## 2024-05-10 MED ORDER — FENTANYL CITRATE (PF) 100 MCG/2ML IJ SOLN: 25.0000 ug | INTRAMUSCULAR | Status: DC | PRN

## 2024-05-10 MED ORDER — INSULIN ASPART 100 UNIT/ML IJ SOLN: 0.0000 [IU] | INTRAMUSCULAR | Status: DC | PRN

## 2024-05-10 MED ORDER — OXYMETAZOLINE HCL 0.05 % NA SOLN
NASAL | Status: AC
  Filled 2024-05-10: qty 30

## 2024-05-10 MED ORDER — LIDOCAINE-EPINEPHRINE 1 %-1:100000 IJ SOLN
INTRAMUSCULAR | Status: AC
  Filled 2024-05-10: qty 1

## 2024-05-10 MED ORDER — OXYCODONE HCL 5 MG PO TABS: 5.0000 mg | ORAL_TABLET | Freq: Once | ORAL | Status: DC | PRN

## 2024-05-10 MED ORDER — LACTATED RINGERS IV SOLN: INTRAVENOUS | Status: DC

## 2024-05-10 MED ORDER — ORAL CARE MOUTH RINSE: 15.0000 mL | Freq: Once | OROMUCOSAL | Status: AC

## 2024-05-10 MED ORDER — PROPOFOL 1000 MG/100ML IV EMUL
INTRAVENOUS | Status: AC
  Filled 2024-05-10: qty 100

## 2024-05-10 MED ORDER — MEPERIDINE HCL 25 MG/ML IJ SOLN: 6.2500 mg | INTRAMUSCULAR | Status: DC | PRN

## 2024-05-10 MED ORDER — CHLORHEXIDINE GLUCONATE 0.12 % MT SOLN
15.0000 mL | Freq: Once | OROMUCOSAL | Status: AC
  Administered 2024-05-10: 15 mL via OROMUCOSAL
  Filled 2024-05-10: qty 15

## 2024-05-10 MED ORDER — PROPOFOL 10 MG/ML IV BOLUS
INTRAVENOUS | Status: AC
  Filled 2024-05-10: qty 20

## 2024-05-10 SURGICAL SUPPLY — 2 items
BRONCHOSCOPE PED SLIM DISP (MISCELLANEOUS) IMPLANT
SOL ANTI FOG 6CC (MISCELLANEOUS) ×2 IMPLANT

## 2024-05-10 NOTE — Discharge Instructions (Signed)
 DRUG-INDUCED SLEEP ENDOSCOPY POST-OPERATIVE INSTRUCTIONS:  Based on the drug-induced sleep endoscopy today, you were deemed a candidate for Inspire Therapy.  Please review post-operative and recovery instructions below that you will need to be aware of after Inspire Implant surgery.  Please restart all of your home medications if you take anything on a daily basis.  You can resume regular diet after this procedure.  You will be scheduled for pre-operative appointment with Dr. Irene Pap to review details about surgery and to discuss the next steps.   DIET: Resume normal diet HYGIENE: Please wait until 48 hours after surgery before getting incisions on neck, chest, and torso wet. In the first 48 hours after surgery, will likely need to take sponge baths. WOUND CARE: Please leave pressure dressing on for 48 hours after surgery. Gently place antibiotic ointment over incisions 2 times per day; use clean q-tip. May place a clean bandage over incisions as needed. After 48 hours, you may get incisions wet with warm soap and water, but do not soak the incisions.  Pat area dry gently.  Immediately place antibiotic ointment. Take oral antibiotics as prescribed If skin around incision starts to get red (> 1cm), swollen, and/or more painful, please call the office ACTIVITY: Try to avoid sleeping on the side of your surgery, to the extent possible.   You may walk for exercise starting the day after surgery. For 2 weeks: Do not pick up anything greater than 5 pounds with the hand/arm that's on the same side as the surgery.  After 2 weeks, you may increase weight to 10 pounds.   Consider performing neck rolls 10 clockwise and 10 counterclockwise 3x/day. For 4 weeks, no strenuous activity (running, jogging, lifting weights, gardening, sports) or until cleared by physician.   PAIN MEDICATIONS: You will be prescribed xxx for pain.   If pain is not severe, consider taking Tylenol 650mg  every 6 hours Avoid  aspirin for 7 days after surgery Avoid direct heat (such as heating pads) to incision sites.   May gently place ice over surgery sites as needed.  Please place a thin clean towel over skin first and then place ice bag over towel.  Ice for 10 minutes at a time only.  POST-OPERATIVE CLINIC APPOINTMENTS: 1 week: suture removal and wound check in the office.  1 month: device activation and wound check in the office. 2.5 months: check in visit to assess usage. 3-4 months: titration sleep study based on usage of >4 hrs/night.  4 months: final wound check in the office.  Yearly: device check at office.  SCAR CARE: After incisions have healed, you will have a scar, which will continue to evolve over the course of 12 months.  Caring for your incision scars will help them to be as minimal as possible. If you are out in the sun with incision exposure, please remember to place sunscreen over the incision and surrounding skin.   You may use vitamin E or "Scar ointment/cream" to help soften scar.  Please wait one month after surgery before starting this.

## 2024-05-10 NOTE — Op Note (Signed)
 Operative note  Preoperative diagnosis: OSA Postoperative diagnosis:   Same  Procedure:DISE ( Drug induced sleep endoscopy)  Anesthesia: Topical lidocaine  gel Complications: None Condition is stable throughout exam  Indications and consent:   The patient presents to my otolaryngology clinic with a chief complaint of OSA  Because of pt's OSA and desire to be a candidate for Inspire therapy, it was recommended that they undergo a flexible fiberoptic laryngoscopy under sedation (DISE).   All the risks, benefits, and potential complications were reviewed with the patient preoperatively and informed consent was obtained.  Procedure: Pt was brought back to the OR and laid supine on the table. Propofol  anesthesia was administered per protocol until pt reached optimal level of sedation. DISE exam showed the following anatomic collapse pattern.  VOTE classification Velopharynx- 2 A-P  Oropharynx- 1 Tongue base- 2 Epiglottis- 2  There was no evidence of complete concentric collapse at the level of the soft palate.  Based on the DISE findings, pt is a candidate for Hypoglossal nerve stimulation (Inspire therapy) based on the above anatomy.

## 2024-05-10 NOTE — Anesthesia Postprocedure Evaluation (Signed)
 Anesthesia Post Note  Patient: [name] [name]  Procedure(s) Performed: DRUG INDUCED SLEEP ENDOSCOPY (Nose)     Patient location during evaluation: PACU Anesthesia Type: MAC Level of consciousness: awake and alert, patient cooperative and oriented Pain management: pain level controlled Vital Signs Assessment: post-procedure vital signs reviewed and stable Respiratory status: spontaneous breathing, nonlabored ventilation and respiratory function stable Cardiovascular status: stable and blood pressure returned to baseline Postop Assessment: no apparent nausea or vomiting and able to ambulate Anesthetic complications: no   No notable events documented.  Last Vitals:  Vitals:   05/10/24 1015 05/10/24 1023  BP: (!) 158/98 (!) 160/101  Pulse: 88 86  Resp: 20 15  Temp:  36.7 C  SpO2: 93% 93%    Last Pain:  Vitals:   05/10/24 1023  TempSrc:   PainSc: 0-No pain                 Oneta Sigman,E. Toneisha Savary

## 2024-05-10 NOTE — Transfer of Care (Signed)
 Immediate Anesthesia Transfer of Care Note  Patient: [name] [name]  Procedure(s) Performed: DRUG INDUCED SLEEP ENDOSCOPY  Patient Location: PACU  Anesthesia Type:MAC  Level of Consciousness: awake, alert , and oriented  Airway & Oxygen Therapy: Patient Spontanous Breathing  Post-op Assessment: Report given to RN, Post -op Vital signs reviewed and stable, and Patient moving all extremities  Post vital signs: Reviewed and stable  Last Vitals:  Vitals Value Taken Time  BP 147/89 05/10/24 1009  Temp 36.7 C 05/10/24 1009  Pulse 86 05/10/24 1014  Resp 20 05/10/24 1014  SpO2 94 % 05/10/24 1014  Vitals shown include unfiled device data.  Last Pain:  Vitals:   05/10/24 1009  TempSrc:   PainSc: 0-No pain      Patients Stated Pain Goal: 0 (05/10/24 0801)  Complications: No notable events documented.

## 2024-05-10 NOTE — H&P (Signed)
 [name] [name] is an 63 y.o. male.    Chief Complaint:  OSA CPAP intolerance   HPI: Patient presents today for planned elective procedure.  He/she denies any interval change in history since office visit on 03/30/24. Here for DISE to determine candidacy for [facility] Implant.   Past Medical History:  Diagnosis Date   Anxiety    Coronary artery disease    Depression    Diabetes mellitus    Type 2   Dyspnea    Enlarged prostate    Environmental allergies    Environmental and seasonal allergies    GERD (gastroesophageal reflux disease)    Headache(784.0)    sinus and migraines   Hyperlipidemia    Hypertension    IBS (irritable bowel syndrome)    Low testosterone     Myocardial infarction (HCC)    Pneumonia    S/P angioplasty with stent to distal LCX and mid LCX both DES 07/25/23 07/26/2023   Sleep apnea    has used BiPAP in past; last sleep study >10 years    Past Surgical History:  Procedure Laterality Date   ATRIAL FIBRILLATION ABLATION N/A 02/01/2024   Procedure: ATRIAL FIBRILLATION ABLATION;  Surgeon: Ardeen Kohler, MD;  Location: [facility] INVASIVE CV LAB;  Service: Cardiovascular;  Laterality: N/A;   COLONOSCOPY  09/2018   normal, repeat in 10 years per patient.  [place], [place]   CORONARY STENT INTERVENTION N/A 11/10/2023   Procedure: CORONARY STENT INTERVENTION;  Surgeon: Arnoldo Lapping, MD;  Location: [facility] INVASIVE CV LAB;  Service: Cardiovascular;  Laterality: N/A;   CORONARY ULTRASOUND/IVUS N/A 11/10/2023   Procedure: Coronary Ultrasound/IVUS;  Surgeon: Arnoldo Lapping, MD;  Location: [facility] INVASIVE CV LAB;  Service: Cardiovascular;  Laterality: N/A;   CORONARY/GRAFT ACUTE MI REVASCULARIZATION N/A 07/25/2023   Procedure: Coronary/Graft Acute MI Revascularization;  Surgeon: Arnoldo Lapping, MD;  Location: [facility] INVASIVE CV LAB;  Service: Cardiovascular;  Laterality: N/A;   KNEE ARTHROSCOPY Left    LEFT HEART CATH AND CORONARY ANGIOGRAPHY N/A 07/25/2023   Procedure: LEFT HEART CATH AND  CORONARY ANGIOGRAPHY;  Surgeon: Arnoldo Lapping, MD;  Location: [facility] INVASIVE CV LAB;  Service: Cardiovascular;  Laterality: N/A;   RENAL ANGIOGRAPHY N/A 07/16/2020   Procedure: RENAL ANGIOGRAPHY;  Surgeon: Knox Perl, MD;  Location: [facility] INVASIVE CV LAB;  Service: Cardiovascular;  Laterality: N/A;   SACROILIAC JOINT FUSION Right 05/02/2014   Procedure: SACROILIAC JOINT FUSION;  Surgeon: Estevan Helper, MD;  Location: [facility] OR;  Service: Orthopedics;  Laterality: Right;  Right sided sacroiliac joint fusion   TONSILLECTOMY     uvula removed    Family History  Problem Relation Age of Onset   Cancer Mother        lung   Cancer Father        prostate, mets to bone, lung   Cancer Brother        melanoma   Heart disease Maternal Grandmother        MI   Lung disease Maternal Grandfather        black lung   Heart disease Paternal Grandmother    Stroke Paternal Grandmother    Heart disease Paternal Grandfather    Heart disease Brother 109       MI   Diabetes Brother     Social History:  reports that he quit smoking about 32 years ago. His smoking use included cigarettes. He started smoking about 42 years ago. He has a 10 pack-year smoking history. He has never used smokeless tobacco. He  reports that he does not drink alcohol and does not use drugs.  Allergies:  Allergies  Allergen Reactions   Pravastatin Other (See Comments)    Joint myalgias   Trazodone And Nefazodone     Had restless legs   Ezetimibe  Other (See Comments)    Myalgias, joint pain   Hydrochlorothiazide  Other (See Comments)    lightheadedness   Latex Rash   Metformin  And Related Diarrhea    Medications Prior to Admission  Medication Sig Dispense Refill   acetaminophen  (TYLENOL ) 500 MG tablet Take 2 tablets (1,000 mg total) by mouth every 6 (six) hours as needed for moderate pain (pain score 4-6). 30 tablet 2   apixaban  (ELIQUIS ) 5 MG TABS tablet Take 1 tablet (5 mg total) by mouth 2 (two) times daily. 180 tablet 3    atorvastatin  (LIPITOR) 40 MG tablet Take 1 tablet (40 mg total) by mouth daily. 90 tablet 3   carvedilol  (COREG ) 25 MG tablet Take 1 tablet (25 mg total) by mouth 2 (two) times daily. 180 tablet 1   cetirizine  (ZYRTEC ) 10 MG tablet Take 1 tablet (10 mg total) by mouth daily. 30 tablet 11   clopidogrel  (PLAVIX ) 75 MG tablet Take 1 tablet (75 mg total) by mouth daily. 90 tablet 3   Continuous Glucose Sensor (DEXCOM G7 SENSOR) MISC Change sensor every 10 days 9 each 3   empagliflozin  (JARDIANCE ) 25 MG TABS tablet Take 1 tablet (25 mg total) by mouth daily before breakfast. 90 tablet 3   fluticasone  (FLONASE ) 50 MCG/ACT nasal spray Place 2 sprays into both nostrils daily. 16 g 6   insulin  glargine (LANTUS  SOLOSTAR) 100 UNIT/ML Solostar Pen Inject 50 Units into the skin daily. (Patient taking differently: Inject 50 Units into the skin at bedtime.) 60 mL 3   insulin  lispro (HUMALOG  KWIKPEN) 200 UNIT/ML KwikPen Max daily 120 units 60 mL 3   nitroGLYCERIN  (NITROSTAT ) 0.4 MG SL tablet Place 1 tablet (0.4 mg total) under the tongue every 5 (five) minutes as needed for chest pain. 25 tablet 4   pantoprazole  (PROTONIX ) 40 MG tablet Take 1 tablet (40 mg total) by mouth daily. 90 tablet 0   REPATHA  SURECLICK 140 MG/ML SOAJ ADMINISTER 1 ML UNDER THE SKIN EVERY 14 DAYS. 6 mL 3   rOPINIRole  (REQUIP ) 2 MG tablet Take 2 mg by mouth at bedtime.     Semaglutide , 2 MG/DOSE, 8 MG/3ML SOPN Inject 2 mg as directed once a week. (Patient taking differently: Inject 2 mg as directed every Saturday.) 9 mL 3   vortioxetine  HBr (TRINTELLIX ) 20 MG TABS tablet Take 1 tablet (20 mg total) by mouth daily. 30 tablet 0   budesonide -formoterol  (SYMBICORT ) 160-4.5 MCG/ACT inhaler Inhale 2 puffs into the lungs 2 (two) times daily. (Patient not taking: Reported on 05/03/2024) 10.2 g 2   Insulin  Pen Needle (UNIFINE PENTIPS) 32G X 4 MM MISC 1 Device by Other route in the morning, at noon, in the evening, and at bedtime. 400 each 3   Insulin   Pen Needle 32G X 4 MM MISC 1 Device by Does not apply route in the morning, at noon, in the evening, and at bedtime. 400 each 3   losartan  (COZAAR ) 25 MG tablet Take 1 tablet (25 mg total) by mouth at bedtime. 90 tablet 3   triamcinolone  cream (KENALOG ) 0.1 % Apply 1 Application topically 2 (two) times daily. (Patient taking differently: Apply 1 Application topically 2 (two) times daily as needed (rash).) 30 g 0  Results for orders placed or performed during the hospital encounter of 05/10/24 (from the past 48 hours)  Glucose, capillary     Status: Abnormal   Collection Time: 05/10/24  7:53 AM  Result Value Ref Range   Glucose-Capillary 138 (H) 70 - 99 mg/dL    Comment: Glucose reference range applies only to samples taken after fasting for at least 8 hours.   No results found.  ROS: Review of Systems  Constitutional:  Negative for chills and fever.  Respiratory:  Negative for cough and stridor.   Cardiovascular:  Negative for chest pain.  Gastrointestinal:  Negative for heartburn and nausea.  Skin:  Negative for rash.  Neurological:  Negative for headaches.  Psychiatric/Behavioral:  The patient does not have insomnia.     Blood pressure (!) 148/90, pulse 88, temperature 98.1 F (36.7 C), temperature source Oral, resp. rate 20, height 5\' 10"  (1.778 m), weight 88.5 kg, SpO2 96%.  PHYSICAL EXAM: Exam: General: Well-developed, well-nourished Respiratory Respiratory effort: Equal inspiration and expiration without stridor Cardiovascular Peripheral Vascular: Warm extremities with equal color/perfusion Eyes: No nystagmus with equal extraocular motion bilaterally Neuro/Psych/Balance: Patient oriented to person, place, and time; Appropriate mood and affect; Gait is intact with no imbalance; Cranial nerves I-XII are intact Head and Face Inspection: Normocephalic and atraumatic without mass or lesion Facial Strength: Facial motility symmetric and full bilaterally ENT Pinna: External  ear intact and fully developed External Nose: No scar or anatomic deformity Lips, Teeth, and gums: Mucosa and teeth intact and viable Oral cavity/oropharynx: No erythema or exudate, no lesions present Neck Neck and Trachea: Midline trachea without mass or lesion Thyroid : No mass or nodularity Lymphatics: No lymphadenopathy   Assessment/Plan OSA CPAP intolerance   - proceed with DISE exam to determine candidacy for Inspire Implant.     Leemon Ayala 05/10/2024, 8:34 AM

## 2024-05-11 ENCOUNTER — Encounter (HOSPITAL_COMMUNITY): Payer: Self-pay | Admitting: Otolaryngology

## 2024-05-11 ENCOUNTER — Other Ambulatory Visit (HOSPITAL_COMMUNITY): Payer: Self-pay

## 2024-05-15 ENCOUNTER — Ambulatory Visit (INDEPENDENT_AMBULATORY_CARE_PROVIDER_SITE_OTHER): Admitting: Otolaryngology

## 2024-05-19 ENCOUNTER — Telehealth: Payer: Self-pay

## 2024-05-19 NOTE — Telephone Encounter (Signed)
 [name]: Distal pectinate with fairly circular ostium. Max 22.6/ AVG 20/ Depth 16 Likely use a 24mm device Inf/Mid TSP RAO 15 CAU 20

## 2024-05-25 ENCOUNTER — Ambulatory Visit: Payer: Medicaid Other | Admitting: Internal Medicine

## 2024-05-25 NOTE — Progress Notes (Signed)
 " Electrophysiology Office Note:   Date:  05/26/2024  ID:  [name] [name], DOB February 27, 1961, MRN [id number]  Primary Cardiologist: Ozell Fell, MD Electrophysiologist: Fonda Kitty, MD      History of Present Illness:   [name] [name] is a 63 y.o. male with h/o CAD s/p inferior lateral STEMI 06/2023 with PCI to circumflex and pLAD PCI 11/10/23, paroxysmal atrial fibrillation s/p ablation 02/01/24, hypertension, hyperlipidemia, diabetes, OSA  who is being seen today for evaluation for Watchman device implant at the request of Jodie Passey, [place].  Discussed the use of AI scribe software for clinical note transcription with the patient, who gave verbal consent to proceed.  History of Present Illness He has experienced severe episodes of atrial fibrillation characterized by sensations of 'fluttering' and 'skipping', accompanied by lightheadedness and near syncope on two occasions. He does not use a Kardia device but relies on a blood pressure cuff, which shows an error code during these episodes. His symptoms have improved over the past two to three weeks.  He is currently taking carvedilol , 25 mg twice daily, which he has doubled due to elevated blood pressure following a bout of pneumonia. Additionally, he has increased his losartan  dose to 75 mg at night, but his blood pressure remains elevated at 181/104 mmHg, compared to previous readings in the 120s/70s before his illness.  He is on Eliquis  for stroke prevention due to atrial fibrillation and has experienced prolonged bleeding from cuts and following injection of his subcutaneous insulin , which he attributes to the anticoagulant. He also takes Plavix  for stents placed in July and November of the previous year.    Review of systems complete and found to be negative unless listed in HPI.   EP Information / Studies Reviewed:    EKG is not ordered today. EKG from 05/02/24 reviewed which showed sinus rhythm      Echo 07/26/23:  Low normal LV  systolic function.  LVEF 50 to 55%.  Mild concentric LVH.  Grade 1 diastolic dysfunction. Normal RV size and function. No significant valvular disease. Left and right atrium are normal in size.   Zio Monitor 08/2023 Worn for only 3 days. 5 total AF episodes.   Risk Assessment/Calculations:    CHA2DS2-VASc Score = 3   This indicates a 3.2% annual risk of stroke. The patient's score is based upon: CHF History: 0 HTN History: 1 Diabetes History: 1 Stroke History: 0 Vascular Disease History: 1 Age Score: 0 Gender Score: 0     Physical Exam:   VS:  BP (!) 164/86   Pulse 87   Ht 5' 10 (1.778 m)   Wt 182 lb (82.6 kg)   SpO2 98%   BMI 26.11 kg/m    Wt Readings from Last 3 Encounters:  05/26/24 182 lb (82.6 kg)  05/10/24 195 lb (88.5 kg)  05/02/24 204 lb 9.6 oz (92.8 kg)     GEN: Well nourished, well developed in no acute distress NECK: No JVD CARDIAC: Normal rate, regular rhythm RESPIRATORY:  Clear to auscultation without rales, wheezing or rhonchi  ABDOMEN: Soft, non-distended EXTREMITIES:  No edema; No deformity   ASSESSMENT AND PLAN:    I have seen [name] [name] in the office today who is being considered for a Watchman left atrial appendage closure device. I believe they will benefit from this procedure given their history of atrial fibrillation, CHA2DS2-VASc score of 3 and unadjusted ischemic stroke rate of 3.2% per year. Unfortunately, the patient is not felt to be a long term  anticoagulation candidate secondary to bleeding. The patient's chart has been reviewed and I feel that they would be a candidate for short term oral anticoagulation after Watchman implant.   It is my belief that after undergoing a LAA closure procedure, [name] [name] will not need long term anticoagulation which eliminates anticoagulation side effects and major bleeding risk.   Procedural risks for the Watchman implant have been reviewed with the patient including a 0.5% risk of stroke, <1%  risk of perforation and <1% risk of device embolization. Other risks include bleeding, vascular damage, tamponade, worsening renal function, and death. The patient understands these risk and wishes to proceed.     The published clinical data on the safety and effectiveness of WATCHMAN include but are not limited to the following: - Holmes DR, Jess BEARD, Sick P et al. for the PROTECT AF Investigators. Percutaneous closure of the left atrial appendage versus warfarin therapy for prevention of stroke in patients with atrial fibrillation: a randomised non-inferiority trial. Lancet 2009; 374: 534-42. GLENWOOD Jess BEARD, Doshi SK, Jonita VEAR Satchel D et al. on behalf of the PROTECT AF Investigators. Percutaneous Left Atrial Appendage Closure for Stroke Prophylaxis in Patients With Atrial Fibrillation 2.3-Year Follow-up of the PROTECT AF (Watchman Left Atrial Appendage System for Embolic Protection in Patients With Atrial Fibrillation) Trial. Circulation 2013; 127:720-729. - Alli O, Doshi S,  Kar S, Reddy VY, Sievert H et al. Quality of Life Assessment in the Randomized PROTECT AF (Percutaneous Closure of the Left Atrial Appendage Versus Warfarin Therapy for Prevention of Stroke in Patients With Atrial Fibrillation) Trial of Patients at Risk for Stroke With Nonvalvular Atrial Fibrillation. J Am Coll Cardiol 2013; 61:1790-8. GLENWOOD Satchel DR, Archer RAMAN, Price M, Whisenant B, Sievert H, Doshi S, Huber K, Reddy V. Prospective randomized evaluation of the Watchman left atrial appendage Device in patients with atrial fibrillation versus long-term warfarin therapy; the PREVAIL trial. Journal of the [employer] of Cardiology, Vol. 4, No. 1, 2014, 1-11. - Kar S, Doshi SK, Sadhu A, Horton R, Osorio J et al. Primary outcome evaluation of a next-generation left atrial appendage closure device: results from the PINNACLE FLX trial. Circulation 2021;143(18)1754-1762.    HAS-BLED score 2 Hypertension Yes  Abnormal renal and liver  function (Dialysis, transplant, Cr >2.26 mg/dL /Cirrhosis or Bilirubin >2x Normal or AST/ALT/AP >3x Normal) No  Stroke No  Bleeding No  Labile INR (Unstable/high INR) No  Elderly (>65) No  Drugs or alcohol (>= 8 drinks/week, anti-plt or NSAID) Yes   CHA2DS2-VASc Score = 3  The patient's score is based upon: CHF History: 0 HTN History: 1 Diabetes History: 1 Stroke History: 0 Vascular Disease History: 1 Age Score: 0 Gender Score: 0       ASSESSMENT AND PLAN: #. Paroxysmal atrial fibrillation: S/p ablation 02/01/24 #. Secondary hypercoagulable state due to atrial fibrillation: -Continue Eliquis  5mg  BID for now. Watchman as above.  -Carvedilol  25mg  BID.    #. CAD: Inferior lateral STEMI 06/2023 with PCI to circumflex and pLAD PCI 11/10/23 -No chest pain currently. -Continue Plavix , statin, beta-blocker.   #. Hypertension: Recently poor controlled despite increasing carvedilol  to 50mg  BID and losartan  to 75mg  BID. - Increase losartan  to 100mg  daily, decrease carvedilol  to 25mg  BID. Recommend checking blood pressures 1-2 times per week at home and recording the values.  Recommend bringing these recordings to the primary care physician.   #. OSA: -Sleep study.  Follow up with Dr. Kennyth 3 months after [facility] implant.    Signed, Fonda  Kennyth, MD  "

## 2024-05-26 ENCOUNTER — Encounter: Payer: Self-pay | Admitting: Cardiology

## 2024-05-26 ENCOUNTER — Ambulatory Visit: Attending: Cardiology | Admitting: Cardiology

## 2024-05-26 VITALS — BP 164/86 | HR 87 | Ht 70.0 in | Wt 182.0 lb

## 2024-05-26 DIAGNOSIS — I10 Essential (primary) hypertension: Secondary | ICD-10-CM | POA: Insufficient documentation

## 2024-05-26 DIAGNOSIS — G4733 Obstructive sleep apnea (adult) (pediatric): Secondary | ICD-10-CM | POA: Insufficient documentation

## 2024-05-26 DIAGNOSIS — I480 Paroxysmal atrial fibrillation: Secondary | ICD-10-CM | POA: Diagnosis present

## 2024-05-26 DIAGNOSIS — D6869 Other thrombophilia: Secondary | ICD-10-CM | POA: Diagnosis present

## 2024-05-26 DIAGNOSIS — I2510 Atherosclerotic heart disease of native coronary artery without angina pectoris: Secondary | ICD-10-CM | POA: Insufficient documentation

## 2024-05-26 MED ORDER — LOSARTAN POTASSIUM 100 MG PO TABS: 100.0000 mg | ORAL_TABLET | Freq: Every day | ORAL | 3 refills | Status: AC

## 2024-05-26 NOTE — Patient Instructions (Addendum)
 Medication Instructions:  Your physician has recommended you make the following change in your medication:  INCREASE Losartan  to 100 mg daily  *If you need a refill on your cardiac medications before your next appointment, please call your pharmacy*  Lab Work: None ordered  If you have any lab test that is abnormal or we need to change your treatment, we will call you to review the results.  Testing/Procedures: None ordered  Follow-Up: At [facility], you and your health needs are our priority.  As part of our continuing mission to provide you with exceptional heart care, our providers are all part of one team.  This team includes your primary Cardiologist (physician) and Advanced Practice Providers or APPs (Physician Assistants and Nurse Practitioners) who all work together to provide you with the care you need, when you need it.  Your next appointment:   You will be contacted by Nurse Navigator, Larkin Plumb to schedule your pre-procedure visit and procedure date. If you have any questions she can be reached at [phone].      Thank you for choosing [facility]!!   [phone]

## 2024-05-29 ENCOUNTER — Telehealth: Payer: Self-pay

## 2024-05-29 NOTE — Telephone Encounter (Signed)
 Called to discuss Watchman timing.  Left message to call back.

## 2024-05-29 NOTE — Telephone Encounter (Signed)
 The patient will wait until after his INSPIRE implant for LAAO. Will arrange LAAO September 18 and will call the patient after his INSPIRE to arrange labs (and pre-procedure visit if anything has changed).  He was grateful for call and agreed with plan.

## 2024-06-01 ENCOUNTER — Telehealth: Payer: Self-pay

## 2024-06-01 ENCOUNTER — Other Ambulatory Visit (HOSPITAL_COMMUNITY): Payer: Self-pay | Admitting: Internal Medicine

## 2024-06-05 ENCOUNTER — Ambulatory Visit
Admission: RE | Admit: 2024-06-05 | Discharge: 2024-06-05 | Disposition: A | Discharge status: Home / Self Care | Source: Ambulatory Visit | Attending: Emergency Medicine | Admitting: Emergency Medicine

## 2024-06-05 DIAGNOSIS — J189 Pneumonia, unspecified organism: Secondary | ICD-10-CM

## 2024-06-05 NOTE — Telephone Encounter (Signed)
 [name] [name]

## 2024-06-08 ENCOUNTER — Ambulatory Visit: Admitting: Emergency Medicine

## 2024-06-08 ENCOUNTER — Encounter: Payer: Self-pay | Admitting: Emergency Medicine

## 2024-06-08 VITALS — BP 114/82 | HR 82 | Ht 70.0 in | Wt 201.6 lb

## 2024-06-08 DIAGNOSIS — R9389 Abnormal findings on diagnostic imaging of other specified body structures: Secondary | ICD-10-CM

## 2024-06-08 DIAGNOSIS — R911 Solitary pulmonary nodule: Secondary | ICD-10-CM

## 2024-06-08 DIAGNOSIS — J3089 Other allergic rhinitis: Secondary | ICD-10-CM | POA: Diagnosis not present

## 2024-06-08 DIAGNOSIS — K219 Gastro-esophageal reflux disease without esophagitis: Secondary | ICD-10-CM

## 2024-06-08 MED ORDER — IPRATROPIUM BROMIDE 0.03 % NA SOLN: 2.0000 | Freq: Three times a day (TID) | NASAL | 3 refills | Status: DC | PRN

## 2024-06-08 NOTE — Assessment & Plan Note (Signed)
 Stop fluticasone  nasal spray Please start ipratropium nasal spray, 2 sprays each nostril up to 3 times daily as needed for nasal congestion and drainage

## 2024-06-08 NOTE — Assessment & Plan Note (Signed)
 6 mm nodule right middle lobe seen on his most recent CT chest.  Plan for surveillance given minimal tobacco history, family history of lung cancer.  Next scan in 1 year

## 2024-06-08 NOTE — Assessment & Plan Note (Signed)
 We reviewed your CT scan of the chest today.  The inflammatory changes seen in April have almost completely resolved.  This is good news. Follow Dr. Baldwin Levee in June 2026 so we can review your CT scan.  Call sooner if you have any problems.

## 2024-06-08 NOTE — Progress Notes (Signed)
 Subjective:    Patient ID: [name] [name], male    DOB: Nov 16, 1961, 63 y.o.   MRN: [id number]  HPI  ROV 06/08/2024 --follow-up visit for 63 year old gentleman with a history of tobacco use whom I saw in April for shortness of breath, cough and an abnormal CT scan of the chest.  He has a history of A-fib/ablation, diabetes, hypertension, hyperlipidemia, depression, OSA not on CPAP, hospitalization for possible pneumonia/pneumonitis with imaging that showed subpleural groundglass opacities.  We decided to repeat his CT chest to evaluate for resolution.  He also had pulmonary function testing to evaluate his shortness of breath as below. Pulmonary function testing performed 05/04/2024 reviewed by me shows normal airflows without a bronchodilator response, lung volumes suggestive of possible restriction based on a decreased RV, normal diffusion capacity.  Flow volume loop normal in appearance  His breathing is back to baseline.   CT scan of the chest 06/05/2024 reviewed by me, shows resolution of his peripheral groundglass and interstitial opacities compared with his prior scan from 04/05/2024.  A 1.4 cm nodular consolidation in the right middle lobe is now a linear scar.  There is a stable round 6 mm right middle lobe nodule (not seen 12/2023)     Review of Systems As per HPI  Past Medical History:  Diagnosis Date   Anxiety    Coronary artery disease    Depression    Diabetes mellitus    Type 2   Dyspnea    Enlarged prostate    Environmental allergies    Environmental and seasonal allergies    GERD (gastroesophageal reflux disease)    Headache(784.0)    sinus and migraines   Hyperlipidemia    Hypertension    IBS (irritable bowel syndrome)    Low testosterone     Myocardial infarction (HCC)    Pneumonia    S/P angioplasty with stent to distal LCX and mid LCX both DES 07/25/23 07/26/2023   Sleep apnea    has used BiPAP in past; last sleep study >10 years     Family History  Problem  Relation Age of Onset   Cancer Mother        lung   Cancer Father        prostate, mets to bone, lung   Cancer Brother        melanoma   Heart disease Maternal Grandmother        MI   Lung disease Maternal Grandfather        black lung   Heart disease Paternal Grandmother    Stroke Paternal Grandmother    Heart disease Paternal Grandfather    Heart disease Brother 38       MI   Diabetes Brother      Social History   Socioeconomic History   Marital status: Single    Spouse name: Not on file   Number of children: Not on file   Years of education: Not on file   Highest education level: Not on file  Occupational History   Not on file  Tobacco Use   Smoking status: Former    Current packs/day: 0.00    Average packs/day: 1 pack/day for 10.0 years (10.0 ttl pk-yrs)    Types: Cigarettes    Start date: 04/20/1982    Quit date: 04/20/1992    Years since quitting: 32.1   Smokeless tobacco: Never  Vaping Use   Vaping status: Never Used  Substance and Sexual Activity   Alcohol use: No  Drug use: No   Sexual activity: Not on file  Other Topics Concern   Not on file  Social History Narrative   Not on file   Social Drivers of Health   Financial Resource Strain: Not on file  Food Insecurity: No Food Insecurity (04/12/2024)   Hunger Vital Sign    Worried About Running Out of Food in the Last Year: Never true    Ran Out of Food in the Last Year: Never true  Transportation Needs: No Transportation Needs (04/12/2024)   PRAPARE - Administrator, Civil Service (Medical): No    Lack of Transportation (Non-Medical): No  Physical Activity: Unknown (03/20/2019)   Exercise Vital Sign    Days of Exercise per Week: 0 days    Minutes of Exercise per Session: Not on file  Stress: Not on file  Social Connections: Unknown (05/11/2022)   Received from [facility]   Social Network    Social Network: Not on file  Intimate Partner Violence: Not At Risk (04/12/2024)    Humiliation, Afraid, Rape, and Kick questionnaire    Fear of Current or Ex-Partner: No    Emotionally Abused: No    Physically Abused: No    Sexually Abused: No    No inhaled exposures 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   Allergies  Allergen Reactions   Pravastatin Other (See Comments)    Joint myalgias   Trazodone And Nefazodone     Had restless legs   Ezetimibe  Other (See Comments)    Myalgias, joint pain   Hydrochlorothiazide  Other (See Comments)    lightheadedness   Latex Rash   Metformin  And Related Diarrhea     Outpatient Medications Prior to Visit  Medication Sig Dispense Refill   acetaminophen  (TYLENOL ) 500 MG tablet Take 2 tablets (1,000 mg total) by mouth every 6 (six) hours as needed for moderate pain (pain score 4-6). 30 tablet 2   apixaban  (ELIQUIS ) 5 MG TABS tablet Take 1 tablet (5 mg total) by mouth 2 (two) times daily. 180 tablet 3   atorvastatin  (LIPITOR) 40 MG tablet Take 1 tablet (40 mg total) by mouth daily. 90 tablet 3   carvedilol  (COREG ) 25 MG tablet TAKE 1 TABLET(25 MG) BY MOUTH TWICE DAILY 180 tablet 3   cetirizine  (ZYRTEC ) 10 MG tablet Take 1 tablet (10 mg total) by mouth daily. 100 tablet 0   clopidogrel  (PLAVIX ) 75 MG tablet Take 1 tablet (75 mg total) by mouth daily. 90 tablet 3   Continuous Glucose Sensor (DEXCOM G7 SENSOR) MISC Change sensor every 10 days 9 each 3   empagliflozin  (JARDIANCE ) 25 MG TABS tablet Take 1 tablet (25 mg total) by mouth daily before breakfast. 90 tablet 3   fluticasone  (FLONASE ) 50 MCG/ACT nasal spray Place 2 sprays into both nostrils daily. 16 g 6   insulin  glargine (LANTUS  SOLOSTAR) 100 UNIT/ML Solostar Pen Inject 50 Units into the skin daily. (Patient taking differently: Inject 50 Units into the skin at bedtime.) 60 mL 3   insulin  lispro (HUMALOG  KWIKPEN) 200 UNIT/ML KwikPen Max daily 120 units 60 mL 3   Insulin  Pen Needle (UNIFINE PENTIPS) 32G X 4 MM MISC 1 Device by Other route in the morning, at noon, in the evening,  and at bedtime. 400 each 3   Insulin  Pen Needle 32G X 4 MM MISC 1 Device by Does not apply route in the morning, at noon, in the evening, and at bedtime. 400 each 3   losartan  (COZAAR ) 100  MG tablet Take 1 tablet (100 mg total) by mouth daily. 90 tablet 3   nitroGLYCERIN  (NITROSTAT ) 0.4 MG SL tablet Place 1 tablet (0.4 mg total) under the tongue every 5 (five) minutes as needed for chest pain. 25 tablet 4   pantoprazole  (PROTONIX ) 40 MG tablet Take 1 tablet (40 mg total) by mouth daily. 90 tablet 0   REPATHA  SURECLICK 140 MG/ML SOAJ ADMINISTER 1 ML UNDER THE SKIN EVERY 14 DAYS. 6 mL 3   rOPINIRole  (REQUIP ) 2 MG tablet Take 2 mg by mouth at bedtime.     Semaglutide , 2 MG/DOSE, 8 MG/3ML SOPN Inject 2 mg as directed once a week. (Patient taking differently: Inject 2 mg as directed every Saturday.) 9 mL 3   triamcinolone  cream (KENALOG ) 0.1 % Apply 1 Application topically 2 (two) times daily. (Patient taking differently: Apply 1 Application topically 2 (two) times daily as needed (rash).) 30 g 0   vortioxetine  HBr (TRINTELLIX ) 20 MG TABS tablet Take 1 tablet (20 mg total) by mouth daily. 30 tablet 0   budesonide -formoterol  (SYMBICORT ) 160-4.5 MCG/ACT inhaler Inhale 2 puffs into the lungs 2 (two) times daily. (Patient not taking: Reported on 06/08/2024) 10.2 g 2   No facility-administered medications prior to visit.        Objective:   Physical Exam  Vitals:   06/08/24 1254  BP: 114/82  Pulse: 82  SpO2: 94%  Weight: 201 lb 9.6 oz (91.4 kg)  Height: 5' 10 (1.778 m)   Gen: Pleasant, well-nourished, in no distress,  normal affect  ENT: No lesions,  mouth clear,  oropharynx clear, no postnasal drip  Neck: No JVD, no stridor  Lungs: No use of accessory muscles, no crackles or wheezing on normal respiration, no wheeze on forced expiration  Cardiovascular: RRR, heart sounds normal, no murmur or gallops, no peripheral edema  Musculoskeletal: No deformities, no cyanosis or clubbing  Neuro:  alert, awake, non focal  Skin: Warm, no lesions or rash        Assessment & Plan:  Abnormal CT of the chest We reviewed your CT scan of the chest today.  The inflammatory changes seen in April have almost completely resolved.  This is good news. Follow Dr. Baldwin Levee in June 2026 so we can review your CT scan.  Call sooner if you have any problems.  Pulmonary nodule 6 mm nodule right middle lobe seen on his most recent CT chest.  Plan for surveillance given minimal tobacco history, family history of lung cancer.  Next scan in 1 year  Other allergic rhinitis Stop fluticasone  nasal spray Please start ipratropium nasal spray, 2 sprays each nostril up to 3 times daily as needed for nasal congestion and drainage  GERD (gastroesophageal reflux disease) We will stop his pantoprazole  and see how he does.  If he has recurrent upper airway irritation then we can restart it.  He thinks that it was probably the Symbicort  that was contributing to his voice changes and throat irritation    Racheal Buddle, MD, PhD 06/08/2024, 1:29 PM [facility] [phone] or if no answer before 7:00PM call [phone] For any issues after 7:00PM please call eLink [phone]

## 2024-06-08 NOTE — Assessment & Plan Note (Signed)
 We will stop his pantoprazole  and see how he does.  If he has recurrent upper airway irritation then we can restart it.  He thinks that it was probably the Symbicort  that was contributing to his voice changes and throat irritation

## 2024-06-08 NOTE — Patient Instructions (Signed)
 We reviewed your CT scan of the chest today.  The inflammatory changes seen in April have almost completely resolved.  This is good news. We will plan to repeat a CT scan of your chest without contrast in June 2026 to follow a small pulmonary nodule for interval stability. We reviewed your pulmonary function testing today.  These were reassuring. Okay to stop your pantoprazole  Stop fluticasone  nasal spray Please start ipratropium nasal spray, 2 sprays each nostril up to 3 times daily as needed for nasal congestion and drainage Follow Dr. Baldwin Levee in June 2026 so we can review your CT scan.  Call sooner if you have any problems.

## 2024-06-14 ENCOUNTER — Telehealth: Payer: Self-pay | Admitting: Cardiovascular Disease

## 2024-06-14 NOTE — Telephone Encounter (Signed)
 Pt c/o BP issue: STAT if pt c/o blurred vision, one-sided weakness or slurred speech.  STAT if BP is GREATER than 180/120 TODAY.  STAT if BP is LESS than 90/60 and SYMPTOMATIC TODAY  1. What is your BP concern? BP elevated   2. Have you taken any BP medication today?Yes   3. What are your last 5 BP readings? This morning: 150/100 148/85 138/85 150/105  4. Are you having any other symptoms (ex. Dizziness, headache, blurred vision, passed out)? Headache

## 2024-06-14 NOTE — Telephone Encounter (Signed)
 Spoke with patient who is reporting he has been having elevated blood pressure since February or March.  He reports having had pneumonia which took 2 months to resolve but BP has not improved since recovering from the pneumonia.  In review of medication he reports he has been taking Losartan  125 mg daily.  He says he was prescribed 100 mg but still had the 25 mg tablets and has been taking both.  He also takes carvedilol  25 mg BID.  He denies starting any new medication or supplements.  Denies any new s/s but does take his BP regularly and it has been elevated for sometime now.  Advised to continue to monitor both HR and BP and continue current medications for now.  Pt is aware this will be sent to Dr Arlester Ladd for review and he will be contacted with further instructions.

## 2024-06-16 NOTE — Telephone Encounter (Signed)
  Pt is calling back and very upset that he has not receive a callback yet. He is very concern about his elevated BP and really would like to hear back from Dr. Arlester Ladd

## 2024-06-16 NOTE — Telephone Encounter (Signed)
 Spoke with patient and he states he feels like his BP is not a concern to us .  I did apologize for the delay. However his higher readings were before medications. Did advise to take his BP 2 hours after his BP medications. We can not tell if medication is working if he is only checking before medications.   Advised to continue to monitor BP and keep a log. Remember to check 2 hours after medications. Staying hydrated and monitoring salt intake. Ed precautions were discussed. Will forward to provider

## 2024-06-17 NOTE — Telephone Encounter (Signed)
 Reviewed his BP's and current meds. Recommend that he add amlodipine  5 mg daily. Continue losartan  and carvedilol  at current doses. Thanks.

## 2024-06-19 MED ORDER — AMLODIPINE BESYLATE 5 MG PO TABS: 5.0000 mg | ORAL_TABLET | Freq: Every day | ORAL | 3 refills | Status: DC

## 2024-06-19 NOTE — Telephone Encounter (Signed)
 Called and spoke with patient who agrees to plan. Amlodipine  sent to requested pharmacy and he will continue other medications. Will monitor BP and let us  know any concerns moving forward.

## 2024-07-12 ENCOUNTER — Other Ambulatory Visit: Payer: Self-pay

## 2024-07-12 DIAGNOSIS — I480 Paroxysmal atrial fibrillation: Secondary | ICD-10-CM

## 2024-07-14 ENCOUNTER — Encounter: Payer: Self-pay | Admitting: Advanced Practice Midwife

## 2024-07-18 ENCOUNTER — Telehealth: Payer: Self-pay

## 2024-07-18 MED ORDER — ASPIRIN 81 MG PO TBEC: 81.0000 mg | DELAYED_RELEASE_TABLET | Freq: Every day | ORAL | Status: DC

## 2024-07-18 NOTE — Telephone Encounter (Signed)
 Discussed patient with Dr. Wonda.  Wonda Sharper, MD  Josue Lamarr LABOR, RN Yeah I'd be fine to switch him to ASA 81 mg daily and stop plavix .   Instructed the patient to STOP PLAVIX  as he is >6 months from PCI.  He will START ASPIRIN  81 mg. Scheduled him for pre-Watchman visit 9/2. He was grateful for call and agreed with plan.

## 2024-07-24 ENCOUNTER — Other Ambulatory Visit (HOSPITAL_COMMUNITY): Payer: Self-pay

## 2024-07-24 ENCOUNTER — Other Ambulatory Visit: Payer: Self-pay

## 2024-07-25 ENCOUNTER — Telehealth (INDEPENDENT_AMBULATORY_CARE_PROVIDER_SITE_OTHER): Payer: Self-pay | Admitting: Otolaryngology

## 2024-07-25 ENCOUNTER — Other Ambulatory Visit (HOSPITAL_COMMUNITY): Payer: Self-pay

## 2024-07-25 NOTE — Telephone Encounter (Signed)
 Patient called in upset about not being notified about the new office location until the day before.

## 2024-07-26 ENCOUNTER — Encounter (INDEPENDENT_AMBULATORY_CARE_PROVIDER_SITE_OTHER): Payer: Self-pay | Admitting: Otolaryngology

## 2024-07-26 ENCOUNTER — Ambulatory Visit (INDEPENDENT_AMBULATORY_CARE_PROVIDER_SITE_OTHER): Admitting: Otolaryngology

## 2024-07-26 VITALS — BP 107/67 | HR 79

## 2024-07-26 DIAGNOSIS — Z91198 Patient's noncompliance with other medical treatment and regimen for other reason: Secondary | ICD-10-CM | POA: Diagnosis not present

## 2024-07-26 DIAGNOSIS — J3089 Other allergic rhinitis: Secondary | ICD-10-CM

## 2024-07-26 DIAGNOSIS — J342 Deviated nasal septum: Secondary | ICD-10-CM

## 2024-07-26 DIAGNOSIS — Z789 Other specified health status: Secondary | ICD-10-CM

## 2024-07-26 DIAGNOSIS — J3489 Other specified disorders of nose and nasal sinuses: Secondary | ICD-10-CM

## 2024-07-26 DIAGNOSIS — R0981 Nasal congestion: Secondary | ICD-10-CM

## 2024-07-26 DIAGNOSIS — J343 Hypertrophy of nasal turbinates: Secondary | ICD-10-CM

## 2024-07-26 DIAGNOSIS — G4733 Obstructive sleep apnea (adult) (pediatric): Secondary | ICD-10-CM | POA: Diagnosis not present

## 2024-07-26 DIAGNOSIS — R0982 Postnasal drip: Secondary | ICD-10-CM

## 2024-07-26 MED ORDER — FLUTICASONE PROPIONATE 50 MCG/ACT NA SUSP: 2.0000 | Freq: Every day | NASAL | 6 refills | Status: DC

## 2024-07-26 MED ORDER — LEVOCETIRIZINE DIHYDROCHLORIDE 5 MG PO TABS: 5.0000 mg | ORAL_TABLET | Freq: Every evening | ORAL | 3 refills | Status: DC

## 2024-07-26 NOTE — Progress Notes (Signed)
 ENT Progress Note:   Update 07/26/2024  Discussed the use of AI scribe software for clinical note transcription with the patient, who gave verbal consent to proceed.  History of Present Illness  Discussed the use of AI scribe software for clinical note transcription with the patient, who gave verbal consent to proceed.  History of Present Illness [name] [name] is a 63 year old male with obstructive sleep apnea who presents for pre-operative consultation for Inspire device implantation, surgery scheduled for 08/09/24. No new illnesses admissions or medications since last office visit on 03/30/24.  He has been unable to tolerate a CPAP mask for decades and is eager to improve his sleep quality without it. He experiences daytime fatigue and frequent yawning. He recalls a brief period when he could wear a CPAP mask and felt more awake after six hours of sleep, but this has not been the case for a long time.  He inquires about his deviated septum and nasal congestion, which he manages with a nasal spray. The spray is effective, but he sometimes struggles with its application.   His past allergy  tests as a child showed positive results for multiple allergens, but recent tests only indicated mold and some tree pollen.   Records Reviewed:  Initial Evaluation  Reason for Consult: OSA CPAP intolerance  and chronic nasal congestion and hx of deviated septum  HPI: Discussed the use of AI scribe software for clinical note transcription with the patient, who gave verbal consent to proceed.  History of Present Illness [name] [name] is a 63 year old male with hx of moderate sleep apnea who presents with difficulty tolerating CPAP therapy and chronic nasal congestion/nasal obstruction. He was referred by his his neurologist at [facility] for evaluation of sleep apnea and difficulty tolerating CPAP therapy.  He has a history of sleep apnea diagnosed in the early 2000s and experiences difficulty tolerating CPAP  therapy, primarily due to his sleeping position on his stomach, which affects the mask seal. He has tried various masks without success and is not currently using CPAP. He underwent tonsillectomy and uvulopalatopharyngoplasty, but this procedure did not resolve his sleep apnea.  He has a deviated septum and chronic nasal congestion, with both sides feeling clogged. He experiences constant sinus drainage and has been allergy  tested, but allergy  shots were ineffective (tried for 6 months without significant relief). He has tried Flonase  and Zyrtec , which provide some relief but do not stop the postnasal drainage. No frequent sinus infections, but he recently had a brief episode of acute sinusitis with epistaxis.  He has a significant cardiac history, including a heart attack, three stents, and ablation surgery for atrial fibrillation within the past year. He is concerned about the cardiovascular risks associated with untreated sleep apnea. He is currently on Eliquis  for atrial fibrillation. No history of insomnia.    BMI today 28.27  Records Reviewed:  [facility] Note from 03/07/24 ( home sleep study report) CLINICAL INFORMATION/HISTORY: patient with history of CAD/ angioplasty, atrial fib and DM on Insulin . [name] [name] is a 63 y.o. male patient who is seen upon  Dr Rebbeca new referral on 02/04/2024 for a Sleep Medicine consultation.    Chief concern according to patient :  I had a heart attack in July and that cause me to change my lifestyle, my diet.    I have the pleasure of seeing [name] [name] 02/04/24 a right -handed male with a possible sleep disorder.    The patient had the first sleep  study in the year 2020 at [place] [facility] - AHI 14.3/h and he had already been treated on  BiPAP-  all hypopneas- not apneas-  habitual mouth breather ,  an oxygen saturation Nadir at SP02 86%. Total hypoxia time 25 minutes . He has trouble to breathe when sleeping flat .  History of  mild OSA, hypopnea-  has a BIPAP but not used in 5 years. Excessive daytime  sleepiness, snoring,  mouth breathing -loud snoring Overbite .  status post UPPP>  nasal septal deviation, sinusitis -. Depression - morning headaches daily !       Sleep relevant medical history: UPPP/ Tonsillectomy, sinuitis,  congested, nasal congestion.  Nocturia/ Enuresis  1-2 , as investigated for  Parasomnia , had a Tonsillectomy,   repair/ UPPP? ENT cpnsult showed deviated septum. Chief complaint according to patient : I am soo sleepy, so very tired, HYPERSOMNIA . Sleep and medical history: [name] [name] reports having been tested at  [facility] and later at [facility] center (over 5 years ago ) - he remembers being diagnosed with 118 apneas per hour, and that he had his first sleep study taken in a seated position due to orthopnea. He started off on his CPAP could not tolerated was changed to a BiPAP and still could not tolerate it.  His positive airway pressure intolerance was closely linked to a condition of chronic sinusitis, sinus congestion and a known seasonal pattern. He underwent a tonsillectomy but his sinus headaches responded, his nasal airflow was still restricted.  At the allergy  and asthma center it was documented that he had a tonsillectomy and uvuloplasty, but he continues to have very poor quality sleep, that he is using fluticasone  nasal spray and that he tested positive for mold and certain tree allergens.  His obstructive sleep apnea syndrome has been untreated for many years now.  He also has sacroiliitis, radiculopathy which was not further specified degeneration of the lumbar or lumbosacral intervertebral discs were documented. DM2, anxiety.      I will  first order a HST and if positive again, we should try for in lab  titration . I will also refer to Dr Saldatova.   Epworth sleepiness score: 19/ 24 points   FSS endorsed at 63/ 63 points.  13/ 15 on GDS (!!)     BMI: 30.4 kg/m   Neck Circumference: 18    Sleep Summary:   Total Recording Time (hours, min): 8 hours 22 minutes      Total Sleep Time (hours, min):        7 hours 19 minutes         Percent REM (%):     9%                                   Respiratory Indices following AASM criteria:   Calculated pAHI (per hour):     19.2/h                                   IMPRESSION:  This HST confirms the presence of moderate and all obstructive sleep apnea which was not REM sleep dependent and exacerbated in supine sleep position.  There was no associated hypoxemia   RECOMMENDATION: Based on these data the patient could be a candidate for CPAP, he could use  a dental device to reduce his AHI by 50% or more and he could also be an inspire implant patient which can also help him to reduce his AHI by about 50% or more. In patients with a history of cardiac rhythm abnormalities we tend to recommend positive airway pressure above the other 2 alternative therapies.   I will be happy to provide either a sleep dentist referral or an ENT referral or a referral for an auto-titration CPAP device.   In-lab sleep study 07/31/19 Expanded EEG/ Parasomnia Montage Polysomnogram  HISTORY:   [name] [name] is a 63 y.o. Caucasian male patient  who was seen here on 05-11-2019 upon referral from Dr. Orlan Cramp  (Allergy  and Asthma) for a sleep apnea re- evaluation in the  setting of excessive daytime sleepiness. The patient is  interested in an [place] procedure.   The patient endorsed the Epworth Sleepiness Scale at 15 points.   The patient's weight 227 pounds with a height of 70 (inches),  resulting in a BMI of 32.5 kg/m2.  The patient's neck circumference measured 18 inches.   CURRENT MEDICATIONS: Lipitor, Sinequan , Cymbalta , Jardiance ,  Apresoline , Lantus , Zestril , Flomax , Verapamil      1. Mild to moderate Obstructive Sleep Apnea (OSA) at AHI 14.3  with a total of 72 events. Very unusual  distribution, mostly  sparing REM sleep.  2. Severe Periodic Limb Movement Disorder (PLMD), causing many  arousals and fragmenting sleep, but not seen in REM sleep.  3. Primary Snoring and Bruxism.  4. Prolonged REM sleep latency with rebounding REM sleep in the  last 1.5 hours of sleep.      Past Medical History:  Diagnosis Date   Anxiety    Coronary artery disease    Depression    Diabetes mellitus    Type 2   Dyspnea    Enlarged prostate    Environmental allergies    Environmental and seasonal allergies    GERD (gastroesophageal reflux disease)    Headache(784.0)    sinus and migraines   Hyperlipidemia    Hypertension    IBS (irritable bowel syndrome)    Low testosterone     Myocardial infarction (HCC)    Pneumonia    S/P angioplasty with stent to distal LCX and mid LCX both DES 07/25/23 07/26/2023   Sleep apnea    has used BiPAP in past; last sleep study >10 years    Past Surgical History:  Procedure Laterality Date   ATRIAL FIBRILLATION ABLATION N/A 02/01/2024   Procedure: ATRIAL FIBRILLATION ABLATION;  Surgeon: Kennyth Chew, MD;  Location: [facility] INVASIVE CV LAB;  Service: Cardiovascular;  Laterality: N/A;   COLONOSCOPY  09/2018   normal, repeat in 10 years per patient.  [place], [place]   CORONARY STENT INTERVENTION N/A 11/10/2023   Procedure: CORONARY STENT INTERVENTION;  Surgeon: Wonda Sharper, MD;  Location[facility] INVASIVE CV LAB;  Service: Cardiovascular;  Laterality: N/A;   CORONARY ULTRASOUND/IVUS N/A 11/10/2023   Procedure: Coronary Ultrasound/IVUS;  Surgeon: Wonda Sharper, MD;  Location: [facility] INVASIVE CV LAB;  Service: Cardiovascular;  Laterality: N/A;   CORONARY/GRAFT ACUTE MI REVASCULARIZATION N/A 07/25/2023   Procedure: Coronary/Graft Acute MI Revascularization;  Surgeon: Wonda Sharper, MD;  Location: [facility] INVASIVE CV LAB;  Service: Cardiovascular;  Laterality: N/A;   DRUG INDUCED ENDOSCOPY N/A 05/10/2024   Procedure: DRUG INDUCED SLEEP ENDOSCOPY;  Surgeon:  Okey Burns, MD;  Location: [facility] OR;  Service: ENT;  Laterality: N/A;   KNEE ARTHROSCOPY Left    LEFT HEART CATH AND  CORONARY ANGIOGRAPHY N/A 07/25/2023   Procedure: LEFT HEART CATH AND CORONARY ANGIOGRAPHY;  Surgeon: Wonda Sharper, MD;  Location: [facility] INVASIVE CV LAB;  Service: Cardiovascular;  Laterality: N/A;   RENAL ANGIOGRAPHY N/A 07/16/2020   Procedure: RENAL ANGIOGRAPHY;  Surgeon: Ladona Heinz, MD;  Location: [facility] INVASIVE CV LAB;  Service: Cardiovascular;  Laterality: N/A;   SACROILIAC JOINT FUSION Right 05/02/2014   Procedure: SACROILIAC JOINT FUSION;  Surgeon: Oneil Rodgers Priestly, MD;  Location: [facility] OR;  Service: Orthopedics;  Laterality: Right;  Right sided sacroiliac joint fusion   TONSILLECTOMY     uvula removed    Family History  Problem Relation Age of Onset   Cancer Mother        lung   Cancer Father        prostate, mets to bone, lung   Cancer Brother        melanoma   Heart disease Maternal Grandmother        MI   Lung disease Maternal Grandfather        black lung   Heart disease Paternal Grandmother    Stroke Paternal Grandmother    Heart disease Paternal Grandfather    Heart disease Brother 10       MI   Diabetes Brother     Social History:  reports that he quit smoking about 32 years ago. His smoking use included cigarettes. He started smoking about 42 years ago. He has a 10 pack-year smoking history. He has never used smokeless tobacco. He reports that he does not drink alcohol and does not use drugs.  Allergies:  Allergies  Allergen Reactions   Pravastatin Other (See Comments)    Joint myalgias   Trazodone And Nefazodone     Had restless legs   Ezetimibe  Other (See Comments)    Myalgias, joint pain   Hydrochlorothiazide  Other (See Comments)    lightheadedness   Latex Rash   Metformin  And Related Diarrhea    Medications: I have reviewed the patient's current medications.  The PMH, PSH, Medications, Allergies, and SH were reviewed and  updated.  ROS: Constitutional: Negative for fever, weight loss and weight gain. Cardiovascular: Negative for chest pain and dyspnea on exertion. Respiratory: Is not experiencing shortness of breath at rest. Gastrointestinal: Negative for nausea and vomiting. Neurological: Negative for headaches. Psychiatric: The patient is not nervous/anxious  There were no vitals taken for this visit. There is no height or weight on file to calculate BMI.  PHYSICAL EXAM:  Exam: General: Well-developed, well-nourished Respiratory Respiratory effort: Equal inspiration and expiration without stridor Cardiovascular Peripheral Vascular: Warm extremities with equal color/perfusion Eyes: No nystagmus with equal extraocular motion bilaterally Neuro/Psych/Balance: Patient oriented to person, place, and time; Appropriate mood and affect; Gait is intact with no imbalance; Cranial nerves I-XII are intact Head and Face Inspection: Normocephalic and atraumatic without mass or lesion Palpation: Facial skeleton intact without bony stepoffs Salivary Glands: No mass or tenderness  Facial Strength: Facial motility symmetric and full bilaterally ENT Pinna: External ear intact and fully developed External canal: Canal is patent with intact skin Tympanic Membrane: Clear and mobile External Nose: No scar or anatomic deformity Internal Nose: Septum is relatively straight on anterior rhinoscopy. Bilateral inferior turbinate hypertrophy.  Lips, Teeth, and gums: Mucosa and teeth intact and viable Oral cavity/oropharynx: No erythema or exudate, no lesions present Neck Neck and Trachea: Midline trachea without mass or lesion Thyroid : No mass or nodularity Lymphatics: No lymphadenopathy   Studies Reviewed: HST done on 03/07/24  ESS 19/24 BMI 30.4 AHI 19.2 (3%) NO central apneas, no significant hypoxia   Allergy  Testing    CT max/face 04/06/24  IMPRESSION: 1. Rhinosinusitis: Symmetric nasal cavity mucosal  thickening with retained secretions, and generalized mild mucosal thickening circumferentially within the paranasal sinuses - mild-to-moderate in the maxillary sinuses. Mild associated maxillary periosteal thickening, hypoplastic right maxillary sinus. Hypoplastic frontal sinuses. 2. Right OMC is opacified. Other sinus drainage pathways appear to remain patent. 3. Note also Left Onodi cell.  Assessment/Plan: No diagnosis found.   Assessment and Plan Assessment & Plan Obstructive Sleep Apnea (OSA) CPAP intolerance  OSA with intolerance to CPAP and ineffective past surgery (UPPP). Concern for cardiovascular risks of untreated OSA 2/2 hx of Afib and CAD, cardiac stents. Inspire therapy considered due to high success rate for moderate to severe OSA. BMI 28.2 today. HST 02/2024 with AHI of 19.2.   OSA, moderate to severe, without multilevel collapse, with failure to tolerate PAP therapy and/or more conservative measures. Presence of smaller/absent tonsils and larger tongue position (Friedman tongue position or modified Mallampati) suggests that hypopharyngeal/retrolingual collapse is contributing to the patient's OSA. Janeth, M et al. Staging of obstructive sleep apnea/hypopnea syndrome: a guide to appropriate treatment. Laryngoscope, 2004 Mar, 114(3):454-9. PMID: [id number]) Options including positional therapy, weight loss, oral appliances, PAP and surgical correction discussed. Pt is not an ideal candidate for oral appliance due to severity of OSA Pt could be a candidate for Hypoglossal nerve stimulation (Inspire therapy) pending DISE   - Schedule drug-induced sleep endoscopy to assess airway collapse. - Discussed Inspire therapy as a treatment option. - Contact cardiologist for surgical clearance if Inspire therapy is pursued. Hx of Afib CAD on Eliquis  (cardiac stents)  Deviated Nasal Septum/ITH  Deviated septum causing nasal obstruction, potentially affecting CPAP tolerance. Septoplasty  may aid with CPAP tolerance but will not cure for OSA. Reports chronic sinusitis sx. No pus or purulence on exam today including nasal endoscopy. - Order sinus CT scan to assess for chronic sinus inflammation. - Prescribe Flonase  for nasal congestion. - Recommend nasal saline rinses. - Discuss potential septoplasty if medical management is insufficient.  Chronic Nasal Congestion Chronic nasal congestion unresponsive to allergy  treatments. Persistent bilateral nasal obstruction. Combination therapy advised. - Prescribe Zyrtec  for allergy  management. - Recommend combination therapy with Flonase  and nasal saline rinses + Flonase  BID. - CT sinuses to rule out chronic sinus inflammation  Update 07/26/24 Obstructive sleep apnea scheduled for hypoglossal nerve stimulator implantation Obstructive sleep apnea significantly impacts quality of life. Scheduled for hypoglossal nerve stimulator implantation on August 13th. Discussed procedure details, post-operative pain management, and need for post-surgery assistance. Patient eager to proceed. - Proceed with hypoglossal nerve stimulator implantation on August 13th. - Advise no strenuous activity for four weeks post-surgery. - Manage post-operative pain with narcotics initially, then transition to Tylenol  and Motrin. - Schedule follow-up in a couple of weeks post-surgery to check on recovery. - Coordinate follow-up with sleep clinic for device activation and titration.  Chronic nasal congestion with nasal drainage  Sinus scan 03/2024 with clear paranasal sinuses no evidence of CRS, indicating nasal congestion is the main issue. Current treatment includes Zyrtec  and ipratropium nasal spray. Discussed switching to Xyzal  and using nasal saline rinses. - Prescribe Xyzal  as an alternative to Zyrtec . - Recommend nasal saline rinses to reduce nasal drainage. - Flonase  BID  Deviated nasal septum and Inferior turbinate hypertrophy Deviated nasal septum with more  open right side. Discussed potential surgical correction but advised against combining with [facility]  surgery - Consider surgical correction of deviated septum after recovery from Inspire surgery if sx will not improve   Thank you for allowing me to participate in the care of this patient. Please do not hesitate to contact me with any questions or concerns.   Elena Larry, MD Otolaryngology [facility] Phone: [phone] Fax: [phone]    07/26/2024, 1:03 PM

## 2024-07-26 NOTE — Patient Instructions (Signed)
 [name] [name] Med Nasal Saline Rinse   - start nasal saline rinses with [employer]Med Bottle available over the counter or online to help with nasal congestion

## 2024-07-26 NOTE — H&P (View-Only) (Signed)
 ENT Progress Note:   Update 07/26/2024  Discussed the use of AI scribe software for clinical note transcription with the patient, who gave verbal consent to proceed.  History of Present Illness  Discussed the use of AI scribe software for clinical note transcription with the patient, who gave verbal consent to proceed.  History of Present Illness [name] [name] is a 63 year old male with obstructive sleep apnea who presents for pre-operative consultation for Inspire device implantation, surgery scheduled for 08/09/24. No new illnesses admissions or medications since last office visit on 03/30/24.  He has been unable to tolerate a CPAP mask for decades and is eager to improve his sleep quality without it. He experiences daytime fatigue and frequent yawning. He recalls a brief period when he could wear a CPAP mask and felt more awake after six hours of sleep, but this has not been the case for a long time.  He inquires about his deviated septum and nasal congestion, which he manages with a nasal spray. The spray is effective, but he sometimes struggles with its application.   His past allergy  tests as a child showed positive results for multiple allergens, but recent tests only indicated mold and some tree pollen.   Records Reviewed:  Initial Evaluation  Reason for Consult: OSA CPAP intolerance  and chronic nasal congestion and hx of deviated septum  HPI: Discussed the use of AI scribe software for clinical note transcription with the patient, who gave verbal consent to proceed.  History of Present Illness [name] [name] is a 63 year old male with hx of moderate sleep apnea who presents with difficulty tolerating CPAP therapy and chronic nasal congestion/nasal obstruction. He was referred by his his neurologist at [facility] for evaluation of sleep apnea and difficulty tolerating CPAP therapy.  He has a history of sleep apnea diagnosed in the early 2000s and experiences difficulty tolerating CPAP  therapy, primarily due to his sleeping position on his stomach, which affects the mask seal. He has tried various masks without success and is not currently using CPAP. He underwent tonsillectomy and uvulopalatopharyngoplasty, but this procedure did not resolve his sleep apnea.  He has a deviated septum and chronic nasal congestion, with both sides feeling clogged. He experiences constant sinus drainage and has been allergy  tested, but allergy  shots were ineffective (tried for 6 months without significant relief). He has tried Flonase  and Zyrtec , which provide some relief but do not stop the postnasal drainage. No frequent sinus infections, but he recently had a brief episode of acute sinusitis with epistaxis.  He has a significant cardiac history, including a heart attack, three stents, and ablation surgery for atrial fibrillation within the past year. He is concerned about the cardiovascular risks associated with untreated sleep apnea. He is currently on Eliquis  for atrial fibrillation. No history of insomnia.    BMI today 28.27  Records Reviewed:  [facility] Note from 03/07/24 ( home sleep study report) CLINICAL INFORMATION/HISTORY: patient with history of CAD/ angioplasty, atrial fib and DM on Insulin . [name] [name] is a 63 y.o. male patient who is seen upon  Dr Rebbeca new referral on 02/04/2024 for a Sleep Medicine consultation.    Chief concern according to patient :  I had a heart attack in July and that cause me to change my lifestyle, my diet.    I have the pleasure of seeing [name] 02/04/24 a right -handed male with a possible sleep disorder.    The patient had the first sleep  study in the year 2020 at [place] [facility] - AHI 14.3/h and he had already been treated on  BiPAP-  all hypopneas- not apneas-  habitual mouth breather ,  an oxygen saturation Nadir at SP02 86%. Total hypoxia time 25 minutes . He has trouble to breathe when sleeping flat .  History of  mild OSA, hypopnea-  has a BIPAP but not used in 5 years. Excessive daytime  sleepiness, snoring,  mouth breathing -loud snoring Overbite .  status post UPPP>  nasal septal deviation, sinusitis -. Depression - morning headaches daily !       Sleep relevant medical history: UPPP/ Tonsillectomy, sinuitis,  congested, nasal congestion.  Nocturia/ Enuresis  1-2 , as investigated for  Parasomnia , had a Tonsillectomy,   repair/ UPPP? ENT cpnsult showed deviated septum. Chief complaint according to patient : I am soo sleepy, so very tired, HYPERSOMNIA . Sleep and medical history: [name] [name] reports having been tested at  [facility] and later at [facility] center (over 5 years ago ) - he remembers being diagnosed with 118 apneas per hour, and that he had his first sleep study taken in a seated position due to orthopnea. He started off on his CPAP could not tolerated was changed to a BiPAP and still could not tolerate it.  His positive airway pressure intolerance was closely linked to a condition of chronic sinusitis, sinus congestion and a known seasonal pattern. He underwent a tonsillectomy but his sinus headaches responded, his nasal airflow was still restricted.  At the allergy  and asthma center it was documented that he had a tonsillectomy and uvuloplasty, but he continues to have very poor quality sleep, that he is using fluticasone  nasal spray and that he tested positive for mold and certain tree allergens.  His obstructive sleep apnea syndrome has been untreated for many years now.  He also has sacroiliitis, radiculopathy which was not further specified degeneration of the lumbar or lumbosacral intervertebral discs were documented. DM2, anxiety.      I will  first order a HST and if positive again, we should try for in lab  titration . I will also refer to Dr Saldatova.   Epworth sleepiness score: 19/ 24 points   FSS endorsed at 63/ 63 points.  13/ 15 on GDS (!!)     BMI: 30.4 kg/m   Neck Circumference: 18    Sleep Summary:   Total Recording Time (hours, min): 8 hours 22 minutes      Total Sleep Time (hours, min):        7 hours 19 minutes         Percent REM (%):     9%                                   Respiratory Indices following AASM criteria:   Calculated pAHI (per hour):     19.2/h                                   IMPRESSION:  This HST confirms the presence of moderate and all obstructive sleep apnea which was not REM sleep dependent and exacerbated in supine sleep position.  There was no associated hypoxemia   RECOMMENDATION: Based on these data the patient could be a candidate for CPAP, he could use  a dental device to reduce his AHI by 50% or more and he could also be an inspire implant patient which can also help him to reduce his AHI by about 50% or more. In patients with a history of cardiac rhythm abnormalities we tend to recommend positive airway pressure above the other 2 alternative therapies.   I will be happy to provide either a sleep dentist referral or an ENT referral or a referral for an auto-titration CPAP device.   In-lab sleep study 07/31/19 Expanded EEG/ Parasomnia Montage Polysomnogram  HISTORY:   Mr. Md Smola is a 63 y.o. Caucasian male patient  who was seen here on 05-11-2019 upon referral from Dr. Orlan Cramp  (Allergy  and Asthma) for a sleep apnea re- evaluation in the  setting of excessive daytime sleepiness. The patient is  interested in an [place] procedure.   The patient endorsed the Epworth Sleepiness Scale at 15 points.   The patient's weight 227 pounds with a height of 70 (inches),  resulting in a BMI of 32.5 kg/m2.  The patient's neck circumference measured 18 inches.   CURRENT MEDICATIONS: Lipitor, Sinequan , Cymbalta , Jardiance ,  Apresoline , Lantus , Zestril , Flomax , Verapamil      1. Mild to moderate Obstructive Sleep Apnea (OSA) at AHI 14.3  with a total of 72 events. Very unusual  distribution, mostly  sparing REM sleep.  2. Severe Periodic Limb Movement Disorder (PLMD), causing many  arousals and fragmenting sleep, but not seen in REM sleep.  3. Primary Snoring and Bruxism.  4. Prolonged REM sleep latency with rebounding REM sleep in the  last 1.5 hours of sleep.      Past Medical History:  Diagnosis Date   Anxiety    Coronary artery disease    Depression    Diabetes mellitus    Type 2   Dyspnea    Enlarged prostate    Environmental allergies    Environmental and seasonal allergies    GERD (gastroesophageal reflux disease)    Headache(784.0)    sinus and migraines   Hyperlipidemia    Hypertension    IBS (irritable bowel syndrome)    Low testosterone     Myocardial infarction (HCC)    Pneumonia    S/P angioplasty with stent to distal LCX and mid LCX both DES 07/25/23 07/26/2023   Sleep apnea    has used BiPAP in past; last sleep study >10 years    Past Surgical History:  Procedure Laterality Date   ATRIAL FIBRILLATION ABLATION N/A 02/01/2024   Procedure: ATRIAL FIBRILLATION ABLATION;  Surgeon: Kennyth Chew, MD;  Location: [facility] INVASIVE CV LAB;  Service: Cardiovascular;  Laterality: N/A;   COLONOSCOPY  09/2018   normal, repeat in 10 years per patient.  [place], [place]   CORONARY STENT INTERVENTION N/A 11/10/2023   Procedure: CORONARY STENT INTERVENTION;  Surgeon: Wonda Sharper, MD;  Location[facility] INVASIVE CV LAB;  Service: Cardiovascular;  Laterality: N/A;   CORONARY ULTRASOUND/IVUS N/A 11/10/2023   Procedure: Coronary Ultrasound/IVUS;  Surgeon: Wonda Sharper, MD;  Location: [facility] INVASIVE CV LAB;  Service: Cardiovascular;  Laterality: N/A;   CORONARY/GRAFT ACUTE MI REVASCULARIZATION N/A 07/25/2023   Procedure: Coronary/Graft Acute MI Revascularization;  Surgeon: Wonda Sharper, MD;  Location: [facility] INVASIVE CV LAB;  Service: Cardiovascular;  Laterality: N/A;   DRUG INDUCED ENDOSCOPY N/A 05/10/2024   Procedure: DRUG INDUCED SLEEP ENDOSCOPY;  Surgeon:  Okey Burns, MD;  Location: [facility] OR;  Service: ENT;  Laterality: N/A;   KNEE ARTHROSCOPY Left    LEFT HEART CATH AND  CORONARY ANGIOGRAPHY N/A 07/25/2023   Procedure: LEFT HEART CATH AND CORONARY ANGIOGRAPHY;  Surgeon: Wonda Sharper, MD;  Location: [facility] INVASIVE CV LAB;  Service: Cardiovascular;  Laterality: N/A;   RENAL ANGIOGRAPHY N/A 07/16/2020   Procedure: RENAL ANGIOGRAPHY;  Surgeon: Ladona Heinz, MD;  Location: [facility] INVASIVE CV LAB;  Service: Cardiovascular;  Laterality: N/A;   SACROILIAC JOINT FUSION Right 05/02/2014   Procedure: SACROILIAC JOINT FUSION;  Surgeon: Oneil Rodgers Priestly, MD;  Location: [facility] OR;  Service: Orthopedics;  Laterality: Right;  Right sided sacroiliac joint fusion   TONSILLECTOMY     uvula removed    Family History  Problem Relation Age of Onset   Cancer Mother        lung   Cancer Father        prostate, mets to bone, lung   Cancer Brother        melanoma   Heart disease Maternal Grandmother        MI   Lung disease Maternal Grandfather        black lung   Heart disease Paternal Grandmother    Stroke Paternal Grandmother    Heart disease Paternal Grandfather    Heart disease Brother 10       MI   Diabetes Brother     Social History:  reports that he quit smoking about 32 years ago. His smoking use included cigarettes. He started smoking about 42 years ago. He has a 10 pack-year smoking history. He has never used smokeless tobacco. He reports that he does not drink alcohol and does not use drugs.  Allergies:  Allergies  Allergen Reactions   Pravastatin Other (See Comments)    Joint myalgias   Trazodone And Nefazodone     Had restless legs   Ezetimibe  Other (See Comments)    Myalgias, joint pain   Hydrochlorothiazide  Other (See Comments)    lightheadedness   Latex Rash   Metformin  And Related Diarrhea    Medications: I have reviewed the patient's current medications.  The PMH, PSH, Medications, Allergies, and SH were reviewed and  updated.  ROS: Constitutional: Negative for fever, weight loss and weight gain. Cardiovascular: Negative for chest pain and dyspnea on exertion. Respiratory: Is not experiencing shortness of breath at rest. Gastrointestinal: Negative for nausea and vomiting. Neurological: Negative for headaches. Psychiatric: The patient is not nervous/anxious  There were no vitals taken for this visit. There is no height or weight on file to calculate BMI.  PHYSICAL EXAM:  Exam: General: Well-developed, well-nourished Respiratory Respiratory effort: Equal inspiration and expiration without stridor Cardiovascular Peripheral Vascular: Warm extremities with equal color/perfusion Eyes: No nystagmus with equal extraocular motion bilaterally Neuro/Psych/Balance: Patient oriented to person, place, and time; Appropriate mood and affect; Gait is intact with no imbalance; Cranial nerves I-XII are intact Head and Face Inspection: Normocephalic and atraumatic without mass or lesion Palpation: Facial skeleton intact without bony stepoffs Salivary Glands: No mass or tenderness  Facial Strength: Facial motility symmetric and full bilaterally ENT Pinna: External ear intact and fully developed External canal: Canal is patent with intact skin Tympanic Membrane: Clear and mobile External Nose: No scar or anatomic deformity Internal Nose: Septum is relatively straight on anterior rhinoscopy. Bilateral inferior turbinate hypertrophy.  Lips, Teeth, and gums: Mucosa and teeth intact and viable Oral cavity/oropharynx: No erythema or exudate, no lesions present Neck Neck and Trachea: Midline trachea without mass or lesion Thyroid : No mass or nodularity Lymphatics: No lymphadenopathy   Studies Reviewed: HST done on 03/07/24  ESS 19/24 BMI 30.4 AHI 19.2 (3%) NO central apneas, no significant hypoxia   Allergy  Testing    CT max/face 04/06/24  IMPRESSION: 1. Rhinosinusitis: Symmetric nasal cavity mucosal  thickening with retained secretions, and generalized mild mucosal thickening circumferentially within the paranasal sinuses - mild-to-moderate in the maxillary sinuses. Mild associated maxillary periosteal thickening, hypoplastic right maxillary sinus. Hypoplastic frontal sinuses. 2. Right OMC is opacified. Other sinus drainage pathways appear to remain patent. 3. Note also Left Onodi cell.  Assessment/Plan: No diagnosis found.   Assessment and Plan Assessment & Plan Obstructive Sleep Apnea (OSA) CPAP intolerance  OSA with intolerance to CPAP and ineffective past surgery (UPPP). Concern for cardiovascular risks of untreated OSA 2/2 hx of Afib and CAD, cardiac stents. Inspire therapy considered due to high success rate for moderate to severe OSA. BMI 28.2 today. HST 02/2024 with AHI of 19.2.   OSA, moderate to severe, without multilevel collapse, with failure to tolerate PAP therapy and/or more conservative measures. Presence of smaller/absent tonsils and larger tongue position (Friedman tongue position or modified Mallampati) suggests that hypopharyngeal/retrolingual collapse is contributing to the patient's OSA. Janeth, M et al. Staging of obstructive sleep apnea/hypopnea syndrome: a guide to appropriate treatment. Laryngoscope, 2004 Mar, 114(3):454-9. PMID: [id number]) Options including positional therapy, weight loss, oral appliances, PAP and surgical correction discussed. Pt is not an ideal candidate for oral appliance due to severity of OSA Pt could be a candidate for Hypoglossal nerve stimulation (Inspire therapy) pending DISE   - Schedule drug-induced sleep endoscopy to assess airway collapse. - Discussed Inspire therapy as a treatment option. - Contact cardiologist for surgical clearance if Inspire therapy is pursued. Hx of Afib CAD on Eliquis  (cardiac stents)  Deviated Nasal Septum/ITH  Deviated septum causing nasal obstruction, potentially affecting CPAP tolerance. Septoplasty  may aid with CPAP tolerance but will not cure for OSA. Reports chronic sinusitis sx. No pus or purulence on exam today including nasal endoscopy. - Order sinus CT scan to assess for chronic sinus inflammation. - Prescribe Flonase  for nasal congestion. - Recommend nasal saline rinses. - Discuss potential septoplasty if medical management is insufficient.  Chronic Nasal Congestion Chronic nasal congestion unresponsive to allergy  treatments. Persistent bilateral nasal obstruction. Combination therapy advised. - Prescribe Zyrtec  for allergy  management. - Recommend combination therapy with Flonase  and nasal saline rinses + Flonase  BID. - CT sinuses to rule out chronic sinus inflammation  Update 07/26/24 Obstructive sleep apnea scheduled for hypoglossal nerve stimulator implantation Obstructive sleep apnea significantly impacts quality of life. Scheduled for hypoglossal nerve stimulator implantation on August 13th. Discussed procedure details, post-operative pain management, and need for post-surgery assistance. Patient eager to proceed. - Proceed with hypoglossal nerve stimulator implantation on August 13th. - Advise no strenuous activity for four weeks post-surgery. - Manage post-operative pain with narcotics initially, then transition to Tylenol  and Motrin. - Schedule follow-up in a couple of weeks post-surgery to check on recovery. - Coordinate follow-up with sleep clinic for device activation and titration.  Chronic nasal congestion with nasal drainage  Sinus scan 03/2024 with clear paranasal sinuses no evidence of CRS, indicating nasal congestion is the main issue. Current treatment includes Zyrtec  and ipratropium nasal spray. Discussed switching to Xyzal  and using nasal saline rinses. - Prescribe Xyzal  as an alternative to Zyrtec . - Recommend nasal saline rinses to reduce nasal drainage. - Flonase  BID  Deviated nasal septum and Inferior turbinate hypertrophy Deviated nasal septum with more  open right side. Discussed potential surgical correction but advised against combining with [facility]  surgery - Consider surgical correction of deviated septum after recovery from Inspire surgery if sx will not improve   Thank you for allowing me to participate in the care of this patient. Please do not hesitate to contact me with any questions or concerns.   Elena Larry, MD Otolaryngology [facility] Phone: [phone] Fax: [phone]    07/26/2024, 1:03 PM

## 2024-07-28 ENCOUNTER — Other Ambulatory Visit: Payer: Self-pay | Admitting: Cardiovascular Disease

## 2024-07-28 ENCOUNTER — Other Ambulatory Visit (HOSPITAL_COMMUNITY): Payer: Self-pay

## 2024-07-29 NOTE — Telephone Encounter (Signed)
 OK to renew this. Thank you

## 2024-07-31 ENCOUNTER — Other Ambulatory Visit (HOSPITAL_COMMUNITY): Payer: Self-pay

## 2024-08-01 ENCOUNTER — Other Ambulatory Visit (HOSPITAL_COMMUNITY): Payer: Self-pay

## 2024-08-01 MED ORDER — ASPIRIN 81 MG PO TBEC: 81.0000 mg | DELAYED_RELEASE_TABLET | Freq: Every day | ORAL | 2 refills | Status: AC

## 2024-08-01 NOTE — Telephone Encounter (Signed)
 Patient is following up on the refill request. Information listed below for reference -    *STAT* If patient is at the pharmacy, call can be transferred to refill team.   1. Which medications need to be refilled? (please list name of each medication and dose if known) aspirin  EC 81 MG tablet   2. Which pharmacy/location (including street and city if local pharmacy) is medication to be sent to?  [id number] - [place], [place] - [address]    3. Do they need a 30 day or 90 day supply? 90 day

## 2024-08-02 ENCOUNTER — Encounter (HOSPITAL_COMMUNITY): Payer: Self-pay

## 2024-08-02 ENCOUNTER — Other Ambulatory Visit: Payer: Self-pay | Admitting: Emergency Medicine

## 2024-08-02 NOTE — Pre-Procedure Instructions (Signed)
 Surgical Instructions   Your procedure is scheduled on August 09, 2024. Report to [facility] Main Entrance A at 7:20 A.M., then check in with the Admitting office. Any questions or running late day of surgery: call [phone]  Questions prior to your surgery date: call [phone], Monday-Friday, 8am-4pm. If you experience any cold or flu symptoms such as cough, fever, chills, shortness of breath, etc. between now and your scheduled surgery, please notify us  at the above number.     Remember:  Do not eat after midnight the night before your surgery   You may drink clear liquids until 6:20 AM the morning of your surgery.   Clear liquids allowed are: Water, Non-Citrus Juices (without pulp), Carbonated Beverages, Clear Tea (no milk, honey, etc.), Black Coffee Only (NO MILK, CREAM OR POWDERED CREAMER of any kind), and Gatorade.    Take these medicines the morning of surgery with A SIP OF WATER: amLODipine  (NORVASC )  atorvastatin  (LIPITOR)  carvedilol  (COREG )  cetirizine  (ZYRTEC )  pantoprazole  (PROTONIX )  vortioxetine  HBr (TRINTELLIX )    May take these medicines IF NEEDED: acetaminophen  (TYLENOL )  ipratropium (ATROVENT )  nitroGLYCERIN  (NITROSTAT ) - if dose taken prior to surgery, please call [phone]   Follow your surgeon's instructions on when to stop apixaban  (ELIQUIS ) and Aspirin .  If no instructions were given by your surgeon then you will need to call the office to get those instructions.     One week prior to surgery, STOP taking any Aleve , Naproxen , Ibuprofen, Motrin, Advil, Goody's, BC's, all herbal medications, fish oil, and non-prescription vitamins.   WHAT DO I DO ABOUT MY DIABETES MEDICATION?   STOP taking your empagliflozin  (JARDIANCE ) three days prior to surgery. Your last dose will be August 9th.  STOP taking your Semaglutide  one week prior to surgery. DO NOT take any doses after August 5th.  THE NIGHT BEFORE SURGERY, take 25 units of insulin  glargine  (LANTUS  SOLOSTAR).      DO NOT take your bedtime dose of insulin  lispro (HUMALOG  KWIKPEN) the night before surgery.  Morning of surgery, if your blood sugar is greater than 220, you may take  of your sliding scale insulin  lispro (HUMALOG  KWIKPEN).   HOW TO MANAGE YOUR DIABETES BEFORE AND AFTER SURGERY  Why is it important to control my blood sugar before and after surgery? Improving blood sugar levels before and after surgery helps healing and can limit problems. A way of improving blood sugar control is eating a healthy diet by:  Eating less sugar and carbohydrates  Increasing activity/exercise  Talking with your doctor about reaching your blood sugar goals High blood sugars (greater than 180 mg/dL) can raise your risk of infections and slow your recovery, so you will need to focus on controlling your diabetes during the weeks before surgery. Make sure that the doctor who takes care of your diabetes knows about your planned surgery including the date and location.  How do I manage my blood sugar before surgery? Check your blood sugar at least 4 times a day, starting 2 days before surgery, to make sure that the level is not too high or low.  Check your blood sugar the morning of your surgery when you wake up and every 2 hours until you get to the Short Stay unit.  If your blood sugar is less than 70 mg/dL, you will need to treat for low blood sugar: Do not take insulin . Treat a low blood sugar (less than 70 mg/dL) with  cup of clear juice (cranberry or apple),  4 glucose tablets, OR glucose gel. Recheck blood sugar in 15 minutes after treatment (to make sure it is greater than 70 mg/dL). If your blood sugar is not greater than 70 mg/dL on recheck, call [phone] for further instructions. Report your blood sugar to the short stay nurse when you get to Short Stay.  If you are admitted to the hospital after surgery: Your blood sugar will be checked by the staff and you will probably  be given insulin  after surgery (instead of oral diabetes medicines) to make sure you have good blood sugar levels. The goal for blood sugar control after surgery is 80-180 mg/dL.             Do NOT Smoke (Tobacco/Vaping) for 24 hours prior to your procedure.  If you use a CPAP at night, you may bring your mask/headgear for your overnight stay.   You will be asked to remove any contacts, glasses, piercing's, hearing aid's, dentures/partials prior to surgery. Please bring cases for these items if needed.    Patients discharged the day of surgery will not be allowed to drive home, and someone needs to stay with them for 24 hours.  SURGICAL WAITING ROOM VISITATION Patients may have no more than 2 support people in the waiting area - these visitors may rotate.   Pre-op nurse will coordinate an appropriate time for 1 ADULT support person, who may not rotate, to accompany patient in pre-op.  Children under the age of 63 must have an adult with them who is not the patient and must remain in the main waiting area with an adult.  If the patient needs to stay at the hospital during part of their recovery, the visitor guidelines for inpatient rooms apply.  Please refer to the [facility] website for the visitor guidelines for any additional information.   If you received a COVID test during your pre-op visit  it is requested that you wear a mask when out in public, stay away from anyone that may not be feeling well and notify your surgeon if you develop symptoms. If you have been in contact with anyone that has tested positive in the last 10 days please notify you surgeon.      Pre-operative CHG Bathing Instructions   You can play a key role in reducing the risk of infection after surgery. Your skin needs to be as free of germs as possible. You can reduce the number of germs on your skin by washing with CHG (chlorhexidine  gluconate) soap before surgery. CHG is an antiseptic soap that kills germs and  continues to kill germs even after washing.   DO NOT use if you have an allergy  to chlorhexidine /CHG or antibacterial soaps. If your skin becomes reddened or irritated, stop using the CHG and notify one of our RNs at [phone].              TAKE A SHOWER THE NIGHT BEFORE SURGERY AND THE DAY OF SURGERY    Please keep in mind the following:  DO NOT shave, including legs and underarms, 48 hours prior to surgery.   You may shave your face before/day of surgery.  Place clean sheets on your bed the night before surgery Use a clean washcloth (not used since being washed) for each shower. DO NOT sleep with pet's night before surgery.  CHG Shower Instructions:  Wash your face and private area with normal soap. If you choose to wash your hair, wash first with your normal shampoo.  After you use shampoo/soap, rinse your hair and body thoroughly to remove shampoo/soap residue.  Turn the water OFF and apply half the bottle of CHG soap to a CLEAN washcloth.  Apply CHG soap ONLY FROM YOUR NECK DOWN TO YOUR TOES (washing for 3-5 minutes)  DO NOT use CHG soap on face, private areas, open wounds, or sores.  Pay special attention to the area where your surgery is being performed.  If you are having back surgery, having someone wash your back for you may be helpful. Wait 2 minutes after CHG soap is applied, then you may rinse off the CHG soap.  Pat dry with a clean towel  Put on clean pajamas    Additional instructions for the day of surgery: DO NOT APPLY any lotions, deodorants, cologne, or perfumes.   Do not wear jewelry or makeup Do not wear nail polish, gel polish, artificial nails, or any other type of covering on natural nails (fingers and toes) Do not bring valuables to the hospital. [facility] is not responsible for valuables/personal belongings. Put on clean/comfortable clothes.  Please brush your teeth.  Ask your nurse before applying any prescription medications to the skin.

## 2024-08-03 ENCOUNTER — Telehealth: Payer: Self-pay

## 2024-08-03 ENCOUNTER — Encounter (HOSPITAL_COMMUNITY): Payer: Self-pay

## 2024-08-03 ENCOUNTER — Other Ambulatory Visit: Payer: Self-pay

## 2024-08-03 ENCOUNTER — Encounter (HOSPITAL_COMMUNITY)
Admission: RE | Admit: 2024-08-03 | Discharge: 2024-08-03 | Disposition: A | Discharge status: Home / Self Care | Source: Ambulatory Visit | Attending: Otolaryngology | Admitting: Otolaryngology

## 2024-08-03 VITALS — BP 111/65 | HR 71 | Temp 97.7°F | Resp 18 | Ht 70.0 in | Wt 198.2 lb

## 2024-08-03 DIAGNOSIS — Z79899 Other long term (current) drug therapy: Secondary | ICD-10-CM | POA: Insufficient documentation

## 2024-08-03 DIAGNOSIS — I480 Paroxysmal atrial fibrillation: Secondary | ICD-10-CM | POA: Insufficient documentation

## 2024-08-03 DIAGNOSIS — I2510 Atherosclerotic heart disease of native coronary artery without angina pectoris: Secondary | ICD-10-CM | POA: Insufficient documentation

## 2024-08-03 DIAGNOSIS — Z7901 Long term (current) use of anticoagulants: Secondary | ICD-10-CM | POA: Insufficient documentation

## 2024-08-03 DIAGNOSIS — Z7902 Long term (current) use of antithrombotics/antiplatelets: Secondary | ICD-10-CM | POA: Diagnosis not present

## 2024-08-03 DIAGNOSIS — G4733 Obstructive sleep apnea (adult) (pediatric): Secondary | ICD-10-CM | POA: Insufficient documentation

## 2024-08-03 DIAGNOSIS — Z01812 Encounter for preprocedural laboratory examination: Secondary | ICD-10-CM | POA: Diagnosis present

## 2024-08-03 DIAGNOSIS — I10 Essential (primary) hypertension: Secondary | ICD-10-CM | POA: Insufficient documentation

## 2024-08-03 DIAGNOSIS — K219 Gastro-esophageal reflux disease without esophagitis: Secondary | ICD-10-CM | POA: Insufficient documentation

## 2024-08-03 DIAGNOSIS — Z794 Long term (current) use of insulin: Secondary | ICD-10-CM | POA: Diagnosis not present

## 2024-08-03 DIAGNOSIS — I252 Old myocardial infarction: Secondary | ICD-10-CM | POA: Diagnosis not present

## 2024-08-03 DIAGNOSIS — E119 Type 2 diabetes mellitus without complications: Secondary | ICD-10-CM | POA: Insufficient documentation

## 2024-08-03 HISTORY — DX: Cardiac arrhythmia, unspecified: I49.9

## 2024-08-03 HISTORY — DX: Attention-deficit hyperactivity disorder, unspecified type: F90.9

## 2024-08-03 LAB — HEMOGLOBIN A1C
Hgb A1c MFr Bld: 5.3 % (ref 4.8–5.6)
Mean Plasma Glucose: 105 mg/dL

## 2024-08-03 LAB — CBC
HCT: 52.8 % — ABNORMAL HIGH (ref 39.0–52.0)
Hemoglobin: 17.3 g/dL — ABNORMAL HIGH (ref 13.0–17.0)
MCH: 30.5 pg (ref 26.0–34.0)
MCHC: 32.8 g/dL (ref 30.0–36.0)
MCV: 93 fL (ref 80.0–100.0)
Platelets: 228 K/uL (ref 150–400)
RBC: 5.68 MIL/uL (ref 4.22–5.81)
RDW: 13.7 % (ref 11.5–15.5)
WBC: 10.8 K/uL — ABNORMAL HIGH (ref 4.0–10.5)
nRBC: 0.2 % (ref 0.0–0.2)

## 2024-08-03 LAB — BASIC METABOLIC PANEL WITH GFR
Anion gap: 9 (ref 5–15)
BUN: 17 mg/dL (ref 8–23)
CO2: 27 mmol/L (ref 22–32)
Calcium: 9.5 mg/dL (ref 8.9–10.3)
Chloride: 106 mmol/L (ref 98–111)
Creatinine, Ser: 1.27 mg/dL — ABNORMAL HIGH (ref 0.61–1.24)
GFR, Estimated: >60 mL/min (ref >60)
Glucose, Bld: 134 mg/dL — ABNORMAL HIGH (ref 70–99)
Potassium: 4.7 mmol/L (ref 3.5–5.1)
Sodium: 142 mmol/L (ref 135–145)

## 2024-08-03 LAB — GLUCOSE, CAPILLARY: Glucose-Capillary: 109 mg/dL — ABNORMAL HIGH (ref 70–99)

## 2024-08-03 NOTE — Telephone Encounter (Signed)
 Copied from CRM [id number]. Topic: Clinical - Prescription Issue >> Aug 02, 2024  2:24 PM Corean SAUNDERS wrote: Reason for CRM: Patient states his pharmacy advised him that Dr. Shelah refused to refill his pantoprazole  (PROTONIX ) 40 MG tablet and patient does not understand why but is asking Dr. Shelah to please fill this for him and if not then to please call him and explain why this medication cannot be filled.  Please advise on last office visit it says okay to stop protonix ,patient is calling on why script was refused.

## 2024-08-03 NOTE — Progress Notes (Signed)
 PCP - Dr. Beverley Corp Cardiologist - Dr. Ozell Fell Electrophysiologist - Dr. Fonda Kitty Pulmonologist - Dr. Lamar Chris Endocrinologist - Dr. Donell Butts  PPM/ICD - Denies Device Orders - n/a Rep Notified - n/a  Chest x-ray - 04/11/2024 EKG - 05/02/2024 Stress Test - 03/11/2020 ECHO - 07/26/2023 Cardiac Cath - 11/10/2023  Sleep Study - +OSA, but unable to tolerate CPAP/BiPAP  Pt is DM2. He has Dexcom CGM on his right arm. Normal fasting glucose 100s. CBG at pre-op appointment 109. A1c result pending.  Last dose of GLP1 agonist- Last dose of Ozempic  was August 2nd GLP1 instructions: Pt instructed to not take anymore doses until after surgery  Blood Thinner Instructions: Pt instructed to contact surgeon office for Eliquis  instructions Aspirin  Instructions: Pt instructed to contact surgeon office for ASA instructions  ERAS Protcol - Clear liquids until 0620 morning of surgery PRE-SURGERY Ensure or G2- n/a  COVID TEST- n/a   Anesthesia review: Yes. Cardiac clearance (CAD with stent placed in 2024, HTN, A.fib pending watchman implant 08/2024) OSA, DM2.    Patient denies shortness of breath, fever, cough and chest pain at PAT appointment. Pt denies any respiratory illness/infection in the last two months.   All instructions explained to the patient, with a verbal understanding of the material. Patient agrees to go over the instructions while at home for a better understanding. Patient also instructed to self quarantine after being tested for COVID-19. The opportunity to ask questions was provided.

## 2024-08-03 NOTE — Telephone Encounter (Signed)
 If he feels like he needs to continue it then I am happy to refill

## 2024-08-03 NOTE — Telephone Encounter (Signed)
 Tried calling the pt regarding Pantoprazole , no answer- lvm to call back.  Per lov Okay to stop your pantoprazole 

## 2024-08-04 MED ORDER — PANTOPRAZOLE SODIUM 40 MG PO TBEC
40.0000 mg | DELAYED_RELEASE_TABLET | Freq: Every day | ORAL | 0 refills | Status: DC
Start: 2024-08-04 — End: 2024-11-03

## 2024-08-04 NOTE — Telephone Encounter (Signed)
 Copied from CRM [id number]. Topic: Clinical - Prescription Issue >> Aug 03, 2024  5:12 PM Rozanna MATSU wrote: Reason for CRM: PT IS RETURNING CALL FROM EARLIER MESSAGE, STATED HE NOT UNDERSTANDING WHY pantoprazole  (PROTONIX ) 40 MG IS NOT BEING REFIILLED. HE IS NOT ASKING TO BE TAKEN THE MEDS HE ASKING FOR A REFILL. STATED PHARMACY ADVISED DR BYRUM IS REFUSNG TO FILL.  Tried calling the patient again regarding Pantoprazole . No answer- lvm to call back.

## 2024-08-04 NOTE — Progress Notes (Signed)
 Anesthesia Chart Review:   63 year old male follows with cardiology for history of PAF on Eliquis  s/p ablation 02/01/2024, HTN, CAD s/p STEMI with DES to LCx (06/2023) and LAD (10/2023), OSA intolerant to CPAP.  Seen by Jodie Passey on 05/02/2024 and cleared for ENT surgery.  Per note, Cardiac Clearance for Inspire Implant: No concerns from a cardiac perspective.  The patient is at low risk to proceed without further work up. If the patient has new chest pain or SOB prior to surgery, they should be revaluated. CANNOT hold Plavix  until 5/13, if it needs to be held longer than 1 day, surgery will need to be moved back from 5/14. OK to hold eliquis  x 48 hours, then should resume as soon as safe to do so.  Patient subsequently seen by EP cardiologist Dr. Kennyth on 05/26/2024 for discussion of watchman/LAAO.  He is scheduled for this procedure on 09/14/2024.  Cardiology is aware of inspire implant and has coordinated timing with patient to allow for inspire implant to be done 08/09/2024.   Follows with pulmonology for history of GERD on PPI, 6 mm RML nodule.  Recent PFTs 05/04/2024 showed FVC 84%, FEV1 87%, FEV1/FVC 79%, TLC 80%, RV 70%, DLCO unc 82%.  No bronchodilator response.   IDDM 2, A1c 5.3 on 08/03/2024. Patient reports last dose GLP-1 agonist 07/29/2024.   Preop labs reviewed, creatinine mildly elevated 1.27, otherwise unremarkable.   EKG 05/02/2024: Normal sinus rhythm.  Rate 81. Anterolateral infarct (cited on or before 11-Apr-2024). T wave abnormality, consider inferior ischemia   Cath and PCI 11/10/2023: Successful IVUS guided PCI of severe ulcerated stenosis in the proximal LAD, reducing 70% stenosis to 0% with a 4.0 x 16 mm Synergy DES   Recommendations: Continue clopidogrel  without interruption for at least another 6 months.  Resume apixaban  tomorrow.  No concomitant aspirin  until clopidogrel  discontinued.  Anticipate hospital discharge tomorrow morning.   IVUS findings: Reference vessel diameter  4.5 mm maximum.  Lesion is calcified and ulcerated also with concentric fibrous plaquing noted.  Lesion extends back to the ostium of the LAD.   Post PCI IVUS findings: Stent well-expanded, well apposed throughout with good proximal and distal transitions.   TTE 07/26/2023: 1. Left ventricular ejection fraction, by estimation, is 50 to 55%. The  left ventricle has low normal function. The left ventricle demonstrates  regional wall motion abnormalities with basal inferolateral and basal  anterolateral akinesis. There is mild  concentric left ventricular hypertrophy. Left ventricular diastolic  parameters are consistent with Grade I diastolic dysfunction (impaired  relaxation).   2. Right ventricular systolic function is normal. The right ventricular  size is normal. Tricuspid regurgitation signal is inadequate for assessing  PA pressure.   3. The mitral valve is normal in structure. Trivial mitral valve  regurgitation. No evidence of mitral stenosis.   4. The aortic valve is tricuspid. There is mild calcification of the  aortic valve. Aortic valve regurgitation is not visualized. No aortic  stenosis is present.   5. The inferior vena cava is dilated in size with >50% respiratory  variability, suggesting right atrial pressure of 8 mmHg.    Cath and PCI 07/25/2023: Acute inferolateral MI secondary to occlusion of the distal circumflex, treated successful with primary PCI using a 2.5x24 mm Synergy DES Severe mid-circumflex stenosis, treated with a 3.5x16 mm Megatron DES (80%-->0) Moderately severe ulcerative plaque in the proximal LAD of 70% Nonobstructive RCA stenosis Mild segmental LV dysfunction with LVEF estimated at 50-55%   Recommend: post-MI  medical therapy. Consider staged IVUS +/- PCI of the proximal LAD    Lynwood Geofm RIGGERS [facility] Short Stay Center/Anesthesiology Phone [phone] 08/04/2024 3:39 PM

## 2024-08-04 NOTE — Telephone Encounter (Signed)
 Received incoming call from [place]. Pt stated after he stopped taking protonix  he was having occasional heartburn. I advised I would send rx in. Pt is going to continue taking med.  Pt is aware rx has been sent to pharmacy. Nfn

## 2024-08-04 NOTE — Anesthesia Preprocedure Evaluation (Addendum)
 Anesthesia Evaluation  Patient identified by MRN, date of birth, ID band Patient awake    Reviewed: Allergy  & Precautions, NPO status , Patient's Chart, lab work & pertinent test results, reviewed documented beta blocker date and time   History of Anesthesia Complications Negative for: history of anesthetic complications  Airway Mallampati: II  TM Distance: >3 FB     Dental no notable dental hx.    Pulmonary shortness of breath and with exertion, sleep apnea and Continuous Positive Airway Pressure Ventilation , pneumonia, neg COPD, former smoker   breath sounds clear to auscultation       Cardiovascular hypertension, (-) angina + CAD, + Past MI and + Cardiac Stents  + dysrhythmias Atrial Fibrillation  Rhythm:Regular Rate:Normal  1. Left ventricular ejection fraction, by estimation, is 50 to 55%. The  left ventricle has low normal function. The left ventricle demonstrates  regional wall motion abnormalities with basal inferolateral and basal  anterolateral akinesis. There is mild  concentric left ventricular hypertrophy. Left ventricular diastolic  parameters are consistent with Grade I diastolic dysfunction (impaired  relaxation).   2. Right ventricular systolic function is normal. The right ventricular  size is normal. Tricuspid regurgitation signal is inadequate for assessing  PA pressure.   3. The mitral valve is normal in structure. Trivial mitral valve  regurgitation. No evidence of mitral stenosis.   4. The aortic valve is tricuspid. There is mild calcification of the  aortic valve. Aortic valve regurgitation is not visualized. No aortic  stenosis is present.   5. The inferior vena cava is dilated in size with >50% respiratory  variability, suggesting right atrial pressure of 8 mmHg.     Neuro/Psych  Headaches, neg Seizures PSYCHIATRIC DISORDERS Anxiety Depression     Neuromuscular disease    GI/Hepatic ,GERD  ,,(+)  neg Cirrhosis        Endo/Other  diabetes, Type 2    Renal/GU Renal disease     Musculoskeletal  (+) Arthritis ,    Abdominal   Peds  Hematology   Anesthesia Other Findings   Reproductive/Obstetrics                              Anesthesia Physical Anesthesia Plan  ASA: 3  Anesthesia Plan: General   Post-op Pain Management:    Induction: Intravenous  PONV Risk Score and Plan: 2 and Ondansetron  and Dexamethasone   Airway Management Planned: Oral ETT  Additional Equipment:   Intra-op Plan:   Post-operative Plan: Extubation in OR  Informed Consent: I have reviewed the patients History and Physical, chart, labs and discussed the procedure including the risks, benefits and alternatives for the proposed anesthesia with the patient or authorized representative who has indicated his/her understanding and acceptance.     Dental advisory given  Plan Discussed with: CRNA  Anesthesia Plan Comments: (PAT note by Lynwood Hope, PA-C:  63 year old male follows with cardiology for history of PAF on Eliquis  s/p ablation 02/01/2024, HTN, CAD s/p STEMI with DES to LCx (06/2023) and LAD (10/2023), OSA intolerant to CPAP.  Seen by Jodie Passey on 05/02/2024 and cleared for ENT surgery.  Per note, Cardiac Clearance for Inspire Implant: No concerns from a cardiac perspective.  The patient is at low risk to proceed without further work up. If the patient has new chest pain or SOB prior to surgery, they should be revaluated. CANNOT hold Plavix  until 5/13, if it needs to be held longer than  1 day, surgery will need to be moved back from 5/14. OK to hold eliquis  x 48 hours, then should resume as soon as safe to do so.  Patient subsequently seen by EP cardiologist Dr. Kennyth on 05/26/2024 for discussion of watchman/LAAO.  He is scheduled for this procedure on 09/14/2024.  Cardiology is aware of inspire implant and has coordinated timing with patient to allow for inspire  implant to be done 08/09/2024.   Follows with pulmonology for history of GERD on PPI, 6 mm RML nodule.  Recent PFTs 05/04/2024 showed FVC 84%, FEV1 87%, FEV1/FVC 79%, TLC 80%, RV 70%, DLCO unc 82%.  No bronchodilator response.   IDDM 2, A1c 5.3 on 08/03/2024. Patient reports last dose GLP-1 agonist 07/29/2024.   Preop labs reviewed, creatinine mildly elevated 1.27, otherwise unremarkable.   EKG 05/02/2024: Normal sinus rhythm.  Rate 81. Anterolateral infarct (cited on or before 11-Apr-2024). T wave abnormality, consider inferior ischemia   Cath and PCI 11/10/2023: Successful IVUS guided PCI of severe ulcerated stenosis in the proximal LAD, reducing 70% stenosis to 0% with a 4.0 x 16 mm Synergy DES   Recommendations: Continue clopidogrel  without interruption for at least another 6 months.  Resume apixaban  tomorrow.  No concomitant aspirin  until clopidogrel  discontinued.  Anticipate hospital discharge tomorrow morning.   IVUS findings: Reference vessel diameter 4.5 mm maximum.  Lesion is calcified and ulcerated also with concentric fibrous plaquing noted.  Lesion extends back to the ostium of the LAD.   Post PCI IVUS findings: Stent well-expanded, well apposed throughout with good proximal and distal transitions.   TTE 07/26/2023: 1. Left ventricular ejection fraction, by estimation, is 50 to 55%. The  left ventricle has low normal function. The left ventricle demonstrates  regional wall motion abnormalities with basal inferolateral and basal  anterolateral akinesis. There is mild  concentric left ventricular hypertrophy. Left ventricular diastolic  parameters are consistent with Grade I diastolic dysfunction (impaired  relaxation).   2. Right ventricular systolic function is normal. The right ventricular  size is normal. Tricuspid regurgitation signal is inadequate for assessing  PA pressure.   3. The mitral valve is normal in structure. Trivial mitral valve  regurgitation. No evidence of mitral  stenosis.   4. The aortic valve is tricuspid. There is mild calcification of the  aortic valve. Aortic valve regurgitation is not visualized. No aortic  stenosis is present.   5. The inferior vena cava is dilated in size with >50% respiratory  variability, suggesting right atrial pressure of 8 mmHg.    Cath and PCI 07/25/2023: 1. Acute inferolateral MI secondary to occlusion of the distal circumflex, treated successful with primary PCI using a 2.5x24 mm Synergy DES 2. Severe mid-circumflex stenosis, treated with a 3.5x16 mm Megatron DES (80%-->0) 3. Moderately severe ulcerative plaque in the proximal LAD of 70% 4. Nonobstructive RCA stenosis 5. Mild segmental LV dysfunction with LVEF estimated at 50-55%   Recommend: post-MI medical therapy. Consider staged IVUS +/- PCI of the proximal LAD  )         Anesthesia Quick Evaluation

## 2024-08-08 MED ORDER — SODIUM CHLORIDE 0.9 % IV SOLN
3.0000 g | INTRAVENOUS | Status: AC
  Administered 2024-08-09 (×2): 3 g via INTRAVENOUS
  Filled 2024-08-08 (×2): qty 8

## 2024-08-09 ENCOUNTER — Encounter (HOSPITAL_COMMUNITY): Payer: Self-pay

## 2024-08-09 ENCOUNTER — Ambulatory Visit (HOSPITAL_BASED_OUTPATIENT_CLINIC_OR_DEPARTMENT_OTHER): Payer: Self-pay | Admitting: Anesthesiology

## 2024-08-09 ENCOUNTER — Ambulatory Visit (HOSPITAL_COMMUNITY): Payer: Self-pay | Admitting: Vascular Surgery

## 2024-08-09 ENCOUNTER — Other Ambulatory Visit: Payer: Self-pay

## 2024-08-09 ENCOUNTER — Ambulatory Visit (HOSPITAL_COMMUNITY)
Admission: RE | Admit: 2024-08-09 | Discharge: 2024-08-09 | Disposition: A | Discharge status: Home / Self Care | Attending: Otolaryngology | Admitting: Otolaryngology

## 2024-08-09 ENCOUNTER — Ambulatory Visit (HOSPITAL_COMMUNITY)

## 2024-08-09 ENCOUNTER — Encounter (HOSPITAL_COMMUNITY)
Admission: RE | Disposition: A | Discharge status: Home / Self Care | Payer: Self-pay | Source: Home / Self Care | Attending: Otolaryngology

## 2024-08-09 DIAGNOSIS — I2510 Atherosclerotic heart disease of native coronary artery without angina pectoris: Secondary | ICD-10-CM | POA: Diagnosis not present

## 2024-08-09 DIAGNOSIS — Z6828 Body mass index (BMI) 28.0-28.9, adult: Secondary | ICD-10-CM

## 2024-08-09 DIAGNOSIS — F419 Anxiety disorder, unspecified: Secondary | ICD-10-CM | POA: Diagnosis not present

## 2024-08-09 DIAGNOSIS — I252 Old myocardial infarction: Secondary | ICD-10-CM | POA: Insufficient documentation

## 2024-08-09 DIAGNOSIS — Z87891 Personal history of nicotine dependence: Secondary | ICD-10-CM | POA: Diagnosis not present

## 2024-08-09 DIAGNOSIS — Z7901 Long term (current) use of anticoagulants: Secondary | ICD-10-CM | POA: Insufficient documentation

## 2024-08-09 DIAGNOSIS — I4891 Unspecified atrial fibrillation: Secondary | ICD-10-CM | POA: Insufficient documentation

## 2024-08-09 DIAGNOSIS — J342 Deviated nasal septum: Secondary | ICD-10-CM | POA: Insufficient documentation

## 2024-08-09 DIAGNOSIS — Z91198 Patient's noncompliance with other medical treatment and regimen for other reason: Secondary | ICD-10-CM

## 2024-08-09 DIAGNOSIS — Z955 Presence of coronary angioplasty implant and graft: Secondary | ICD-10-CM | POA: Diagnosis not present

## 2024-08-09 DIAGNOSIS — G4733 Obstructive sleep apnea (adult) (pediatric): Secondary | ICD-10-CM | POA: Diagnosis present

## 2024-08-09 DIAGNOSIS — I10 Essential (primary) hypertension: Secondary | ICD-10-CM | POA: Diagnosis not present

## 2024-08-09 DIAGNOSIS — F32A Depression, unspecified: Secondary | ICD-10-CM | POA: Diagnosis not present

## 2024-08-09 DIAGNOSIS — E119 Type 2 diabetes mellitus without complications: Secondary | ICD-10-CM | POA: Diagnosis not present

## 2024-08-09 DIAGNOSIS — Z794 Long term (current) use of insulin: Secondary | ICD-10-CM | POA: Diagnosis not present

## 2024-08-09 DIAGNOSIS — J343 Hypertrophy of nasal turbinates: Secondary | ICD-10-CM | POA: Diagnosis not present

## 2024-08-09 HISTORY — PX: IMPLANTATION OF HYPOGLOSSAL NERVE STIMULATOR: SHX6827

## 2024-08-09 LAB — GLUCOSE, CAPILLARY
Glucose-Capillary: 169 mg/dL — ABNORMAL HIGH (ref 70–99)
Glucose-Capillary: 109 mg/dL — ABNORMAL HIGH (ref 70–99)

## 2024-08-09 SURGERY — INSERTION, HYPOGLOSSAL NERVE STIMULATOR
Anesthesia: General | Site: Neck

## 2024-08-09 MED ORDER — LIDOCAINE 2% (20 MG/ML) 5 ML SYRINGE
INTRAMUSCULAR | Status: AC
  Filled 2024-08-09: qty 5

## 2024-08-09 MED ORDER — FENTANYL CITRATE (PF) 100 MCG/2ML IJ SOLN
25.0000 ug | INTRAMUSCULAR | Status: DC | PRN
  Administered 2024-08-09 (×2): 25 ug via INTRAVENOUS
  Administered 2024-08-09: 50 ug via INTRAVENOUS
  Administered 2024-08-09: 25 ug via INTRAVENOUS
  Administered 2024-08-09: 50 ug via INTRAVENOUS
  Administered 2024-08-09: 25 ug via INTRAVENOUS

## 2024-08-09 MED ORDER — INSULIN ASPART 100 UNIT/ML IJ SOLN
0.0000 [IU] | INTRAMUSCULAR | Status: DC | PRN
  Administered 2024-08-09 (×2): 2 [IU] via SUBCUTANEOUS
  Filled 2024-08-09: qty 1

## 2024-08-09 MED ORDER — LIDOCAINE 2% (20 MG/ML) 5 ML SYRINGE
INTRAMUSCULAR | Status: DC | PRN
  Administered 2024-08-09 (×2): 100 mg via INTRAVENOUS

## 2024-08-09 MED ORDER — MIDAZOLAM HCL 2 MG/2ML IJ SOLN
INTRAMUSCULAR | Status: DC | PRN
  Administered 2024-08-09 (×4): 1 mg via INTRAVENOUS

## 2024-08-09 MED ORDER — EPHEDRINE SULFATE-NACL 50-0.9 MG/10ML-% IV SOSY
PREFILLED_SYRINGE | INTRAVENOUS | Status: DC | PRN
  Administered 2024-08-09 (×2): 10 mg via INTRAVENOUS
  Administered 2024-08-09: 15 mg via INTRAVENOUS
  Administered 2024-08-09 (×3): 10 mg via INTRAVENOUS
  Administered 2024-08-09: 15 mg via INTRAVENOUS
  Administered 2024-08-09: 10 mg via INTRAVENOUS

## 2024-08-09 MED ORDER — PROPOFOL 10 MG/ML IV BOLUS
INTRAVENOUS | Status: DC | PRN
  Administered 2024-08-09 (×2): 140 mg via INTRAVENOUS

## 2024-08-09 MED ORDER — OXYCODONE HCL 5 MG/5ML PO SOLN: 5.0000 mg | Freq: Once | ORAL | Status: AC | PRN

## 2024-08-09 MED ORDER — SUCCINYLCHOLINE CHLORIDE 200 MG/10ML IV SOSY
PREFILLED_SYRINGE | INTRAVENOUS | Status: DC | PRN
  Administered 2024-08-09 (×2): 120 mg via INTRAVENOUS

## 2024-08-09 MED ORDER — OXYCODONE HCL 5 MG PO TABS: 5.0000 mg | ORAL_TABLET | Freq: Four times a day (QID) | ORAL | 0 refills | Status: DC | PRN

## 2024-08-09 MED ORDER — DEXAMETHASONE SODIUM PHOSPHATE 10 MG/ML IJ SOLN
INTRAMUSCULAR | Status: DC | PRN
  Administered 2024-08-09 (×2): 4 mg via INTRAVENOUS

## 2024-08-09 MED ORDER — SODIUM CHLORIDE 0.9 % IV SOLN
0.1500 ug/kg/min | Freq: Once | INTRAVENOUS | Status: AC
  Administered 2024-08-09 (×2): .15 ug/kg/min via INTRAVENOUS
  Filled 2024-08-09: qty 2000

## 2024-08-09 MED ORDER — LIDOCAINE-EPINEPHRINE 1 %-1:100000 IJ SOLN
INTRAMUSCULAR | Status: DC | PRN
Start: 2024-08-09 — End: 2024-08-09
  Administered 2024-08-09 (×2): 6 mL

## 2024-08-09 MED ORDER — FENTANYL CITRATE (PF) 250 MCG/5ML IJ SOLN
INTRAMUSCULAR | Status: AC
Start: 2024-08-09 — End: 2024-08-09
  Filled 2024-08-09: qty 5

## 2024-08-09 MED ORDER — ACETAMINOPHEN 500 MG PO TABS: 500.0000 mg | ORAL_TABLET | Freq: Four times a day (QID) | ORAL | 0 refills | Status: AC

## 2024-08-09 MED ORDER — OXYCODONE HCL 5 MG PO TABS
ORAL_TABLET | ORAL | Status: AC
  Filled 2024-08-09: qty 1

## 2024-08-09 MED ORDER — PHENYLEPHRINE HCL-NACL 20-0.9 MG/250ML-% IV SOLN
INTRAVENOUS | Status: DC | PRN
  Administered 2024-08-09 (×2): 40 ug/min via INTRAVENOUS
  Administered 2024-08-09 (×2): 60 ug/min via INTRAVENOUS

## 2024-08-09 MED ORDER — FENTANYL CITRATE (PF) 100 MCG/2ML IJ SOLN
INTRAMUSCULAR | Status: AC
  Filled 2024-08-09: qty 2

## 2024-08-09 MED ORDER — ONDANSETRON HCL 4 MG/2ML IJ SOLN: 4.0000 mg | Freq: Once | INTRAMUSCULAR | Status: DC | PRN

## 2024-08-09 MED ORDER — IBUPROFEN 600 MG PO TABS: 600.0000 mg | ORAL_TABLET | Freq: Four times a day (QID) | ORAL | 0 refills | Status: DC

## 2024-08-09 MED ORDER — FENTANYL CITRATE (PF) 250 MCG/5ML IJ SOLN
INTRAMUSCULAR | Status: DC | PRN
  Administered 2024-08-09 (×2): 100 ug via INTRAVENOUS

## 2024-08-09 MED ORDER — LACTATED RINGERS IV SOLN: INTRAVENOUS | Status: DC

## 2024-08-09 MED ORDER — OXYCODONE HCL 5 MG PO TABS
5.0000 mg | ORAL_TABLET | Freq: Once | ORAL | Status: AC | PRN
  Administered 2024-08-09 (×2): 5 mg via ORAL

## 2024-08-09 MED ORDER — ACETAMINOPHEN 10 MG/ML IV SOLN: 1000.0000 mg | Freq: Once | INTRAVENOUS | Status: DC | PRN

## 2024-08-09 MED ORDER — ONDANSETRON HCL 4 MG/2ML IJ SOLN
INTRAMUSCULAR | Status: DC | PRN
  Administered 2024-08-09 (×2): 4 mg via INTRAVENOUS

## 2024-08-09 MED ORDER — LIDOCAINE-EPINEPHRINE 1 %-1:100000 IJ SOLN
INTRAMUSCULAR | Status: AC
  Filled 2024-08-09: qty 1

## 2024-08-09 MED ORDER — MIDAZOLAM HCL 2 MG/2ML IJ SOLN
INTRAMUSCULAR | Status: AC
  Filled 2024-08-09: qty 2

## 2024-08-09 MED ORDER — CHLORHEXIDINE GLUCONATE 0.12 % MT SOLN
15.0000 mL | Freq: Once | OROMUCOSAL | Status: AC
  Administered 2024-08-09 (×2): 15 mL via OROMUCOSAL
  Filled 2024-08-09: qty 15

## 2024-08-09 MED ORDER — 0.9 % SODIUM CHLORIDE (POUR BTL) OPTIME
TOPICAL | Status: DC | PRN
  Administered 2024-08-09 (×2): 1000 mL

## 2024-08-09 MED ORDER — PROPOFOL 10 MG/ML IV BOLUS
INTRAVENOUS | Status: AC
  Filled 2024-08-09: qty 20

## 2024-08-09 MED ORDER — AMOXICILLIN-POT CLAVULANATE 875-125 MG PO TABS: 1.0000 | ORAL_TABLET | Freq: Two times a day (BID) | ORAL | 0 refills | Status: DC

## 2024-08-09 MED ORDER — SUCCINYLCHOLINE CHLORIDE 200 MG/10ML IV SOSY
PREFILLED_SYRINGE | INTRAVENOUS | Status: AC
  Filled 2024-08-09: qty 10

## 2024-08-09 MED ORDER — ORAL CARE MOUTH RINSE: 15.0000 mL | Freq: Once | OROMUCOSAL | Status: AC

## 2024-08-09 SURGICAL SUPPLY — 57 items
BLADE SURG 15 STRL LF DISP TIS (BLADE) ×6 IMPLANT
CANISTER SUCTION 3000ML PPV (SUCTIONS) ×2 IMPLANT
CORD BIPOLAR FORCEPS 12FT (ELECTRODE) ×2 IMPLANT
COVER PROBE W GEL 5X96 (DRAPES) ×2 IMPLANT
COVER SURGICAL LIGHT HANDLE (MISCELLANEOUS) ×2 IMPLANT
DERMABOND ADVANCED .7 DNX12 (GAUZE/BANDAGES/DRESSINGS) ×4 IMPLANT
DRAPE C-ARM 35X43 STRL (DRAPES) ×2 IMPLANT
DRAPE INCISE IOBAN 66X45 STRL (DRAPES) ×2 IMPLANT
DRAPE MICROSCOPE LEICA 54X105 (DRAPES) ×2 IMPLANT
DRSG TEGADERM 4X4.75 (GAUZE/BANDAGES/DRESSINGS) ×6 IMPLANT
ELECT COATED BLADE 2.86 ST (ELECTRODE) ×2 IMPLANT
ELECTRODE 4 CHANNEL SET (MISCELLANEOUS) IMPLANT
ELECTRODE EMG 18 NIMS (NEUROSURGERY SUPPLIES) ×2 IMPLANT
ELECTRODE REM PT RTRN 9FT ADLT (ELECTROSURGICAL) ×2 IMPLANT
FORCEPS BIPOLAR SPETZLER 8 1.0 (NEUROSURGERY SUPPLIES) IMPLANT
GAUZE 4X4 16PLY ~~LOC~~+RFID DBL (SPONGE) ×2 IMPLANT
GAUZE SPONGE 4X4 12PLY STRL (GAUZE/BANDAGES/DRESSINGS) ×2 IMPLANT
GENERATOR PULSE INSPIRE (Generator) ×1 IMPLANT
GENERATOR PULSE INSPIRE IV (Generator) ×2 IMPLANT
GLOVE BIO SURGEON STRL SZ 6 (GLOVE) ×2 IMPLANT
GOWN STRL REUS W/ TWL LRG LVL3 (GOWN DISPOSABLE) ×6 IMPLANT
KIT BASIN OR (CUSTOM PROCEDURE TRAY) ×2 IMPLANT
KIT NEURO ACCESSORY W/WRENCH (MISCELLANEOUS) IMPLANT
KIT TURNOVER KIT B (KITS) ×2 IMPLANT
LEAD SENSING RESP INSPIRE (Lead) ×1 IMPLANT
LEAD SENSING RESP INSPIRE IV (Lead) ×2 IMPLANT
LEAD SLEEP STIM INSPIRE IV/V (Lead) ×2 IMPLANT
LEAD SLEEP STIMULATION INSPIRE (Lead) ×1 IMPLANT
LOOP VASCLR MAXI BLUE 18IN ST (MISCELLANEOUS) ×2 IMPLANT
LOOPS VASCLR MAXI BLUE 18IN ST (MISCELLANEOUS) ×1 IMPLANT
MARKER SKIN DUAL TIP RULER LAB (MISCELLANEOUS) ×4 IMPLANT
NDL HYPO 25GX1X1/2 BEV (NEEDLE) ×2 IMPLANT
NEEDLE HYPO 25GX1X1/2 BEV (NEEDLE) ×1 IMPLANT
NS IRRIG 1000ML POUR BTL (IV SOLUTION) ×2 IMPLANT
PAD ARMBOARD POSITIONER FOAM (MISCELLANEOUS) ×2 IMPLANT
PASSER CATH 38CM DISP (INSTRUMENTS) ×2 IMPLANT
PENCIL BUTTON HOLSTER BLD 10FT (ELECTRODE) ×2 IMPLANT
POSITIONER HEAD DONUT 9IN (MISCELLANEOUS) ×2 IMPLANT
PROBE NERVE STIMULATOR (NEUROSURGERY SUPPLIES) ×2 IMPLANT
REMOTE CONTROL SLEEP INSPIRE (MISCELLANEOUS) ×2 IMPLANT
RETRACTOR STAY HOOK 5MM (MISCELLANEOUS) ×2 IMPLANT
SET WALTER ACTIVATION W/DRAPE (SET/KITS/TRAYS/PACK) IMPLANT
SHEARS HARMONIC 9CM CVD (BLADE) ×2 IMPLANT
SPONGE INTESTINAL PEANUT (DISPOSABLE) ×6 IMPLANT
SUT MNCRL AB 4-0 PS2 18 (SUTURE) ×4 IMPLANT
SUT SILK 2 0 SH CR/8 (SUTURE) IMPLANT
SUT SILK 2 0 TIES 10X30 (SUTURE) IMPLANT
SUT SILK 3 0 TIES 10X30 (SUTURE) IMPLANT
SUT SILK 3 0SH CR/8 30 (SUTURE) IMPLANT
SUT VIC AB 3-0 SH 27X BRD (SUTURE) ×4 IMPLANT
SUTURE SILK 3-0 RB1 30XBRD (SUTURE) ×4 IMPLANT
SUTURE SILK PEM 2-0 1X30 RB-1 (SUTURE) ×4 IMPLANT
SYR 10ML LL (SYRINGE) ×2 IMPLANT
TAPE CLOTH SURG 4X10 WHT LF (GAUZE/BANDAGES/DRESSINGS) ×2 IMPLANT
TOWEL GREEN STERILE (TOWEL DISPOSABLE) ×2 IMPLANT
TRAY ENT MC OR (CUSTOM PROCEDURE TRAY) ×2 IMPLANT
VASCULAR TIE MINI RED 18IN STL (MISCELLANEOUS) ×2 IMPLANT

## 2024-08-09 NOTE — Anesthesia Postprocedure Evaluation (Signed)
 Anesthesia Post Note  Patient: [name] [name]  Procedure(s) Performed: INSERTION, HYPOGLOSSAL NERVE STIMULATOR (Neck)     Patient location during evaluation: PACU Anesthesia Type: General Level of consciousness: awake and alert Pain management: pain level controlled Vital Signs Assessment: post-procedure vital signs reviewed and stable Respiratory status: spontaneous breathing, nonlabored ventilation, respiratory function stable and patient connected to nasal cannula oxygen Cardiovascular status: blood pressure returned to baseline and stable Postop Assessment: no apparent nausea or vomiting Anesthetic complications: no   No notable events documented.  Last Vitals:  Vitals:   08/09/24 1145 08/09/24 1200  BP: 114/61 128/72  Pulse: 87 88  Resp: 18 19  Temp:  36.8 C  SpO2: 99% 93%    Last Pain:  Vitals:   08/09/24 1200  TempSrc:   PainSc: 4                  [name]

## 2024-08-09 NOTE — Discharge Instructions (Signed)
INSPIRE POST-OPERATIVE INSTRUCTIONS:   Please review post-operative and recovery instructions below that you will need to be aware of after Inspire Implant surgery.  Please restart all of your home medications if you take anything on a daily basis.  You can resume regular diet after this procedure.  You will be scheduled for an appointment with Dr. Irene Pap to review details about surgery and to discuss the next steps.   DIET: Resume normal diet HYGIENE: Please wait until 48 hours after surgery before getting incisions on neck, chest, and torso wet. In the first 48 hours after surgery, will likely need to take sponge baths. WOUND CARE: Please leave pressure dressing on for 48 hours after surgery. Gently place antibiotic ointment over incisions 2 times per day; use clean q-tip. May place a clean bandage over incisions as needed. After 48 hours, you may get incisions wet with warm soap and water, but do not soak the incisions.  Pat area dry gently.  Immediately place antibiotic ointment. Take oral antibiotics as prescribed If skin around incision starts to get red (> 1cm), swollen, and/or more painful, please call the office ACTIVITY: Try to avoid sleeping on the side of your surgery, to the extent possible.   You may walk for exercise starting the day after surgery. For 2 weeks: Do not pick up anything greater than 5 pounds with the hand/arm that's on the same side as the surgery.  After 2 weeks, you may increase weight to 10 pounds.   Consider performing neck rolls 10 clockwise and 10 counterclockwise 3x/day. For 4 weeks, no strenuous activity (running, jogging, lifting weights, gardening, sports) or until cleared by physician.   PAIN MEDICATIONS: You will be prescribed Oxycodone for pain.   If pain is not severe, consider taking Tylenol 650mg  every 6 hours Avoid aspirin for 7 days after surgery Avoid direct heat (such as heating pads) to incision sites.   May gently place ice over  surgery sites as needed.  Please place a thin clean towel over skin first and then place ice bag over towel.  Ice for 10 minutes at a time only.  POST-OPERATIVE CLINIC APPOINTMENTS: 1 week: suture removal and wound check in the office.  1 month: device activation and wound check in the office. 2.5 months: check in visit to assess usage. 3-4 months: titration sleep study based on usage of >4 hrs/night.  4 months: final wound check in the office.  Yearly: device check at office.  SCAR CARE: After incisions have healed, you will have a scar, which will continue to evolve over the course of 12 months.  Caring for your incision scars will help them to be as minimal as possible. If you are out in the sun with incision exposure, please remember to place sunscreen over the incision and surrounding skin.   You may use vitamin E or "Scar ointment/cream" to help soften scar.  Please wait one month after surgery before starting this.

## 2024-08-09 NOTE — Transfer of Care (Signed)
 Immediate Anesthesia Transfer of Care Note  Patient: [name] [name]  Procedure(s) Performed: INSERTION, HYPOGLOSSAL NERVE STIMULATOR (Neck)  Patient Location: PACU  Anesthesia Type:General  Level of Consciousness: drowsy  Airway & Oxygen Therapy: Patient Spontanous Breathing and Patient connected to face mask oxygen  Post-op Assessment: Report given to RN and Post -op Vital signs reviewed and stable  Post vital signs: Reviewed and stable  Last Vitals:  Vitals Value Taken Time  BP 127/74 08/09/24 11:07  Temp 97.9   Pulse 82 08/09/24 11:09  Resp 12 08/09/24 11:09  SpO2 93 % 08/09/24 11:09  Vitals shown include unfiled device data.  Last Pain:  Vitals:   08/09/24 0744  TempSrc:   PainSc: 0-No pain      Patients Stated Pain Goal: 0 (08/09/24 0744)  Complications: No notable events documented.

## 2024-08-09 NOTE — Op Note (Addendum)
 Operative Report                                                            SURGEON: Elena Larry, MD  ASSISTANT: Keven Piety, RNFA  PREOPERATIVE DIAGNOSIS: Obstructive sleep apnea. BMI 28  POSTOPERATIVE DIAGNOSIS: Obstructive sleep apnea. BMI 28  ANESTHESIA: General endotracheal.  ESTIMATED BLOOD LOSS: Less than 15 ml.  SPECIMENS: None.  DRAINS: None.  COMPLICATIONS: None.  Procedure: 12th cranial nerve (hypoglossal) stimulation implant along with placement of right pleural respiration sensor.  Electronic analysis of the implanted neurostimulator pulse generator system  Pre-Op Diagnosis: Moderate /Severe obstructive sleep apnea with positive airway pressure intolerance (ICD-10 G47.33). Post-Op Diagnosis: Moderate /Severe obstructive sleep apnea with positive pressure airway intolerance (ICD-10 G47.33).   Anesthesia: General endotracheal  Complications: None Brief Clinical History:     This is a **-year-old patient with a history of moderate /severe obstructive sleep apnea, who is intolerant and unable to achieve benefit from positive pressure therapy. Patient has passed the clinical, polysomnographic, and endoscopic screening criteria and presents today for the implant.    Procedure Description: The patient was brought to the Operating Room and was anesthetized via general endotracheal anesthesia without complication.  A shoulder roll was placed and the patient was prepped and draped in usual sterile fashion with the head turned to the left. Prior to prepping and draping, electrodes were placed in the genioglossus and styloglossus muscle and connected to the NIM box for intraoperative nerve monitoring.  A submental incision was made in the right upper neck approximately 2 cm below the mandible in the natural skin crease. Dissection was carried down through the subcutaneous tissue and platysma. The inferior border of the submandibular gland was identified as well as  the digastric tendon. The submandibular gland and the overlying fascia with the marginal mandibular nerve were retracted superiorly.  The digastric was retracted inferiorly.  Dissection was carried down into the digastric triangle where the hypoglossal nerve was identified in its usual fashion.   The mylohyoid muscle was retracted anteriorally, and the hypoglossal nerve was dissected up towards the floor of the mouth.  The lateral branches to retrusor muscles were identified, and tested intra-operatively using the NIM stimulator.  The cuff electrode for the hypoglossal nerve stimulator was placed distally to these branches on the medial nerve branch to the genioglossus muscle. Diagnostic evaluation confirmed activation of the genioglossus nerve, resulting in genioglossal activation and tongue protrusion, confirmed both visually and on NIM monitor. The stimulation electrode was then secured to the digastric tendon on its lateral surface with the provided anchor.  A second 5 cm incision was made in the right upper chest approximately 3 cm below the clavicle.  Dissection was carried down to the pectoralis muscle. An inferior pocket was created deep to the subcutaneous layer and superficial to the pectoralis major muscle.  In the upper portion of our chest pocket, pectoralis muscle fibers were then divided until we encountered anterior chest wall. External intercostal muscle was visualized and dissected until internal intercostal was visualized. Our respiratory sense lead was then tunneled in the fascial plane between external and internal intercostals. The distal anchor was secured to the anterior chest wall using 3-2.0 silk sutures and the proximal anchor was secured to the pec major facscia.  The stimulation  lead was then tunneled in a subplatysmal plane and brought out into the sub-clavicular pocket.  Pulse generator was then brought onto the field and the sense and stimulation pins were both connected to the  appropriate headers and secured using a torque screwdriver. The implantable pulse generator was placed in the subclavicular pocket and secured loosely to the pectoralis fascia using non-resorbable sutures.    Diagnostic evaluation of the system was run, confirming good respiratory wave from the sensing lead, good anterior tongue protrusion with stimulation, and normal impedence measurements.  All the wounds were thoroughly irrigated with bacitracin irrigation.  The wounds were then closed in multiple layers with deep Vicryl sutures and a 5-0 biosyn.  Dermabond was then applied to the skin.    The patient was then awakened, extubated, and transferred to Recovery Room in stable condition. All counts correct x2.  I was present for and performed the entire procedure.  The RNFA assisted throughout the case. The RNFA was essential in retraction and counter traction when needed to make the case progress smoothly. This retraction and assistance made it possible to see the tissue planes for the procedure. The assistance was needed for blood control, tissue re-approximation and assisted with closure of the incision site

## 2024-08-09 NOTE — Interval H&P Note (Signed)
 History and Physical Interval Note:  08/09/2024 9:08 AM  [name] [name]  has presented today for surgery, with the diagnosis of Obstructive sleep apnea.  The various methods of treatment have been discussed with the patient and family. After consideration of risks, benefits and other options for treatment, the patient has consented to  Procedure(s): INSERTION, HYPOGLOSSAL NERVE STIMULATOR (N/A) as a surgical intervention.  The patient's history has been reviewed, patient examined, no change in status, stable for surgery.  I have reviewed the patient's chart and labs.  Questions were answered to the patient's satisfaction.     [name]

## 2024-08-09 NOTE — Anesthesia Procedure Notes (Signed)
 Procedure Name: Intubation Date/Time: 08/09/2024 9:40 AM  Performed by: Atanacio Arland HERO, CRNAPre-anesthesia Checklist: Patient identified, Emergency Drugs available, Suction available and Patient being monitored Patient Re-evaluated:Patient Re-evaluated prior to induction Oxygen Delivery Method: Circle System Utilized Preoxygenation: Pre-oxygenation with 100% oxygen Induction Type: IV induction Ventilation: Mask ventilation without difficulty Laryngoscope Size: Mac and 4 Grade View: Grade II Tube type: Oral Tube size: 7.5 mm Number of attempts: 1 Airway Equipment and Method: Stylet Placement Confirmation: ETT inserted through vocal cords under direct vision, positive ETCO2 and breath sounds checked- equal and bilateral Secured at: 23 cm Tube secured with: Tape Dental Injury: Teeth and Oropharynx as per pre-operative assessment

## 2024-08-10 ENCOUNTER — Encounter (HOSPITAL_COMMUNITY): Payer: Self-pay | Admitting: Otolaryngology

## 2024-08-11 ENCOUNTER — Ambulatory Visit (INDEPENDENT_AMBULATORY_CARE_PROVIDER_SITE_OTHER): Admitting: Otolaryngology

## 2024-08-11 VITALS — BP 107/65 | HR 81

## 2024-08-11 DIAGNOSIS — Z9889 Other specified postprocedural states: Secondary | ICD-10-CM

## 2024-08-11 DIAGNOSIS — G4733 Obstructive sleep apnea (adult) (pediatric): Secondary | ICD-10-CM

## 2024-08-11 MED ORDER — METHYLPREDNISOLONE 4 MG PO TBPK
ORAL_TABLET | ORAL | 1 refills | Status: DC
Start: 2024-08-11 — End: 2024-09-01

## 2024-08-11 NOTE — Progress Notes (Signed)
 ENT Post-Op visit    He noted swelling along the right chin and some bruising last night. Taking Motrin  and Oxy for pain.   PE: Submental area with mild ecchymosis and fullness swelling that appears firm, no blood aspirated with needle and syringe Floor of mouth appears normal Tongue mobility intact  A/P He could have had a small hematoma, that is currently in a clot stage  - continue abx - Medrol  pack to help with resolution  - RTC for wound check Monday

## 2024-08-14 ENCOUNTER — Encounter (INDEPENDENT_AMBULATORY_CARE_PROVIDER_SITE_OTHER): Payer: Self-pay | Admitting: Otolaryngology

## 2024-08-14 ENCOUNTER — Ambulatory Visit (INDEPENDENT_AMBULATORY_CARE_PROVIDER_SITE_OTHER): Admitting: Otolaryngology

## 2024-08-14 ENCOUNTER — Encounter (INDEPENDENT_AMBULATORY_CARE_PROVIDER_SITE_OTHER): Admitting: Otolaryngology

## 2024-08-14 ENCOUNTER — Encounter (INDEPENDENT_AMBULATORY_CARE_PROVIDER_SITE_OTHER): Payer: Self-pay

## 2024-08-14 VITALS — BP 127/81 | HR 79

## 2024-08-14 DIAGNOSIS — Z9889 Other specified postprocedural states: Secondary | ICD-10-CM

## 2024-08-14 DIAGNOSIS — G4733 Obstructive sleep apnea (adult) (pediatric): Secondary | ICD-10-CM

## 2024-08-14 MED ORDER — OXYCODONE HCL 5 MG PO TABS: 5.0000 mg | ORAL_TABLET | Freq: Four times a day (QID) | ORAL | 0 refills | Status: DC | PRN

## 2024-08-14 MED ORDER — PREDNISONE 10 MG PO TABS: 10.0000 mg | ORAL_TABLET | Freq: Every day | ORAL | 0 refills | Status: DC

## 2024-08-14 MED ORDER — AMOXICILLIN-POT CLAVULANATE 875-125 MG PO TABS: 1.0000 | ORAL_TABLET | Freq: Two times a day (BID) | ORAL | 0 refills | Status: DC

## 2024-08-14 NOTE — Progress Notes (Signed)
 ENT Post-Op visit    Swelling is better, and pain decreased, but he had more ecchymosis. No dyspnea or trouble with swalloing  PE: Submental area with ecchymosis and fullness Floor of mouth appears normal Tongue mobility intact  A/P Hematoma, improving  - continue abx - continue steroids another rx sent  - arnica for bruising  - RTC for wound check next week

## 2024-08-17 ENCOUNTER — Telehealth: Payer: Self-pay | Admitting: Cardiology

## 2024-08-17 ENCOUNTER — Telehealth: Payer: Self-pay | Admitting: Cardiovascular Disease

## 2024-08-17 DIAGNOSIS — I480 Paroxysmal atrial fibrillation: Secondary | ICD-10-CM

## 2024-08-17 MED ORDER — APIXABAN 5 MG PO TABS: 5.0000 mg | ORAL_TABLET | Freq: Two times a day (BID) | ORAL | 1 refills | Status: DC

## 2024-08-17 NOTE — Telephone Encounter (Signed)
 Left message for the patient that his procedure is at 0730 with a 0530 arrival time (his instruction letter is in the chart already). Reiterated he will get his instruction letter at his 08/29/2024 pre-procedure visit and instructed him to call if he has questions prior to that visit.

## 2024-08-17 NOTE — Telephone Encounter (Signed)
 Pt is requesting a callback regarding his upcoming procedure he's having. Please advise

## 2024-08-17 NOTE — Telephone Encounter (Signed)
 Spoke with pt, he is needing the time he is scheduled for the procedure so he can get his transportation straightened out. Aware will forward to the EP scheduler.

## 2024-08-17 NOTE — Telephone Encounter (Signed)
 Prescription re fill request for Eliquis  received. Indication: Afib  Last office visit: 05/26/24 Anice)  Scr: 1.27 (08/03/24)  Age: 63 Weight: 88.5kg  Appropriate dose. Refill sent.

## 2024-08-17 NOTE — Telephone Encounter (Signed)
*  STAT* If patient is at the pharmacy, call can be transferred to refill team.   1. Which medications need to be refilled? (please list name of each medication and dose if known) apixaban  (ELIQUIS ) 5 MG TABS tablet   2. Which pharmacy/location (including street and city if local pharmacy) is medication to be sent to?  [id number] - [place], [place] - [address]      3. Do they need a 30 day or 90 day supply? 90 day

## 2024-08-23 ENCOUNTER — Encounter (INDEPENDENT_AMBULATORY_CARE_PROVIDER_SITE_OTHER): Payer: Self-pay | Admitting: Otolaryngology

## 2024-08-23 ENCOUNTER — Ambulatory Visit (INDEPENDENT_AMBULATORY_CARE_PROVIDER_SITE_OTHER): Admitting: Otolaryngology

## 2024-08-23 VITALS — BP 111/73 | HR 77

## 2024-08-23 DIAGNOSIS — G4733 Obstructive sleep apnea (adult) (pediatric): Secondary | ICD-10-CM

## 2024-08-23 DIAGNOSIS — Z9889 Other specified postprocedural states: Secondary | ICD-10-CM

## 2024-08-23 MED ORDER — LEVOCETIRIZINE DIHYDROCHLORIDE 5 MG PO TABS: 5.0000 mg | ORAL_TABLET | Freq: Every evening | ORAL | 3 refills | Status: DC

## 2024-08-23 NOTE — Progress Notes (Signed)
 ENT Post-Op visit    Swelling is better, and pain is gone. He was not able to use Arnica. Still has bruising.   PE: Submental area with ecchymosis and fullness, softer and less prominent than last time Floor of mouth appears normal Tongue mobility intact  A/P Hematoma, improving  - will do video visit in 2 weeks - arnica for bruising  - scheduled for activation at [facility] 10/15 and we will keep that appt

## 2024-08-24 ENCOUNTER — Telehealth: Payer: Self-pay

## 2024-08-24 ENCOUNTER — Ambulatory Visit: Admitting: Cardiology

## 2024-08-24 NOTE — Telephone Encounter (Signed)
 Dexcom needs PA

## 2024-08-29 ENCOUNTER — Telehealth: Payer: Self-pay

## 2024-08-29 ENCOUNTER — Ambulatory Visit: Attending: Student | Admitting: Student

## 2024-08-29 ENCOUNTER — Encounter: Payer: Self-pay | Admitting: Student

## 2024-08-29 VITALS — BP 122/60 | HR 79 | Ht 70.0 in | Wt 196.0 lb

## 2024-08-29 DIAGNOSIS — I480 Paroxysmal atrial fibrillation: Secondary | ICD-10-CM | POA: Diagnosis not present

## 2024-08-29 DIAGNOSIS — I2510 Atherosclerotic heart disease of native coronary artery without angina pectoris: Secondary | ICD-10-CM | POA: Insufficient documentation

## 2024-08-29 DIAGNOSIS — D6869 Other thrombophilia: Secondary | ICD-10-CM | POA: Insufficient documentation

## 2024-08-29 DIAGNOSIS — I10 Essential (primary) hypertension: Secondary | ICD-10-CM | POA: Diagnosis present

## 2024-08-29 DIAGNOSIS — G4733 Obstructive sleep apnea (adult) (pediatric): Secondary | ICD-10-CM | POA: Diagnosis not present

## 2024-08-29 MED ORDER — DEXCOM G7 SENSOR MISC: Status: AC

## 2024-08-29 NOTE — Progress Notes (Signed)
  Electrophysiology Office Note:   Date:  08/29/2024  ID:  [name] [name], DOB July 31, 1961, MRN [id number]  Primary Cardiologist: Ozell Fell, MD Electrophysiologist: Fonda Kitty, MD   Electrophysiologist:  Fonda Kitty, MD      History of Present Illness:   [name] [name] is a 63 y.o. male with h/o AF, HTN, CAD s/p STEMI with PCI, OSA on BiPAP, DM II, and anxiety / depression  seen today for routine electrophysiology followup.   Since last being seen in our clinic the patient reports doing well overall. He is s/p inspire device. Overall, he denies chest pain, palpitations, dyspnea, PND, orthopnea, nausea, vomiting, dizziness, syncope, edema, weight gain, or early satiety.   Review of systems complete and found to be negative unless listed in HPI.   EP Information / Studies Reviewed:    EKG is ordered today. Personal review as below.  EKG Interpretation Date/Time:  Tuesday August 29 2024 09:46:52 EDT Ventricular Rate:  79 PR Interval:  146 QRS Duration:  84 QT Interval:  378 QTC Calculation: 433 R Axis:   63  Text Interpretation: Normal sinus rhythm Confirmed by Lesia Ozell [phone]) on 08/29/2024 9:54:59 AM    Arrhythmia/Device History S/p PVI and posterior wall ablation 02/01/2024   Physical Exam:   VS:  BP 122/60   Pulse 79   Ht 5' 10 (1.778 m)   Wt 196 lb (88.9 kg)   SpO2 98%   BMI 28.12 kg/m    Wt Readings from Last 3 Encounters:  08/29/24 196 lb (88.9 kg)  08/09/24 195 lb (88.5 kg)  08/03/24 198 lb 3.2 oz (89.9 kg)     GEN: No acute distress NECK: No JVD; No carotid bruits CARDIAC: Regular rate and rhythm, no murmurs, rubs, gallops RESPIRATORY:  Clear to auscultation without rales, wheezing or rhonchi  ABDOMEN: Soft, non-tender, non-distended EXTREMITIES:  No edema; No deformity   ASSESSMENT AND PLAN:    Paroxysmal AF EKG today shows NSR Continue eliquis  5 mg BID for CHA2DS2VASc of at least 3 for now.  Planning for Watchman   09/14/2024 Pre-procedure labs today.  Secondary hypercoagulable state Pt on Eliquis  as above   HTN Stable on current regimen   OSA  Encouraged nightly CPAP   CAD No s/s of ischemia.       Follow up with EP Team as usual post procedure  Signed, Ozell Prentice Lesia, PA-C

## 2024-08-29 NOTE — Telephone Encounter (Signed)
Dexcom?

## 2024-08-29 NOTE — Patient Instructions (Addendum)
 Medication Instructions:  Your physician recommends that you continue on your current medications as directed. Please refer to the Current Medication list given to you today.  *If you need a refill on your cardiac medications before your next appointment, please call your pharmacy*  Lab Work: TODAY:  BMET & CBC  If you have labs (blood work) drawn today and your tests are completely normal, you will receive your results only by: MyChart Message (if you have MyChart) OR A paper copy in the mail If you have any lab test that is abnormal or we need to change your treatment, we will call you to review the results.  Testing/Procedures: None ordered  Follow-Up: At [facility], you and your health needs are our priority.  As part of our continuing mission to provide you with exceptional heart care, our providers are all part of one team.  This team includes your primary Cardiologist (physician) and Advanced Practice Providers or APPs (Physician Assistants and Nurse Practitioners) who all work together to provide you with the care you need, when you need it.  Your next appointment:   As scheduled    Provider:      Dr. Kennyth  We recommend signing up for the patient portal called MyChart.  Sign up information is provided on this After Visit Summary.  MyChart is used to connect with patients for Virtual Visits (Telemedicine).  Patients are able to view lab/test results, encounter notes, upcoming appointments, etc.  Non-urgent messages can be sent to your provider as well.   To learn more about what you can do with MyChart, go to [email or web address].   Other Instructions

## 2024-08-29 NOTE — Telephone Encounter (Signed)
 Patient wants samples - will be in this a morning to pick up sample

## 2024-08-29 NOTE — Telephone Encounter (Signed)
Samples placed up front 

## 2024-08-29 NOTE — Telephone Encounter (Signed)
 Patient picked up patient assistance - Log Noted Patient picked up Dexcom sensors-x2 on 08/29/24

## 2024-08-30 ENCOUNTER — Telehealth: Payer: Self-pay

## 2024-08-30 ENCOUNTER — Other Ambulatory Visit (HOSPITAL_COMMUNITY): Payer: Self-pay

## 2024-08-30 ENCOUNTER — Ambulatory Visit: Payer: Self-pay | Admitting: Student

## 2024-08-30 LAB — BASIC METABOLIC PANEL WITH GFR
BUN/Creatinine Ratio: 28 — ABNORMAL HIGH (ref 10–24)
BUN: 27 mg/dL (ref 8–27)
CO2: 21 mmol/L (ref 20–29)
Calcium: 9.1 mg/dL (ref 8.6–10.2)
Chloride: 106 mmol/L (ref 96–106)
Creatinine, Ser: 0.98 mg/dL (ref 0.76–1.27)
Glucose: 108 mg/dL — ABNORMAL HIGH (ref 70–99)
Potassium: 4.6 mmol/L (ref 3.5–5.2)
Sodium: 143 mmol/L (ref 134–144)
eGFR: 88 mL/min/1.73 (ref >59)

## 2024-08-30 LAB — CBC
Hematocrit: 48.7 % (ref 37.5–51.0)
Hemoglobin: 15.7 g/dL (ref 13.0–17.7)
MCH: 30.6 pg (ref 26.6–33.0)
MCHC: 32.2 g/dL (ref 31.5–35.7)
MCV: 95 fL (ref 79–97)
Platelets: 229 x10E3/uL (ref 150–450)
RBC: 5.13 x10E6/uL (ref 4.14–5.80)
RDW: 14.6 % (ref 11.6–15.4)
WBC: 14.4 x10E3/uL — ABNORMAL HIGH (ref 3.4–10.8)

## 2024-08-30 NOTE — Telephone Encounter (Signed)
 Patient called wanting a PA submitted for Dexcom, when advised that a request was made for a PA on 08.28.25. Patient stated that insurance advised him that they do not have one.  Patient is upset, would not allow me to try and connect him, declined to speak with or leave a message for the medical assistant, and he then disconnected call.

## 2024-08-30 NOTE — Telephone Encounter (Signed)
 NA

## 2024-08-30 NOTE — Telephone Encounter (Signed)
 I contacted the patient and advise that the message was created on 08/24/24 but I never routed the message. I apologized for the error and advise patient that I have reached out to the PA team to see if we can get processed today. Patient is not out of sensors as he picked up some yesterday.

## 2024-08-30 NOTE — Telephone Encounter (Signed)
 Can we process PA urgently for Dexcom

## 2024-08-30 NOTE — Telephone Encounter (Signed)
 Pharmacy Patient Advocate Encounter   Received notification from Pt Calls Messages that prior authorization for Dexcom G7 sensor is required/requested.   Insurance verification completed.   The patient is insured through [facility] .  Per test claim: PA required; PA started via CoverMyMeds. KEY [id number] . Please see clinical question(s) below that I am not finding the answer to in their chart and advise.    I see last office visit was 05/01/24. Pt will need to be seen before PA can be approved.

## 2024-08-30 NOTE — Telephone Encounter (Signed)
 Patient aware and we will process on Friday after his office visit.

## 2024-09-01 ENCOUNTER — Other Ambulatory Visit (HOSPITAL_COMMUNITY): Payer: Self-pay | Admitting: Specialist

## 2024-09-01 ENCOUNTER — Encounter: Payer: Self-pay | Admitting: Internal Medicine

## 2024-09-01 ENCOUNTER — Ambulatory Visit: Admitting: Internal Medicine

## 2024-09-01 VITALS — BP 110/68 | HR 77 | Ht 70.0 in | Wt 195.0 lb

## 2024-09-01 DIAGNOSIS — E1142 Type 2 diabetes mellitus with diabetic polyneuropathy: Secondary | ICD-10-CM

## 2024-09-01 DIAGNOSIS — E1159 Type 2 diabetes mellitus with other circulatory complications: Secondary | ICD-10-CM

## 2024-09-01 DIAGNOSIS — Z794 Long term (current) use of insulin: Secondary | ICD-10-CM | POA: Diagnosis not present

## 2024-09-01 DIAGNOSIS — M25512 Pain in left shoulder: Secondary | ICD-10-CM

## 2024-09-01 DIAGNOSIS — Z7985 Long-term (current) use of injectable non-insulin antidiabetic drugs: Secondary | ICD-10-CM

## 2024-09-01 NOTE — Patient Instructions (Addendum)
-   Continue Jardiance  25 mg, 1 tablet every morning ,  - Continue Ozempic  2 mg once weekly - Decrease Lantus  40 units - Decrease Humalog  25-30 units with each meal -Humalog  correctional insulin : ADD extra units on insulin  to your meal-time Humalog   dose if your blood sugars are higher than 150. Use the scale below to help guide you before each meal.  Blood sugar before meal Number of units to inject  Less than 150 0 unit  151 -  170 1 units  171 -  190 2 units  191 -  210 3 units  211 -  230 4 units  231 -  250 5 units  251 -  270 6 units  271 -  290 7 units  291 -  310 8 units       HOW TO TREAT LOW BLOOD SUGARS (Blood sugar LESS THAN 70 MG/DL) Please follow the RULE OF 15 for the treatment of hypoglycemia treatment (when your (blood sugars are less than 70 mg/dL)   STEP 1: Take 15 grams of carbohydrates when your blood sugar is low, which includes:  3-4 GLUCOSE TABS  OR 3-4 OZ OF JUICE OR REGULAR SODA OR ONE TUBE OF GLUCOSE GEL    STEP 2: RECHECK blood sugar in 15 MINUTES STEP 3: If your blood sugar is still low at the 15 minute recheck --> then, go back to STEP 1 and treat AGAIN with another 15 grams of carbohydrates.

## 2024-09-01 NOTE — Telephone Encounter (Signed)
Patient had visit today.

## 2024-09-01 NOTE — Progress Notes (Signed)
 Name: [name] [name]  Age/ Sex: 63 y.o., male   MRN/ DOB: [id number], 1961/02/01     PCP: Kip Righter, MD   Reason for Endocrinology Evaluation: Type 2 Diabetes Mellitus  Initial Endocrine Consultative Visit: 11/13/2020    PATIENT IDENTIFIER: [name] [name] is a 63 y.o. male with a past medical history of T2DM, CAD . The patient has followed with Endocrinology clinic since 11/13/2020 for consultative assistance with management of his diabetes.  DIABETIC HISTORY:  [name] [name] was diagnosed with DM in 2008, Metformin - diarrhea , glipizide - hypoglycemia. His hemoglobin A1c has ranged from 8.4% in 2021, peaking at 10.5% in 2020.  Patient was lost to follow-up for approximately 2 years from 2022 until his return to our office by 08/2023  He was started by his PCPs office on basal/prandial insulin  by 02/2023 with an A1c of >14.0%  Ozempic  started 08/2023  HYPOGONADISM HISTORY: Has been on testosterone  for many years,   He has no prior radiation or surgeries on testicles.  He is not on opiates  He asked me to take this over 06/2021   By 10/2021 he asked to switch to topical testosterone .   Testosterone  was on hold in 2024 due to MI  SUBJECTIVE:   During the last visit (05/01/2024): A1c 5.5%      Today (09/01/2024): [name] [name]  is here for a follow up on diabetes management. He checks his glucose multiple times a day through dexcom, he is symptomatic in the 40's . He also noted faulty sensors.   Patient continues to follow-up with cardiology for CAD and A-fib, he is s/p cardioversion 01/2024.  He is being considered for left atrial appendage closure device, scheduled for 09/14/2024 He follows with ENT for OSA, he is s/p insertion of hypoglossal nerve stimulator  He continues to follow-up with pulmonary for pulmonary nodule  Denies palpitations  Denies nausea or vomiting  Denies constipation but has noted occasional diarrhea that he attributes to stress    HOME  ENDOCRINE  REGIMEN:  Jardiance  25 mg daily  Ozempic  2 mg weekly Lantus  50 units daily  Humalog  36 units TIDQAC   Statin: yes ACE-I/ARB: yes Prior Diabetic Education: yes  CONTINUOUS GLUCOSE MONITORING RECORD INTERPRETATION    Dates of Recording: 8/23-09/01/2024  Sensor description: dexcom  Results statistics:   CGM use % of time 93  Average and SD 121/33  Time in range 91 %  % Time Above 180 6  % Time above 250 0  % Time Below target 3   Glycemic patterns summary: BGs are optimal throughout the day and night Hyperglycemic episodes N/A  Hypoglycemic episodes occurred during the day and night  Overnight periods: Trends down     DIABETIC COMPLICATIONS: Microvascular complications:   Denies: CKD, retinopathy, neuropathy Last Eye Exam: Completed 01/2021  Macrovascular complications:  CAD (06/2023), s/p PCI with stent placement 10/2023 Denies:  CVA, PVD   HISTORY:  Past Medical History:  Past Medical History:  Diagnosis Date   ADHD (attention deficit hyperactivity disorder)    Anxiety    Coronary artery disease    06/2023 - PCI to circumflex and pLAD PCI 11/10/23   Depression    Diabetes mellitus    Type 2   Dyspnea    Dysrhythmia    A. Fib s/p ablation 02/01/24   Enlarged prostate    Environmental allergies    Environmental and seasonal allergies    GERD (gastroesophageal reflux disease)    Hyperlipidemia  Hypertension    IBS (irritable bowel syndrome)    Low testosterone     Myocardial infarction [facility]) 06/2023   Inferior Lateral STEMI   Pneumonia    S/P angioplasty with stent to distal LCX and mid LCX both DES 07/25/23 07/26/2023   Sleep apnea    has used BiPAP in past; last sleep study >10 years   Past Surgical History:  Past Surgical History:  Procedure Laterality Date   ATRIAL FIBRILLATION ABLATION N/A 02/01/2024   Procedure: ATRIAL FIBRILLATION ABLATION;  Surgeon: Kennyth Chew, MD;  Location: [facility] INVASIVE CV LAB;  Service: Cardiovascular;   Laterality: N/A;   COLONOSCOPY  09/2018   normal, repeat in 10 years per patient.  [place], [place]   CORONARY STENT INTERVENTION N/A 11/10/2023   Procedure: CORONARY STENT INTERVENTION;  Surgeon: Wonda Sharper, MD;  Location: [facility] INVAS[facility] CV LAB;  Service: Cardiovascular;  Laterality: N/A;   CORONARY ULTRASOUND/IVUS N/A 11/10/2023   Procedure: Coronary Ultrasound/IVUS;  Surgeon: Wonda Sharper, MD;  Location: [facility] INVASIVE CV LAB;  Service: Cardiovascular;  Laterality: N/A;   CORONARY/GRAFT ACUTE MI REVASCULARIZATION N/A 07/25/2023   Procedure: Coronary/Graft Acute MI Revascularization;  Surgeon: Wonda Sharper, MD;  Location: [facility] INVASIVE CV LAB;  Service: Cardiovascular;  Laterality: N/A;   DRUG INDUCED ENDOSCOPY N/A 05/10/2024   Procedure: DRUG INDUCED SLEEP ENDOSCOPY;  Surgeon: Okey Burns, MD;  Location: [facility] OR;  Service: ENT;  Laterality: N/A;   IMPLANTATION OF HYPOGLOSSAL NERVE STIMULATOR N/A 08/09/2024   Procedure: INSERTION, HYPOGLOSSAL NERVE STIMULATOR;  Surgeon: Okey Burns, MD;  Location: [facility] OR;  Service: ENT;  Laterality: N/A;   KNEE ARTHROSCOPY Left    LEFT HEART CATH AND CORONARY ANGIOGRAPHY N/A 07/25/2023   Procedure: LEFT HEART CATH AND CORONARY ANGIOGRAPHY;  Surgeon: Wonda Sharper, MD;  Location: [facility] INVASIVE CV LAB;  Service: Cardiovascular;  Laterality: N/A;   RENAL ANGIOGRAPHY N/A 07/16/2020   Procedure: RENAL ANGIOGRAPHY;  Surgeon: Ladona Heinz, MD;  Location: [facility] INVASIVE CV LAB;  Service: Cardiovascular;  Laterality: N/A;   SACROILIAC JOINT FUSION Right 05/02/2014   Procedure: SACROILIAC JOINT FUSION;  Surgeon: Oneil Rodgers Priestly, MD;  Location: [facility] OR;  Service: Orthopedics;  Laterality: Right;  Right sided sacroiliac joint fusion   TONSILLECTOMY  2013    uvula tacked up per pt   Social History:  reports that he quit smoking about 32 years ago. His smoking use included cigarettes. He started smoking about 42 years ago. He has a 10 pack-year smoking history. He  has never used smokeless tobacco. He reports that he does not drink alcohol and does not use drugs. Family History:  Family History  Problem Relation Age of Onset   Cancer Mother        lung   Cancer Father        prostate, mets to bone, lung   Cancer Brother        melanoma   Heart disease Maternal Grandmother        MI   Lung disease Maternal Grandfather        black lung   Heart disease Paternal Grandmother    Stroke Paternal Grandmother    Heart disease Paternal Grandfather    Heart disease Brother 25       MI   Diabetes Brother      HOME MEDICATIONS: Allergies as of 09/01/2024       Reactions   Pravastatin Other (See Comments)   Joint myalgias   Trazodone And Nefazodone    Had restless legs  Hydrochlorothiazide  Other (See Comments)   lightheadedness   Latex Rash   Metformin  And Related Diarrhea   Zetia  [ezetimibe ] Other (See Comments)   Myalgias, joint pain        Medication List        Accurate as of September 01, 2024  8:40 AM. If you have any questions, ask your nurse or doctor.          acetaminophen  500 MG tablet Commonly known as: TYLENOL  Take 1 tablet (500 mg total) by mouth every 6 (six) hours. Take every 6 hrs x 2-3 days then take every 6 hrs as needed   amLODipine  5 MG tablet Commonly known as: NORVASC  Take 1 tablet (5 mg total) by mouth daily.   amoxicillin -clavulanate 875-125 MG tablet Commonly known as: AUGMENTIN  Take 1 tablet by mouth 2 (two) times daily.   apixaban  5 MG Tabs tablet Commonly known as: ELIQUIS  Take 1 tablet (5 mg total) by mouth 2 (two) times daily.   aspirin  EC 81 MG tablet Take 1 tablet (81 mg total) by mouth daily. Swallow whole.   aspirin  EC 81 MG tablet Take 1 tablet (81 mg total) by mouth daily. Swallow whole.   atorvastatin  40 MG tablet Commonly known as: LIPITOR Take 40 mg by mouth daily.   carvedilol  25 MG tablet Commonly known as: COREG  TAKE 1 TABLET(25 MG) BY MOUTH TWICE DAILY   Dexcom G7  Sensor Misc Change sensor every 10 days   Dexcom G7 Sensor Misc Change every 10 days   empagliflozin  25 MG Tabs tablet Commonly known as: Jardiance  Take 1 tablet (25 mg total) by mouth daily before breakfast.   HumaLOG  KwikPen 200 UNIT/ML KwikPen Generic drug: insulin  lispro Max daily 120 units   ibuprofen  600 MG tablet Commonly known as: ADVIL  Take 1 tablet (600 mg total) by mouth every 6 (six) hours. Please take every 6 hrs, and stagger this medication 3 hrs apart from Tylenol    Insulin  Pen Needle 32G X 4 MM Misc 1 Device by Does not apply route in the morning, at noon, in the evening, and at bedtime.   Unifine Pentips 32G X 4 MM Misc Generic drug: Insulin  Pen Needle 1 Device by Other route in the morning, at noon, in the evening, and at bedtime.   ipratropium 0.03 % nasal spray Commonly known as: ATROVENT  Place 2 sprays into both nostrils 3 (three) times daily as needed for rhinitis.   Lantus  SoloStar 100 UNIT/ML Solostar Pen Generic drug: insulin  glargine Inject 50 Units into the skin daily. What changed: when to take this   levocetirizine 5 MG tablet Commonly known as: Xyzal  Allergy  24HR Take 1 tablet (5 mg total) by mouth every evening.   losartan  100 MG tablet Commonly known as: COZAAR  Take 1 tablet (100 mg total) by mouth daily.   methylPREDNISolone  4 MG Tbpk tablet Commonly known as: MEDROL  DOSEPAK Take with signs of chronic sinusitis and take as directed   nitroGLYCERIN  0.4 MG SL tablet Commonly known as: NITROSTAT  Place 1 tablet (0.4 mg total) under the tongue every 5 (five) minutes as needed for chest pain.   oxyCODONE  5 MG immediate release tablet Commonly known as: Roxicodone  Take 1 tablet (5 mg total) by mouth every 6 (six) hours as needed.   pantoprazole  40 MG tablet Commonly known as: PROTONIX  Take 1 tablet (40 mg total) by mouth daily.   predniSONE  10 MG tablet Commonly known as: DELTASONE  Take 1 tablet (10 mg total) by mouth daily with  breakfast.  Take two tabs x 3 days and then one tab for 3 days   Repatha  SureClick 140 MG/ML Soaj Generic drug: Evolocumab  ADMINISTER 1 ML UNDER THE SKIN EVERY 14 DAYS.   rOPINIRole  2 MG tablet Commonly known as: REQUIP  Take 2 mg by mouth at bedtime.   Semaglutide  (2 MG/DOSE) 8 MG/3ML Sopn Inject 2 mg as directed once a week. What changed: when to take this   triamcinolone  cream 0.1 % Commonly known as: KENALOG  Apply 1 Application topically 2 (two) times daily. What changed:  when to take this reasons to take this   Trintellix  20 MG Tabs tablet Generic drug: vortioxetine  HBr Take 1 tablet (20 mg total) by mouth daily.         OBJECTIVE:   Vital Signs: BP 110/68 (BP Location: Left Arm, Patient Position: Sitting, Cuff Size: Normal)   Pulse 77   Ht 5' 10 (1.778 m)   Wt 195 lb (88.5 kg)   SpO2 99%   BMI 27.98 kg/m   Wt Readings from Last 3 Encounters:  09/01/24 195 lb (88.5 kg)  08/29/24 196 lb (88.9 kg)  08/09/24 195 lb (88.5 kg)     Exam: General: Pt appears well and is in NAD  Lungs: Clear with good BS bilat   Heart: RRR   Extremities: No pretibial edema.  Neuro: MS is good with appropriate affect, pt is alert and Ox3     DATA REVIEWED:  Lab Results  Component Value Date   HGBA1C 5.3 08/03/2024   HGBA1C 5.5 05/01/2024   HGBA1C 6.1 (A) 01/26/2024    Latest Reference Range & Units 08/29/24 10:55  Sodium 134 - 144 mmol/L 143  Potassium 3.5 - 5.2 mmol/L 4.6  Chloride 96 - 106 mmol/L 106  CO2 20 - 29 mmol/L 21  Glucose 70 - 99 mg/dL 891 (H)  BUN 8 - 27 mg/dL 27  Creatinine 9.23 - 8.72 mg/dL 9.01  Calcium  8.6 - 10.2 mg/dL 9.1  BUN/Creatinine Ratio 10 - 24  28 (H)  eGFR >59 mL/min/1.73 88  (H): Data is abnormally high Old records , labs and images have been reviewed.    ASSESSMENT / PLAN / RECOMMENDATIONS:   1) Type 2 Diabetes Mellitus, Optimally  controlled  , With Neuropathic  and macrovascular complications - Most recent A1c of 5.3 %. Goal  A1c < 7.0 %.   -A1c low at 5.3%, this is most likely due to hypoglycemia.  Patient underestimates hypoglycemia.  We did discuss the fatal effects of hypoglycemia -Will decrease basal insulin  as below -Will also decrease Humalog  as below   MEDICATIONS: - Continue Jardiance  25 mg daily  - Continue Ozempic  2 mg weekly -Decrease Lantus  40 units daily  -Decrease Humalog  25-30 units 3 times daily before every meal   EDUCATION / INSTRUCTIONS: BG monitoring instructions: Patient is instructed to check his blood sugars 3 times a day. Call [facility] if: BG persistently < 70  I reviewed the Rule of 15 for the treatment of hypoglycemia in detail with the patient. Literature supplied.    2) Diabetic complications:  Eye: Does not have known diabetic retinopathy.  Neuro/ Feet: Does have known diabetic peripheral neuropathy based on foot exam 12/15/2021 Renal: Patient does not have known baseline CKD. He   is  on an ACEI/ARB at present.    3) CAD/Dyslipidemia:  - Per Cardiology    F/U in 4 months    Signed electronically by: Stefano Redgie Butts, MD  [facility] Endocrinology  [facility] [address] [place], [place] [address] Phone: [phone] FAX: [phone]   CC: Kip Righter, MD [address] [place] [address] Phone: [phone]  Fax: [phone]  Return to Endocrinology clinic as below: Future Appointments  Date Time Provider Department Center  09/01/2024  8:50 AM Maurice Fotheringham, Donell Cardinal, MD [facility] None  10/11/2024 11:00 AM Dohmeier, Dedra, MD [facility] None  10/31/2024 10:00 AM Lesia Ozell Barter, PA-C [facility] H&V  06/05/2025 10:30 AM GI[facility] 1 [facility]

## 2024-09-04 NOTE — Telephone Encounter (Signed)
 PA has been submitted.

## 2024-09-08 ENCOUNTER — Ambulatory Visit (HOSPITAL_COMMUNITY)
Admission: RE | Admit: 2024-09-08 | Discharge: 2024-09-08 | Disposition: A | Discharge status: Home / Self Care | Source: Ambulatory Visit | Attending: Specialist | Admitting: Specialist

## 2024-09-08 ENCOUNTER — Encounter (HOSPITAL_COMMUNITY): Payer: Self-pay

## 2024-09-08 ENCOUNTER — Telehealth: Payer: Self-pay

## 2024-09-08 DIAGNOSIS — M25512 Pain in left shoulder: Secondary | ICD-10-CM

## 2024-09-08 NOTE — Telephone Encounter (Signed)
 Confirmed procedure date of 09/14/2024. Confirmed arrival time of 0530 for procedure time at 0730. Reviewed pre-procedure instructions with patient. Confirmed he will not take semaglutide  within 7 days of procedure. He will take Jardiance  Sunday then STOP. Confirmed he has no PPM or defibrillator. The patient understands to call if questions/concerns arise prior to procedure. He was grateful for call and agreed with plan.

## 2024-09-13 NOTE — Anesthesia Preprocedure Evaluation (Signed)
 Anesthesia Evaluation  Patient identified by MRN, date of birth, ID band Patient awake    Reviewed: Allergy  & Precautions, NPO status , Patient's Chart, lab work & pertinent test results, reviewed documented beta blocker date and time   History of Anesthesia Complications Negative for: history of anesthetic complications  Airway Mallampati: I  TM Distance: >3 FB Neck ROM: Full    Dental  (+) Caps, Dental Advisory Given   Pulmonary sleep apnea (does not use CPAP) , COPD,  COPD inhaler, former smoker   breath sounds clear to auscultation       Cardiovascular hypertension, Pt. on medications and Pt. on home beta blockers (-) angina + CAD, + Past MI and + Cardiac Stents  + dysrhythmias Atrial Fibrillation  Rhythm:Irregular Rate:Normal  '24 ECHO: EF 50-55%, low normal LVF, Grade 1 DD, mild LVH, normal RVF, trivial MR   Neuro/Psych  Headaches PSYCHIATRIC DISORDERS (ADHD) Anxiety Depression       GI/Hepatic Neg liver ROS,GERD  Medicated and Controlled,,  Endo/Other  diabetes (glu 120), Oral Hypoglycemic Agents, Insulin  Dependent  Semaglutide : 12d ago last shot  Renal/GU negative Renal ROS     Musculoskeletal  (+) Arthritis ,    Abdominal   Peds  Hematology eliquis    Anesthesia Other Findings   Reproductive/Obstetrics                              Anesthesia Physical Anesthesia Plan  ASA: 3  Anesthesia Plan: General   Post-op Pain Management: Tylenol  PO (pre-op)* and Minimal or no pain anticipated   Induction: Intravenous  PONV Risk Score and Plan: 2 and Ondansetron   Airway Management Planned: Oral ETT  Additional Equipment: ClearSight  Intra-op Plan:   Post-operative Plan: Extubation in OR  Informed Consent: I have reviewed the patients History and Physical, chart, labs and discussed the procedure including the risks, benefits and alternatives for the proposed anesthesia with the  patient or authorized representative who has indicated his/her understanding and acceptance.     Dental advisory given  Plan Discussed with: CRNA and Surgeon  Anesthesia Plan Comments:          Anesthesia Quick Evaluation

## 2024-09-14 ENCOUNTER — Encounter (HOSPITAL_COMMUNITY): Payer: Self-pay | Admitting: Anesthesiology

## 2024-09-14 ENCOUNTER — Encounter (HOSPITAL_COMMUNITY): Payer: Self-pay | Admitting: Cardiology

## 2024-09-14 ENCOUNTER — Ambulatory Visit (HOSPITAL_BASED_OUTPATIENT_CLINIC_OR_DEPARTMENT_OTHER)

## 2024-09-14 ENCOUNTER — Other Ambulatory Visit: Payer: Self-pay

## 2024-09-14 ENCOUNTER — Observation Stay (HOSPITAL_COMMUNITY)
Admission: AD | Admit: 2024-09-14 | Discharge: 2024-09-14 | Disposition: A | Discharge status: Home / Self Care | Attending: Cardiology | Admitting: Cardiology

## 2024-09-14 ENCOUNTER — Inpatient Hospital Stay (HOSPITAL_COMMUNITY)
Admission: AD | Disposition: A | Discharge status: Home / Self Care | Payer: Self-pay | Source: Home / Self Care | Attending: Cardiology

## 2024-09-14 ENCOUNTER — Ambulatory Visit (HOSPITAL_BASED_OUTPATIENT_CLINIC_OR_DEPARTMENT_OTHER): Payer: Self-pay | Admitting: Anesthesiology

## 2024-09-14 DIAGNOSIS — Z87891 Personal history of nicotine dependence: Secondary | ICD-10-CM | POA: Insufficient documentation

## 2024-09-14 DIAGNOSIS — D6869 Other thrombophilia: Secondary | ICD-10-CM | POA: Diagnosis present

## 2024-09-14 DIAGNOSIS — I10 Essential (primary) hypertension: Secondary | ICD-10-CM | POA: Insufficient documentation

## 2024-09-14 DIAGNOSIS — I480 Paroxysmal atrial fibrillation: Principal | ICD-10-CM | POA: Diagnosis present

## 2024-09-14 DIAGNOSIS — Z7982 Long term (current) use of aspirin: Secondary | ICD-10-CM | POA: Insufficient documentation

## 2024-09-14 DIAGNOSIS — Z23 Encounter for immunization: Secondary | ICD-10-CM | POA: Insufficient documentation

## 2024-09-14 DIAGNOSIS — Z794 Long term (current) use of insulin: Secondary | ICD-10-CM | POA: Insufficient documentation

## 2024-09-14 DIAGNOSIS — Z95818 Presence of other cardiac implants and grafts: Secondary | ICD-10-CM | POA: Insufficient documentation

## 2024-09-14 DIAGNOSIS — I2510 Atherosclerotic heart disease of native coronary artery without angina pectoris: Secondary | ICD-10-CM

## 2024-09-14 DIAGNOSIS — J449 Chronic obstructive pulmonary disease, unspecified: Secondary | ICD-10-CM | POA: Diagnosis present

## 2024-09-14 DIAGNOSIS — Z79899 Other long term (current) drug therapy: Secondary | ICD-10-CM | POA: Insufficient documentation

## 2024-09-14 DIAGNOSIS — E119 Type 2 diabetes mellitus without complications: Secondary | ICD-10-CM | POA: Diagnosis present

## 2024-09-14 DIAGNOSIS — G4733 Obstructive sleep apnea (adult) (pediatric): Secondary | ICD-10-CM | POA: Diagnosis present

## 2024-09-14 DIAGNOSIS — Z7901 Long term (current) use of anticoagulants: Secondary | ICD-10-CM | POA: Diagnosis not present

## 2024-09-14 DIAGNOSIS — E1159 Type 2 diabetes mellitus with other circulatory complications: Secondary | ICD-10-CM | POA: Diagnosis not present

## 2024-09-14 DIAGNOSIS — Z7902 Long term (current) use of antithrombotics/antiplatelets: Secondary | ICD-10-CM

## 2024-09-14 DIAGNOSIS — Z006 Encounter for examination for normal comparison and control in clinical research program: Secondary | ICD-10-CM

## 2024-09-14 DIAGNOSIS — I2119 ST elevation (STEMI) myocardial infarction involving other coronary artery of inferior wall: Secondary | ICD-10-CM | POA: Diagnosis not present

## 2024-09-14 DIAGNOSIS — Z8701 Personal history of pneumonia (recurrent): Secondary | ICD-10-CM

## 2024-09-14 DIAGNOSIS — I252 Old myocardial infarction: Secondary | ICD-10-CM

## 2024-09-14 DIAGNOSIS — Z955 Presence of coronary angioplasty implant and graft: Secondary | ICD-10-CM

## 2024-09-14 DIAGNOSIS — Z91040 Latex allergy status: Secondary | ICD-10-CM | POA: Insufficient documentation

## 2024-09-14 DIAGNOSIS — I119 Hypertensive heart disease without heart failure: Secondary | ICD-10-CM | POA: Diagnosis present

## 2024-09-14 DIAGNOSIS — G8918 Other acute postprocedural pain: Secondary | ICD-10-CM | POA: Diagnosis not present

## 2024-09-14 DIAGNOSIS — E785 Hyperlipidemia, unspecified: Secondary | ICD-10-CM | POA: Diagnosis present

## 2024-09-14 HISTORY — PX: TRANSESOPHAGEAL ECHOCARDIOGRAM (CATH LAB): EP1270

## 2024-09-14 HISTORY — PX: LEFT ATRIAL APPENDAGE OCCLUSION: EP1229

## 2024-09-14 LAB — GLUCOSE, CAPILLARY: Glucose-Capillary: 120 mg/dL — ABNORMAL HIGH (ref 70–99)

## 2024-09-14 LAB — TYPE AND SCREEN
ABO/RH(D): A POS
Antibody Screen: NEGATIVE

## 2024-09-14 LAB — SURGICAL PCR SCREEN
MRSA, PCR: NEGATIVE
Staphylococcus aureus: NEGATIVE

## 2024-09-14 LAB — MRSA NEXT GEN BY PCR, NASAL: MRSA by PCR Next Gen: NOT DETECTED

## 2024-09-14 LAB — ECHO TEE

## 2024-09-14 SURGERY — LEFT ATRIAL APPENDAGE OCCLUSION
Anesthesia: General

## 2024-09-14 MED ORDER — LIDOCAINE 2% (20 MG/ML) 5 ML SYRINGE
INTRAMUSCULAR | Status: DC | PRN
  Administered 2024-09-14: 20 mg via INTRAVENOUS

## 2024-09-14 MED ORDER — TRAMADOL HCL 50 MG PO TABS
50.0000 mg | ORAL_TABLET | Freq: Once | ORAL | Status: AC
  Administered 2024-09-14: 50 mg via ORAL
  Filled 2024-09-14: qty 1

## 2024-09-14 MED ORDER — SODIUM CHLORIDE 0.9% FLUSH
3.0000 mL | Freq: Two times a day (BID) | INTRAVENOUS | Status: DC
  Administered 2024-09-14: 3 mL via INTRAVENOUS

## 2024-09-14 MED ORDER — SODIUM CHLORIDE 0.9% FLUSH: 3.0000 mL | INTRAVENOUS | Status: DC | PRN

## 2024-09-14 MED ORDER — OXYCODONE HCL 5 MG PO TABS
5.0000 mg | ORAL_TABLET | Freq: Once | ORAL | Status: AC | PRN
  Administered 2024-09-14: 5 mg via ORAL

## 2024-09-14 MED ORDER — ROCURONIUM BROMIDE 10 MG/ML (PF) SYRINGE
PREFILLED_SYRINGE | INTRAVENOUS | Status: DC | PRN
  Administered 2024-09-14: 60 mg via INTRAVENOUS
  Administered 2024-09-14: 20 mg via INTRAVENOUS

## 2024-09-14 MED ORDER — SODIUM CHLORIDE 0.9% FLUSH: 3.0000 mL | Freq: Two times a day (BID) | INTRAVENOUS | Status: DC

## 2024-09-14 MED ORDER — SODIUM CHLORIDE 0.9 % IV SOLN: 250.0000 mL | INTRAVENOUS | Status: DC | PRN

## 2024-09-14 MED ORDER — ACETAMINOPHEN 500 MG PO TABS
1000.0000 mg | ORAL_TABLET | Freq: Once | ORAL | Status: AC
  Administered 2024-09-14: 1000 mg via ORAL
  Filled 2024-09-14: qty 2

## 2024-09-14 MED ORDER — PNEUMOCOCCAL 20-VAL CONJ VACC 0.5 ML IM SUSY
0.5000 mL | PREFILLED_SYRINGE | INTRAMUSCULAR | Status: AC
  Administered 2024-09-14: 0.5 mL via INTRAMUSCULAR
  Filled 2024-09-14: qty 0.5

## 2024-09-14 MED ORDER — CEFAZOLIN SODIUM-DEXTROSE 2-4 GM/100ML-% IV SOLN
2.0000 g | INTRAVENOUS | Status: AC
  Administered 2024-09-14: 2 g via INTRAVENOUS
  Filled 2024-09-14: qty 100

## 2024-09-14 MED ORDER — ONDANSETRON HCL 4 MG/2ML IJ SOLN: 4.0000 mg | Freq: Four times a day (QID) | INTRAMUSCULAR | Status: DC | PRN

## 2024-09-14 MED ORDER — CHLORHEXIDINE GLUCONATE 4 % EX SOLN: Freq: Once | CUTANEOUS | Status: DC

## 2024-09-14 MED ORDER — PROTAMINE SULFATE 10 MG/ML IV SOLN
INTRAVENOUS | Status: DC | PRN
  Administered 2024-09-14: 35 mg via INTRAVENOUS

## 2024-09-14 MED ORDER — CHLORHEXIDINE GLUCONATE 0.12 % MT SOLN: 15.0000 mL | Freq: Once | OROMUCOSAL | Status: AC

## 2024-09-14 MED ORDER — INSULIN ASPART 100 UNIT/ML IJ SOLN: 0.0000 [IU] | INTRAMUSCULAR | Status: DC | PRN

## 2024-09-14 MED ORDER — HEPARIN (PORCINE) IN NACL 2000-0.9 UNIT/L-% IV SOLN
INTRAVENOUS | Status: DC | PRN
  Administered 2024-09-14: 1000 mL

## 2024-09-14 MED ORDER — OXYCODONE-ACETAMINOPHEN 5-325 MG PO TABS
ORAL_TABLET | ORAL | Status: AC
  Filled 2024-09-14: qty 1

## 2024-09-14 MED ORDER — OXYCODONE HCL 5 MG PO TABS
ORAL_TABLET | ORAL | Status: AC
  Filled 2024-09-14: qty 1

## 2024-09-14 MED ORDER — IOHEXOL 350 MG/ML SOLN
INTRAVENOUS | Status: DC | PRN
  Administered 2024-09-14: 15 mL

## 2024-09-14 MED ORDER — SODIUM CHLORIDE 0.9 % IV SOLN: INTRAVENOUS | Status: DC

## 2024-09-14 MED ORDER — ONDANSETRON HCL 4 MG/2ML IJ SOLN
INTRAMUSCULAR | Status: DC | PRN
  Administered 2024-09-14: 4 mg via INTRAVENOUS

## 2024-09-14 MED ORDER — CEFAZOLIN SODIUM-DEXTROSE 2-3 GM-%(50ML) IV SOLR: INTRAVENOUS | Status: DC | PRN

## 2024-09-14 MED ORDER — HEPARIN SODIUM (PORCINE) 1000 UNIT/ML IJ SOLN
INTRAMUSCULAR | Status: DC | PRN
  Administered 2024-09-14: 14000 [IU] via INTRAVENOUS
  Administered 2024-09-14: 1000 [IU] via INTRAVENOUS

## 2024-09-14 MED ORDER — OXYCODONE HCL 5 MG/5ML PO SOLN
5.0000 mg | Freq: Once | ORAL | Status: AC | PRN
  Filled 2024-09-14: qty 5

## 2024-09-14 MED ORDER — LACTATED RINGERS IV SOLN: INTRAVENOUS | Status: DC

## 2024-09-14 MED ORDER — APIXABAN 5 MG PO TABS
5.0000 mg | ORAL_TABLET | Freq: Once | ORAL | Status: AC
Start: 2024-09-14 — End: 2024-09-14
  Administered 2024-09-14: 5 mg via ORAL
  Filled 2024-09-14: qty 1

## 2024-09-14 MED ORDER — FENTANYL CITRATE (PF) 100 MCG/2ML IJ SOLN
INTRAMUSCULAR | Status: AC
  Filled 2024-09-14: qty 2

## 2024-09-14 MED ORDER — FENTANYL CITRATE (PF) 250 MCG/5ML IJ SOLN
INTRAMUSCULAR | Status: DC | PRN
  Administered 2024-09-14: 100 ug via INTRAVENOUS

## 2024-09-14 MED ORDER — CHLORHEXIDINE GLUCONATE 0.12 % MT SOLN
OROMUCOSAL | Status: AC
  Administered 2024-09-14: 15 mL via OROMUCOSAL
  Filled 2024-09-14: qty 15

## 2024-09-14 MED ORDER — SUGAMMADEX SODIUM 200 MG/2ML IV SOLN
INTRAVENOUS | Status: DC | PRN
  Administered 2024-09-14: 350 mg via INTRAVENOUS

## 2024-09-14 MED ORDER — ACETAMINOPHEN 325 MG PO TABS
650.0000 mg | ORAL_TABLET | ORAL | Status: DC | PRN
  Administered 2024-09-14: 650 mg via ORAL
  Filled 2024-09-14: qty 2

## 2024-09-14 MED ORDER — INFLUENZA VIRUS VACC SPLIT PF (FLUZONE) 0.5 ML IM SUSY
0.5000 mL | PREFILLED_SYRINGE | INTRAMUSCULAR | Status: AC
  Administered 2024-09-14: 0.5 mL via INTRAMUSCULAR
  Filled 2024-09-14 (×2): qty 0.5

## 2024-09-14 MED ORDER — PNEUMOCOCCAL 20-VAL CONJ VACC 0.5 ML IM SUSY: 0.5000 mL | PREFILLED_SYRINGE | INTRAMUSCULAR | Status: DC

## 2024-09-14 MED ORDER — PROPOFOL 10 MG/ML IV BOLUS
INTRAVENOUS | Status: DC | PRN
  Administered 2024-09-14: 80 mg via INTRAVENOUS

## 2024-09-14 MED ORDER — MIDAZOLAM HCL 2 MG/2ML IJ SOLN
INTRAMUSCULAR | Status: AC
  Filled 2024-09-14: qty 2

## 2024-09-14 MED ORDER — MIDAZOLAM HCL 2 MG/2ML IJ SOLN
INTRAMUSCULAR | Status: DC | PRN
  Administered 2024-09-14: 2 mg via INTRAVENOUS

## 2024-09-14 MED ORDER — PHENYLEPHRINE HCL-NACL 20-0.9 MG/250ML-% IV SOLN
INTRAVENOUS | Status: DC | PRN
  Administered 2024-09-14: 40 ug/min via INTRAVENOUS

## 2024-09-14 MED ORDER — PHENYLEPHRINE 80 MCG/ML (10ML) SYRINGE FOR IV PUSH (FOR BLOOD PRESSURE SUPPORT)
PREFILLED_SYRINGE | INTRAVENOUS | Status: DC | PRN
  Administered 2024-09-14: 80 ug via INTRAVENOUS
  Administered 2024-09-14: 160 ug via INTRAVENOUS
  Administered 2024-09-14: 80 ug via INTRAVENOUS

## 2024-09-14 SURGICAL SUPPLY — 18 items
BLANKET WARM UNDERBOD FULL ACC (MISCELLANEOUS) ×2 IMPLANT
CATH INFINITI 5FR ANG PIGTAIL (CATHETERS) IMPLANT
CATH WATCHMAN STEER ACCESS SYS (CATHETERS) IMPLANT
CLOSURE PERCLOSE PROSTYLE (VASCULAR PRODUCTS) IMPLANT
DEVICE WATCHMAN FLX PRO PROC (KITS) IMPLANT
DEVICE WATCHMAN TRUSTEER PROC (KITS) IMPLANT
KIT VERSACROSS CON 12FR 85 (KITS) IMPLANT
PACK CARDIAC CATHETERIZATION (CUSTOM PROCEDURE TRAY) ×2 IMPLANT
PAD DEFIB RADIO PHYSIO CONN (PAD) ×2 IMPLANT
SHEATH DILAT COONS TAPER 22F (SHEATH) IMPLANT
SHEATH PERFORMER 18FRX30 (VASCULAR PRODUCTS) IMPLANT
SHEATH PINNACLE 8F 10CM (SHEATH) IMPLANT
SHEATH PROBE COVER 6X72 (BAG) ×2 IMPLANT
SHIELD RADPAD SCOOP 12X17 (MISCELLANEOUS) ×2 IMPLANT
TRANSDUCER W/STOPCOCK (MISCELLANEOUS) ×2 IMPLANT
TUBING ART PRESS 72 MALE/FEM (TUBING) IMPLANT
TUBING CIL FLEX 10 FLL-RA (TUBING) ×2 IMPLANT
WATCHMAN FLX PRO 24 (Prosthesis & Implant Heart) IMPLANT

## 2024-09-14 NOTE — Discharge Instructions (Addendum)
 WATCHMANT Procedure, Care After  Procedure MD: Dr. Alene Mires Clinical Coordinator: Karsten Fells, RN  This sheet gives you information about how to care for yourself after your procedure. Your health care provider may also give you more specific instructions. If you have problems or questions, contact your health care provider.  What can I expect after the procedure? After the procedure, it is common to have: Bruising around your puncture site. Tenderness around your puncture site. Tiredness (fatigue).  Medication instructions It is very important to continue to take your blood thinner as directed by your doctor after the Watchman procedure. Call your procedure doctor's office with question or concerns. If you are on Coumadin (warfarin), you will have your INR checked the week after your procedure, with a goal INR of 2.0 - 3.0. Please follow your medication instructions on your discharge summary. Only take the medications listed on your discharge paperwork.  Follow up You will be seen in 6 weeks after your procedure You will have a repeat CT scan or Echocardiogram approximately 8 weeks after your procedure mark to check your device You will follow up the MD/APP who performed your procedure 6 months after your procedure The Watchman Clinical Coordinator will check in with you from time to time, including 1 and 2 years after your procedure.  NO DENTAL CLEANINGS FOR 45 days. After that, you will require antibiotics for dental procedures the first 6 months.   Follow these instructions at home: Puncture site care  Follow instructions from your health care provider about how to take care of your puncture site. Make sure you: If present, leave stitches (sutures), skin glue, or adhesive strips in place.  If a large square bandage is present, this may be removed 24 hours after surgery.  Check your puncture site every day for signs of infection. Check for: Redness, swelling, or pain. Fluid  or blood. If your puncture site starts to bleed, lie down on your back, apply firm pressure to the area, and contact your health care provider. Warmth. Pus or a bad smell. Driving Do not drive yourself home if you received sedation Do not drive for at least 4 days after your procedure or however long your health care provider recommends. (Do not resume driving if you have previously been instructed not to drive for other health reasons.) Do not spend greater than 1 hour at a time in a car for the first 3 days. Stop and take a break with a 5 minute walk at least every hour.  Do not drive or use heavy machinery while taking prescription pain medicine.  Activity Avoid activities that take a lot of effort, including exercise, for at least 7 days after your procedure. For the first 3 days, avoid sitting for longer than one hour at a time.  Avoid alcoholic beverages, signing paperwork, or participating in legal proceedings for 24 hours after receiving sedation Do not lift anything that is heavier than 10 lb (4.5 kg) for one week.  No sexual activity for 1 week.  Return to your normal activities as told by your health care provider. Ask your health care provider what activities are safe for you. General instructions Take over-the-counter and prescription medicines only as told by your health care provider. Do not use any products that contain nicotine or tobacco, such as cigarettes and e-cigarettes. If you need help quitting, ask your health care provider. You may shower after 24 hours, but Do not take baths, swim, or use a hot tub for  1 week.  Do not drink alcohol for 24 hours after your procedure. Keep all follow-up visits as told by your health care provider. This is important. Dental Work: You will require antibiotics prior to any dental work, including cleanings, for 6 months after your Watchman implantation to help protect you from infection. After 6 months, antibiotics are no longer  required. Contact a health care provider if: You have redness, mild swelling, or pain around your puncture site. You have soreness in your throat or at your puncture site that does not improve after several days You have fluid or blood coming from your puncture site that stops after applying firm pressure to the area. Your puncture site feels warm to the touch. You have pus or a bad smell coming from your puncture site. You have a fever. You have chest pain or discomfort that spreads to your neck, jaw, or arm. You are sweating a lot. You feel nauseous. You have a fast or irregular heartbeat. You have shortness of breath. You are dizzy or light-headed and feel the need to lie down. You have pain or numbness in the arm or leg closest to your puncture site. Get help right away if: Your puncture site suddenly swells. Your puncture site is bleeding and the bleeding does not stop after applying firm pressure to the area. These symptoms may represent a serious problem that is an emergency. Do not wait to see if the symptoms will go away. Get medical help right away. Call your local emergency services (911 in the [place] Do not drive yourself to the hospital. Summary After the procedure, it is normal to have bruising and tenderness at the puncture site in your groin, neck, or forearm. Check your puncture site every day for signs of infection. Get help right away if your puncture site is bleeding and the bleeding does not stop after applying firm pressure to the area. This is a medical emergency.  This information is not intended to replace advice given to you by your health care provider. Make sure you discuss any questions you have with your health care provider.

## 2024-09-14 NOTE — Anesthesia Postprocedure Evaluation (Signed)
 Anesthesia Post Note  Patient: Darrell Hauk  Procedure(s) Performed: LEFT ATRIAL APPENDAGE OCCLUSION TRANSESOPHAGEAL ECHOCARDIOGRAM     Patient location during evaluation: PACU Anesthesia Type: General Level of consciousness: awake and alert, oriented and patient cooperative Pain management: pain level controlled Vital Signs Assessment: post-procedure vital signs reviewed and stable Respiratory status: spontaneous breathing, nonlabored ventilation, respiratory function stable and patient connected to nasal cannula oxygen Cardiovascular status: blood pressure returned to baseline and stable Postop Assessment: no apparent nausea or vomiting Anesthetic complications: no Comments: Pt c/o sore throat, swallowing OK, voice clear   There were no known notable events for this encounter.  Last Vitals:  Vitals:   09/14/24 0930 09/14/24 0940  BP: 135/77 (!) 144/75  Pulse: 80 80  Resp: (!) 9 14  Temp:    SpO2: 91% 95%    Last Pain:  Vitals:   09/14/24 1024  TempSrc:   PainSc: 5                  Shirlene Andaya,E. Paulette Lynch

## 2024-09-14 NOTE — Transfer of Care (Signed)
 Immediate Anesthesia Transfer of Care Note  Patient: [name] [name]  Procedure(s) Performed: LEFT ATRIAL APPENDAGE OCCLUSION TRANSESOPHAGEAL ECHOCARDIOGRAM  Patient Location: PACU  Anesthesia Type:General  Level of Consciousness: drowsy and patient cooperative  Airway & Oxygen Therapy: Patient connected to face mask oxygen  Post-op Assessment: Report given to RN and Post -op Vital signs reviewed and stable  Post vital signs: Reviewed and stable  Last Vitals:  Vitals Value Taken Time  BP 140/70   Temp    Pulse 80 09/14/24 09:21  Resp 9 09/14/24 09:20  SpO2 92 % 09/14/24 09:21  Vitals shown include unfiled device data.  Last Pain:  Vitals:   09/14/24 0617  TempSrc:   PainSc: 0-No pain         Complications: There were no known notable events for this encounter.

## 2024-09-14 NOTE — Plan of Care (Signed)
  Problem: Education: Goal: Knowledge of cardiac device and self-care will improve Outcome: Progressing   Problem: Activity: Goal: Risk for activity intolerance will decrease Outcome: Progressing   Problem: Pain Managment: Goal: General experience of comfort will improve and/or be controlled Outcome: Progressing   Problem: Safety: Goal: Ability to remain free from injury will improve Outcome: Progressing

## 2024-09-14 NOTE — Anesthesia Procedure Notes (Addendum)
 Procedure Name: Intubation Date/Time: 09/14/2024 7:51 AM  Performed by: Christopher Comings, CRNAPre-anesthesia Checklist: Patient identified, Emergency Drugs available, Suction available and Patient being monitored Patient Re-evaluated:Patient Re-evaluated prior to induction Oxygen Delivery Method: Circle system utilized Preoxygenation: Pre-oxygenation with 100% oxygen Induction Type: IV induction Ventilation: Mask ventilation without difficulty and Oral airway inserted - appropriate to patient size Laryngoscope Size: Mac and 4 Grade View: Grade II Tube type: Oral Tube size: 7.5 mm Number of attempts: 1 Airway Equipment and Method: Stylet and Oral airway Placement Confirmation: ETT inserted through vocal cords under direct vision, positive ETCO2 and breath sounds checked- equal and bilateral Secured at: 23 cm Tube secured with: Tape Dental Injury: Teeth and Oropharynx as per pre-operative assessment

## 2024-09-14 NOTE — H&P (Signed)
 Electrophysiology Note:   Date:  09/14/24  ID:  [name] [name], DOB Dec 19, 1961, MRN [id number]   Primary Cardiologist: [name] Fell, MD Electrophysiologist: [name] Kitty, MD       History of Present Illness:   [name] [name] is a 63 y.o. male with h/o CAD s/p inferior lateral STEMI 06/2023 with PCI to circumflex and pLAD PCI 11/10/23, paroxysmal atrial fibrillation s/p ablation 02/01/24, hypertension, hyperlipidemia, diabetes, OSA  who is being seen today for evaluation for Watchman device implant at the request of [name] Passey, PA.   Discussed the use of AI scribe software for clinical note transcription with the patient, who gave verbal consent to proceed.   History of Present Illness He has experienced severe episodes of atrial fibrillation characterized by sensations of 'fluttering' and 'skipping', accompanied by lightheadedness and near syncope on two occasions. He does not use a Kardia device but relies on a blood pressure cuff, which shows an error code during these episodes. His symptoms have improved over the past two to three weeks.   He is currently taking carvedilol , 25 mg twice daily, which he has doubled due to elevated blood pressure following a bout of pneumonia. Additionally, he has increased his losartan  dose to 75 mg at night, but his blood pressure remains elevated at 181/104 mmHg, compared to previous readings in the 120s/70s before his illness.   He is on Eliquis  for stroke prevention due to atrial fibrillation and has experienced prolonged bleeding from cuts and following injection of his subcutaneous insulin , which he attributes to the anticoagulant. He also takes Plavix  for stents placed in July and November of the previous year.    Interval: Patient presents today for scheduled Watchman implant. Patient reports feeling relatively well. No new or acute complaints.   Review of systems complete and found to be negative unless listed in HPI.    EP Information / Studies  Reviewed:     EKG is not ordered today. EKG from 05/02/24 reviewed which showed sinus rhythm        Echo 07/26/23:  Low normal LV systolic function.  LVEF 50 to 55%.  Mild concentric LVH.  Grade 1 diastolic dysfunction. Normal RV size and function. No significant valvular disease. Left and right atrium are normal in size.   Zio Monitor 08/2023 Worn for only 3 days. 5 total AF episodes.    Risk Assessment/Calculations:     CHA2DS2-VASc Score = 3   This indicates a 3.2% annual risk of stroke. The patient's score is based upon: CHF History: 0 HTN History: 1 Diabetes History: 1 Stroke History: 0 Vascular Disease History: 1 Age Score: 0 Gender Score: 0       Physical Exam:    Today's Vitals   09/14/24 0542 09/14/24 0613 09/14/24 0617  BP: 132/80    Pulse: 76    Resp: 17    Temp: 98 F (36.7 C)    TempSrc: Oral    SpO2: 95%    Weight: 86.2 kg    Height: 5' 10 (1.778 m)    PainSc:  0-No pain 0-No pain   Body mass index is 27.26 kg/m.   GEN: Well nourished, well developed in no acute distress NECK: No JVD CARDIAC: Normal rate, regular rhythm RESPIRATORY:  Clear to auscultation without rales, wheezing or rhonchi  ABDOMEN: Soft, non-distended EXTREMITIES:  No edema; No deformity    ASSESSMENT AND PLAN:     I have seen [name] [name] in the office today who is being considered for  a Watchman left atrial appendage closure device. I believe they will benefit from this procedure given their history of atrial fibrillation, CHA2DS2-VASc score of 3 and unadjusted ischemic stroke rate of 3.2% per year. Unfortunately, the patient is not felt to be a long term anticoagulation candidate secondary to bleeding. The patient's chart has been reviewed and I feel that they would be a candidate for short term oral anticoagulation after Watchman implant.    It is my belief that after undergoing a LAA closure procedure, [name] [name] will not need long term anticoagulation which  eliminates anticoagulation side effects and major bleeding risk.    Procedural risks for the Watchman implant have been reviewed with the patient including a 0.5% risk of stroke, <1% risk of perforation and <1% risk of device embolization. Other risks include bleeding, vascular damage, tamponade, worsening renal function, and death. The patient understands these risk and wishes to proceed.       The published clinical data on the safety and effectiveness of WATCHMAN include but are not limited to the following: - Holmes DR, Jess BEARD, Sick P et al. for the PROTECT AF Investigators. Percutaneous closure of the left atrial appendage versus warfarin therapy for prevention of stroke in patients with atrial fibrillation: a randomised non-inferiority trial. Lancet 2009; 374: 534-42. GLENWOOD Jess BEARD, Doshi SK, Jonita VEAR Satchel D et al. on behalf of the PROTECT AF Investigators. Percutaneous Left Atrial Appendage Closure for Stroke Prophylaxis in Patients With Atrial Fibrillation 2.3-Year Follow-up of the PROTECT AF (Watchman Left Atrial Appendage System for Embolic Protection in Patients With Atrial Fibrillation) Trial. Circulation 2013; 127:720-729. - Alli O, Doshi S,  Kar S, Reddy VY, Sievert H et al. Quality of Life Assessment in the Randomized PROTECT AF (Percutaneous Closure of the Left Atrial Appendage Versus Warfarin Therapy for Prevention of Stroke in Patients With Atrial Fibrillation) Trial of Patients at Risk for Stroke With Nonvalvular Atrial Fibrillation. J Am Coll Cardiol 2013; 61:1790-8. GLENWOOD Satchel DR, Archer RAMAN, Price M, Whisenant B, Sievert H, Doshi S, Huber K, Reddy V. Prospective randomized evaluation of the Watchman left atrial appendage Device in patients with atrial fibrillation versus long-term warfarin therapy; the PREVAIL trial. Journal of the [employer] of Cardiology, Vol. 4, No. 1, 2014, 1-11. - Kar S, Doshi SK, Sadhu A, Horton R, Osorio J et al. Primary outcome evaluation of a  next-generation left atrial appendage closure device: results from the PINNACLE FLX trial. Circulation 2021;143(18)1754-1762.      HAS-BLED score 2 Hypertension Yes  Abnormal renal and liver function (Dialysis, transplant, Cr >2.26 mg/dL /Cirrhosis or Bilirubin >2x Normal or AST/ALT/AP >3x Normal) No  Stroke No  Bleeding No  Labile INR (Unstable/high INR) No  Elderly (>65) No  Drugs or alcohol (>= 8 drinks/week, anti-plt or NSAID) Yes    CHA2DS2-VASc Score = 3  The patient's score is based upon: CHF History: 0 HTN History: 1 Diabetes History: 1 Stroke History: 0 Vascular Disease History: 1 Age Score: 0 Gender Score: 0         ASSESSMENT AND PLAN: #. Paroxysmal atrial fibrillation: S/p ablation 02/01/24 #. Secondary hypercoagulable state due to atrial fibrillation: -Continue Eliquis  5mg  BID for now. Watchman as above.  -Carvedilol  25mg  BID.    #. CAD: Inferior lateral STEMI 06/2023 with PCI to circumflex and pLAD PCI 11/10/23 -No chest pain currently. -Continue Plavix , statin, beta-blocker.   #. Hypertension: Recently poor controlled despite increasing carvedilol  to 50mg  BID and losartan  to 75mg  BID. - Increase losartan   to 100mg  daily, decrease carvedilol  to 25mg  BID. Recommend checking blood pressures 1-2 times per week at home and recording the values.  Recommend bringing these recordings to the primary care physician.   #. OSA: -Sleep study.   Follow up with Dr. Kennyth 3 months after [facility] implant.     Signed, [name] Kennyth, MD

## 2024-09-14 NOTE — Discharge Summary (Addendum)
 Electrophysiology Discharge Summary   Patient ID: [name] [name],  MRN: [id number], DOB/AGE: 1961/06/16 63 y.o.  Admit date: 09/14/2024 Discharge date: 09/14/2024  Primary Care Physician: Kip Righter, MD  Primary Cardiologist: Ozell Fell, MD  Electrophysiologist: Fonda Kitty, MD     Primary Discharge Diagnosis:  Paroxysmal Atrial Fibrillation Poor candidacy for long term anticoagulation due to bleeding, preference to avoid long term anticoagulation  Secondary Discharge Diagnosis:  Secondary Hypercoagulable State  CAD s/p Inferior Lateral STEMI 06/2023 s/p PCI to Cx and pLAD HTN OSA   Procedures This Admission:  Transeptal Puncture Intra-procedural TEE which showed no LAA thrombus Left atrial appendage occlusive device placement on 09/14/24 by Dr. Kitty.   This study demonstrated: 1.Successful implantation of a 24mm WATCHMAN Flx Pro left atrial appendage occlusive device    2. TEE demonstrating no LAA thrombus 3. No early apparent complications.    Post Implant Anticoagulation Strategy: Continue Eliquis  5mg  BID + ASA 81 mg for 45 days then transition to DAPT with aspirin  81mg  and Plavix  75mg  once daily. Repeat imaging with CT scan in 60 days.     Brief HPI: [name] [name] is a 63 y.o. male with a history of Paroxysmal Atrial Fibrillation who was referred to Electrophysiology in the outpatient setting    Hospital Course:  The patient was admitted and underwent left atrial appendage occlusive device placement as above.  The patient was monitored in the post-operative period and had groin pain.  He was held longer and re-examined and ultimately groin declared stable.  Groin site has been stable without evidence of hematoma or bleeding. Wound care and restrictions were reviewed with the patient.   The patient has been scheduled for post procedure follow up with EP APP in approximately 6 weeks. They will restart Eliquis  this evening + daily ASA 81 mg (recent stent,  confirmed with Dr. Kitty prior to discharge) and continue for 45 days then stop. At that time he will transition to Plavix  75mg  + ASA 81 mg daily to complete 6 months of therapy. They will require dental SBE for 6 month post op and should refrain from dental work or cleanings for the first 45 days post implant. SBE to be RXd at follow up.   A repeat CT scan will be performed in approximately 60 days to ensure proper seal of the device.    Physical Exam: Vitals:   09/14/24 1050 09/14/24 1100 09/14/24 1121 09/14/24 1606  BP: 136/78 134/78 128/74 123/76  Pulse: 77 76 77 70  Resp: 16 13 16    Temp:   98.6 F (37 C) 97.8 F (36.6 C)  TempSrc:   Oral Oral  SpO2: 95% 90% 92%   Weight:      Height:        GEN: Well nourished, well developed in no acute distress NECK: No JVD; No carotid bruits CARDIAC: Regular rate and rhythm, no murmurs, rubs, gallops RESPIRATORY:  Clear to auscultation without rales, wheezing or rhonchi  ABDOMEN: Soft, non-tender, non-distended EXTREMITIES:  No edema; No deformity. Groin site Stable     Discharge Medications:  Allergies as of 09/14/2024       Reactions   Pravastatin Other (See Comments)   Joint myalgias   Trazodone And Nefazodone    Had restless legs   Hydrochlorothiazide  Other (See Comments)   lightheadedness   Latex Rash   Metformin  And Related Diarrhea   Zetia  [ezetimibe ] Other (See Comments)   Myalgias, joint pain  Medication List     STOP taking these medications    amoxicillin -clavulanate 875-125 MG tablet Commonly known as: AUGMENTIN    ibuprofen  600 MG tablet Commonly known as: ADVIL    oxyCODONE  5 MG immediate release tablet Commonly known as: Roxicodone        TAKE these medications    acetaminophen  500 MG tablet Commonly known as: TYLENOL  Take 1 tablet (500 mg total) by mouth every 6 (six) hours. Take every 6 hrs x 2-3 days then take every 6 hrs as needed   amLODipine  5 MG tablet Commonly known as:  NORVASC  Take 1 tablet (5 mg total) by mouth daily.   apixaban  5 MG Tabs tablet Commonly known as: ELIQUIS  Take 1 tablet (5 mg total) by mouth 2 (two) times daily.   aspirin  EC 81 MG tablet Take 1 tablet (81 mg total) by mouth daily. Swallow whole. What changed: Another medication with the same name was removed. Continue taking this medication, and follow the directions you see here.   atorvastatin  40 MG tablet Commonly known as: LIPITOR Take 40 mg by mouth daily.   carvedilol  25 MG tablet Commonly known as: COREG  TAKE 1 TABLET(25 MG) BY MOUTH TWICE DAILY   Dexcom G7 Sensor Misc Change sensor every 10 days   Dexcom G7 Sensor Misc Change every 10 days   empagliflozin  25 MG Tabs tablet Commonly known as: Jardiance  Take 1 tablet (25 mg total) by mouth daily before breakfast.   HumaLOG  KwikPen 200 UNIT/ML KwikPen Generic drug: insulin  lispro Max daily 120 units What changed:  how much to take how to take this when to take this additional instructions   Insulin  Pen Needle 32G X 4 MM Misc 1 Device by Does not apply route in the morning, at noon, in the evening, and at bedtime.   Unifine Pentips 32G X 4 MM Misc Generic drug: Insulin  Pen Needle 1 Device by Other route in the morning, at noon, in the evening, and at bedtime.   ipratropium 0.03 % nasal spray Commonly known as: ATROVENT  Place 2 sprays into both nostrils 3 (three) times daily as needed for rhinitis.   Lantus  SoloStar 100 UNIT/ML Solostar Pen Generic drug: insulin  glargine Inject 50 Units into the skin daily. What changed:  how much to take when to take this   levocetirizine 5 MG tablet Commonly known as: Xyzal  Allergy  24HR Take 1 tablet (5 mg total) by mouth every evening.   losartan  100 MG tablet Commonly known as: COZAAR  Take 1 tablet (100 mg total) by mouth daily.   nitroGLYCERIN  0.4 MG SL tablet Commonly known as: NITROSTAT  Place 1 tablet (0.4 mg total) under the tongue every 5 (five) minutes  as needed for chest pain.   pantoprazole  40 MG tablet Commonly known as: PROTONIX  Take 1 tablet (40 mg total) by mouth daily.   Repatha  SureClick 140 MG/ML Soaj Generic drug: Evolocumab  ADMINISTER 1 ML UNDER THE SKIN EVERY 14 DAYS.   rOPINIRole  2 MG tablet Commonly known as: REQUIP  Take 2 mg by mouth at bedtime.   Semaglutide  (2 MG/DOSE) 8 MG/3ML Sopn Inject 2 mg as directed once a week. What changed: when to take this   tamsulosin  0.4 MG Caps capsule Commonly known as: FLOMAX  Take 0.4 mg by mouth at bedtime.   triamcinolone  cream 0.1 % Commonly known as: KENALOG  Apply 1 Application topically 2 (two) times daily. What changed:  when to take this reasons to take this   Trintellix  20 MG Tabs tablet Generic drug: vortioxetine  HBr Take  1 tablet (20 mg total) by mouth daily.        Disposition:  Home with usual follow up as in AVS  Signed, Daphne Barrack, NP-C, AGACNP-BC [facility] - Electrophysiology  09/14/2024, 6:13 PM   I have seen, examined the patient, and reviewed the above assessment and plan.    Hospital Course:  [name] [name] is a 63 y.o. male with h/o CAD s/p inferior lateral STEMI 06/2023 with PCI to circumflex and pLAD PCI 11/10/23, paroxysmal atrial fibrillation s/p ablation 02/01/24, hypertension, hyperlipidemia, diabetes, OSA who presented on 9/18 for planned Watchman implant. He underwent successful implant of a 24mm Watchman Flx Pro device.  He underwent usual post procedure monitoring with no acute complications.  He reported feeling relatively well and was discharged with follow-up arranged.  General: Well developed, in no acute distress.  Neck: No JVD.  Cardiac: Normal rate, regular rhythm.  Resp: Normal work of breathing.  Ext: No edema.  Groins: Soft bilaterally without bleeding or hematoma. Neuro: No gross focal deficits.  Psych: Normal affect.   Assessment and Plan: Paroxysmal atrial fibrillation s/p Watchman device -Continue  Eliquis  5mg  BID for 45 days then transition to DAPT with aspirin  81mg  and Plavix  75mg  once daily. Repeat imaging with CT scan in 60 days.   Duration of Discharge Encounter: 45 minutes  Fonda Kitty, MD 09/17/2024 3:18 PM

## 2024-09-14 NOTE — TOC CM/SW Note (Signed)
 Transition of Care [facility]) - Inpatient Brief Assessment   Patient Details  Name: [name] [name] MRN: [id number] Date of Birth: [birth year]  [facility] CM/SW Contact:    Lauraine FORBES Saa, LCSWA Phone Number: 09/14/2024, 3:41 PM   Clinical Narrative:  3:41 PM Per chart review, patient resides at home alone. Patient has a PCP and insurance. Patient does not have SNF or HH history. Patient has DME (crutches) history. Patient's preferred pharmacy's are [facility] [address] [facility] [address] [place], [facility] [address], [facility]) [place]. CSW provided SDOH (transportation, utilities) resources. Patient stated that he uses Medicaid transportation to attend appointments. No TOC needs identified at this time.  TOC will continue to follow and be available to assist.  Transition of Care Asessment: Insurance and Status: Insurance coverage has been reviewed Patient has primary care physician: Yes Home environment has been reviewed: Private Residence Prior level of function:: N/A Prior/Current Home Services: No current home services Social Drivers of Health Review: SDOH reviewed interventions complete Readmission risk has been reviewed: Yes (Currently Green 7%) Transition of care needs: no transition of care needs at this time

## 2024-09-15 ENCOUNTER — Encounter (HOSPITAL_COMMUNITY): Payer: Self-pay | Admitting: Cardiology

## 2024-09-15 MED FILL — Fentanyl Citrate Preservative Free (PF) Inj 100 MCG/2ML: INTRAMUSCULAR | Qty: 2 | Status: AC

## 2024-09-18 ENCOUNTER — Encounter (HOSPITAL_COMMUNITY): Payer: Self-pay | Admitting: Radiology

## 2024-09-18 ENCOUNTER — Telehealth: Payer: Self-pay

## 2024-09-18 DIAGNOSIS — Z95818 Presence of other cardiac implants and grafts: Secondary | ICD-10-CM

## 2024-09-18 DIAGNOSIS — I480 Paroxysmal atrial fibrillation: Secondary | ICD-10-CM

## 2024-09-18 NOTE — Telephone Encounter (Signed)
  [facility]   Watchman Team  Contacted the patient regarding discharge from [facility] on 09/14/2024  The patient understands to follow up with Jodie Passey on 10/31/2024  The patient understands discharge instructions? Yes  The patient understands medications and regimen? Yes   The patient reports groin site looks healthy with no S/S of bleeding or infection   The patient understands to call with any questions or concerns prior to scheduled visit.

## 2024-09-20 ENCOUNTER — Other Ambulatory Visit (HOSPITAL_COMMUNITY): Payer: Self-pay

## 2024-10-10 NOTE — Progress Notes (Unsigned)
 Provider:  Dedra Gores, MD  Primary Care Physician:  Kip Righter, MD [address] [place] [address]     Referring Provider: Kip Righter, Md [address] [place],  [place] [address]          Chief Complaint according to patient   Patient presents with:                HISTORY OF PRESENT ILLNESS:  [name] [name] is a 63 y.o. male patient who is here for OSA revisit 10/11/2024 for  activation of newly implanted INSPIRE device surgeon was Dr .Okey. Implantation 08-09-2024,  he had a complication post surgery ,  severe bruising.  He is chronically anticoagulated due to paroxysmal  atrial fib, had a transoesophageal Echo on 09-14-2024,  for a watchman procedure.  09-14-2024.  Was on eloquis, but stopped plavix  before.  He is diabetic. He has a BMI of 28. Large neck, small upper airway.     Chief concern according to patient :  Here for activation. Starting on level  1 today,  0.7 volts, instructions given for weekly small step increases in voltage to 1.7 Volts.     ENT surgery referral for better airway patency, was helping PAP tolerance, to address sinus disease and nasal septum. HST was positive for OSA, not REM sleep dependent and without associated hypoxia, allowing alternative therapies  to PAP to be considered.  We did not obtain an in lab study.  His implantation date was on 08-09-2024.  Interval history:  this patient requested ENT referral for Inspire ,  has not been trying CPAP/ BiPAP since 2020, due to sinusitis.  Still having unresolved  air way patency issues,  watch man procedure for atrial fib,  He had 2 times atypical pneumonia since Spring 2025.    He owns a BiPAP machine at home , not used for years. He wanted to donate the device.   Review of Systems: Out of a complete 14 system review, the patient complains of only the following symptoms, and all other reviewed systems are negative.:        SLEEPINESS ?  How likely are you to doze in the following situations: 0 = not likely, 1 = slight chance, 2 = moderate chance, 3 = high chance  Sitting and Reading? Watching Television? Sitting inactive in a public place (theater or meeting)? Lying down in the afternoon when circumstances permit? Sitting and talking to someone? Sitting quietly after lunch without alcohol? In a car, while stopped for a few minutes in traffic? As a passenger in a car for an hour without a break?  Total = 15/ 24    FSS : 60/ 63        Social History   Socioeconomic History   Marital status: Single    Spouse name: Not on file   Number of children: Not on file   Years of education: Not on file   Highest education level: Not on file  Occupational History   Not on file  Tobacco Use   Smoking status: Former    Current packs/day: 0.00    Average packs/day: 1 pack/day for 10.0 years (10.0 ttl pk-yrs)    Types: Cigarettes    Start date: 04/20/1982    Quit date: 04/20/1992    Years since quitting: 32.4   Smokeless tobacco: Never  Vaping Use   Vaping status: Never Used  Substance and  Sexual Activity   Alcohol use: No   Drug use: No   Sexual activity: Yes  Other Topics Concern   Not on file  Social History Narrative   Not on file   Social Drivers of Health   Financial Resource Strain: Not on file  Food Insecurity: No Food Insecurity (09/14/2024)   Hunger Vital Sign    Worried About Running Out of Food in the Last Year: Never true    Ran Out of Food in the Last Year: Never true  Transportation Needs: Unmet Transportation Needs (09/14/2024)   PRAPARE - Administrator, Civil Service (Medical): Yes    Lack of Transportation (Non-Medical): No  Physical Activity: Unknown (03/20/2019)   Exercise Vital Sign    Days of Exercise per Week: 0 days    Minutes of Exercise per Session: Not on file  Stress: Not on file  Social Connections: Unknown (05/11/2022)   Received from  [facility]   Social Network    Social Network: Not on file    Family History  Problem Relation Age of Onset   Cancer Mother        lung   Cancer Father        prostate, mets to bone, lung   Cancer Brother        melanoma   Heart disease Maternal Grandmother        MI   Lung disease Maternal Grandfather        black lung   Heart disease Paternal Grandmother    Stroke Paternal Grandmother    Heart disease Paternal Grandfather    Heart disease Brother 38       MI   Diabetes Brother     Past Medical History:  Diagnosis Date   ADHD (attention deficit hyperactivity disorder)    Anxiety    Coronary artery disease    06/2023 - PCI to circumflex and pLAD PCI 11/10/23   Depression    Diabetes mellitus    Type 2   Dyspnea    Dysrhythmia    A. 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  [place], [place]   CORONARY STENT INTERVENTION N/A 11/10/2023   Procedure: CORONARY STENT INTERVENTION;  Surgeon: Wonda Sharper, MD;  Location: [facility] INVASIVE CV LAB;  Service: Cardiovascular;  Laterality: N/A;   CORONARY ULTRASOUND/IVUS N/A 11/10/2023   Procedure: Coronary  Ultrasound/IVUS;  Surgeon: Wonda Sharper, MD;  Location: [facility] INVASIVE CV LAB;  Service: Cardiovascular;  Laterality: N/A;   CORONARY/GRAFT ACUTE MI REVASCULARIZATION N/A 07/25/2023   Procedure: Coronary/Graft Acute MI  Revascularization;  Surgeon: Wonda Sharper, MD;  Location: [facility] INVASIVE CV LAB;  Service: Cardiovascular;  Laterality: N/A;   DRUG INDUCED ENDOSCOPY N/A 05/10/2024   Procedure: DRUG INDUCED SLEEP ENDOSCOPY;  Surgeon: Okey Burns, MD;  Location: [facility] OR;  Service: ENT;  Laterality: N/A;   IMPLANTATION OF HYPOGLOSSAL NERVE STIMULATOR N/A 08/09/2024   Procedure: INSERTION, HYPOGLOSSAL NERVE STIMULATOR;  Surgeon: Okey Burns, MD;  Location: [facility] OR;  Service: ENT;  Laterality: N/A;   KNEE ARTHROSCOPY Left    LEFT ATRIAL APPENDAGE OCCLUSION N/A 09/14/2024   Procedure: LEFT ATRIAL APPENDAGE OCCLUSION;  Surgeon: Kennyth Chew, MD;  Location: [facility] INVASIVE CV LAB;  Service: Cardiovascular;  Laterality: N/A;   LEFT HEART CATH AND CORONARY ANGIOGRAPHY N/A 07/25/2023   Procedure: LEFT HEART CATH AND CORONARY ANGIOGRAPHY;  Surgeon: Wonda Sharper, MD;  Location: [facility] INVASIVE CV LAB;  Service: Cardiovascular;  Laterality: N/A;   RENAL ANGIOGRAPHY N/A 07/16/2020   Procedure: RENAL ANGIOGRAPHY;  Surgeon: Ladona Heinz, MD;  Location: [facility] INVASIVE CV LAB;  Service: Cardiovascular;  Laterality: N/A;   SACROILIAC JOINT FUSION Right 05/02/2014   Procedure: SACROILIAC JOINT FUSION;  Surgeon: Oneil Rodgers Priestly, MD;  Location: [facility] OR;  Service: Orthopedics;  Laterality: Right;  Right sided sacroiliac joint fusion   TONSILLECTOMY  2013    uvula tacked up per pt   TRANSESOPHAGEAL ECHOCARDIOGRAM (CATH LAB) N/A 09/14/2024   Procedure: TRANSESOPHAGEAL ECHOCARDIOGRAM;  Surgeon: Kennyth Chew, MD;  Location: [facility] INVASIVE CV LAB;  Service: Cardiovascular;  Laterality: N/A;     Current Outpatient Medications on File Prior to Visit  Medication Sig Dispense Refill   acetaminophen  (TYLENOL ) 500 MG tablet  Take 1 tablet (500 mg total) by mouth every 6 (six) hours. Take every 6 hrs x 2-3 days then take every 6 hrs as needed 30 tablet 0   amLODipine  (NORVASC ) 5 MG tablet Take 1 tablet (5 mg total) by mouth daily. 90 tablet 3   apixaban  (ELIQUIS ) 5 MG TABS tablet Take 1 tablet (5 mg total) by mouth 2 (two) times daily. 180 tablet 1   aspirin  EC 81 MG tablet Take 1 tablet (81 mg total) by mouth daily. Swallow whole. 90 tablet 2   atorvastatin  (LIPITOR) 40 MG tablet Take 40 mg by mouth daily.     carvedilol  (COREG ) 25 MG tablet TAKE 1 TABLET(25 MG) BY MOUTH TWICE DAILY 180 tablet 3   Continuous Glucose Sensor (DEXCOM G7 SENSOR) MISC Change sensor every 10 days 9 each 3   Continuous Glucose Sensor (DEXCOM G7 SENSOR) MISC Change every 10 days     empagliflozin  (JARDIANCE ) 25 MG TABS tablet Take 1 tablet (25 mg total) by mouth daily before breakfast. 90 tablet 3   insulin  glargine (LANTUS  SOLOSTAR) 100 UNIT/ML Solostar Pen Inject 50 Units into the skin daily. (Patient taking differently: Inject 40 Units into the skin at bedtime.) 60 mL 3   insulin  lispro (HUMALOG  KWIKPEN) 200 UNIT/ML KwikPen Max daily 120 units (Patient taking differently: Inject 36 Units into the skin 3 (three) times daily before meals.) 60 mL 3   Insulin  Pen Needle (UNIFINE PENTIPS) 32G X 4 MM MISC 1 Device by Other route in the morning, at noon, in the evening, and at bedtime. 400 each 3   Insulin  Pen Needle 32G X 4 MM MISC 1 Device by Does not apply route in the morning, at noon, in the evening, and at bedtime. 400 each 3   ipratropium (ATROVENT ) 0.03 % nasal spray Place 2 sprays into both nostrils  3 (three) times daily as needed for rhinitis. 90 mL 3   levocetirizine (XYZAL  ALLERGY  24HR) 5 MG tablet Take 1 tablet (5 mg total) by mouth every evening. 90 tablet 3   nitroGLYCERIN  (NITROSTAT ) 0.4 MG SL tablet Place 1 tablet (0.4 mg total) under the tongue every 5 (five) minutes as needed for chest pain. 25 tablet 4   pantoprazole  (PROTONIX )  40 MG tablet Take 1 tablet (40 mg total) by mouth daily. 90 tablet 0   REPATHA  SURECLICK 140 MG/ML SOAJ ADMINISTER 1 ML UNDER THE SKIN EVERY 14 DAYS. 6 mL 3   rOPINIRole  (REQUIP ) 2 MG tablet Take 2 mg by mouth at bedtime.     Semaglutide , 2 MG/DOSE, 8 MG/3ML SOPN Inject 2 mg as directed once a week. (Patient taking differently: Inject 2 mg as directed every Saturday.) 9 mL 3   triamcinolone  cream (KENALOG ) 0.1 % Apply 1 Application topically 2 (two) times daily. (Patient taking differently: Apply 1 Application topically 2 (two) times daily as needed (rash).) 30 g 0   vortioxetine  HBr (TRINTELLIX ) 20 MG TABS tablet Take 1 tablet (20 mg total) by mouth daily. 30 tablet 0   losartan  (COZAAR ) 100 MG tablet Take 1 tablet (100 mg total) by mouth daily. 90 tablet 3   tamsulosin  (FLOMAX ) 0.4 MG CAPS capsule Take 0.4 mg by mouth at bedtime. (Patient not taking: Reported on 10/11/2024)     No current facility-administered medications on file prior to visit.    Allergies  Allergen Reactions   Pravastatin Other (See Comments)    Joint myalgias   Trazodone And Nefazodone     Had restless legs   Hydrochlorothiazide  Other (See Comments)    lightheadedness   Latex Rash   Metformin  And Related Diarrhea   Zetia  [Ezetimibe ] Other (See Comments)    Myalgias, joint pain     DIAGNOSTIC DATA (LABS, IMAGING, TESTING) - I reviewed patient records, labs, notes, testing and imaging myself where available.  Lab Results  Component Value Date   WBC 14.4 (H) 08/29/2024   HGB 15.7 08/29/2024   HCT 48.7 08/29/2024   MCV 95 08/29/2024   PLT 229 08/29/2024      Component Value Date/Time   NA 143 08/29/2024 1055   K 4.6 08/29/2024 1055   CL 106 08/29/2024 1055   CO2 21 08/29/2024 1055   GLUCOSE 108 (H) 08/29/2024 1055   GLUCOSE 134 (H) 08/03/2024 1026   BUN 27 08/29/2024 1055   CREATININE 0.98 08/29/2024 1055   CALCIUM  9.1 08/29/2024 1055   PROT 7.4 04/11/2024 1048   PROT 7.1 09/21/2023 0936   ALBUMIN  3.5 04/11/2024 1048   ALBUMIN 4.5 09/21/2023 0936   AST 25 04/11/2024 1048   ALT 27 04/11/2024 1048   ALKPHOS 62 04/11/2024 1048   BILITOT 0.5 04/11/2024 1048   BILITOT 0.4 09/21/2023 0936   GFRNONAA >60 08/03/2024 1026   GFRAA 80 10/29/2020 1333   Lab Results  Component Value Date   CHOL 53 (L) 03/13/2024   HDL 25 (L) 03/13/2024   LDLCALC 10 03/13/2024   LDLDIRECT 93 08/20/2009   TRIG 86 03/13/2024   CHOLHDL 2.1 03/13/2024   Lab Results  Component Value Date   HGBA1C 5.3 08/03/2024   Lab Results  Component Value Date   VITAMINB12 363 01/20/2011   Lab Results  Component Value Date   TSH 1.60 01/26/2024    PHYSICAL EXAM:  Vitals:   10/11/24 1055  BP: 125/78  Pulse: 83   No  data found. Body mass index is 28.07 kg/m.   Wt Readings from Last 3 Encounters:  10/11/24 195 lb 9.6 oz (88.7 kg)  09/14/24 190 lb (86.2 kg)  09/01/24 195 lb (88.5 kg)     Ht Readings from Last 3 Encounters:  10/11/24 5' 10 (1.778 m)  09/14/24 5' 10 (1.778 m)  09/01/24 5' 10 (1.778 m)      General: The patient is awake, alert and appears not in acute distress and groomed. Head: Normocephalic, atraumatic.  Neck is supple. Mallampati 3 status post UPPP ,  neck circumference:17. 75 inches .   Nasal airflow is patent.   Overbite Dwan is  seen.  This is a significant retrognathia.  Dental status:  biological Cardiovascular:  Regular rate and cardiac rhythm by pulse, without distended neck veins. Respiratory:  Lungs are clear to auscultation. Skin:  Facial reddening.  Trunk: . The patient's posture is relaxed   Neurologic exam : The patient is awake and alert, oriented to place and time.    Attention span & concentration ability appears normal.  Speech is fluent,  without dysarthria, dysphonia or aphasia.  Mood and affect are anxious, and irritable.    Cranial nerves: Pupils are equal and briskly reactive to light.  Facial motor strength is symmetric and tongue and  uvula move midline. Shoulder shrug was symmetrical.   He reports some tremors in left hand.  New onset . Side of shoulder injury, no cogwheeling. He is right handed.                           ASSESSMENT AND PLAN :   63 y.o. year old male  here with:    1) Inspire activation today , increasing voltage by 0.1 V weekly.   2) PSG titration in 8-12 weeks.   3) encourage weight loss.   Follow up after RV on 11-08-2024, 12-06-2024   With 12-12-2024 PSG.   Follow up  01-04-2024   I would like to thank Dr Okey  and Kip Righter, Md [address] [place],  [place] [address] for allowing me to meet with this pleasant patient.   Sleep Clinic Patients are generally offered input on sleep hygiene, life style changes and how to improve compliance with medical treatment where applicable. Review and reiteration of good sleep hygiene measures is offered to any sleep clinic patient, be it in the first consultation or with any follow up visits.    Any patient with sleepiness should be cautioned not to drive, work at heights, or operate dangerous or heavy equipment when feeling tired or sleepy.      The patient will be seen in follow-up in the sleep clinic at [facility] for discussion of test results, sleep related symptoms and treatment compliance review, further management strategies, etc.   The referring provider will be notified of the test results.   The patient's condition requires frequent monitoring and adjustments in the treatment plan, reflecting the ongoing complexity of care.  This provider is the continuing focal point for all needed services for this condition.  After spending a total time of  35  minutes face to face and time for  history taking, physical and neurologic examination, review of laboratory studies,  personal review of imaging studies, reports and results of other testing and review of referral information / records as far as provided in  visit,   Electronically signed by: Dedra Gores, MD 10/11/2024 11:21  AM  [facility] and [employer] certified by [employer] of Sleep Medicine and Diplomate of the [employer] of Sleep Medicine. Board certified In Neurology through the ABPN, Fellow of the [employer] of Neu[employer].

## 2024-10-11 ENCOUNTER — Ambulatory Visit: Admitting: Neurology

## 2024-10-11 ENCOUNTER — Telehealth: Payer: Self-pay | Admitting: Neurology

## 2024-10-11 ENCOUNTER — Encounter: Payer: Self-pay | Admitting: Neurology

## 2024-10-11 VITALS — BP 125/78 | HR 83 | Ht 70.0 in | Wt 195.6 lb

## 2024-10-11 DIAGNOSIS — R0602 Shortness of breath: Secondary | ICD-10-CM | POA: Diagnosis not present

## 2024-10-11 DIAGNOSIS — G4733 Obstructive sleep apnea (adult) (pediatric): Secondary | ICD-10-CM

## 2024-10-11 DIAGNOSIS — Z9682 Presence of neurostimulator: Secondary | ICD-10-CM

## 2024-10-11 DIAGNOSIS — J189 Pneumonia, unspecified organism: Secondary | ICD-10-CM | POA: Diagnosis not present

## 2024-10-11 DIAGNOSIS — Z789 Other specified health status: Secondary | ICD-10-CM

## 2024-10-11 NOTE — Patient Instructions (Signed)
   ASSESSMENT AND PLAN :    63 y.o. year old male  here with:     1) Inspire activation today , increasing voltage by 0.1 V weekly.    2) PSG titration in 8-12 weeks.    3) encourage weight loss.    Follow up after RV on 11-08-2024, 12-06-2024    With 12-12-2024 PSG.   Follow up  01-04-2024     I would like to thank Dr Okey  and Kip Righter, Md [address] [place],  [place] [address] for allowing me to meet with this pleasant patient.    Sleep Clinic Patients are generally offered input on sleep hygiene, life style changes and how to improve compliance with medical treatment where applicable. Review and reiteration of good sleep hygiene measures is offered to any sleep clinic patient, be it in the first consultation or with any follow up visits.     Any patient with sleepiness should be cautioned not to drive, work at heights, or operate dangerous or heavy equipment when feeling tired or sleepy.         The patient will be seen in follow-up in the sleep clinic at [facility] for discussion of test results, sleep related symptoms and treatment compliance review, further management strategies, etc.    The referring provider will be notified of the test results.    The patient's condition requires frequent monitoring and adjustments in the treatment plan, reflecting the ongoing complexity of care.  This provider is the continuing focal point for all needed services for this condition.

## 2024-10-11 NOTE — Telephone Encounter (Signed)
 [name]

## 2024-10-11 NOTE — Telephone Encounter (Signed)
 Patient was seen today for his inspire activation. Inspire rep Cheron was here for today visit.

## 2024-10-17 ENCOUNTER — Encounter (HOSPITAL_COMMUNITY): Payer: Self-pay

## 2024-10-17 ENCOUNTER — Ambulatory Visit (HOSPITAL_COMMUNITY): Admission: RE | Admit: 2024-10-17 | Source: Ambulatory Visit

## 2024-10-17 ENCOUNTER — Telehealth: Payer: Self-pay | Admitting: Neurology

## 2024-10-17 NOTE — Telephone Encounter (Signed)
 Patient left a voicemail. I called him back but he did not pick up. I left him another voicemail and sent a mychart.

## 2024-10-17 NOTE — Telephone Encounter (Signed)
 inspire NPSG [facility] pending

## 2024-10-19 ENCOUNTER — Ambulatory Visit (HOSPITAL_COMMUNITY)
Admission: RE | Admit: 2024-10-19 | Discharge: 2024-10-19 | Disposition: A | Discharge status: Home / Self Care | Source: Ambulatory Visit | Attending: Specialist | Admitting: Specialist

## 2024-10-19 DIAGNOSIS — M25512 Pain in left shoulder: Secondary | ICD-10-CM | POA: Diagnosis present

## 2024-10-23 NOTE — Telephone Encounter (Signed)
 Checked status on the portal it is still pending

## 2024-10-26 NOTE — Telephone Encounter (Signed)
Checked status still pending.  

## 2024-10-30 ENCOUNTER — Encounter (INDEPENDENT_AMBULATORY_CARE_PROVIDER_SITE_OTHER): Payer: Self-pay

## 2024-10-30 ENCOUNTER — Ambulatory Visit (INDEPENDENT_AMBULATORY_CARE_PROVIDER_SITE_OTHER)

## 2024-10-30 ENCOUNTER — Ambulatory Visit: Admitting: Student

## 2024-10-30 ENCOUNTER — Institutional Professional Consult (permissible substitution) (INDEPENDENT_AMBULATORY_CARE_PROVIDER_SITE_OTHER): Admitting: Otolaryngology

## 2024-10-30 VITALS — HR 76 | Ht 70.0 in | Wt 190.0 lb

## 2024-10-30 DIAGNOSIS — J321 Chronic frontal sinusitis: Secondary | ICD-10-CM

## 2024-10-30 DIAGNOSIS — J3489 Other specified disorders of nose and nasal sinuses: Secondary | ICD-10-CM

## 2024-10-30 DIAGNOSIS — R519 Headache, unspecified: Secondary | ICD-10-CM

## 2024-10-30 DIAGNOSIS — R0982 Postnasal drip: Secondary | ICD-10-CM | POA: Diagnosis not present

## 2024-10-30 DIAGNOSIS — J300 Vasomotor rhinitis: Secondary | ICD-10-CM

## 2024-10-30 DIAGNOSIS — J342 Deviated nasal septum: Secondary | ICD-10-CM | POA: Diagnosis not present

## 2024-10-30 DIAGNOSIS — J343 Hypertrophy of nasal turbinates: Secondary | ICD-10-CM | POA: Diagnosis not present

## 2024-10-30 NOTE — Progress Notes (Signed)
 Dear Dr. Kip, Here is my assessment for our mutual patient, [name] [name]. Thank you for allowing me the opportunity to care for your patient. Please do not hesitate to contact me should you have any other questions. Sincerely, Dr. Hadassah Parody  Otolaryngology Clinic Note Referring provider: Dr. Kip HPI:   Initial HPI (10/30/2024) Discussed the use of AI scribe software for clinical note transcription with the patient, who gave verbal consent to proceed.  History of Present Illness [name] [name] is a 63 year old male with a history of sleep apnea and deviated septum who presents with nasal obstruction and chronic sinus drainage.  Nasal obstruction  - Persistent nasal obstruction  - Flonase  previously used with less efficacy - Difficulty breathing through the nose, especially when congested, worse on the left but often bilateral   Rhinorrhea Post-nasal drip - Chronic anterior and posterior nasal drainage - Rhinorrhea increases upon eating and in the morning - Ipratropium nasal spray alleviates pressure but causes nasal dryness and itching  Frontal headaches  - Frequent sinus pressure and headaches particularly frontal. Frequently wakes up with headache in the morning. Similar to those experienced during severe sleep apnea - Tylenol  provides some relief    Independent Review of Additional Tests or Records:  Op note [name] [name] 08/09/24: Inspire implant   CT sinus 05/03/24 independently reviewed and shows hypoplastic sinuses, very trace thickening in bilateral maxillary sinus, all other sinuses clear, bilateral IT hypertrophy and leftward nasal septal deviation with large spur on the left    PMH/Meds/All/SocHx/FamHx/ROS:   Past Medical History:  Diagnosis Date   ADHD (attention deficit hyperactivity disorder)    Anxiety    Coronary artery disease    06/2023 - PCI to circumflex and pLAD PCI 11/10/23   Depression    Diabetes mellitus    Type 2   Dyspnea     Dysrhythmia    A. 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  [place], [place]   CORONARY STENT INTERVENTION N/A 11/10/2023   Procedure: CORONARY STENT INTERVENTION;  Surgeon: Wonda Sharper, MD;  Location: [facility] INVASIVE CV LAB;  Service: Cardiovascular;  Laterality: N/A;   CORONARY ULTRASOUND/IVUS N/A 11/10/2023   Procedure: Coronary Ultrasound/IVUS;  Surgeon: Wonda Sharper, MD;  Location: [facility] INVASIVE CV LAB;  Service: Cardiovascular;  Laterality: N/A;   CORONARY/GRAFT ACUTE MI REVASCULARIZATION N/A 07/25/2023   Procedure: Coronary/Graft Acute MI Revascularization;  Surgeon: Wonda Sharper, MD;  Location: [facility] INVASIVE CV LAB;  Service: Cardiovascular;  Laterality: N/A;   DRUG INDUCED ENDOSCOPY N/A 05/10/2024   Procedure: DRUG INDUCED SLEEP ENDOSCOPY;  Surgeon: Okey Burns, MD;  Location: [facility] OR;  Service: ENT;  Laterality: N/A;   IMPLANTATION OF HYPOGLOSSAL NERVE STIMULATOR N/A 08/09/2024   Procedure: INSERTION, HYPOGLOSSAL NERVE STIMULATOR;  Surgeon: Okey Burns, MD;  Location: [facility] OR;  Service: ENT;  Laterality: N/A;   KNEE ARTHROSCOPY Left    LEFT ATRIAL  APPENDAGE OCCLUSION N/A 09/14/2024   Procedure: LEFT ATRIAL APPENDAGE OCCLUSION;  Surgeon: Kennyth Chew, MD;  Location: [facility] INVASIVE CV LAB;  Service: Cardiovascular;  Laterality: N/A;   LEFT HEART CATH AND CORONARY ANGIOGRAPHY N/A 07/25/2023   Procedure: LEFT HEART CATH AND CORONARY ANGIOGRAPHY;  Surgeon: Wonda Sharper, MD;  Location: [facility] INVASIVE CV LAB;  Service: Cardiovascular;  Laterality: N/A;   RENAL ANGIOGRAPHY N/A 07/16/2020   Procedure: RENAL ANGIOGRAPHY;  Surgeon: Ladona Heinz, MD;  Location: [facility]VAS[facility] LAB;  Service: Cardiovascular;  Laterality: N/A;   SACROILIAC JOINT FUSION Right 05/02/2014   Procedure: SACROILIAC JOINT FUSION;  Surgeon: Oneil Rodgers Priestly, MD;  Location: [facility] OR;  Service: Orthopedics;  Laterality: Right;  Right sided sacroiliac joint fusion   TONSILLECTOMY  2013    uvula tacked up per pt   TRANSESOPHAGEAL ECHOCARDIOGRAM (CATH LAB) N/A 09/14/2024   Procedure: TRANSESOPHAGEAL ECHOCARDIOGRAM;  Surgeon: Kennyth Chew, MD;  Location: [facility] INVASIVE CV LAB;  Service: Cardiovascular;  Laterality: N/A;    Family History  Problem Relation Age of Onset   Cancer Mother        lung   Cancer Father        prostate, mets to bone, lung   Cancer Brother        melanoma   Heart disease Maternal Grandmother        MI   Lung disease Maternal Grandfather        black lung   Heart disease Paternal Grandmother    Stroke Paternal Grandmother    Heart disease Paternal Grandfather    Heart disease Brother 81       MI   Diabetes Brother      Social Connections: Unknown (05/11/2022)   Received from [employer]   Social Network    Social Network: Not on file     Current Outpatient Medications  Medication Instructions   acetaminophen  (TYLENOL ) 500 mg, Oral, Every 6 hours, Take every 6 hrs x 2-3 days then take every 6 hrs as needed   amLODipine  (NORVASC ) 5 mg, Oral, Daily   aspirin  EC 81 mg, Oral, Daily, Swallow whole.   atorvastatin  (LIPITOR) 40 mg, Daily    carvedilol  (COREG ) 25 MG tablet TAKE 1 TABLET(25 MG) BY MOUTH TWICE DAILY   cetirizine  (ZYRTEC ) 10 mg, Daily   clopidogrel  (PLAVIX ) 75 mg, Oral, Daily   Continuous Glucose Sensor (DEXCOM G7 SENSOR) MISC Change sensor every 10 days   Continuous Glucose Sensor (DEXCOM G7 SENSOR) MISC Change every 10 days   empagliflozin  (JARDIANCE ) 25 mg, Oral, Daily before breakfast   insulin  lispro (HUMALOG  KWIKPEN) 200 UNIT/ML KwikPen Max daily 120 units   Insulin  Pen Needle (UNIFINE PENTIPS) 32G X 4 MM MISC 1 Device, Other, 4 times daily   Insulin  Pen Needle 32G X 4 MM MISC 1 Device, Does not apply, 4 times daily   ipratropium (ATROVENT ) 0.03 % nasal spray 2 sprays, Each Nare, 3 times daily PRN   Lantus  SoloStar 50 Units, Subcutaneous, Daily   levocetirizine (XYZAL  ALLERGY  24HR) 5 mg, Oral, Every evening   losartan  (COZAAR ) 100 mg, Oral, Daily   nitroGLYCERIN  (NITROSTAT ) 0.4 mg, Sublingual, Every 5 min PRN  pantoprazole  (PROTONIX ) 40 mg, Oral, Daily   REPATHA  SURECLICK 140 MG/ML SOAJ ADMINISTER 1 ML UNDER THE SKIN EVERY 14 DAYS.   rOPINIRole  (REQUIP ) 2 mg, Daily at bedtime   Semaglutide  (2 MG/DOSE) 2 mg, Injection, Weekly   tamsulosin  (FLOMAX ) 0.4 mg, Daily at bedtime   triamcinolone  cream (KENALOG ) 0.1 % 1 Application, Topical, 2 times daily   Trintellix  20 mg, Oral, Daily     Physical Exam:   Pulse 76   Ht 5' 10 (1.778 m)   Wt 190 lb (86.2 kg)   SpO2 95%   BMI 27.26 kg/m   Salient findings:  CN II-XII intact Anterior rhinoscopy: Septum leftward deviated anteriorly; bilateral inferior turbinates with hypertrophy Nasal endoscopy was indicated to better evaluate the nose and paranasal sinuses, given the patient's history and exam findings, and is detailed below. No lesions of oral cavity/oropharynx No obviously palpable neck masses/lymphadenopathy/thyromegaly Inspire incision healed. Neck swelling much improved from prior based on photos pt shared with me No respiratory distress or  stridor  Seprately Identifiable Procedures:  Prior to initiating any procedures, risks/benefits/alternatives were explained to the patient and verbal consent obtained.  PROCEDURE (10/30/2024): Bilateral Diagnostic Rigid Nasal Endoscopy Pre-procedure diagnosis: Concern for nasal obstruction, nasal drainage Post-procedure diagnosis: same Indication: See pre-procedure diagnosis and physical exam above Complications: None apparent EBL: 0 mL Anesthesia: Lidocaine  4% and topical decongestant was topically sprayed in each nasal cavity  Description of Procedure:  Patient was identified. A rigid 30 degree endoscope was utilized to evaluate the sinonasal cavities, mucosa, sinus ostia and turbinates and septum.  Overall, signs of mucosal inflammation are noted.  Also noted are leftward nasal septal deviation makes it difficult to visualize left Middle meatus.  No mucopurulence, polyps, or masses noted.   Right Middle meatus: clear Right SE Recess: clear Left MM: unable to visualize as above Left SE Recess: clear Photodocumentation was obtained.  CPT CODE -- 68768 - Mod 25   Impression & Plans:  [name] [name] is a 63 y.o. male with   1. Nasal obstruction   2. Chronic frontal sinusitis   3. Nasal septal deviation   4. Hypertrophy of both inferior nasal turbinates   5. Vasomotor rhinitis   6. Chronic nonintractable headache, unspecified headache type     Assessment and Plan Assessment & Plan Nasal septal deviation with turbinate hypertrophy causing obstruction Nasal obstruction due to septal deviation and turbinate hypertrophy, particularly on the left. CT shows significant deviation and hypertrophy. Flonase  ineffective. Surgery considered to improve airflow and spray delivery. - Discussed surgical intervention to straighten septum and reduce turbinate size if CT shows no sinus disease. He is in agreement with this  - Add Flonase  to manage turbinate swelling.  Vasomotor rhinitis    Persistent rhinorrhea. Ipratropium provides partial relief with dryness.  - Continue ipratropium for drainage management. - Consider adding Flonase  to manage turbinate swelling.   Chronic daily headache Daily headaches for over a month. Last CT showed no sinus disease, suggesting non-sinus origin. Differential includes facial pain syndrome or migraine-like headaches versus new sinus disease that was not present prior. Managed with Tylenol  but with minimal relief. Pt wants to be sure there is no contribution from sinuses for his headaches.  - Ordered repeat CT scan to assess for new sinus disease or changes. - Consider referral to primary care for headache management if no sinus disease.   See below regarding exact medications prescribed this encounter including dosages and route: No orders of the defined types were placed  in this encounter.  I personally spent a total of 45 minutes in the care of the patient today including preparing to see the patient, getting/reviewing separately obtained history, and a long thorough discussion regarding all of his concerns as well as counseling and educating.    Thank you for allowing me the opportunity to care for your patient. Please do not hesitate to contact me should you have any other questions.  Sincerely, Hadassah Parody, MD Otolaryngologist (ENT), [facility] Phone: [phone] Fax: [phone]

## 2024-10-30 NOTE — Telephone Encounter (Signed)
 Updated auth:  Inspire NPSG MCD [facility]: [id number] (exp. 10/17/24 to 12/27/24)   Patient is scheduled at [facility] for 12/12/24 R 8 pm,  Mailed packet and sent mychart

## 2024-10-30 NOTE — Patient Instructions (Signed)
 I have ordered an imaging study for you to complete prior to your next visit. Please call [facility] Scheduling at [phone] to schedule your imaging if you have not received a call within 24 hours. If you are unable to complete your imaging study prior to your next scheduled visit please call our office to let us  know.

## 2024-10-30 NOTE — Progress Notes (Unsigned)
  Electrophysiology Office Note:   Date:  10/31/2024  ID:  Alm Ellen, DOB 03-12-1961, MRN [id number]  Primary Cardiologist: Ozell Fell, MD Electrophysiologist: Fonda Kitty, MD      History of Present Illness:   [name] [name] is a 63 y.o. male with h/o AF, HTN, CAD s/p STEMI with PCI, OSA on BiPAP, DM II, and anxiety / depression  seen today for routine electrophysiology follow-up s/p Watchman implantation.  Since last being seen in our clinic the patient reports doing OK. He has been having occasional palpitations about 1-2 times a week. Feels similar to his AF. Otherwise, he denies chest pain, dyspnea, PND, orthopnea, nausea, vomiting, dizziness, syncope, edema, weight gain, or early satiety.    Review of systems complete and found to be negative unless listed in HPI.   EP Information / Studies Reviewed:    EKG is not ordered today. EKG from 08/29/2024 reviewed which showed NSR at 79 bpm       Arrhythmia/Device History S/p PVI and posterior wall ablation 02/01/2024   Physical Exam:   VS:  BP 120/84 (BP Location: Left Arm, Patient Position: Sitting, Cuff Size: Normal)   Pulse 76   Ht 5' 10 (1.778 m)   Wt 187 lb (84.8 kg)   SpO2 96%   BMI 26.83 kg/m    Wt Readings from Last 3 Encounters:  10/31/24 187 lb (84.8 kg)  10/30/24 190 lb (86.2 kg)  10/11/24 195 lb 9.6 oz (88.7 kg)     GEN: No acute distress NECK: No JVD; No carotid bruits CARDIAC: Regular rate and rhythm, no murmurs, rubs, gallops RESPIRATORY:  Clear to auscultation without rales, wheezing or rhonchi  ABDOMEN: Soft, non-tender, non-distended EXTREMITIES:  No edema; No deformity   ASSESSMENT AND PLAN:    Atrial fibrillation Secondary hypercoagulable state S/p Watchman 09/14/2024 CT planned for 11/14/2024 >45 days out and OK to transition from Eliquis  to Plavix  75 mg daily and ASA 81 mg daily Prophylactic antibiotics for dental procedures  With palpitations, will have him wear Zio.  CAD No s/s of  ischemia.      HLD Continue statin  Overdue for gen cards follow up   Follow up with EP Team in March for 6 month post op follow up.   Signed, Ozell Prentice Passey, PA-C

## 2024-10-31 ENCOUNTER — Ambulatory Visit: Attending: Student

## 2024-10-31 ENCOUNTER — Encounter: Payer: Self-pay | Admitting: Student

## 2024-10-31 ENCOUNTER — Ambulatory Visit: Attending: Student | Admitting: Student

## 2024-10-31 ENCOUNTER — Other Ambulatory Visit (HOSPITAL_COMMUNITY): Payer: Self-pay

## 2024-10-31 VITALS — BP 120/84 | HR 76 | Ht 70.0 in | Wt 187.0 lb

## 2024-10-31 DIAGNOSIS — D6869 Other thrombophilia: Secondary | ICD-10-CM | POA: Insufficient documentation

## 2024-10-31 DIAGNOSIS — I480 Paroxysmal atrial fibrillation: Secondary | ICD-10-CM

## 2024-10-31 DIAGNOSIS — Z95818 Presence of other cardiac implants and grafts: Secondary | ICD-10-CM | POA: Diagnosis not present

## 2024-10-31 LAB — CBC

## 2024-10-31 MED ORDER — CLOPIDOGREL BISULFATE 75 MG PO TABS: 75.0000 mg | ORAL_TABLET | Freq: Every day | ORAL | 1 refills | Status: AC

## 2024-10-31 NOTE — Progress Notes (Unsigned)
 Enrolled patient for a 14 day Zio XT monitor to be mailed to patients home  Kennyth to read

## 2024-10-31 NOTE — Patient Instructions (Addendum)
 Medication Instructions:  STOP Eliquis  with the evening dose tonight Tomorrow, 11/5 start plavix  75 mg daily and ASA 81 mg daily.  *If you need a refill on your cardiac medications before your next appointment, please call your pharmacy*  Lab Work: BMET and CBC today If you have labs (blood work) drawn today and your tests are completely normal, you will receive your results only by: MyChart Message (if you have MyChart) OR A paper copy in the mail If you have any lab test that is abnormal or we need to change your treatment, we will call you to review the results.  Testing/Procedures: CT has previously been ordered. See attached.   ZIO XT- Long Term Monitor Instructions  Your physician has requested you wear a ZIO patch monitor for 14 days.  This is a single patch monitor. [employer] supplies one patch monitor per enrollment. Additional stickers are not available. Please do not apply patch if you will be having a Nuclear Stress Test,  Echocardiogram, Cardiac CT, MRI, or Chest Xray during the period you would be wearing the  monitor. The patch cannot be worn during these tests. You cannot remove and re-apply the  ZIO XT patch monitor.  Your ZIO patch monitor will be mailed 3 day USPS to your address on file. It may take 3-5 days  to receive your monitor after you have been enrolled.  Once you have received your monitor, please review the enclosed instructions. Your monitor  has already been registered assigning a specific monitor serial # to you.  Billing and Patient Assistance Program Information  We have supplied [employer] with any of your insurance information on file for billing purposes. [employer] offers a sliding scale Patient Assistance Program for patients that do not have  insurance, or whose insurance does not completely cover the cost of the ZIO monitor.  You must apply for the Patient Assistance Program to qualify for this discounted rate.  To apply, please call [employer] at  [phone], select option 4, select option 2, ask to apply for  Patient Assistance Program. Meredeth will ask your household income, and how many people  are in your household. They will quote your out-of-pocket cost based on that information.  [employer] will also be able to set up a 34-month, interest-free payment plan if needed.  Applying the monitor   Shave hair from upper left chest.  Hold abrader disc by orange tab. Rub abrader in 40 strokes over the upper left chest as  indicated in your monitor instructions.  Clean area with 4 enclosed alcohol pads. Let dry.  Apply patch as indicated in monitor instructions. Patch will be placed under collarbone on left  side of chest with arrow pointing upward.  Rub patch adhesive wings for 2 minutes. Remove white label marked 1. Remove the white  label marked 2. Rub patch adhesive wings for 2 additional minutes.  While looking in a mirror, press and release button in center of patch. A small green light will  flash 3-4 times. This will be your only indicator that the monitor has been turned on.  Do not shower for the first 24 hours. You may shower after the first 24 hours.  Press the button if you feel a symptom. You will hear a small click. Record Date, Time and  Symptom in the Patient Logbook.  When you are ready to remove the patch, follow instructions on the last 2 pages of Patient  Logbook. Stick patch monitor onto the last page of Patient  Logbook.  Place Patient Logbook in the blue and white box. Use locking tab on box and tape box closed  securely. The blue and white box has prepaid postage on it. Please place it in the mailbox as  soon as possible. Your physician should have your test results approximately 7 days after the  monitor has been mailed back to [facility].  Call [facility] Customer Care at [phone] if you have questions regarding  your ZIO XT patch monitor. Call them immediately if you see an orange light  blinking on your  monitor.  If your monitor falls off in less than 4 days, contact our Monitor department at [phone].  If your monitor becomes loose or falls off after 4 days call [employer] at [phone] for  suggestions on securing your monitor   Follow-Up: At [facility], you and your health needs are our priority.  As part of our continuing mission to provide you with exceptional heart care, our providers are all part of one team.  This team includes your primary Cardiologist (physician) and Advanced Practice Providers or APPs (Physician Assistants and Nurse Practitioners) who all work together to provide you with the care you need, when you need it.  Your next appointment:   5 month(s) for 6 month post op appointment.   Provider:   You may see Fonda Kitty, MD or one of the following Advanced Practice Providers on your designated Care Team:   Charlies Arthur, PA-C Michael Andy Tillery, PA-C Suzann Riddle, NP Daphne Barrack, NP    Schedule general cardiology follow up.  We recommend signing up for the patient portal called MyChart.  Sign up information is provided on this After Visit Summary.  MyChart is used to connect with patients for Virtual Visits (Telemedicine).  Patients are able to view lab/test results, encounter notes, upcoming appointments, etc.  Non-urgent messages can be sent to your provider as well.   To learn more about what you can do with MyChart, go to [email or web address].

## 2024-11-01 ENCOUNTER — Ambulatory Visit: Payer: Self-pay | Admitting: Student

## 2024-11-01 ENCOUNTER — Other Ambulatory Visit (HOSPITAL_COMMUNITY): Payer: Self-pay

## 2024-11-01 LAB — CBC
Hematocrit: 48.8 % (ref 37.5–51.0)
Hemoglobin: 16.5 g/dL (ref 13.0–17.7)
MCH: 32 pg (ref 26.6–33.0)
MCHC: 33.8 g/dL (ref 31.5–35.7)
MCV: 95 fL (ref 79–97)
Platelets: 277 x10E3/uL (ref 150–450)
RBC: 5.16 x10E6/uL (ref 4.14–5.80)
RDW: 13 % (ref 11.6–15.4)
WBC: 9 x10E3/uL (ref 3.4–10.8)

## 2024-11-01 LAB — BASIC METABOLIC PANEL WITH GFR
BUN/Creatinine Ratio: 20 (ref 10–24)
BUN: 24 mg/dL (ref 8–27)
CO2: 24 mmol/L (ref 20–29)
Calcium: 10.2 mg/dL (ref 8.6–10.2)
Chloride: 105 mmol/L (ref 96–106)
Creatinine, Ser: 1.2 mg/dL (ref 0.76–1.27)
Glucose: 106 mg/dL — ABNORMAL HIGH (ref 70–99)
Potassium: 4.4 mmol/L (ref 3.5–5.2)
Sodium: 145 mmol/L — ABNORMAL HIGH (ref 134–144)
eGFR: 69 mL/min/1.73 (ref >59)

## 2024-11-02 ENCOUNTER — Other Ambulatory Visit: Payer: Self-pay | Admitting: Emergency Medicine

## 2024-11-02 ENCOUNTER — Telehealth: Payer: Self-pay | Admitting: Cardiovascular Disease

## 2024-11-02 MED ORDER — NITROGLYCERIN 0.4 MG SL SUBL: 0.4000 mg | SUBLINGUAL_TABLET | SUBLINGUAL | 0 refills | Status: DC | PRN

## 2024-11-02 NOTE — Telephone Encounter (Signed)
#  15 Nitroglycerin  has been sent to [facility].

## 2024-11-02 NOTE — Telephone Encounter (Signed)
 Copied from CRM #[id number]. Topic: Clinical - Medication Refill >> Nov 02, 2024  2:22 PM Corean R wrote: Medication: pantoprazole  (PROTONIX ) 40 MG tablet [[id number]]  Has the patient contacted their pharmacy? Yes, needs refills.  (Agent: If no, request that the patient contact the pharmacy for the refill. If patient does not wish to contact the pharmacy document the reason why and proceed with request.) (Agent: If yes, when and what did the pharmacy advise?)  This is the patient's preferred pharmacy:  [id number] [place], [place] - [address] AT [facility] [address] [facility] [address] [place] [address] Phone: [phone] Fax: [phone]        Is this the correct pharmacy for this prescription? Yes If no, delete pharmacy and type the correct one.   Has the prescription been filled recently? Yes  Is the patient out of the medication? Almost  Has the patient been seen for an appointment in the last year OR does the patient have an upcoming appointment? Yes  Can we respond through MyChart? No  Agent: Please be advised that Rx refills may take up to 3 business days. We ask that you follow-up with your pharmacy.

## 2024-11-02 NOTE — Telephone Encounter (Signed)
 *  STAT* If patient is at the pharmacy, call can be transferred to refill team.   1. Which medications need to be refilled? (please list name of each medication and dose if known)   nitroGLYCERIN  (NITROSTAT ) 0.4 MG SL tablet     2. Would you like to learn more about the convenience, safety, & potential cost savings by using the [facility]? No      3. Are you open to using the [facility] (Type [facility]. ). No    4. Which pharmacy/location (including street and city if local pharmacy) is medication to be sent to?  [id number] - [place], [place] - [address] AT [address]     5. Do they need a 30 day or 90 day supply? 1 bottle

## 2024-11-03 ENCOUNTER — Telehealth: Payer: Self-pay | Admitting: Cardiology

## 2024-11-03 ENCOUNTER — Other Ambulatory Visit: Payer: Self-pay

## 2024-11-03 MED ORDER — PANTOPRAZOLE SODIUM 40 MG PO TBEC: 40.0000 mg | DELAYED_RELEASE_TABLET | Freq: Every day | ORAL | 1 refills | Status: AC

## 2024-11-03 NOTE — Telephone Encounter (Signed)
 Pt received ZIO monitor and would like assistance with applying the monitor. Please advise

## 2024-11-03 NOTE — Telephone Encounter (Signed)
 Spoke to patient he stated he would like to have monitor put on in office.Spoke to monitor tech appointment scheduled 11/10 at 10:00 am.

## 2024-11-06 ENCOUNTER — Ambulatory Visit

## 2024-11-07 ENCOUNTER — Ambulatory Visit: Attending: Cardiology

## 2024-11-09 ENCOUNTER — Other Ambulatory Visit (INDEPENDENT_AMBULATORY_CARE_PROVIDER_SITE_OTHER): Payer: Self-pay

## 2024-11-09 DIAGNOSIS — J321 Chronic frontal sinusitis: Secondary | ICD-10-CM

## 2024-11-13 ENCOUNTER — Telehealth: Payer: Self-pay | Admitting: Student

## 2024-11-13 ENCOUNTER — Ambulatory Visit (HOSPITAL_COMMUNITY)

## 2024-11-13 NOTE — Telephone Encounter (Signed)
 Patient would like to know if he needs to take off his heart monitor and his Dexcom prior to his CT scan tomorrow. Patient also has an inspire device and would like to know if he needs to bring his remote. Advised patient to leave everything on as it is and bring remote. Also provided number for CT navigators for further clarification.

## 2024-11-13 NOTE — Telephone Encounter (Signed)
 Patient is currently wearing a monitor, that he just put on last week (Tuesday).  He has a CT Cardiac Morph scan scheduled for tomorrow, 11/18.  He does not want to reschedule it.  Please advise.

## 2024-11-14 ENCOUNTER — Telehealth (HOSPITAL_COMMUNITY): Payer: Self-pay | Admitting: *Deleted

## 2024-11-14 ENCOUNTER — Ambulatory Visit: Payer: Self-pay

## 2024-11-14 ENCOUNTER — Ambulatory Visit (HOSPITAL_COMMUNITY)
Admission: RE | Admit: 2024-11-14 | Discharge: 2024-11-14 | Disposition: A | Discharge status: Home / Self Care | Source: Ambulatory Visit | Attending: Cardiology | Admitting: Cardiology

## 2024-11-14 DIAGNOSIS — Z95818 Presence of other cardiac implants and grafts: Secondary | ICD-10-CM | POA: Diagnosis not present

## 2024-11-14 DIAGNOSIS — I480 Paroxysmal atrial fibrillation: Secondary | ICD-10-CM | POA: Insufficient documentation

## 2024-11-14 MED ORDER — IOHEXOL 350 MG/ML SOLN
80.0000 mL | Freq: Once | INTRAVENOUS | Status: AC | PRN
  Administered 2024-11-14: 80 mL via INTRAVENOUS

## 2024-11-14 NOTE — Telephone Encounter (Signed)
 Returning call to patient regarding his questions about his inspire device, dexcom, and heart monitor in regards to his upcoming cardiac CT appt. All questions answered and patient grateful for the call.  Chantal Requena RN Navigator Cardiac Imaging [facility] [phone] Office [phone] Cell

## 2024-11-20 ENCOUNTER — Other Ambulatory Visit

## 2024-11-27 ENCOUNTER — Other Ambulatory Visit: Payer: Self-pay | Admitting: Cardiovascular Disease

## 2024-11-27 DIAGNOSIS — I480 Paroxysmal atrial fibrillation: Secondary | ICD-10-CM

## 2024-11-30 ENCOUNTER — Inpatient Hospital Stay: Admission: RE | Admit: 2024-11-30 | Discharge: 2024-11-30

## 2024-11-30 DIAGNOSIS — J321 Chronic frontal sinusitis: Secondary | ICD-10-CM

## 2024-12-06 ENCOUNTER — Encounter: Payer: Self-pay | Admitting: Neurology

## 2024-12-06 ENCOUNTER — Telehealth: Payer: Self-pay | Admitting: Neurology

## 2024-12-06 ENCOUNTER — Ambulatory Visit: Admitting: Neurology

## 2024-12-06 VITALS — BP 108/68 | HR 86 | Ht 70.0 in | Wt 196.0 lb

## 2024-12-06 DIAGNOSIS — Z789 Other specified health status: Secondary | ICD-10-CM

## 2024-12-06 DIAGNOSIS — Z95820 Peripheral vascular angioplasty status with implants and grafts: Secondary | ICD-10-CM

## 2024-12-06 DIAGNOSIS — Z9682 Presence of neurostimulator: Secondary | ICD-10-CM | POA: Diagnosis not present

## 2024-12-06 DIAGNOSIS — G4719 Other hypersomnia: Secondary | ICD-10-CM | POA: Diagnosis not present

## 2024-12-06 NOTE — Telephone Encounter (Signed)
 Patient was seen today for his inspire follow up before his Sleep study that is scheduled on 12/12/2024 at 8 pm. Damien the [place] rep was present for the office visit.

## 2024-12-06 NOTE — Progress Notes (Addendum)
 Provider:  Dedra Gores, MD  Primary Care Physician:  Kip Righter, MD [address] [place] [address]     Referring Provider: Kip Righter, Md [address] [place],  [place] [address]          Chief Complaint according to patient   Patient presents with:          Patient is here alone for inspire appointment and brought his remote ESS - 11 FSS- 49       HISTORY OF PRESENT ILLNESS:  [name] [name] is a 63 y.o. male patient who is here for revisit 12/06/2024 for  inspire;  Patient is here alone for inspire appointment and brought his remote ESS - 11 FSS- 49     No significant improvement in sleep or sleepiness yet, home use at  1.1 V . Depression is severe.     Review of Systems: Out of a complete 14 system review, the patient complains of only the following symptoms, and all other reviewed systems are negative.:   SLEEPINESS ?  How likely are you to doze in the following situations: 0 = not likely, 1 = slight chance, 2 = moderate chance, 3 = high chance  Sitting and Reading? Watching Television? Sitting inactive in a public place (theater or meeting)? Lying down in the afternoon when circumstances permit? Sitting and talking to someone? Sitting quietly after lunch without alcohol? In a car, while stopped for a few minutes in traffic? As a passenger in a car for an hour without a break?  Total = 12 /24  FSS at 59/63        Social History   Socioeconomic History   Marital status: Single    Spouse name: Not on file   Number of children: Not on file   Years of education: Not on file   Highest education level: Not on file  Occupational History   Not on file  Tobacco Use   Smoking status: Former    Current packs/day: 0.00    Average packs/day: 1 pack/day for 10.0 years (10.0 ttl pk-yrs)    Types: Cigarettes    Start date: 04/20/1982    Quit date: 04/20/1992    Years since quitting: 32.6    Smokeless tobacco: Never  Vaping Use   Vaping status: Never Used  Substance and Sexual Activity   Alcohol use: No   Drug use: No   Sexual activity: Yes  Other Topics Concern   Not on file  Social History Narrative   Not on file   Social Drivers of Health   Financial Resource Strain: Not on file  Food Insecurity: No Food Insecurity (09/14/2024)   Hunger Vital Sign    Worried About Running Out of Food in the Last Year: Never true    Ran Out of Food in the Last Year: Never true  Transportation Needs: Unmet Transportation Needs (09/14/2024)   PRAPARE - Administrator, Civil Service (Medical): Yes    Lack of Transportation (Non-Medical): No  Physical Activity: Unknown (03/20/2019)   Exercise Vital Sign    Days of Exercise per Week: 0 days    Minutes of Exercise per Session: Not on file  Stress: Not on file  Social Connections: Unknown (05/11/2022)   [facility]   Social Network    Social Network: Not on file    Family History  Problem Relation Age of Onset  Cancer Mother        lung   Cancer Father        prostate, mets to bone, lung   Cancer Brother        melanoma   Heart disease Maternal Grandmother        MI   Lung disease Maternal Grandfather        black lung   Heart disease Paternal Grandmother    Stroke Paternal Grandmother    Heart disease Paternal Grandfather    Heart disease Brother 83       MI   Diabetes Brother     Past Medical History:  Diagnosis Date   ADHD (attention deficit hyperactivity disorder)    Anxiety    Coronary artery disease    06/2023 - PCI to circumflex and pLAD PCI 11/10/23   Depression    Diabetes mellitus    Type 2   Dyspnea    Dysrhythmia    A. 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  [place], [place]   CORONARY STENT INTERVENTION N/A 11/10/2023   Procedure: CORONARY STENT INTERVENTION;  Surgeon: Wonda Sharper, MD;  Location: [facility] INVASIVE CV LAB;  Service: Cardiovascular;  Laterality: N/A;   CORONARY ULTRASOUND/IVUS N/A 11/10/2023   Procedure: Coronary Ultrasound/IVUS;  Surgeon: Wonda Sharper, MD;  Location: [facility] INVASIVE CV LAB;  Service: Cardiovascular;  Laterality: N/A;   CORONARY/GRAFT ACUTE MI REVASCULARIZATION N/A 07/25/2023   Procedure: Coronary/Graft Acute MI Revascularization;  Surgeon: Wonda Sharper, MD;  Location: [facility] INVAS[facility] CV LAB;  Service: Cardiovascular;  Laterality: N/A;   DRUG INDUCED ENDOSCOPY N/A 05/10/2024   Procedure: DRUG INDUCED SLEEP ENDOSCOPY;  Surgeon: Okey Burns, MD;  Location: [facility] OR;  Service: ENT;  Laterality: N/A;   IMPLANTATION OF HYPOGLOSSAL NERVE STIMULATOR N/A 08/09/2024   Procedure: INSERTION, HYPOGLOSSAL NERVE STIMULATOR;  Surgeon: Okey Burns, MD;  Location: [facility] OR;  Service: ENT;  Laterality: N/A;   KNEE ARTHROSCOPY Left    LEFT ATRIAL APPENDAGE OCCLUSION N/A 09/14/2024   Procedure: LEFT ATRIAL APPENDAGE OCCLUSION;  Surgeon: Kennyth Chew, MD;  Location: [facility] INVASIVE CV LAB;  Service: Cardiovascular;  Laterality: N/A;   LEFT HEART CATH AND CORONARY ANGIOGRAPHY N/A 07/25/2023   Procedure: LEFT HEART CATH  AND CORONARY ANGIOGRAPHY;  Surgeon: Wonda Sharper, MD;  Location: [facility] INVASIVE CV LAB;  Service: Cardiovascular;  Laterality: N/A;   RENAL ANGIOGRAPHY N/A 07/16/2020   Procedure: RENAL ANGIOGRAPHY;  Surgeon: Ladona Heinz, MD;  Location: [facility] INVASIVE CV LAB;  Service: Cardiovascular;  Laterality: N/A;   SACROILIAC JOINT FUSION Right 05/02/2014  Procedure: SACROILIAC JOINT FUSION;  Surgeon: Oneil Rodgers Priestly, MD;  Location: [facility] OR;  Service: Orthopedics;  Laterality: Right;  Right sided sacroiliac joint fusion   TONSILLECTOMY  2013    uvula tacked up per pt   TRANSESOPHAGEAL ECHOCARDIOGRAM (CATH LAB) N/A 09/14/2024   Procedure: TRANSESOPHAGEAL ECHOCARDIOGRAM;  Surgeon: Kennyth Chew, MD;  Location: [facility] INVASIVE CV LAB;  Service: Cardiovascular;  Laterality: N/A;     Current Outpatient Medications on File Prior to Visit  Medication Sig Dispense Refill   acetaminophen  (TYLENOL ) 500 MG tablet Take 1 tablet (500 mg total) by mouth every 6 (six) hours. Take every 6 hrs x 2-3 days then take every 6 hrs as needed 30 tablet 0   amLODipine  (NORVASC ) 5 MG tablet Take 1 tablet (5 mg total) by mouth daily. 90 tablet 3   aspirin  EC 81 MG tablet Take 1 tablet (81 mg total) by mouth daily. Swallow whole. 90 tablet 2   atorvastatin  (LIPITOR) 40 MG tablet Take 40 mg by mouth daily.     carvedilol  (COREG ) 25 MG tablet TAKE 1 TABLET(25 MG) BY MOUTH TWICE DAILY 180 tablet 3   cetirizine  (ZYRTEC ) 10 MG tablet Take 10 mg by mouth daily.     clopidogrel  (PLAVIX ) 75 MG tablet Take 1 tablet (75 mg total) by mouth daily. 90 tablet 1   Continuous Glucose Sensor (DEXCOM G7 SENSOR) MISC Change sensor every 10 days 9 each 3   Continuous Glucose Sensor (DEXCOM G7 SENSOR) MISC Change every 10 days     empagliflozin  (JARDIANCE ) 25 MG TABS tablet Take 1 tablet (25 mg total) by mouth daily before breakfast. 90 tablet 3   insulin  glargine (LANTUS  SOLOSTAR) 100 UNIT/ML Solostar Pen Inject 50 Units into the skin daily. (Patient  taking differently: Inject 40 Units into the skin at bedtime.) 60 mL 3   insulin  lispro (HUMALOG  KWIKPEN) 200 UNIT/ML KwikPen Max daily 120 units (Patient taking differently: Inject 36 Units into the skin 3 (three) times daily before meals.) 60 mL 3   Insulin  Pen Needle (UNIFINE PENTIPS) 32G X 4 MM MISC 1 Device by Other route in the morning, at noon, in the evening, and at bedtime. 400 each 3   Insulin  Pen Needle 32G X 4 MM MISC 1 Device by Does not apply route in the morning, at noon, in the evening, and at bedtime. 400 each 3   ipratropium (ATROVENT ) 0.03 % nasal spray Place 2 sprays into both nostrils 3 (three) times daily as needed for rhinitis. 90 mL 3   losartan  (COZAAR ) 100 MG tablet Take 1 tablet (100 mg total) by mouth daily. 90 tablet 3   nitroGLYCERIN  (NITROSTAT ) 0.4 MG SL tablet DISSOLVE 1 TABLET UNDER THE TONGUE EVERY 5 MINUTES AS NEEDED FOR CHEST PAIN 25 tablet 3   pantoprazole  (PROTONIX ) 40 MG tablet Take 1 tablet (40 mg total) by mouth daily. 90 tablet 1   REPATHA  SURECLICK 140 MG/ML SOAJ ADMINISTER 1 ML UNDER THE SKIN EVERY 14 DAYS. 6 mL 3   rOPINIRole  (REQUIP ) 2 MG tablet Take 2 mg by mouth at bedtime.     Semaglutide , 2 MG/DOSE, 8 MG/3ML SOPN Inject 2 mg as directed once a week. (Patient taking differently: Inject 2 mg as directed every Saturday.) 9 mL 3   tamsulosin  (FLOMAX ) 0.4 MG CAPS capsule Take 0.4 mg by mouth at bedtime.     triamcinolone  cream (KENALOG ) 0.1 % Apply 1 Application topically 2 (two) times daily. (Patient taking differently: Apply 1 Application topically 2 (  two) times daily as needed (rash).) 30 g 0   vortioxetine  HBr (TRINTELLIX ) 20 MG TABS tablet Take 1 tablet (20 mg total) by mouth daily. 30 tablet 0   levocetirizine (XYZAL  ALLERGY  24HR) 5 MG tablet Take 1 tablet (5 mg total) by mouth every evening. (Patient not taking: Reported on 12/06/2024) 90 tablet 3   No current facility-administered medications on file prior to visit.    Allergies  Allergen  Reactions   Pravastatin Other (See Comments)    Joint myalgias   Trazodone And Nefazodone     Had restless legs   Hydrochlorothiazide  Other (See Comments)    lightheadedness   Latex Rash   Metformin  And Related Diarrhea   Zetia  [Ezetimibe ] Other (See Comments)    Myalgias, joint pain     DIAGNOSTIC DATA (LABS, IMAGING, TESTING) - I reviewed patient records, labs, notes, testing and imaging myself where available.  Lab Results  Component Value Date   WBC 9.0 10/31/2024   HGB 16.5 10/31/2024   HCT 48.8 10/31/2024   MCV 95 10/31/2024   PLT 277 10/31/2024      Component Value Date/Time   NA 145 (H) 10/31/2024 1034   K 4.4 10/31/2024 1034   CL 105 10/31/2024 1034   CO2 24 10/31/2024 1034   GLUCOSE 106 (H) 10/31/2024 1034   GLUCOSE 134 (H) 08/03/2024 1026   BUN 24 10/31/2024 1034   CREATININE 1.20 10/31/2024 1034   CALCIUM  10.2 10/31/2024 1034   PROT 7.4 04/11/2024 1048   PROT 7.1 09/21/2023 0936   ALBUMIN 3.5 04/11/2024 1048   ALBUMIN 4.5 09/21/2023 0936   AST 25 04/11/2024 1048   ALT 27 04/11/2024 1048   ALKPHOS 62 04/11/2024 1048   BILITOT 0.5 04/11/2024 1048   BILITOT 0.4 09/21/2023 0936   GFRNONAA >60 08/03/2024 1026   GFRAA 80 10/29/2020 1333   Lab Results  Component Value Date   CHOL 53 (L) 03/13/2024   HDL 25 (L) 03/13/2024   LDLCALC 10 03/13/2024   LDLDIRECT 93 08/20/2009   TRIG 86 03/13/2024   CHOLHDL 2.1 03/13/2024   Lab Results  Component Value Date   HGBA1C 5.3 08/03/2024   Lab Results  Component Value Date   VITAMINB12 363 01/20/2011   Lab Results  Component Value Date   TSH 1.60 01/26/2024    PHYSICAL EXAM:  Vitals:   12/06/24 1041  BP: 108/68  Pulse: 86  SpO2: 94%   No data found. Body mass index is 28.12 kg/m.   Wt Readings from Last 3 Encounters:  12/06/24 196 lb (88.9 kg)  10/31/24 187 lb (84.8 kg)  10/30/24 190 lb (86.2 kg)     Ht Readings from Last 3 Encounters:  12/06/24 5' 10 (1.778 m)  10/31/24 5' 10 (1.778  m)  10/30/24 5' 10 (1.778 m)      General: The patient is awake, alert and appears not in acute distress and groomed. Head: Normocephalic, atraumatic.  Neck is supple.   NEUROLOGIC EXAM: The patient is awake and alert, oriented to place and time.   Memory subjective described as intact.  Attention span & concentration ability appears normal.   Speech is fluent,  without  dysarthria, dysphonia or aphasia.  Mood and affect are appropriate.   Neurological Examination: Mental Status: Intact. Language and speech are normal. No cognitive deficits. Cranial Nerves II-XII: Intact. PERL. EOMI. VFF. No nystagmus.  No facial droop.  No ptosis.   ASSESSMENT AND PLAN :   63 y.o. year old  male  here with:    INSPIRE IMPLANT: has titration sleep study on 12-12-2024 . RV after that study is read.    I would like to thank Kip Righter, Md [address] [place],  [place] [address] for allowing me to meet with this pleasant patient.   Sleep Clinic Patients are generally offered input on sleep hygiene, life style changes and how to improve compliance with medical treatment where applicable. Review and reiteration of good sleep hygiene measures is offered to any sleep clinic patient, be it in the first consultation or with any follow up visits.    Any patient with sleepiness should be cautioned not to drive, work at heights, or operate dangerous or heavy equipment when feeling tired or sleepy.      The patient will be seen in follow-up in the sleep clinic at [facility] for discussion of test results, sleep related symptoms and treatment compliance review, further management strategies, etc.   The referring provider will be notified of the test results.   The patient's condition requires frequent monitoring and adjustments in the treatment plan, reflecting the ongoing complexity of care.  This provider is the continuing focal point for all needed services for this  condition.  After spending a total time of  30  minutes face to face and time for  history taking, physical and neurologic examination, review of laboratory studies,  personal review of imaging studies, reports and results of other testing and review of referral information / records as far as provided in visit,   Electronically signed by: Dedra Gores, MD 12/06/2024 11:43 AM  [facility] Board certified by The [employer] of Sleep Medicine and Diplomate of the [employer] of [employer]. Board certified In Neurology through the ABPN, Fellow of the [employer] of [employer].

## 2024-12-07 ENCOUNTER — Other Ambulatory Visit: Payer: Self-pay | Admitting: Cardiovascular Disease

## 2024-12-08 ENCOUNTER — Encounter: Payer: Self-pay | Admitting: Cardiovascular Disease

## 2024-12-08 ENCOUNTER — Ambulatory Visit: Attending: Cardiovascular Disease | Admitting: Cardiovascular Disease

## 2024-12-08 VITALS — BP 130/90 | HR 74 | Ht 70.0 in | Wt 200.0 lb

## 2024-12-08 DIAGNOSIS — E7800 Pure hypercholesterolemia, unspecified: Secondary | ICD-10-CM

## 2024-12-08 DIAGNOSIS — I2510 Atherosclerotic heart disease of native coronary artery without angina pectoris: Secondary | ICD-10-CM

## 2024-12-08 DIAGNOSIS — I480 Paroxysmal atrial fibrillation: Secondary | ICD-10-CM | POA: Diagnosis not present

## 2024-12-08 DIAGNOSIS — I10 Essential (primary) hypertension: Secondary | ICD-10-CM | POA: Diagnosis not present

## 2024-12-08 MED ORDER — AMLODIPINE BESYLATE 10 MG PO TABS: 10.0000 mg | ORAL_TABLET | Freq: Every day | ORAL | 3 refills | Status: AC

## 2024-12-08 NOTE — Assessment & Plan Note (Addendum)
 Patient followed by EP.  He has undergone watchman implantation.  Follow-up EP per protocol.  Off of oral anticoagulation.

## 2024-12-08 NOTE — Assessment & Plan Note (Addendum)
 Stable without angina.  Treated with aspirin , atorvastatin , and Repatha .  Patient most recently underwent PCI of the LAD in November 2024.  Once he is done with the 78-month post-watchman regimen, he could go on clopidogrel  monotherapy.

## 2024-12-08 NOTE — Progress Notes (Signed)
 Cardiology Office Note:    Date:  12/08/2024   ID:  Alm Ellen, DOB April 04, 1961, MRN [id number]  PCP:  Kip Righter, MD   [facility] Providers Cardiologist:  Ozell Fell, MD Electrophysiologist:  Fonda Kitty, MD     Referring MD: Kip Righter, MD   Chief Complaint  Patient presents with   Coronary Artery Disease    History of Present Illness:    Moss Berry is a 63 y.o. male with a hx of:  Coronary artery disease  Inf-Lat STEMI 06/2023 s/p 2.5 x 24 mm DES to dLCx, 3.5 x 16 mm DES to [facility] 07/25/23: pLCx 80, mLCx 100, pLAD 70 (ulcerative plaque), RCA mild diffuse disease, EF 50-55 TTE 07/26/23: EF 50-55, inferolateral and anterolateral AK, mild LVH, GR 1 DD, normal RVSF, trivial MR, mild AV calcification, RAP 8 PCI of the proximal LAD 11/10/2023 with a 4.0 x 16 mm Synergy DES Paroxysmal atrial fibrillation  Monitor 09/28/2023: NSR, no bradycardic events or high-grade heart block; A-fib with RVR <1% burden-longest episode 20 minutes Watchman implant 09/14/2024 (24 mm watchman flex) Hypertension  US  05/23/20: > 60% L RA stenosis  Renal angio 07/16/20: no RAS bilaterally Hyperlipidemia   Diabetes mellitus  OSA  The patient is here alone today.  He has been doing pretty well from a cardiac perspective.  He has undergone implantation of an inspire device for sleep apnea.  He has had no recent chest pain, chest pressure, or shortness of breath.  He has not been physically active with any regular exercise.  He is compliant with his medications.  He brings in home blood pressure recordings which demonstrate his blood pressure is above goal and ranging from 130-1 150s over 70s to 90s.  Current Medications: Active Medications[1]   Allergies:   Pravastatin, Trazodone and nefazodone, Hydrochlorothiazide , Latex, Metformin  and related, and Zetia  [ezetimibe ]   ROS:   Please see the history of present illness.    All other systems reviewed and are  negative.  EKGs/Labs/Other Studies Reviewed:    The following studies were reviewed today: Cardiac Studies & Procedures   ______________________________________________________________________________________________ CARDIAC CATHETERIZATION  CARDIAC CATHETERIZATION 11/10/2023  Conclusion Successful IVUS guided PCI of severe ulcerated stenosis in the proximal LAD, reducing 70% stenosis to 0% with a 4.0 x 16 mm Synergy DES  Recommendations: Continue clopidogrel  without interruption for at least another 6 months.  Resume apixaban  tomorrow.  No concomitant aspirin  until clopidogrel  discontinued.  Anticipate hospital discharge tomorrow morning.  IVUS findings: Reference vessel diameter 4.5 mm maximum.  Lesion is calcified and ulcerated also with concentric fibrous plaquing noted.  Lesion extends back to the ostium of the LAD.  Post PCI IVUS findings: Stent well-expanded, well apposed throughout with good proximal and distal transitions.  Findings Coronary Findings Diagnostic  Dominance: Right  Left Anterior Descending Prox LAD lesion is 70% stenosed. The lesion is ulcerative.  Left Circumflex Non-stenotic Prox Cx to Mid Cx lesion was previously treated. Non-stenotic Mid Cx to Dist Cx lesion was previously treated. The lesion is thrombotic.  Right Coronary Artery There is mild diffuse disease throughout the vessel.  Intervention  Prox LAD lesion Stent CATH VISTA GUIDE 6FR XBLAD3.5 guide catheter was inserted. Pre-stent angioplasty was not performed. A drug-eluting stent was successfully placed using a SYNERGY XD 4.0X20. Post-stent angioplasty was performed using a BALL SAPPHIRE NC24 4.5X12. Maximum pressure:  16 atm. Post-Intervention Lesion Assessment The intervention was successful. Pre-interventional TIMI flow is 3. Post-intervention TIMI flow is 3. No complications occurred  at this lesion. Ultrasound (IVUS) was performed on the lesion post PCI using a CATH OPTICROSS HD. Stent  well apposed. Lesion characteristics:  calcified, eccentric and ulcerative. There is a 0% residual stenosis post intervention.   CARDIAC CATHETERIZATION  CARDIAC CATHETERIZATION 07/25/2023  Conclusion   Mid Cx to Dist Cx lesion is 100% stenosed.   Prox Cx to Mid Cx lesion is 80% stenosed.   Prox LAD lesion is 70% stenosed.   A drug-eluting stent was successfully placed using a SYNERGY XD 2.50X24.   A drug-eluting stent was successfully placed using a STENT MEGATRON 3.5X16.   Post intervention, there is a 0% residual stenosis.   Post intervention, there is a 0% residual stenosis.   LV end diastolic pressure is low.   The left ventricular ejection fraction is 50-55% by visual estimate.   Recommend uninterrupted dual antiplatelet therapy with Aspirin  81mg  daily and Ticagrelor  90mg  twice daily for a minimum of 12 months (ACS-Class I recommendation).  Acute inferolateral MI secondary to occlusion of the distal circumflex, treated successful with primary PCI using a 2.5x24 mm Synergy DES Severe mid-circumflex stenosis, treated with a 3.5x16 mm Megatron DES (80%-->0) Moderately severe ulcerative plaque in the proximal LAD of 70% Nonobstructive RCA stenosis Mild segmental LV dysfunction with LVEF estimated at 50-55%  Recommend: post-MI medical therapy. Consider staged IVUS +/- PCI of the proximal LAD  Findings Coronary Findings Diagnostic  Dominance: Right  Left Anterior Descending Prox LAD lesion is 70% stenosed. The lesion is ulcerative.  Left Circumflex Prox Cx to Mid Cx lesion is 80% stenosed. Mid Cx to Dist Cx lesion is 100% stenosed. The lesion is thrombotic.  Right Coronary Artery There is mild diffuse disease throughout the vessel.  Intervention  Prox Cx to Mid Cx lesion Stent A drug-eluting stent was successfully placed using a STENT MEGATRON 3.5X16. Maximum pressure: 14 atm. Post-stent angioplasty was not performed. Post-Intervention Lesion Assessment The intervention  was successful. Pre-interventional TIMI flow is 3. Post-intervention TIMI flow is 3. No complications occurred at this lesion. There is a 0% residual stenosis post intervention.  Mid Cx to Dist Cx lesion Stent CATH LAUNCHER 6FR EBU3.5 guide catheter was inserted. Lesion crossed with guidewire using a WIRE COUGAR XT STRL 190CM. Pre-stent angioplasty was performed using a BALLN EMERGE MR 2.5X12. A drug-eluting stent was successfully placed using a SYNERGY XD 2.50X24. Post-stent angioplasty was performed using a BALLN [place] EMERGE MR [id number]. Maximum pressure:  16 atm. Post-Intervention Lesion Assessment The intervention was successful. Pre-interventional TIMI flow is 0. Post-intervention TIMI flow is 3. No complications occurred at this lesion. There is a 0% residual stenosis post intervention.   STRESS TESTS  PCV MYOCARDIAL PERFUSION WITH LEXISCAN  03/11/2020  Narrative Lexiscan /modified Bruce protocol Sestamibi stress test 03/11/2020: No previous exam available for comparison. Lexiscan  nuclear stress test performed using 1-day protocol. Stress EKG is non-diagnostic, as this is pharmacological stress test using Lexiscan . Hypotension on noted during stress with lexiscan  and modified Bruce protocol exercise, reaching 59% MPHR. Myocardial perfusion imaging is normal. Left ventricular ejection fraction is  64% with normal wall motion. High risk study due to hypotensive response. Clinical correlation recommended.   ECHOCARDIOGRAM  ECHOCARDIOGRAM COMPLETE 07/26/2023  Narrative ECHOCARDIOGRAM REPORT    Patient Name:   Hager Mechling Date of Exam: 07/26/2023 Medical Rec #:  [id number]      Height:       70.0 in Accession #:    [id number]     Weight:       163.8  lb Date of Birth:  [birth year]      BSA:          1.918 m Patient Age:    62 years       BP:           104/65 mmHg Patient Gender: M              HR:           79 bpm. Exam Location:  Inpatient  Procedure: 2D Echo, Color Doppler, Cardiac  Doppler and Intracardiac Opacification Agent  Indications:    Acute ischemic heart disease  History:        Patient has prior history of Echocardiogram examinations, most recent 03/12/2020. CAD and Acute MI, Signs/Symptoms:Shortness of Breath and Chest Pain; Risk Factors:Hypertension, Diabetes, Dyslipidemia and Sleep Apnea.  Sonographer:    Clotilda Center Referring Phys: [phone] Caci Orren   Sonographer Comments: Image acquisition challenging due to respiratory motion. IMPRESSIONS   1. Left ventricular ejection fraction, by estimation, is 50 to 55%. The left ventricle has low normal function. The left ventricle demonstrates regional wall motion abnormalities with basal inferolateral and basal anterolateral akinesis. There is mild concentric left ventricular hypertrophy. Left ventricular diastolic parameters are consistent with Grade I diastolic dysfunction (impaired relaxation). 2. Right ventricular systolic function is normal. The right ventricular size is normal. Tricuspid regurgitation signal is inadequate for assessing PA pressure. 3. The mitral valve is normal in structure. Trivial mitral valve regurgitation. No evidence of mitral stenosis. 4. The aortic valve is tricuspid. There is mild calcification of the aortic valve. Aortic valve regurgitation is not visualized. No aortic stenosis is present. 5. The inferior vena cava is dilated in size with >50% respiratory variability, suggesting right atrial pressure of 8 mmHg.  FINDINGS Left Ventricle: Left ventricular ejection fraction, by estimation, is 50 to 55%. The left ventricle has low normal function. The left ventricle demonstrates regional wall motion abnormalities. Definity  contrast agent was given IV to delineate the left ventricular endocardial borders. The left ventricular internal cavity size was normal in size. There is mild concentric left ventricular hypertrophy. Left ventricular diastolic parameters are consistent with  Grade I diastolic dysfunction (impaired relaxation).  Right Ventricle: The right ventricular size is normal. No increase in right ventricular wall thickness. Right ventricular systolic function is normal. Tricuspid regurgitation signal is inadequate for assessing PA pressure.  Left Atrium: Left atrial size was normal in size.  Right Atrium: Right atrial size was normal in size.  Pericardium: There is no evidence of pericardial effusion.  Mitral Valve: The mitral valve is normal in structure. Trivial mitral valve regurgitation. No evidence of mitral valve stenosis. MV peak gradient, 2.9 mmHg. The mean mitral valve gradient is 1.0 mmHg.  Tricuspid Valve: The tricuspid valve is normal in structure. Tricuspid valve regurgitation is trivial.  Aortic Valve: The aortic valve is tricuspid. There is mild calcification of the aortic valve. Aortic valve regurgitation is not visualized. No aortic stenosis is present.  Pulmonic Valve: The pulmonic valve was normal in structure. Pulmonic valve regurgitation is not visualized.  Aorta: The aortic root is normal in size and structure.  Venous: The inferior vena cava is dilated in size with greater than 50% respiratory variability, suggesting right atrial pressure of 8 mmHg.  IAS/Shunts: No atrial level shunt detected by color flow Doppler.   LEFT VENTRICLE PLAX 2D LVIDd:         3.70 cm     Diastology LVIDs:  2.90 cm     LV e' medial:    4.68 cm/s LV PW:         1.90 cm     LV E/e' medial:  13.5 LV IVS:        1.20 cm     LV e' lateral:   4.68 cm/s LVOT diam:     2.40 cm     LV E/e' lateral: 13.5 LV SV:         75 LV SV Index:   39 LVOT Area:     4.52 cm  LV Volumes (MOD) LV vol d, MOD A2C: 68.7 ml LV vol d, MOD A4C: 58.7 ml LV vol s, MOD A4C: 27.3 ml LV SV MOD A4C:     58.7 ml  RIGHT VENTRICLE RV Basal diam:  3.20 cm RV S prime:     10.30 cm/s TAPSE (M-mode): 1.9 cm  LEFT ATRIUM             Index        RIGHT ATRIUM            Index LA diam:        4.00 cm 2.09 cm/m   RA Area:     12.55 cm LA Vol (A2C):   44.4 ml 23.16 ml/m  RA Volume:   30.70 ml  16.01 ml/m LA Vol (A4C):   36.8 ml 19.19 ml/m LA Biplane Vol: 41.1 ml 21.43 ml/m AORTIC VALVE LVOT Vmax:   88.60 cm/s LVOT Vmean:  66.600 cm/s LVOT VTI:    0.166 m  AORTA Ao Root diam: 3.20 cm Ao Asc diam:  3.20 cm  MITRAL VALVE MV Area (PHT): 2.60 cm    SHUNTS MV Area VTI:   3.17 cm    Systemic VTI:  0.17 m MV Peak grad:  2.9 mmHg    Systemic Diam: 2.40 cm MV Mean grad:  1.0 mmHg MV Vmax:       0.86 m/s MV Vmean:      56.4 cm/s MV Decel Time: 292 msec MR Peak grad: 17.1 mmHg MR Vmax:      206.50 cm/s MV E velocity: 63.10 cm/s MV A velocity: 64.70 cm/s MV E/A ratio:  0.98  Dalton McleanMD Electronically signed by Ezra Kanner Signature Date/Time: 07/26/2023/9:06:56 AM    Final   TEE  ECHO TEE 09/14/2024  Narrative TRANSESOPHOGEAL ECHO REPORT    Patient Name:   Bernice Lambe Date of Exam: 09/14/2024 Medical Rec #:  [id number]      Height:       70.0 in Accession #:    [id number]     Weight:       190.0 lb Date of Birth:  1961/01/28      BSA:          2.042 m Patient Age:    63 years       BP:           153/79 mmHg Patient Gender: M              HR:           82 bpm. Exam Location:  Inpatient  Procedure: Transesophageal Echo, 3D Echo, Cardiac Doppler and Color Doppler (Both Spectral and Color Flow Doppler were utilized during procedure).  Indications:     Watchman  History:         Patient has prior history of Echocardiogram examinations, most recent 07/26/2023. CAD and Previous Myocardial Infarction; Risk Factors:Hypertension and Diabetes.  Sonographer:     Jayson Gaskins Referring Phys:  [id number] FONDA KITTY Diagnosing Phys: Jerel Balding MD  PROCEDURE: After discussion of the risks and benefits of a TEE, an informed consent was obtained from the patient. The transesophogeal probe was passed without difficulty through the  esophogus of the patient. Sedation performed by different physician. The patient's vital signs; including heart rate, blood pressure, and oxygen saturation; remained stable throughout the procedure. The patient developed no complications during the procedure.  IMPRESSIONS   1. Left ventricular ejection fraction, by estimation, is 55 to 60%. The left ventricle has normal function. The left ventricle demonstrates regional wall motion abnormalities (see scoring diagram/findings for description). Left ventricular diastolic parameters are consistent with Grade II diastolic dysfunction (pseudonormalization). 2. Right ventricular systolic function is normal. The right ventricular size is normal. 3. At the end of the procedure there is a well-seated 24 mm Watchman FLX device in the left atrial appendage, with appropriate compression, no evidence of peri-device leak. Left atrial size was moderately dilated. No left atrial/left atrial appendage thrombus was detected. 4. The mitral valve is normal in structure. Mild mitral valve regurgitation. No evidence of mitral stenosis. 5. The aortic valve is normal in structure. Aortic valve regurgitation is not visualized. No aortic stenosis is present. 6. The inferior vena cava is normal in size with greater than 50% respiratory variability, suggesting right atrial pressure of 3 mmHg. 7. 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 8. 3D performed of the LAA and demonstrates appopriate size for a 24 mm Watchman FLX device. There is no evidence of post-procedure pericardial effusion.  Conclusion(s)/Recommendation(s): Normal biventricular function without evidence of hemodynamically significant valvular heart disease.  FINDINGS Left Ventricle: Left ventricular ejection fraction, by estimation, is 55 to 60%. The left ventricle has normal function. The left ventricle demonstrates regional wall motion abnormalities. The left  ventricular internal cavity size was normal in size. There is borderline concentric left ventricular hypertrophy. Left ventricular diastolic parameters are consistent with Grade II diastolic dysfunction (pseudonormalization).   LV Wall Scoring: The basal inferolateral segment is hypokinetic.  Right Ventricle: The right ventricular size is normal. No increase in right ventricular wall thickness. Right ventricular systolic function is normal.  Left Atrium: At the end of the procedure there is a well-seated 24 mm Watchman FLX device in the left atrial appendage, with appropriate compression, no evidence of peri-device leak. Left atrial size was moderately dilated. No left atrial/left atrial appendage thrombus was detected.  Right Atrium: Right atrial size was normal in size.  Pericardium: There is no evidence of pericardial effusion.  Mitral Valve: The mitral valve is normal in structure. Mild mitral valve regurgitation, with centrally-directed jet. No evidence of mitral valve stenosis.  Tricuspid Valve: The tricuspid valve is normal in structure. Tricuspid valve regurgitation is not demonstrated. No evidence of tricuspid stenosis.  Aortic Valve: The aortic valve is normal in structure. Aortic valve regurgitation is not visualized. No aortic stenosis is present.  Pulmonic Valve: The pulmonic valve was normal in structure. Pulmonic valve regurgitation is not visualized. No evidence of pulmonic stenosis.  Aorta: The aortic root, ascending aorta, aortic arch and descending aorta are all structurally normal, with no evidence of dilitation or obstruction. There is minimal (Grade I) plaque.  Venous: The inferior vena cava is normal in size with greater than 50% respiratory variability, suggesting right atrial pressure of 3 mmHg.  IAS/Shunts: No atrial level shunt detected by color flow Doppler. 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  Jerel Croitoru  MD Electronically signed by Jerel Balding MD Signature Date/Time: 09/14/2024/9:30:05 AM    Final  MONITORS  LONG TERM MONITOR (3-14 DAYS) 11/27/2024  Narrative Patch Wear Time:  9 days and 0 hours (2025-11-11T09:42:07-499 to 2025-11-20T10:39:44-0500)  HR 63 - 171, average 82 bpm. 17 nonsustained SVT (longest 20.8 seconds) and 1 nonsustained VT (longest 4 beats). No atrial fibrillation detected. Rare supraventricular ectopy. Rare ventricular ectopy. No sustained arrhythmias. No symptom trigger episodes.   Fonda Kitty Cardiac Electrophysiology   CT SCANS  CT CORONARY FRACTIONAL FLOW RESERVE DATA PREP 04/02/2020  Narrative CLINICAL DATA:  CAD  EXAM: FFR CT  TECHNIQUE: The best systolic and diastolic phases of the patients gated cardiac CT sent for hemodynamic analysis  FINDINGS: No FFR CT findings < 0.80  RCA 0.99  Circumflex 0.90  LAD 0.94 proximally 0.88 mid vessel 0.84 distally  D1: 0.89  IMPRESSION: FFR CT does not suggest hemodynamically significant lesions in LAD/Diagonal see above. Medical Rx and aggressive risk factor modification  With close cardiology f/u indicated  Maude Emmer   Electronically Signed By: Maude Emmer M.D. On: 04/02/2020 17:59   CT CORONARY MORPH W/CTA COR W/SCORE 04/02/2020  Addendum 04/02/2020  2:48 PM ADDENDUM REPORT: 04/02/2020 14:45  CLINICAL DATA:  Chest pain  EXAM: Cardiac CTA  MEDICATIONS: Sub lingual nitro. 4 mg and lopressor  5mg  iv  TECHNIQUE: The patient was scanned on a [employer] 192 scanner. Gantry rotation speed was 250 msecs. Collimation was. 6 mm . A 120 kV prospective scan was triggered in the ascending thoracic aorta at 140 HU's with full mA between 30-70% of the R-R interval . Average HR during the scan was 61 bpm. The 3D data set was interpreted on a dedicated work station using MPR, MIP and VRT modes. A total of 80 cc of contrast was used.  FINDINGS: Non-cardiac: See separate  report from [facility]. No significant findings on limited lung and soft tissue windows.  Calcium  score: 3 vessel calcium  noted most prominent in LAD  Coronary Arteries: Right dominant with no anomalies  LM: Normal  LAD: 50-69% mixed plaque with prominent soft plaque component in proximal vessel 50-69% calcific plaque in mid/distal vessel  D1: 25-49% soft plaque in ostial vessel  D2: Small vessel 50-69% ostial calcific plaque  D3: Small vessel  Circumflex: 1-24% soft plaque in mid vessel  OM1: Normal  AV Groove: Normal  RCA: 1-24% calcified ostial stenosis  PDA: Normal  PLB: Normal  IMPRESSION: 1. Calcium  score 95 which is 76 th percentile for age and sex  2.  Normal aortic root 2.9 cm  3.  CAD CAD RADS 3 most significant in LAD and diagonal  4.  Study sent for FFR CT  Maude Emmer   Electronically Signed By: Maude Emmer M.D. On: 04/02/2020 14:45  Narrative EXAM: OVER-READ INTERPRETATION  CT CHEST  The following report is an over-read performed by radiologist Dr. Marcey Moan of [facility] on 04/02/2020. This over-read does not include interpretation of cardiac or coronary anatomy or pathology. The coronary CTA interpretation by the cardiologist is attached.  COMPARISON:  None.  FINDINGS: Vascular: No incidental findings.  Mediastinum/Nodes: Visualized mediastinum and hilar regions demonstrate no lymphadenopathy or masses. Suggestion of mild circumferential thickening of the distal esophagus with probable small hiatal hernia. Correlation suggested with any symptoms chronic gastroesophageal reflux or esophageal inflammation.  Lungs/Pleura: Visualized lungs show no evidence of pulmonary  edema, consolidation, pneumothorax, nodule or pleural fluid.  Upper Abdomen: No acute abnormality.  Musculoskeletal: No chest wall mass or suspicious bone lesions identified.  IMPRESSION: Suggestion of mild circumferential thickening  of the distal esophagus with probable small hiatal hernia. Correlation suggested with any symptoms of chronic gastroesophageal reflux or esophageal inflammation.  Electronically Signed: By: Marcey Moan M.D. On: 04/02/2020 14:14     ______________________________________________________________________________________________      EKG:        Recent Labs: 01/26/2024: TSH 1.60 04/11/2024: ALT 27; B Natriuretic Peptide 99.4 10/31/2024: BUN 24; Creatinine, Ser 1.20; Hemoglobin 16.5; Platelets 277; Potassium 4.4; Sodium 145  Recent Lipid Panel    Component Value Date/Time   CHOL 53 (L) 03/13/2024 1118   TRIG 86 03/13/2024 1118   HDL 25 (L) 03/13/2024 1118   CHOLHDL 2.1 03/13/2024 1118   CHOLHDL 6.5 07/25/2023 1316   VLDL 22 07/25/2023 1316   LDLCALC 10 03/13/2024 1118   LDLDIRECT 93 08/20/2009 2019           Physical Exam:    VS:  BP (!) 130/90 (BP Location: Left Arm, Patient Position: Sitting, Cuff Size: Normal)   Pulse 74   Ht 5' 10 (1.778 m)   Wt 200 lb (90.7 kg)   SpO2 95%   BMI 28.70 kg/m     Wt Readings from Last 3 Encounters:  12/08/24 200 lb (90.7 kg)  12/06/24 196 lb (88.9 kg)  10/31/24 187 lb (84.8 kg)     GEN:  Well nourished, well developed in no acute distress HEENT: Normal NECK: No JVD; No carotid bruits LYMPHATICS: No lymphadenopathy CARDIAC: RRR, no murmurs, rubs, gallops RESPIRATORY:  Clear to auscultation without rales, wheezing or rhonchi  ABDOMEN: Soft, non-tender, non-distended MUSCULOSKELETAL:  No edema; No deformity  SKIN: Warm and dry NEUROLOGIC:  Alert and oriented x 3 PSYCHIATRIC:  Normal affect   Assessment & Plan Paroxysmal atrial fibrillation (HCC) Patient followed by EP.  He has undergone watchman implantation.  Follow-up EP per protocol.  Off of oral anticoagulation. Coronary artery disease involving native coronary artery of native heart without angina pectoris Stable without angina.  Treated with aspirin ,  atorvastatin , and Repatha .  Patient most recently underwent PCI of the LAD in November 2024.  Once he is done with the 46-month post-watchman regimen, he could go on clopidogrel  monotherapy. Primary hypertension Treated with amlodipine , carvedilol , and losartan .  Blood pressure is above goal.  Increase amlodipine  to 10 mg daily. Pure hypercholesterolemia  Patient on atorvastatin  and Repatha .  LDL cholesterol is 10.           Medication Adjustments/Labs and Tests Ordered: Current medicines are reviewed at length with the patient today.  Concerns regarding medicines are outlined above.  No orders of the defined types were placed in this encounter.  Meds ordered this encounter  Medications   amLODipine  (NORVASC ) 10 MG tablet    Sig: Take 1 tablet (10 mg total) by mouth daily.    Dispense:  90 tablet    Refill:  3    Patient Instructions  Medication Instructions:  Increase Amlodipine  to 10 mg once daily *If you need a refill on your cardiac medications before your next appointment, please call your pharmacy*  Lab Work: NONE If you have labs (blood work) drawn today and your tests are completely normal, you will receive your results only by: MyChart Message (if you have MyChart) OR A paper copy in the mail If you have any lab test that is abnormal  or we need to change your treatment, we will call you to review the results.  Testing/Procedures: NONE  Follow-Up: At [facility], you and your health needs are our priority.  As part of our continuing mission to provide you with exceptional heart care, our providers are all part of one team.  This team includes your primary Cardiologist (physician) and Advanced Practice Providers or APPs (Physician Assistants and Nurse Practitioners) who all work together to provide you with the care you need, when you need it.  Your next appointment:   6 months with APP 1 year with Wonda, MD  We recommend signing up for the patient  portal called MyChart.  Sign up information is provided on this After Visit Summary.  MyChart is used to connect with patients for Virtual Visits (Telemedicine).  Patients are able to view lab/test results, encounter notes, upcoming appointments, etc.  Non-urgent messages can be sent to your provider as well.   To learn more about what you can do with MyChart, go to [email or web address].     Signed, Ozell Wonda, MD  12/08/2024 3:57 PM    [facility]     [1]  Current Meds  Medication Sig   acetaminophen  (TYLENOL ) 500 MG tablet Take 1 tablet (500 mg total) by mouth every 6 (six) hours. Take every 6 hrs x 2-3 days then take every 6 hrs as needed   amLODipine  (NORVASC ) 10 MG tablet Take 1 tablet (10 mg total) by mouth daily.   aspirin  EC 81 MG tablet Take 1 tablet (81 mg total) by mouth daily. Swallow whole.   carvedilol  (COREG ) 25 MG tablet TAKE 1 TABLET(25 MG) BY MOUTH TWICE DAILY   cetirizine  (ZYRTEC ) 10 MG tablet Take 10 mg by mouth daily.   clopidogrel  (PLAVIX ) 75 MG tablet Take 1 tablet (75 mg total) by mouth daily.   Continuous Glucose Sensor (DEXCOM G7 SENSOR) MISC Change sensor every 10 days   Continuous Glucose Sensor (DEXCOM G7 SENSOR) MISC Change every 10 days   empagliflozin  (JARDIANCE ) 25 MG TABS tablet Take 1 tablet (25 mg total) by mouth daily before breakfast.   insulin  glargine (LANTUS  SOLOSTAR) 100 UNIT/ML Solostar Pen Inject 50 Units into the skin daily. (Patient taking differently: Inject 40 Units into the skin at bedtime.)   insulin  lispro (HUMALOG  KWIKPEN) 200 UNIT/ML KwikPen Max daily 120 units (Patient taking differently: Inject 36 Units into the skin 3 (three) times daily before meals.)   Insulin  Pen Needle (UNIFINE PENTIPS) 32G X 4 MM MISC 1 Device by Other route in the morning, at noon, in the evening, and at bedtime.   Insulin  Pen Needle 32G X 4 MM MISC 1 Device by Does not apply route in the morning, at noon, in the evening, and at bedtime.    ipratropium (ATROVENT ) 0.03 % nasal spray Place 2 sprays into both nostrils 3 (three) times daily as needed for rhinitis.   losartan  (COZAAR ) 100 MG tablet Take 1 tablet (100 mg total) by mouth daily.   nitroGLYCERIN  (NITROSTAT ) 0.4 MG SL tablet DISSOLVE 1 TABLET UNDER THE TONGUE EVERY 5 MINUTES AS NEEDED FOR CHEST PAIN   pantoprazole  (PROTONIX ) 40 MG tablet Take 1 tablet (40 mg total) by mouth daily.   REPATHA  SURECLICK 140 MG/ML SOAJ ADMINISTER 1 ML UNDER THE SKIN EVERY 14 DAYS.   rOPINIRole  (REQUIP ) 2 MG tablet Take 2 mg by mouth at bedtime.   Semaglutide , 2 MG/DOSE, 8 MG/3ML SOPN Inject 2 mg as directed once a week. (Patient  taking differently: Inject 2 mg as directed every Saturday.)   triamcinolone  cream (KENALOG ) 0.1 % Apply 1 Application topically 2 (two) times daily. (Patient taking differently: Apply 1 Application topically 2 (two) times daily as needed (rash).)   vortioxetine  HBr (TRINTELLIX ) 20 MG TABS tablet Take 1 tablet (20 mg total) by mouth daily.   [DISCONTINUED] amLODipine  (NORVASC ) 5 MG tablet Take 1 tablet (5 mg total) by mouth daily.   [DISCONTINUED] atorvastatin  (LIPITOR) 40 MG tablet Take 40 mg by mouth daily.

## 2024-12-08 NOTE — Patient Instructions (Signed)
 Medication Instructions:  Increase Amlodipine  to 10 mg once daily *If you need a refill on your cardiac medications before your next appointment, please call your pharmacy*  Lab Work: NONE If you have labs (blood work) drawn today and your tests are completely normal, you will receive your results only by: MyChart Message (if you have MyChart) OR A paper copy in the mail If you have any lab test that is abnormal or we need to change your treatment, we will call you to review the results.  Testing/Procedures: NONE  Follow-Up: At [facility], you and your health needs are our priority.  As part of our continuing mission to provide you with exceptional heart care, our providers are all part of one team.  This team includes your primary Cardiologist (physician) and Advanced Practice Providers or APPs (Physician Assistants and Nurse Practitioners) who all work together to provide you with the care you need, when you need it.  Your next appointment:   6 months with APP 1 year with Wonda, MD  We recommend signing up for the patient portal called MyChart.  Sign up information is provided on this After Visit Summary.  MyChart is used to connect with patients for Virtual Visits (Telemedicine).  Patients are able to view lab/test results, encounter notes, upcoming appointments, etc.  Non-urgent messages can be sent to your provider as well.   To learn more about what you can do with MyChart, go to [email or web address].

## 2024-12-12 ENCOUNTER — Ambulatory Visit: Admitting: Neurology

## 2024-12-12 DIAGNOSIS — G4733 Obstructive sleep apnea (adult) (pediatric): Secondary | ICD-10-CM

## 2024-12-12 DIAGNOSIS — J189 Pneumonia, unspecified organism: Secondary | ICD-10-CM

## 2024-12-12 DIAGNOSIS — Z789 Other specified health status: Secondary | ICD-10-CM

## 2024-12-12 DIAGNOSIS — R0602 Shortness of breath: Secondary | ICD-10-CM

## 2024-12-12 DIAGNOSIS — Z9682 Presence of neurostimulator: Secondary | ICD-10-CM

## 2024-12-15 ENCOUNTER — Ambulatory Visit: Payer: Self-pay | Admitting: Neurology

## 2024-12-15 DIAGNOSIS — Z789 Other specified health status: Secondary | ICD-10-CM | POA: Insufficient documentation

## 2024-12-15 NOTE — Procedures (Signed)
 [facility] Insp[facility] Report    General Information  Name: [name] [name], [name] [name] BMI: 28 Physician: Dedra Gores, MD  ID: [id number] Height: 70 in Technician: [name] [name]  Sex: Male Weight: 196 lb Record: [id number]  Age: 63 [05/26/1961] Date: 12/12/2024 Scorer: [name] [name]   Recommended Settings IPAP: N/A cmH20 EPAP: N/A cmH2O AHI: N/A AHI (4%): N/A   Volt Volt 00 0.9 1.0 1.1 1.2 1.3 1.4 1.5   O2 Vol 0.0 0.0 0.0 0.0 0.0 0.0 0.0 0.0  Time TRT 78.58m 26.83m 30.51m 30.86m 53.51m 20.12m 19.2m 19.41m   TST 30.23m 24.72m 27.101m 28.27m 35.12m 17.38m 19.33m 18.52m  Sleep Stage % Wake 61.8 9.4 11.5 8.2 33.6 15.0 0.0 2.6   % REM 0.0 0.0 0.0 0.0 0.0 5.9 0.0 0.0   % N1 16.7 6.3 3.7 10.7 9.9 29.4 0.0 0.0   % N2 83.3 93.8 96.3 89.3 90.1 64.7 100.0 100.0   % N3 0.0 0.0 0.0 0.0 0.0 0.0 0.0 0.0  Respiratory Total Events 17 18 17 27  34 20 25 23    Obs. Apn. 1 0 0 0 13 11 10 6    Mixed Apn. 0 0 0 0 1 0 0 0   Cen. Apn. 0 0 0 0 0 0 0 0   Hypopneas 16 18 17 27 20 9 15 17    AHI 34.00 45.00 37.78 57.86 57.46 70.59 76.92 74.59   Supine AHI 0.00 0.00 0.00 0.00 0.00 0.00 0.00 0.00   Prone AHI 0.00 0.00 0.00 0.00 0.00 66.67 0.00 0.00   Side AHI 34.00 45.00 37.78 57.86 57.46 72.00 76.92 74.59  Respiratory (4%) Hypopneas (4%) 1.00 7.00 2.00 21.00 15.00 4.00 15.00 16.00   AHI (4%) 4.00 17.50 4.44 45.00 49.01 52.94 76.92 71.35   Supine AHI (4%) 0.00 0.00 0.00 0.00 0.00 0.00 0.00 0.00   Prone AHI (4%) 0.00 0.00 0.00 0.00 0.00 26.67 0.00 0.00   Side AHI (4%) 4.00 17.50 4.44 45.00 49.01 62.40 76.92 71.35  Desat Profile <= 90% 8.54m 0.41m 0.8m 0.69m 8.18m 0.7m 4.64m 4.82m   <= 80% 0.67m 0.5m 0.53m 0.86m 3.60m 0.15m 0.76m 0.25m   <= 70% 0.39m 0.11m 0.60m 0.74m 3.36m 0.86m 0.59m 0.39m   <= 60% 0.69m 0.76m 0.4m 0.42m 3.66m 0.13m 0.55m 0.37m  Arousal Index Apnea 0.0 0.0 0.0 0.0 15.2 14.1 0.0 3.2   Hypopnea 16.0 20.0 2.2 12.9 15.2 10.6 6.2 6.5   LM 2.0 0.0 2.2 0.0 0.0 0.0 0.0 0.0   Spontaneous 16.0 22.5 8.9 10.7  25.4 14.1 0.0 0.0   Volt Volt 1.6 1.7 1.8   O2 Vol 0.0 0.0 0.0  Time TRT 18.57m 77.33m 36.68m   TST 18.52m 71.52m 36.4m  Sleep Stage % Wake 0.0 8.4 0.0   % REM 0.0 27.5 0.0   % N1 0.0 9.9 0.0   % N2 100.0 62.7 100.0   % N3 0.0 0.0 0.0  Respiratory Total Events 21 23 2    Obs. Apn. 2 3 1    Mixed Apn. 0 0 0   Cen. Apn. 0 0 0   Hypopneas 19 20 1    AHI 68.11 19.44 3.29   Supine AHI 0.00 0.00 0.00   Prone AHI 0.00 0.00 0.00   Side AHI 68.11 19.44 3.29  Respiratory (4%) Hypopneas (4%) 19.00 8.00 1.00   AHI (4%) 68.11 9.30 3.29   Supine AHI (4%) 0.00 0.00 0.00   Prone AHI (4%) 0.00 0.00 0.00   Side AHI (4%) 68.11 9.30 3.29  Desat  Profile <= 90% 1.95m 0.29m 0.45m   <= 80% 0.10m 0.42m 0.48m   <= 70% 0.13m 0.26m 0.24m   <= 60% 0.43m 0.12m 0.85m  Arousal Index Apnea 0.0 0.8 0.0   Hypopnea 9.7 2.5 0.0   LM 0.0 0.8 0.0   Spontaneous 0.0 2.5 0.0    [facility] Report    Results Nocturnal polysomnography (Order [id number])   Nocturnal polysomnography for hypoglossal stimulator titration.   Order: [id number]   CHALICE SAUNAS, MD          12/15/2024 01:03:31 PM PATIENT'S NAME: [name] [name], [name] [name] DOB: 24-Jun-1961 MR#: [id number] DATE OF RECORDING: 12/12/2024 REFERRING M.D.: Dr. KIP Study Performed: Polysomnogram with Inspire Titration HISTORY: 63 year old right-handed Male patient with an underlying complex medical history . [name] is a 63 y.o. male patient who is followed at [facility] for OSA, last  revisit 10/11/2024 for activation of newly implanted INSPIRE device, by [facility]- ENT  surgeon  Dr .Soldatova. Implantation took place on 08-09-2024, he had a post surgery complication of severe bruising. He is chronically anticoagulated due to paroxysmal atrial fib, had a trans-oesophageal Echo on 09-14-2024, for a watchman procedure. 09-14-2024. Was on Eloquis, but had stopped plavix  before. He is diabetic. He has been treated for depression.  He has a BMI of  28. Large neck, small upper airway, restricted airway.  ENT surgery referral was meant  for evaluation and treatment , improvement airway patency, thus  helping PAP tolerance, to address sinus disease and nasal septum, but the patient requested ENT evaluation for Inspire , he had not been using CPAP/ BiPAP since 2020 due to sinusitis. He had 2 times atypical pneumonia since Spring 2025.  A HST was positive for OSA, not REM sleep dependent and without associated hypoxia, allowing alternative therapies to PAP to be considered.  We did not obtain an in- lab study. SABRAHe owns a BiPAP machine at home , not used for years. He was on level 1 today, 0.7 volts, instructions given for weekly small step increases in voltage to 1.7 Volts at home prior to in-lab titration.  CURRENT MEDICATIONS: Tylenol , Norvasc , Aspirin , Lipitor, Coreg , Plavix , Jardiance , Humalog  Kwik-pen, Atrovent , Cozaar , Nitrostat , Protonix , Requip , Flomax , Kenalog , Trintellix , Xyzal  TECHNICAL PROTOCOL : This is a multichannel digital polysomnogram utilizing the Polysmith 12.0 system. Electrodes and sensors were applied and monitored per AASM Specifications. EEG, EOG, Chin and Limb EMG, were sampled at 200 Hz. ECG, Snore and Nasal Pressure, Thermal Airflow, Respiratory Effort, CPAP Flow and Pressure, Oximetry was sampled at 50 Hz. Digital video and audio were recorded. A RPSGT attended this study. BASELINE STUDY: Elizabeth was turned on after the patient fell asleep with a starting amplitude at 0.9 V , below the currently used 1.1 Volts , the level 5 of his home setting.  Inspire was titrated to a maximum amplitude of  1.8V Volts. The titration was remarkable in that the AHI was more severe under  inspire than at the last baseline HST.  THe AHI was indicating severe apnea with at 45/h under 0.9 V and continued with increasing voltage to rise to an AHI of  77/h (!) at  1.4V-   Only after reaching 1.7 Volts output  was there a decrease of the AHI to 19.4/h  noted ( 71 minutes of sleep time, no supine sleep but REM ) and under final setting of 1.8 V to  AHI of 3.3 /h seen ( 37 minutes of sleep- all  non supine and without  REM ) .     Lights Out was at 22:04 and Lights On at 04:55. Total recording time (TRT) was 410.5 minutes, with a total sleep time (TST) of 325.5 minutes. The patient's sleep latency was 32.5 minutes. REM latency was 202.0 minutes. The sleep efficiency was 79.3%. WASO (Wake after sleep onset) was 52.5 minutes , moderate sleep fragmentation noted. SLEEP ARCHITECTURE:  There were 26.0 minutes in Stage N1, 279.0 minutes Stage N2, 0.0 minutes Stage N3 and 20.5 minutes in Stage REM. The percentage of Stage N1 was 8.0%, Stage N2 was 85.7%, Stage N3 was 0.0% and Stage R (REM sleep) was 6.3%.   RESPIRATORY ANALYSIS: 3%  AASM criteria were applied.   There were a total of 227 respiratory events: 47 obstructive apneas, 0 central apneas and 1 mixed apnea with a total of 48 apneas and an apnea index (AI) of 8.8 /hour.  There were 179 hypopneas with a hypopnea index of 33.0 /hour. The total APNEA/HYPOPNEA INDEX (AHI) was 41.8 /hour. The REM AHI was 8.8 /hour, versus a non-REM AHI of 44.1/h .   The patient spent 0.0 minutes of total sleep time in the supine, leaving an all- non-supine AHI of 41.8/h.  OXYGEN SATURATION & CO2:     The baseline O saturation was 96.0%, with the lowest being 81.0. Time spent below 89% saturation equaled 8.2 minutes.   PERIODIC LIMB MOVEMENTS:  The patient had a total of 0 Periodic Limb Movements.   AUDIO/ VIDEO : No abnormal or unusual movements, behaviors, phonation or vocalizations. The patient took one bathroom break. only mild snoring was captured in the audio recording.  EKG :The EKG was regular, presumed normal sinus rhythm (NSR) and showed frequent multifocal  PVC. Heart rate ranged from 69 bpm to 94 bpm. Mean heart rate was 77 bpm.   IMPRESSION: very delayed response by AHI to Inspire Voltage increases, the  final explored setting of 1.8 V was finally able to reduce the AHI to singe digits.  RECOMMENDATIONS: At home titration to 1.8V and may be even higher , week by week from a starting point currently at 1.1V.  The patient  indicated some discomfort at the tongue after waking up in AM.     I certify that I have reviewed the entire raw data recording prior to the issuance of this report in accordance with the Standards of Accreditation of the American Academy of Sleep Medicine (AASM). DEDRA GORES, MD  DIPLOMAT of the AASM, ABPN and [facility] [place] [facility].

## 2024-12-24 ENCOUNTER — Other Ambulatory Visit: Payer: Self-pay | Admitting: Cardiovascular Disease

## 2024-12-26 ENCOUNTER — Telehealth: Payer: Self-pay | Admitting: Pharmacy Technician

## 2024-12-26 DIAGNOSIS — I2510 Atherosclerotic heart disease of native coronary artery without angina pectoris: Secondary | ICD-10-CM

## 2024-12-26 DIAGNOSIS — E7800 Pure hypercholesterolemia, unspecified: Secondary | ICD-10-CM

## 2024-12-26 NOTE — Telephone Encounter (Signed)
 Pharmacy Patient Advocate Encounter   Received notification from CoverMyMeds that prior authorization for repatha  is required/requested.   Insurance verification completed.   The patient is insured through [facility] MEDICAID.   Per test claim: PA required; PA submitted to above mentioned insurance via Latent Key/confirmation #/EOC [id number] Status is pending

## 2024-12-27 NOTE — Addendum Note (Signed)
 Addended by: LORRENE FEDERICO CROME on: 12/27/2024 03:12 PM   Modules accepted: Orders

## 2024-12-27 NOTE — Telephone Encounter (Signed)
 Pharmacy Patient Advocate Encounter  Received notification from [facility] MEDICAID that Prior Authorization for repatha  has been APPROVED from 12/27/24 to 12/26/25   PA #/Case ID/Reference #: [id number]

## 2025-01-03 ENCOUNTER — Telehealth: Payer: Self-pay | Admitting: Neurology

## 2025-01-03 ENCOUNTER — Ambulatory Visit (INDEPENDENT_AMBULATORY_CARE_PROVIDER_SITE_OTHER): Admitting: Neurology

## 2025-01-03 ENCOUNTER — Encounter: Payer: Self-pay | Admitting: Neurology

## 2025-01-03 VITALS — BP 129/68 | HR 68 | Ht 70.0 in | Wt 208.0 lb

## 2025-01-03 DIAGNOSIS — G4733 Obstructive sleep apnea (adult) (pediatric): Secondary | ICD-10-CM | POA: Diagnosis not present

## 2025-01-03 DIAGNOSIS — I480 Paroxysmal atrial fibrillation: Secondary | ICD-10-CM

## 2025-01-03 DIAGNOSIS — Z9682 Presence of neurostimulator: Secondary | ICD-10-CM

## 2025-01-03 NOTE — Progress Notes (Addendum)
 "        Provider:  Dedra Gores, MD  Primary Care Physician:  Kip Righter, MD [address] [place] [address]     Referring Provider:  [place] , [place] Referring Provider:  Orlan Cramp, DO at [facility]        Chief Complaint according to patient   Patient presents with:                HISTORY OF PRESENT ILLNESS:  [name] [name] is a 64 y.o. male patient who is here for revisit 01/03/2025 for  Inspire , and seems frustrated. He remembers how uncomfortable the last settings on his device were for him while in the lab. There had been problems with the magnet sliding off his chest, etc. .    Chief concern according to patient :  follow up on inspire titration in the lab, see below.    DATE OF RECORDING: 12/12/2024 REFERRING M.D.: Dr. KIP Study Performed: Polysomnogram with Inspire Titration HISTORY: 64 year old right-handed Male patient with an underlying complex medical history; atrial fib, nasal airway restriction, sinusitis ,  . [name] [name] is a 64 y.o. male patient who is followed at [facility] for OSA, last  revisit 10/11/2024 for activation of newly implanted INSPIRE device, by [facility]- ENT  surgeon  Dr .Soldatova.  Implantation took place on 08-09-2024, he had a post surgery complication of severe bruising. He is chronically anticoagulated due to paroxysmal atrial fib, had a trans-oesophageal Echo on 09-14-2024, for a watchman procedure. 09-14-2024. Was on Eloquis, but had stopped plavix  before. He is diabetic. He has been treated for depression.  He has a BMI of 28. Large neck, small upper airway, restricted airway.   ENT surgery referral was meant  for evaluation and treatment , improvement airway patency, thus  helping PAP tolerance, to address sinus disease and nasal septum, but the patient requested ENT evaluation for Inspire , he had not been using CPAP/ BiPAP since 2020 due to recurrent  sinusitis he flet was likely fueled by CPAP use. UPPP  history. EDS. [name]  He had 2 times atypical pneumonia since Spring 2025.  A HST was positive for OSA, not REM sleep dependent and without associated hypoxia, allowing alternative therapies to PAP to be considered.    We did not obtain an in- lab study. SABRAHe owns a BiPAP machine at home , not used for years. He was on level 1 today, 0.7 volts, instructions given for weekly small step increases in voltage to 1.7 Volts at home prior to in-lab titration.  CURRENT MEDICATIONS: Tylenol , Norvasc , Aspirin , Lipitor, Coreg , Plavix , Jardiance , Humalog  Kwik-pen, Atrovent , Cozaar , Nitrostat , Protonix , Requip , Flomax , Kenalog , Trintellix , Xyzal  TECHNICAL PROTOCOL : This is a multichannel digital polysomnogram utilizing the Polysmith 12.0 system. Electrodes and sensors were applied and monitored per AASM Specifications. EEG, EOG, Chin and Limb EMG, were sampled at 200 Hz. ECG, Snore and Nasal Pressure, Thermal Airflow, Respiratory Effort, CPAP Flow and Pressure, Oximetry was sampled at 50 Hz. Digital video and audio were recorded. A RPSGT attended this study.  BASELINE STUDY: Elizabeth was turned on after the patient fell asleep with a starting amplitude at 0.9 V , below the currently used 1.1 Volts , the level 5 of his home setting.  Inspire was titrated to a maximum amplitude of  1.8V Volts. The titration was remarkable in that the AHI was more severe under  inspire than at the last baseline HST.  THe AHI was indicating  severe apnea with at 45/h under 0.9 V and continued with increasing voltage to rise to an AHI of  77/h (!) at  1.4V-   Only after reaching 1.7 Volts output  was there a decrease of the AHI to 19.4/h noted ( 71 minutes of sleep time, no supine sleep but REM ) and under final setting of 1.8 V to  AHI of 3.3 /h seen ( 37 minutes of sleep- all  non supine and without REM ) .     RESPIRATORY ANALYSIS: 3%  AASM criteria were applied.   There were a total of 227 respiratory events: 47 obstructive apneas, 0  central apneas and 1 mixed apnea with a total of 48 apneas and an apnea index (AI) of 8.8 /hour.  There were 179 hypopneas with a hypopnea index of 33.0 /hour. The total APNEA/HYPOPNEA INDEX (AHI) was 41.8 /hour. The REM AHI was 8.8 /hour, versus a non-REM AHI of 44.1/h .    The patient spent 0.0 minutes of total sleep time in the supine, leaving an all- non-supine AHI of 41.8/h.   OXYGEN SATURATION & CO2:     The baseline O saturation was 96.0%, with the lowest being 81.0. Time spent below 89% saturation equaled 8.2 minutes.    Review of Systems: Out of a complete 14 system review, the patient complains of only the following symptoms, and all other reviewed systems are negative.:   SLEEPINESS ?  How likely are you to doze in the following situations: 0 = not likely, 1 = slight chance, 2 = moderate chance, 3 = high chance  Sitting and Reading? Watching Television? Sitting inactive in a public place (theater or meeting)? Lying down in the afternoon when circumstances permit? Sitting and talking to someone? Sitting quietly after lunch without alcohol? In a car, while stopped for a few minutes in traffic? As a passenger in a car for an hour without a break?  Total = see attached .        Social History   Socioeconomic History   Marital status: Single    Spouse name: Not on file   Number of children: Not on file   Years of education: Not on file   Highest education level: Not on file  Occupational History   Not on file  Tobacco Use   Smoking status: Former    Current packs/day: 0.00    Average packs/day: 1 pack/day for 10.0 years (10.0 ttl pk-yrs)    Types: Cigarettes    Start date: 04/20/1982    Quit date: 04/20/1992    Years since quitting: 32.7   Smokeless tobacco: Never  Vaping Use   Vaping status: Never Used  Substance and Sexual Activity   Alcohol use: No   Drug use: No   Sexual activity: Yes  Other Topics Concern   Not on file  Social History Narrative    Not on file   Social Drivers of Health   Tobacco Use: Medium Risk (12/08/2024)   Patient History    Smoking Tobacco Use: Former    Smokeless Tobacco Use: Never    Passive Exposure: Not on Actuary Strain: Not on file  Food Insecurity: No Food Insecurity (09/14/2024)   Epic    Worried About Programme Researcher, Broadcasting/film/video in the Last Year: Never true    Ran Out of Food in the Last Year: Never true  Transportation Needs: Unmet Transportation Needs (09/14/2024)   Epic    Lack of Transportation (Medical):  Yes    Lack of Transportation (Non-Medical): No  Physical Activity: Not on file  Stress: Not on file  Social Connections: Unknown (05/11/2022)   [facility]   Social Network    Social Network: Not on file  Depression (PHQ2-9): High Risk (10/15/2023)   Depression (PHQ2-9)    PHQ-2 Score: 18  Alcohol Screen: Not on file  Housing: Low Risk (09/14/2024)   Epic    Unable to Pay for Housing in the Last Year: No    Number of Times Moved in the Last Year: 0    Homeless in the Last Year: No  Utilities: At Risk (09/14/2024)   Epic    Threatened with loss of utilities: Yes  Health Literacy: Adequate Health Literacy (04/12/2024)   B1300 Health Literacy    Frequency of need for help with medical instructions: Never    Family History  Problem Relation Age of Onset   Cancer Mother        lung   Cancer Father        prostate, mets to bone, lung   Cancer Brother        melanoma   Heart disease Maternal Grandmother        MI   Lung disease Maternal Grandfather        black lung   Heart disease Paternal Grandmother    Stroke Paternal Grandmother    Heart disease Paternal Grandfather    Heart disease Brother 23       MI   Diabetes Brother     Past Medical History:  Diagnosis Date   ADHD (attention deficit hyperactivity disorder)    Anxiety    Coronary artery disease    06/2023 - PCI to circumflex and pLAD PCI 11/10/23   Depression    Diabetes mellitus     Type 2   Dyspnea    Dysrhythmia    A. 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  [place], [place]   CORONARY STENT INTERVENTION N/A 11/10/2023   Procedure: CORONARY STENT INTERVENTION;  Surgeon: Wonda Sharper, MD;  Location: [facility] INVASIVE CV LAB;  Service: Cardiovascular;  Laterality: N/A;   CORONARY ULTRASOUND/IVUS N/A 11/10/2023   Procedure: Coronary Ultrasound/IVUS;  Surgeon: Wonda Sharper, MD;  Location: [facility] INVASIVE CV LAB;  Service: Cardiovascular;  Laterality: N/A;   CORONARY/GRAFT ACUTE MI REVASCULARIZATION N/A 07/25/2023   Procedure: Coronary/Graft Acute MI Revascularization;  Surgeon: Wonda Sharper, MD;  Location: [facility] INVASIVE CV LAB;  Service: Cardiovascular;  Laterality: N/A;   DRUG INDUCED ENDOSCOPY N/A 05/10/2024   Procedure: DRUG  INDUCED SLEEP ENDOSCOPY;  Surgeon: Okey Burns, MD;  Location: [facility] OR;  Service: ENT;  Laterality: N/A;   IMPLANTATION OF HYPOGLOSSAL NERVE STIMULATOR N/A 08/09/2024   Procedure: INSERTION, HYPOGLOSSAL NERVE STIMULATOR;  Surgeon: Okey Burns, MD;  Location: [facility] OR;  Service: ENT;  Laterality: N/A;   KNEE ARTHROSCOPY Left     LEFT ATRIAL APPENDAGE OCCLUSION N/A 09/14/2024   Procedure: LEFT ATRIAL APPENDAGE OCCLUSION;  Surgeon: Kennyth Chew, MD;  Location: [facility] INVASIVE CV LAB;  Service: Cardiovascular;  Laterality: N/A;   LEFT HEART CATH AND CORONARY ANGIOGRAPHY N/A 07/25/2023   Procedure: LEFT HEART CATH AND CORONARY ANGIOGRAPHY;  Surgeon: Wonda Sharper, MD;  Location: [facility] INVASIVE CV LAB;  Service: Cardiovascular;  Laterality: N/A;   RENAL ANGIOGRAPHY N/A 07/16/2020   Procedure: RENAL ANGIOGRAPHY;  Surgeon: Ladona Heinz, MD;  Location: [facility] INVASIVE CV LAB;  Service: Cardiovascular;  Laterality: N/A;   SACROILIAC JOINT FUSION Right 05/02/2014   Procedure: SACROILIAC JOINT FUSION;  Surgeon: Oneil Rodgers Priestly, MD;  Location: [facility] OR;  Service: Orthopedics;  Laterality: Right;  Right sided sacroiliac joint fusion   TONSILLECTOMY  2013    uvula tacked up per pt   TRANSESOPHAGEAL ECHOCARDIOGRAM (CATH LAB) N/A 09/14/2024   Procedure: TRANSESOPHAGEAL ECHOCARDIOGRAM;  Surgeon: Kennyth Chew, MD;  Location: [facility] INVASIVE CV LAB;  Service: Cardiovascular;  Laterality: N/A;     Medications Ordered Prior to Encounter[1]  Allergies[2]   DIAGNOSTIC DATA (LABS, IMAGING, TESTING) - I reviewed patient records, labs, notes, testing and imaging myself where available.  Lab Results  Component Value Date   WBC 9.0 10/31/2024   HGB 16.5 10/31/2024   HCT 48.8 10/31/2024   MCV 95 10/31/2024   PLT 277 10/31/2024      Component Value Date/Time   NA 145 (H) 10/31/2024 1034   K 4.4 10/31/2024 1034   CL 105 10/31/2024 1034   CO2 24 10/31/2024 1034   GLUCOSE 106 (H) 10/31/2024 1034   GLUCOSE 134 (H) 08/03/2024 1026   BUN 24 10/31/2024 1034   CREATININE 1.20 10/31/2024 1034   CALCIUM  10.2 10/31/2024 1034   PROT 7.4 04/11/2024 1048   PROT 7.1 09/21/2023 0936   ALBUMIN 3.5 04/11/2024 1048   ALBUMIN 4.5 09/21/2023 0936   AST 25 04/11/2024 1048   ALT 27 04/11/2024 1048   ALKPHOS 62 04/11/2024 1048   BILITOT 0.5  04/11/2024 1048   BILITOT 0.4 09/21/2023 0936   GFRNONAA >60 08/03/2024 1026   GFRAA 80 10/29/2020 1333   Lab Results  Component Value Date   CHOL 53 (L) 03/13/2024   HDL 25 (L) 03/13/2024   LDLCALC 10 03/13/2024   LDLDIRECT 93 08/20/2009   TRIG 86 03/13/2024   CHOLHDL 2.1 03/13/2024   Lab Results  Component Value Date   HGBA1C 5.3 08/03/2024   Lab Results  Component Value Date   VITAMINB12 363 01/20/2011   Lab Results  Component Value Date   TSH 1.60 01/26/2024    PHYSICAL EXAM:  Vitals:   01/03/25 1107  BP: 129/68  Pulse: 68   No data found. Body mass index is 29.84 kg/m.   Wt Readings from Last 3 Encounters:  01/03/25 208 lb (94.3 kg)  12/08/24 200 lb (90.7 kg)  12/06/24 196 lb (88.9 kg)     Ht Readings from Last 3 Encounters:  01/03/25 5' 10 (1.778 m)  12/08/24 5' 10 (1.778 m)  12/06/24 5' 10 (1.778 m)      General: The patient is awake, alert and appears not  in acute distress and groomed. Head: Normocephalic, atraumatic.  Neck is supple. Mallampati  Status post UPPP, 2,  neck circumference:18. 25 .  Nasal airflow is sestricted when lying down- , patent while seated.  Retrognathia is not seen.  Cardiovascular:  Regular rate and rhythm , without  murmurs or carotid bruit, and without distended neck veins. Respiratory: Lungs are clear to auscultation. Skin:  Without evidence of ankle edema, or rash. Trunk: BMI is 29    NEUROLOGIC EXAM: The patient is awake and alert, oriented to place and time.   Memory subjective described as intact.  Attention span & concentration ability appears normal.   Speech is fluent,  without  dysarthria, dysphonia or aphasia.  Mood and affect are appropriate.   Neurological Examination: Mental Status: Intact. Language and speech are normal. No cognitive deficits. Cranial Nerves II-XII: Intact. PERL. EOMI. VFF. No nystagmus.  No facial droop.  No ptosis.  Hearing is grossly intact bilaterally.  The tongue is  normal and midline. Motor: Normal tone, muscle bulk and symmetric strength in all extremities.                  Sensory:  Fine touch, pinprick and vibration were normal. Coordination: Rapid alternating movements in the fingers/hands normal without evidence of ataxia, dysmetria or tremor.  ASSESSMENT AND PLAN :   63 y.o. year old male  here with: hypoglossal nerve stimulator for OSA, but with little effect on AHI. Titration revealed only high V settings to improve the apneas and hypopneas, possibly uncomfortably high.   INSPIRE did not control  apnea at the first 8 voltage settings in ascending order - but worked sufficiently at final setting of 1.8 V, which gave best reduction in AHI.   That is the setting we have to work towards week by week .  Slow progression should help avoiding tongue soreness.    RV in 6 months with new settings to 1.8V goal.     Sleep Clinic Patients are generally offered input on sleep hygiene, life style changes and how to improve compliance with medical treatment where applicable. Review and reiteration of good sleep hygiene measures is offered to any sleep clinic patient, be it in the first consultation or with any follow up visits.  Any patient with sleepiness should be cautioned not to drive, work at heights, or operate dangerous or heavy equipment when feeling tired or sleepy.  The patient will be seen in follow-up in the sleep clinic at [facility] for interval evaluations of therapy adherence and discussion of test results, sleep related symptoms and treatment compliance review, further management strategies, etc.   The referring provider will be notified of the test results.   The patient's condition requires frequent monitoring and adjustments in the treatment plan, reflecting the ongoing complexity of care.  This provider is the continuing focal point for all needed services for this condition.  After spending a total time of  22  minutes face to  face and time for  history taking, physical and neurologic examination, review of laboratory studies,  personal review of imaging studies, reports and results of other testing and review of referral information / records as far as provided in visit,   Electronically signed by: Dedra Gores, MD 01/03/2025 2:53 PM  [facility] Board certified by The [employer] of Sleep Medicine and Diplomate of the [employer] of Sleep Medicine. Board certified In Neurology through the ABPN, Fellow of the [employer] of Neurology.      [  1]  Current Outpatient Medications on File Prior to Visit  Medication Sig Dispense Refill   acetaminophen  (TYLENOL ) 500 MG tablet Take 1 tablet (500 mg total) by mouth every 6 (six) hours. Take every 6 hrs x 2-3 days then take every 6 hrs as needed 30 tablet 0   amLODipine  (NORVASC ) 10 MG tablet Take 1 tablet (10 mg total) by mouth daily. 90 tablet 3   aspirin  EC 81 MG tablet Take 1 tablet (81 mg total) by mouth daily. Swallow whole. 90 tablet 2   atorvastatin  (LIPITOR) 40 MG tablet TAKE 1 TABLET(40 MG) BY MOUTH DAILY 90 tablet 3   carvedilol  (COREG ) 25 MG tablet TAKE 1 TABLET(25 MG) BY MOUTH TWICE DAILY 180 tablet 3   cetirizine  (ZYRTEC ) 10 MG tablet Take 10 mg by mouth daily.     clopidogrel  (PLAVIX ) 75 MG tablet Take 1 tablet (75 mg total) by mouth daily. 90 tablet 1   Continuous Glucose Sensor (DEXCOM G7 SENSOR) MISC Change sensor every 10 days 9 each 3   Continuous Glucose Sensor (DEXCOM G7 SENSOR) MISC Change every 10 days     empagliflozin  (JARDIANCE ) 25 MG TABS tablet Take 1 tablet (25 mg total) by mouth daily before breakfast. 90 tablet 3   insulin  glargine (LANTUS  SOLOSTAR) 100 UNIT/ML Solostar Pen Inject 50 Units into the skin daily. (Patient taking differently: Inject 40 Units into the skin at bedtime.) 60 mL 3   insulin  lispro (HUMALOG  KWIKPEN) 200 UNIT/ML KwikPen Max daily 120 units (Patient taking  differently: Inject 36 Units into the skin 3 (three) times daily before meals.) 60 mL 3   Insulin  Pen Needle (UNIFINE PENTIPS) 32G X 4 MM MISC 1 Device by Other route in the morning, at noon, in the evening, and at bedtime. 400 each 3   Insulin  Pen Needle 32G X 4 MM MISC 1 Device by Does not apply route in the morning, at noon, in the evening, and at bedtime. 400 each 3   losartan  (COZAAR ) 100 MG tablet Take 1 tablet (100 mg total) by mouth daily. 90 tablet 3   nitroGLYCERIN  (NITROSTAT ) 0.4 MG SL tablet DISSOLVE 1 TABLET UNDER THE TONGUE EVERY 5 MINUTES AS NEEDED FOR CHEST PAIN 25 tablet 3   pantoprazole  (PROTONIX ) 40 MG tablet Take 1 tablet (40 mg total) by mouth daily. 90 tablet 1   REPATHA  SURECLICK 140 MG/ML SOAJ ADMINISTER 1 ML UNDER THE SKIN EVERY 14 DAYS 6 mL 3   rOPINIRole  (REQUIP ) 2 MG tablet Take 2 mg by mouth at bedtime.     Semaglutide , 2 MG/DOSE, 8 MG/3ML SOPN Inject 2 mg as directed once a week. (Patient taking differently: Inject 2 mg as directed every Saturday.) 9 mL 3   triamcinolone  cream (KENALOG ) 0.1 % Apply 1 Application topically 2 (two) times daily. (Patient taking differently: Apply 1 Application topically 2 (two) times daily as needed (rash).) 30 g 0   vortioxetine  HBr (TRINTELLIX ) 20 MG TABS tablet Take 1 tablet (20 mg total) by mouth daily. 30 tablet 0   No current facility-administered medications on file prior to visit.  [2]  Allergies Allergen Reactions   Pravastatin Other (See Comments)    Joint myalgias   Trazodone And Nefazodone     Had restless legs   Hydrochlorothiazide  Other (See Comments)    lightheadedness   Latex Rash   Metformin  And Related Diarrhea   Zetia  [Ezetimibe ] Other (See Comments)    Myalgias, joint pain   "

## 2025-01-03 NOTE — Telephone Encounter (Signed)
 Inspire rep [place] was here for today inspire follow up.

## 2025-01-05 ENCOUNTER — Other Ambulatory Visit: Payer: Self-pay

## 2025-01-05 ENCOUNTER — Emergency Department (HOSPITAL_COMMUNITY)

## 2025-01-05 ENCOUNTER — Emergency Department (HOSPITAL_COMMUNITY)
Admission: EM | Admit: 2025-01-05 | Discharge: 2025-01-06 | Disposition: A | Attending: Emergency Medicine | Admitting: Emergency Medicine

## 2025-01-05 DIAGNOSIS — Z7982 Long term (current) use of aspirin: Secondary | ICD-10-CM | POA: Insufficient documentation

## 2025-01-05 DIAGNOSIS — R110 Nausea: Secondary | ICD-10-CM | POA: Insufficient documentation

## 2025-01-05 DIAGNOSIS — Z91040 Latex allergy status: Secondary | ICD-10-CM | POA: Insufficient documentation

## 2025-01-05 DIAGNOSIS — Z794 Long term (current) use of insulin: Secondary | ICD-10-CM | POA: Diagnosis not present

## 2025-01-05 DIAGNOSIS — R0602 Shortness of breath: Secondary | ICD-10-CM | POA: Diagnosis not present

## 2025-01-05 DIAGNOSIS — Z79899 Other long term (current) drug therapy: Secondary | ICD-10-CM | POA: Diagnosis not present

## 2025-01-05 DIAGNOSIS — R079 Chest pain, unspecified: Secondary | ICD-10-CM | POA: Insufficient documentation

## 2025-01-05 DIAGNOSIS — I10 Essential (primary) hypertension: Secondary | ICD-10-CM | POA: Diagnosis not present

## 2025-01-05 DIAGNOSIS — I2510 Atherosclerotic heart disease of native coronary artery without angina pectoris: Secondary | ICD-10-CM | POA: Diagnosis not present

## 2025-01-05 LAB — CBC
HCT: 52.2 % — ABNORMAL HIGH (ref 39.0–52.0)
Hemoglobin: 17.4 g/dL — ABNORMAL HIGH (ref 13.0–17.0)
MCH: 30.7 pg (ref 26.0–34.0)
MCHC: 33.3 g/dL (ref 30.0–36.0)
MCV: 92.2 fL (ref 80.0–100.0)
Platelets: 250 K/uL (ref 150–400)
RBC: 5.66 MIL/uL (ref 4.22–5.81)
RDW: 12.7 % (ref 11.5–15.5)
WBC: 10 K/uL (ref 4.0–10.5)
nRBC: 0 % (ref 0.0–0.2)

## 2025-01-05 LAB — TROPONIN T, HIGH SENSITIVITY: Troponin T High Sensitivity: 27 ng/L — ABNORMAL HIGH (ref 0–19)

## 2025-01-05 LAB — BASIC METABOLIC PANEL WITH GFR
Anion gap: 11 (ref 5–15)
BUN: 27 mg/dL — ABNORMAL HIGH (ref 8–23)
CO2: 26 mmol/L (ref 22–32)
Calcium: 10 mg/dL (ref 8.9–10.3)
Chloride: 105 mmol/L (ref 98–111)
Creatinine, Ser: 1.04 mg/dL (ref 0.61–1.24)
GFR, Estimated: >60 mL/min
Glucose, Bld: 135 mg/dL — ABNORMAL HIGH (ref 70–99)
Potassium: 4.2 mmol/L (ref 3.5–5.1)
Sodium: 142 mmol/L (ref 135–145)

## 2025-01-05 LAB — PROTIME-INR
INR: 0.9 (ref 0.8–1.2)
Prothrombin Time: 13.2 s (ref 11.4–15.2)

## 2025-01-05 NOTE — ED Triage Notes (Signed)
 Patient reports central chest pain with SOB and nausea onset today , no cough or diaphoresis .

## 2025-01-06 ENCOUNTER — Other Ambulatory Visit: Payer: Self-pay

## 2025-01-06 LAB — TROPONIN T, HIGH SENSITIVITY: Troponin T High Sensitivity: 21 ng/L — ABNORMAL HIGH (ref 0–19)

## 2025-01-06 MED ORDER — NITROGLYCERIN 0.4 MG SL SUBL
0.4000 mg | SUBLINGUAL_TABLET | SUBLINGUAL | Status: DC | PRN
  Administered 2025-01-06: 0.4 mg via SUBLINGUAL
  Filled 2025-01-06: qty 1

## 2025-01-06 MED ORDER — ASPIRIN 81 MG PO CHEW
324.0000 mg | CHEWABLE_TABLET | Freq: Once | ORAL | Status: AC
  Administered 2025-01-06: 324 mg via ORAL
  Filled 2025-01-06: qty 4

## 2025-01-06 NOTE — Discharge Instructions (Addendum)
 Please monitor your symptoms closely and return the emergency room for any new or concerning symptoms.  Follow-up closely with your cardiologist.  They are aware that you were seen in the emergency room today.

## 2025-01-06 NOTE — ED Notes (Signed)
 RN ambulated pt, pts O2 dropped to 94%. Denies SHOB and chest tightness. PA notified.

## 2025-01-06 NOTE — ED Provider Notes (Signed)
 " [place] EMERGENCY DEPARTMENT AT [facility] Provider Note   CSN: [id number] Arrival date & time: 01/05/25  2144     Patient presents with: Chest Pain   Bennett Ram is a 64 y.o. male.    Chest Pain  Patient is a 64 year old male with past medical history significant for CAD with 3 stents total most recently 1 stent placed 10/2023, A-fib status post Watchman procedure now on dual antiplatelet therapy, anxiety, sleep apnea, HLD, reflux, depression, hypertension.   Patient presents emergency room today with complaints of substernal nonradiating pressure-like chest pain that has been ongoing since yesterday morning he states that it is exertionally worse and he felt short of breath and nausea this with this.  He does not frequently have chest pain he states that this is a rarity.   No recent surgeries, hospitalization, long travel, hemoptysis, estrogen containing OCP, cancer history.  No unilateral leg swelling.  No history of PE or VTE.      Prior to Admission medications  Medication Sig Start Date End Date Taking? Authorizing Provider  acetaminophen  (TYLENOL ) 500 MG tablet Take 1 tablet (500 mg total) by mouth every 6 (six) hours. Take every 6 hrs x 2-3 days then take every 6 hrs as needed 08/09/24   Soldatova, Liuba, MD  amLODipine  (NORVASC ) 10 MG tablet Take 1 tablet (10 mg total) by mouth daily. 12/08/24   Wonda Sharper, MD  aspirin  EC 81 MG tablet Take 1 tablet (81 mg total) by mouth daily. Swallow whole. 08/01/24   Wonda Sharper, MD  atorvastatin  (LIPITOR) 40 MG tablet TAKE 1 TABLET(40 MG) BY MOUTH DAILY 12/08/24   Wonda Sharper, MD  carvedilol  (COREG ) 25 MG tablet TAKE 1 TABLET(25 MG) BY MOUTH TWICE DAILY 06/02/24   Lelon Hamilton T, PA-C  cetirizine  (ZYRTEC ) 10 MG tablet Take 10 mg by mouth daily.    [provider]  clopidogrel  (PLAVIX ) 75 MG tablet Take 1 tablet (75 mg total) by mouth daily. 10/31/24   Lesia Sharper Barter, PA-C  Continuous Glucose  Sensor (DEXCOM G7 SENSOR) MISC Change sensor every 10 days 02/07/24   Shamleffer, Ibtehal Jaralla, MD  Continuous Glucose Sensor (DEXCOM G7 SENSOR) MISC Change every 10 days 08/29/24   Shamleffer, Ibtehal Jaralla, MD  empagliflozin  (JARDIANCE ) 25 MG TABS tablet Take 1 tablet (25 mg total) by mouth daily before breakfast. 03/30/24   Shamleffer, Ibtehal Jaralla, MD  insulin  glargine (LANTUS  SOLOSTAR) 100 UNIT/ML Solostar Pen Inject 50 Units into the skin daily. Patient taking differently: Inject 40 Units into the skin at bedtime. 05/01/24   Shamleffer, Ibtehal Jaralla, MD  insulin  lispro (HUMALOG  KWIKPEN) 200 UNIT/ML KwikPen Max daily 120 units Patient taking differently: Inject 36 Units into the skin 3 (three) times daily before meals. 05/01/24   Shamleffer, Ibtehal Jaralla, MD  Insulin  Pen Needle (UNIFINE PENTIPS) 32G X 4 MM MISC 1 Device by Other route in the morning, at noon, in the evening, and at bedtime. 05/01/24   Shamleffer, Ibtehal Jaralla, MD  Insulin  Pen Needle 32G X 4 MM MISC 1 Device by Does not apply route in the morning, at noon, in the evening, and at bedtime. 01/26/24   Shamleffer, Ibtehal Jaralla, MD  losartan  (COZAAR ) 100 MG tablet Take 1 tablet (100 mg total) by mouth daily. 05/26/24 01/03/25  Kennyth Chew, MD  nitroGLYCERIN  (NITROSTAT ) 0.4 MG SL tablet DISSOLVE 1 TABLET UNDER THE TONGUE EVERY 5 MINUTES AS NEEDED FOR CHEST PAIN 11/28/24   Wonda Sharper, MD  pantoprazole  (PROTONIX ) 40  MG tablet Take 1 tablet (40 mg total) by mouth daily. 11/03/24   Shelah Lamar RAMAN, MD  REPATHA  SURECLICK 140 MG/ML SOAJ ADMINISTER 1 ML UNDER THE SKIN EVERY 14 DAYS 12/25/24   Wonda Sharper, MD  rOPINIRole  (REQUIP ) 2 MG tablet Take 2 mg by mouth at bedtime.    [provider]  Semaglutide , 2 MG/DOSE, 8 MG/3ML SOPN Inject 2 mg as directed once a week. Patient taking differently: Inject 2 mg as directed every Saturday. 05/01/24   Shamleffer, Ibtehal Jaralla, MD  triamcinolone  cream (KENALOG ) 0.1 % Apply 1  Application topically 2 (two) times daily. Patient taking differently: Apply 1 Application topically 2 (two) times daily as needed (rash). 10/22/23   Jolaine Pac, DO  vortioxetine  HBr (TRINTELLIX ) 20 MG TABS tablet Take 1 tablet (20 mg total) by mouth daily. 03/02/24   Jolaine Pac, DO    Allergies: Pravastatin, Trazodone and nefazodone, Hydrochlorothiazide , Latex, Metformin  and related, and Zetia  [ezetimibe ]    Review of Systems  Cardiovascular:  Positive for chest pain.    Updated Vital Signs BP 129/77   Pulse 73   Temp 98.3 F (36.8 C) (Oral)   Resp 19   SpO2 95%   Physical Exam Vitals and nursing note reviewed.  Constitutional:      General: He is not in acute distress. HENT:     Head: Normocephalic and atraumatic.     Nose: Nose normal.     Mouth/Throat:     Mouth: Mucous membranes are dry.  Eyes:     General: No scleral icterus. Cardiovascular:     Rate and Rhythm: Normal rate and regular rhythm.     Pulses: Normal pulses.     Heart sounds: Normal heart sounds.  Pulmonary:     Effort: Pulmonary effort is normal. No respiratory distress.     Breath sounds: No wheezing.  Abdominal:     Palpations: Abdomen is soft.     Tenderness: There is no abdominal tenderness. There is no guarding or rebound.  Musculoskeletal:     Cervical back: Normal range of motion.     Right lower leg: No edema.     Left lower leg: No edema.     Comments: Lower extremities are symmetric, bilateral strength symmetric  Skin:    General: Skin is warm and dry.     Capillary Refill: Capillary refill takes less than 2 seconds.  Neurological:     Mental Status: He is alert. Mental status is at baseline.  Psychiatric:        Mood and Affect: Mood normal.        Behavior: Behavior normal.     (all labs ordered are listed, but only abnormal results are displayed) Labs Reviewed  BASIC METABOLIC PANEL WITH GFR - Abnormal; Notable for the following components:      Result Value    Glucose, Bld 135 (*)    BUN 27 (*)    All other components within normal limits  CBC - Abnormal; Notable for the following components:   Hemoglobin 17.4 (*)    HCT 52.2 (*)    All other components within normal limits  TROPONIN T, HIGH SENSITIVITY - Abnormal; Notable for the following components:   Troponin T High Sensitivity 27 (*)    All other components within normal limits  TROPONIN T, HIGH SENSITIVITY - Abnormal; Notable for the following components:   Troponin T High Sensitivity 21 (*)    All other components within normal limits  PROTIME-INR  EKG: None  Radiology: DG Chest 2 View Result Date: 01/05/2025 CLINICAL DATA:  Central chest pain, short of breath, nausea EXAM: CHEST - 2 VIEW COMPARISON:  08/09/2024 FINDINGS: Frontal and lateral views of the chest demonstrate nerve stimulator within the right chest wall, lead extending cephalad to the submandibular region. Cardiac silhouette is unremarkable. Occlusion device seen within the left atrial appendage. No acute airspace disease, effusion, or pneumothorax. No acute bony abnormalities. IMPRESSION: 1. No acute intrathoracic process. Electronically Signed   By: Ozell Daring M.D.   On: 01/05/2025 22:19     Procedures   Medications Ordered in the ED  nitroGLYCERIN  (NITROSTAT ) SL tablet 0.4 mg (0.4 mg Sublingual Given 01/06/25 0818)  aspirin  chewable tablet 324 mg (324 mg Oral Given 01/06/25 0818)    Clinical Course as of 01/06/25 0924  Sat Jan 06, 2025  0754 Woke up yesterday feeling off.  SOB,  [WF]  0803 Stents x3 (two times) 2 from MI 1 in November 24 [WF]  0900 Discussed with Dr. Delford of cardiology [WF]    Clinical Course User Index [WF] Neldon Hamp RAMAN, PA                                 Medical Decision Making Amount and/or Complexity of Data Reviewed Labs: ordered. Radiology: ordered.  Risk OTC drugs. Prescription drug management.    This patient presents to the ED for concern of CP, this involves  a number of treatment options, and is a complaint that carries with it a moderate risk of complications and morbidity. A differential diagnosis was considered for the patient's symptoms which is discussed below:   The emergent causes of chest pain include: Acute coronary syndrome, tamponade, pericarditis/myocarditis, aortic dissection, pulmonary embolism, tension pneumothorax, pneumonia, and esophageal rupture.   I do not believe the patient has an emergent cause of chest pain, other urgent/non-acute considerations include, but are not limited to: chronic angina, aortic stenosis, cardiomyopathy, mitral valve prolapse, pulmonary hypertension, aortic insufficiency, right ventricular hypertrophy, pleuritis, bronchitis, pneumothorax, tumor, gastroesophageal reflux disease (GERD), esophageal spasm, Mallory-Weiss syndrome, peptic ulcer disease, pancreatitis, functional gastrointestinal pain, cervical or thoracic disk disease or arthritis, shoulder arthritis, costochondritis, subacromial bursitis, anxiety or panic attack, herpes zoster, breast disorders, chest wall tumors, thoracic outlet syndrome, mediastinitis.    Co morbidities: Discussed in HPI   Brief History:  Patient is a 64 year old male with past medical history significant for CAD with 3 stents total most recently 1 stent placed 10/2023, A-fib status post Watchman procedure now on dual antiplatelet therapy, anxiety, sleep apnea, HLD, reflux, depression, hypertension  Patient presents emergency room today with complaints of substernal nonradiating pressure-like chest pain that has been ongoing since yesterday morning he states that it is exertionally worse and he felt short of breath and nausea this with this.  He does not frequently have chest pain he states that this is a rarity.  No recent surgeries, hospitalization, long travel, hemoptysis, estrogen containing OCP, cancer history.  No unilateral leg swelling.  No history of PE or VTE.      EMR reviewed including pt PMHx, past surgical history and past visits to ER.   See HPI for more details   Lab Tests:  I ordered and independently interpreted labs. Labs notable for Troponin 27> 21, coags normal, BNP normal, CBC with mildly elevated hemoglobin similar to previous.  Imaging Studies:  NAD. I personally reviewed all imaging studies and no  acute abnormality found. I agree with radiology interpretation.    Cardiac Monitoring:  The patient was maintained on a cardiac monitor.  I personally viewed and interpreted the cardiac monitored which showed an underlying rhythm of: NSR EKG non-ischemic   Medicines ordered:  I ordered medication including aspirin , nitroglycerin  for chest pain Reevaluation of the patient after these medicines showed that the patient resolved I have reviewed the patients home medicines and have made adjustments as needed   Critical Interventions:     Consults/Attending Physician   I requested consultation with Dr. Delford of cardiology,  and discussed lab and imaging findings as well as pertinent plan - they recommend: Close outpatient follow-up, strict return precautions   Reevaluation:  After the interventions noted above I re-evaluated patient and found that they have :resolved   Social Determinants of Health:  The patient's social determinants of health were a factor in the care of this patient    Problem List / ED Course:  Patient is a 64 year old male with a past medical history significant for significant CAD requiring 3 stents most recently had 1 stent placed 10/2023 here with some exertional chest pain and dyspnea and nausea and shortness of breath he informs me that his chest pain is 2/10 however completely resolved with 1 dose of nitroglycerin .  I discussed the case with our cardiologist at [facility] in part because this patient is requesting he be discharged if he is not actively having a heart attack.  Cardiology  recommends strict return precautions but was agreeable to patient being discharged home and following up outpatient.  They will arrange for outpatient follow-up, I recommended that patient call clinic Monday to help facilitate this close follow-up.  Strict return precautions to emergency room provided to patient and he is agreeable to plan.   Dispostion:  After consideration of the diagnostic results and the patients response to treatment, I feel that the patent would benefit from outpatient follow-up.      Final diagnoses:  Chest pain, unspecified type    ED Discharge Orders     None          Neldon Inoue [place], [place] 01/06/25 9075    Armenta Canning, MD 01/06/25 [phone]  "

## 2025-01-08 ENCOUNTER — Other Ambulatory Visit: Payer: Self-pay | Admitting: Internal Medicine

## 2025-01-12 ENCOUNTER — Telehealth: Payer: Self-pay | Admitting: Internal Medicine

## 2025-01-12 NOTE — Telephone Encounter (Signed)
"  Patient needs PA for Ozempic   "

## 2025-01-16 ENCOUNTER — Other Ambulatory Visit (HOSPITAL_COMMUNITY): Payer: Self-pay

## 2025-01-16 ENCOUNTER — Telehealth: Payer: Self-pay

## 2025-01-16 NOTE — Telephone Encounter (Signed)
 Pharmacy Patient Advocate Encounter   Received notification from Pt Calls Messages that prior authorization for Ozempic  (2 MG/DOSE) 8MG /3ML pen-injectors is required/requested.   Insurance verification completed.   The patient is insured through [facility].   Per test claim: PA required; PA submitted to above mentioned insurance via Latent Key/confirmation #/EOC [id number] Status is pending

## 2025-01-16 NOTE — Telephone Encounter (Signed)
 Patient is following up on ozempic  prior auth

## 2025-01-16 NOTE — Telephone Encounter (Signed)
 Pharmacy Patient Advocate Encounter  Received notification from [employer] that Prior Authorization for Ozempic  has been APPROVED from 01/16/2025 to 01/16/2026   PA #/Case ID/Reference #: [id number]

## 2025-02-05 ENCOUNTER — Ambulatory Visit (INDEPENDENT_AMBULATORY_CARE_PROVIDER_SITE_OTHER)

## 2025-02-09 ENCOUNTER — Ambulatory Visit: Admitting: Emergency Medicine

## 2025-03-05 ENCOUNTER — Ambulatory Visit: Admitting: Internal Medicine

## 2025-03-14 ENCOUNTER — Ambulatory Visit: Admitting: Student

## 2025-03-21 ENCOUNTER — Ambulatory Visit: Admitting: Neurology

## 2025-06-05 ENCOUNTER — Other Ambulatory Visit
